# Patient Record
Sex: Female | Born: 1955 | Race: Black or African American | Hispanic: No | Smoking: Former smoker
Health system: Southern US, Community
[De-identification: ages and names within clinical notes are randomized; demographics above are authoritative.]

## PROBLEM LIST (undated history)

## (undated) DIAGNOSIS — M545 Low back pain, unspecified: Secondary | ICD-10-CM

## (undated) DIAGNOSIS — I428 Other cardiomyopathies: Secondary | ICD-10-CM

## (undated) DIAGNOSIS — I447 Left bundle-branch block, unspecified: Secondary | ICD-10-CM

## (undated) DIAGNOSIS — U071 COVID-19: Secondary | ICD-10-CM

## (undated) DIAGNOSIS — E119 Type 2 diabetes mellitus without complications: Secondary | ICD-10-CM

## (undated) DIAGNOSIS — I350 Nonrheumatic aortic (valve) stenosis: Principal | ICD-10-CM

## (undated) DIAGNOSIS — C50919 Malignant neoplasm of unspecified site of unspecified female breast: Secondary | ICD-10-CM

## (undated) DIAGNOSIS — F419 Anxiety disorder, unspecified: Secondary | ICD-10-CM

## (undated) DIAGNOSIS — G5600 Carpal tunnel syndrome, unspecified upper limb: Secondary | ICD-10-CM

## (undated) DIAGNOSIS — G629 Polyneuropathy, unspecified: Secondary | ICD-10-CM

## (undated) DIAGNOSIS — R7303 Prediabetes: Secondary | ICD-10-CM

## (undated) DIAGNOSIS — G4730 Sleep apnea, unspecified: Secondary | ICD-10-CM

## (undated) DIAGNOSIS — H269 Unspecified cataract: Secondary | ICD-10-CM

## (undated) DIAGNOSIS — I10 Essential (primary) hypertension: Secondary | ICD-10-CM

## (undated) DIAGNOSIS — I5032 Chronic diastolic (congestive) heart failure: Secondary | ICD-10-CM

## (undated) DIAGNOSIS — G4733 Obstructive sleep apnea (adult) (pediatric): Secondary | ICD-10-CM

## (undated) DIAGNOSIS — Z9989 Dependence on other enabling machines and devices: Secondary | ICD-10-CM

## (undated) DIAGNOSIS — R011 Cardiac murmur, unspecified: Secondary | ICD-10-CM

## (undated) DIAGNOSIS — J189 Pneumonia, unspecified organism: Secondary | ICD-10-CM

## (undated) DIAGNOSIS — M1990 Unspecified osteoarthritis, unspecified site: Secondary | ICD-10-CM

## (undated) DIAGNOSIS — E785 Hyperlipidemia, unspecified: Secondary | ICD-10-CM

## (undated) DIAGNOSIS — I493 Ventricular premature depolarization: Secondary | ICD-10-CM

## (undated) DIAGNOSIS — I272 Pulmonary hypertension, unspecified: Secondary | ICD-10-CM

## (undated) HISTORY — DX: Left bundle-branch block, unspecified: I44.7

## (undated) HISTORY — PX: OTHER SURGICAL HISTORY: SHX169

## (undated) HISTORY — DX: Ventricular premature depolarization: I49.3

## (undated) HISTORY — DX: Chronic diastolic (congestive) heart failure: I50.32

## (undated) HISTORY — DX: Nonrheumatic aortic (valve) stenosis: I35.0

## (undated) HISTORY — DX: Anxiety disorder, unspecified: F41.9

## (undated) HISTORY — PX: UTERINE FIBROID SURGERY: SHX826

## (undated) HISTORY — DX: COVID-19: U07.1

## (undated) HISTORY — DX: Malignant neoplasm of unspecified site of unspecified female breast: C50.919

## (undated) HISTORY — DX: Obstructive sleep apnea (adult) (pediatric): G47.33

## (undated) HISTORY — PX: CATARACT EXTRACTION W/ INTRAOCULAR LENS  IMPLANT, BILATERAL: SHX1307

## (undated) HISTORY — DX: Low back pain: M54.5

## (undated) HISTORY — DX: Sleep apnea, unspecified: G47.30

## (undated) HISTORY — PX: POLYPECTOMY: SHX149

## (undated) HISTORY — DX: Low back pain, unspecified: M54.50

## (undated) HISTORY — PX: BREAST SURGERY: SHX581

## (undated) HISTORY — DX: Type 2 diabetes mellitus without complications: E11.9

## (undated) HISTORY — DX: Pulmonary hypertension, unspecified: I27.20

## (undated) HISTORY — DX: Hyperlipidemia, unspecified: E78.5

## (undated) HISTORY — DX: Unspecified osteoarthritis, unspecified site: M19.90

## (undated) HISTORY — PX: DILATION AND CURETTAGE OF UTERUS: SHX78

## (undated) HISTORY — DX: Carpal tunnel syndrome, unspecified upper limb: G56.00

## (undated) HISTORY — DX: Prediabetes: R73.03

## (undated) HISTORY — DX: Unspecified cataract: H26.9

## (undated) HISTORY — DX: Polyneuropathy, unspecified: G62.9

## (undated) HISTORY — DX: Dependence on other enabling machines and devices: Z99.89

## (undated) HISTORY — DX: Essential (primary) hypertension: I10

---

## 1994-02-14 DIAGNOSIS — C50919 Malignant neoplasm of unspecified site of unspecified female breast: Secondary | ICD-10-CM

## 1994-02-14 HISTORY — DX: Malignant neoplasm of unspecified site of unspecified female breast: C50.919

## 1999-07-09 ENCOUNTER — Ambulatory Visit (HOSPITAL_COMMUNITY): Admission: RE | Admit: 1999-07-09 | Discharge: 1999-07-09 | Payer: Self-pay | Admitting: Family Medicine

## 1999-07-09 ENCOUNTER — Encounter: Payer: Self-pay | Admitting: Family Medicine

## 2000-06-02 ENCOUNTER — Ambulatory Visit (HOSPITAL_BASED_OUTPATIENT_CLINIC_OR_DEPARTMENT_OTHER): Admission: RE | Admit: 2000-06-02 | Discharge: 2000-06-02 | Payer: Self-pay | Admitting: Family Medicine

## 2001-10-25 ENCOUNTER — Emergency Department (HOSPITAL_COMMUNITY): Admission: EM | Admit: 2001-10-25 | Discharge: 2001-10-25 | Payer: Self-pay | Admitting: *Deleted

## 2001-10-25 ENCOUNTER — Encounter: Payer: Self-pay | Admitting: *Deleted

## 2002-12-13 ENCOUNTER — Encounter (INDEPENDENT_AMBULATORY_CARE_PROVIDER_SITE_OTHER): Payer: Self-pay | Admitting: *Deleted

## 2002-12-13 ENCOUNTER — Ambulatory Visit (HOSPITAL_COMMUNITY): Admission: AD | Admit: 2002-12-13 | Discharge: 2002-12-13 | Payer: Self-pay | Admitting: *Deleted

## 2004-02-26 ENCOUNTER — Ambulatory Visit: Payer: Self-pay | Admitting: Internal Medicine

## 2004-03-11 ENCOUNTER — Ambulatory Visit: Payer: Self-pay | Admitting: Internal Medicine

## 2004-03-22 ENCOUNTER — Encounter
Admission: RE | Admit: 2004-03-22 | Discharge: 2004-06-20 | Payer: Self-pay | Admitting: Physical Medicine & Rehabilitation

## 2004-03-22 ENCOUNTER — Ambulatory Visit: Payer: Self-pay | Admitting: Physical Medicine & Rehabilitation

## 2004-05-13 ENCOUNTER — Encounter: Admission: RE | Admit: 2004-05-13 | Discharge: 2004-05-13 | Payer: Self-pay | Admitting: Specialist

## 2004-07-22 ENCOUNTER — Encounter: Admission: RE | Admit: 2004-07-22 | Discharge: 2004-07-22 | Payer: Self-pay | Admitting: Specialist

## 2004-08-02 ENCOUNTER — Encounter: Admission: RE | Admit: 2004-08-02 | Discharge: 2004-08-02 | Payer: Self-pay | Admitting: Specialist

## 2004-08-06 ENCOUNTER — Encounter: Admission: RE | Admit: 2004-08-06 | Discharge: 2004-08-06 | Payer: Self-pay | Admitting: Specialist

## 2005-04-11 ENCOUNTER — Encounter
Admission: RE | Admit: 2005-04-11 | Discharge: 2005-07-10 | Payer: Self-pay | Admitting: Physical Medicine & Rehabilitation

## 2005-04-11 ENCOUNTER — Ambulatory Visit: Payer: Self-pay | Admitting: Physical Medicine & Rehabilitation

## 2005-06-15 ENCOUNTER — Ambulatory Visit: Payer: Self-pay | Admitting: Physical Medicine & Rehabilitation

## 2005-07-27 ENCOUNTER — Encounter
Admission: RE | Admit: 2005-07-27 | Discharge: 2005-10-25 | Payer: Self-pay | Admitting: Physical Medicine & Rehabilitation

## 2005-07-27 ENCOUNTER — Ambulatory Visit: Payer: Self-pay | Admitting: Physical Medicine & Rehabilitation

## 2005-10-04 ENCOUNTER — Ambulatory Visit: Payer: Self-pay | Admitting: Physical Medicine & Rehabilitation

## 2005-11-04 ENCOUNTER — Ambulatory Visit: Payer: Self-pay | Admitting: Physical Medicine & Rehabilitation

## 2005-11-04 ENCOUNTER — Encounter
Admission: RE | Admit: 2005-11-04 | Discharge: 2006-02-02 | Payer: Self-pay | Admitting: Physical Medicine & Rehabilitation

## 2006-02-24 ENCOUNTER — Encounter
Admission: RE | Admit: 2006-02-24 | Discharge: 2006-05-25 | Payer: Self-pay | Admitting: Physical Medicine & Rehabilitation

## 2006-03-06 ENCOUNTER — Ambulatory Visit: Payer: Self-pay | Admitting: Physical Medicine & Rehabilitation

## 2006-04-17 ENCOUNTER — Ambulatory Visit: Payer: Self-pay | Admitting: Physical Medicine & Rehabilitation

## 2006-06-16 ENCOUNTER — Encounter
Admission: RE | Admit: 2006-06-16 | Discharge: 2006-09-14 | Payer: Self-pay | Admitting: Physical Medicine & Rehabilitation

## 2006-06-19 ENCOUNTER — Ambulatory Visit: Payer: Self-pay | Admitting: Physical Medicine & Rehabilitation

## 2006-08-01 ENCOUNTER — Ambulatory Visit (HOSPITAL_COMMUNITY)
Admission: RE | Admit: 2006-08-01 | Discharge: 2006-08-01 | Payer: Self-pay | Admitting: Physical Medicine & Rehabilitation

## 2006-08-07 ENCOUNTER — Ambulatory Visit: Payer: Self-pay | Admitting: Physical Medicine & Rehabilitation

## 2007-04-02 ENCOUNTER — Encounter
Admission: RE | Admit: 2007-04-02 | Discharge: 2007-07-01 | Payer: Self-pay | Admitting: Physical Medicine & Rehabilitation

## 2007-04-02 ENCOUNTER — Ambulatory Visit: Payer: Self-pay | Admitting: Physical Medicine & Rehabilitation

## 2007-05-29 ENCOUNTER — Ambulatory Visit: Payer: Self-pay | Admitting: Physical Medicine & Rehabilitation

## 2007-07-27 ENCOUNTER — Encounter
Admission: RE | Admit: 2007-07-27 | Discharge: 2007-08-28 | Payer: Self-pay | Admitting: Physical Medicine & Rehabilitation

## 2007-07-30 ENCOUNTER — Ambulatory Visit: Payer: Self-pay | Admitting: Physical Medicine & Rehabilitation

## 2007-08-20 ENCOUNTER — Ambulatory Visit (HOSPITAL_COMMUNITY)
Admission: RE | Admit: 2007-08-20 | Discharge: 2007-08-20 | Payer: Self-pay | Admitting: Physical Medicine & Rehabilitation

## 2007-08-28 ENCOUNTER — Ambulatory Visit: Payer: Self-pay | Admitting: Physical Medicine & Rehabilitation

## 2007-10-19 ENCOUNTER — Encounter
Admission: RE | Admit: 2007-10-19 | Discharge: 2007-10-23 | Payer: Self-pay | Admitting: Physical Medicine & Rehabilitation

## 2007-10-23 ENCOUNTER — Ambulatory Visit: Payer: Self-pay | Admitting: Physical Medicine & Rehabilitation

## 2007-12-24 ENCOUNTER — Encounter
Admission: RE | Admit: 2007-12-24 | Discharge: 2008-03-23 | Payer: Self-pay | Admitting: Physical Medicine & Rehabilitation

## 2007-12-24 ENCOUNTER — Ambulatory Visit: Payer: Self-pay | Admitting: Physical Medicine & Rehabilitation

## 2008-01-21 ENCOUNTER — Ambulatory Visit: Payer: Self-pay | Admitting: Physical Medicine & Rehabilitation

## 2010-02-16 ENCOUNTER — Encounter (INDEPENDENT_AMBULATORY_CARE_PROVIDER_SITE_OTHER): Payer: Self-pay | Admitting: *Deleted

## 2010-03-03 ENCOUNTER — Encounter (INDEPENDENT_AMBULATORY_CARE_PROVIDER_SITE_OTHER): Payer: Self-pay | Admitting: *Deleted

## 2010-03-07 ENCOUNTER — Encounter: Payer: Self-pay | Admitting: Family Medicine

## 2010-03-18 NOTE — Letter (Signed)
Summary: New Patient letter  [facility]  [address] [place], [place] [address]   Phone: [phone]  Fax: [phone]       03/03/2010 MRN: [id number]  [facility] [address] [place], [place]  [address]  Dear [name],  Welcome to the Gastroenterology Division at [facility].    You are scheduled to see Dr.  Lina Sar on April 19, 2010 at 9:15am on the 3rd floor at [facility], [address]  We ask that you try to arrive at our office 15 minutes prior to your appointment time to allow for check-in.  We would like you to complete the enclosed self-administered evaluation form prior to your visit and bring it with you on the day of your appointment.  We will review it with you.  Also, please bring a complete list of all your medications or, if you prefer, bring the medication bottles and we will list them.  Please bring your insurance card so that we may make a copy of it.  If your insurance requires a referral to see a specialist, please bring your referral form from your primary care physician.  Co-payments are due at the time of your visit and may be paid by cash, check or credit card.     Your office visit will consist of a consult with your physician (includes a physical exam), any laboratory testing he/she may order, scheduling of any necessary diagnostic testing (e.g. x-ray, ultrasound, CT-scan), and scheduling of a procedure (e.g. Endoscopy, Colonoscopy) if required.  Please allow enough time on your schedule to allow for any/all of these possibilities.    If you cannot keep your appointment, please call [phone] to cancel or reschedule prior to your appointment date.  This allows [place] the opportunity to schedule an appointment for another patient in need of care.  If you do not cancel or reschedule by 5 p.m. the business day prior to your appointment date, you will be charged a $50.00 late cancellation/no-show fee.    Thank you for choosing  [facility] for your medical needs.  We appreciate the opportunity to care for you.  Please visit [place] at our website  to learn more about our practice.                     Sincerely,                                                             The Gastroenterology Division

## 2010-03-18 NOTE — Letter (Signed)
Summary: Office Visit Letter  [facility]  [address] [place], [place] [address]   Phone: [phone]  Fax: [phone]      February 16, 2010 MRN: [id number]   [facility] [address] [place], [place]  [address]   Dear Ms. Mcmains,   According to our records, it is time for you to schedule a follow-up office visit with [place].   At your convenience, please call [phone] (option #2)to schedule an office visit. If you have any questions, concerns, or feel that this letter is in error, we would appreciate your call.   Sincerely,  Hedwig Morton. Juanda Chance, M.D  [facility] Division [phone]

## 2010-04-15 ENCOUNTER — Telehealth: Payer: Self-pay | Admitting: Internal Medicine

## 2010-04-15 DIAGNOSIS — Z853 Personal history of malignant neoplasm of breast: Secondary | ICD-10-CM | POA: Insufficient documentation

## 2010-04-15 DIAGNOSIS — E7800 Pure hypercholesterolemia, unspecified: Secondary | ICD-10-CM | POA: Insufficient documentation

## 2010-04-15 DIAGNOSIS — G5600 Carpal tunnel syndrome, unspecified upper limb: Secondary | ICD-10-CM | POA: Insufficient documentation

## 2010-04-15 DIAGNOSIS — E782 Mixed hyperlipidemia: Secondary | ICD-10-CM | POA: Insufficient documentation

## 2010-04-16 ENCOUNTER — Encounter (INDEPENDENT_AMBULATORY_CARE_PROVIDER_SITE_OTHER): Payer: Self-pay | Admitting: *Deleted

## 2010-04-19 ENCOUNTER — Encounter: Payer: Self-pay | Admitting: Internal Medicine

## 2010-04-19 ENCOUNTER — Encounter: Payer: Self-pay | Admitting: *Deleted

## 2010-04-19 ENCOUNTER — Ambulatory Visit: Payer: Self-pay | Admitting: Internal Medicine

## 2010-04-22 NOTE — Progress Notes (Signed)
Summary: Direct Colon Vs. Office Visit  Phone Note Outgoing Call   Call placed by: Lamona Curl CMA Duncan Dull),  April 15, 2010 5:02 PM Call placed to: Patient Summary of Call: Called patient. She is on schedule for 04/19/10 to see Dr Juanda Chance for recall colonoscopy. Patient is 55 years old and has no major medical problems. She is on no anticoagulants, no insulin and she has no gi symptoms. Patient is coming because she is due for colonoscopy (her last one was in 2006) as her mother had colon cancer. Per Dr Juanda Chance, patient may be a direct colonoscopy unless she just wants to be seen in the office first. Patient would rather have direct procedure. Patient has been scheduled for previsit on 04/19/10 and colonoscopy on 05/18/10. Office appointment for 04/19/10 with Dr Juanda Chance has been cancelled. Initial call taken by: Lamona Curl CMA (AAMA),  April 15, 2010 5:04 PM

## 2010-04-22 NOTE — Procedures (Signed)
Summary: COLON   Colonoscopy  Procedure date:  03/11/2004  Findings:      Location:  [facility].   Patient Name: Joanna, Peck MRN:  Procedure Procedures: Colonoscopy CPT: [id number].  Personnel: Endoscopist: Dora L. Juanda Chance, MD.  Referred By: Elias Else, MD.  Exam Location: Exam performed in Outpatient Clinic. Outpatient  Patient Consent: Procedure, Alternatives, Risks and Benefits discussed, consent obtained, from patient. Consent was obtained by the RN.  Indications  Average Risk Screening Routine.  History  Current Medications: Patient is not currently taking Coumadin.  Pre-Exam Physical: Performed Mar 11, 2004. Entire physical exam was normal.  Exam Exam: Extent of exam reached: Cecum, extent intended: Cecum.  The cecum was identified by appendiceal orifice and IC valve. Colon retroflexion performed. Images taken. ASA Classification: I. Tolerance: good.  Monitoring: Pulse and BP monitoring, Oximetry used. Supplemental O2 given.  Colon Prep Used Miralax for colon prep. Prep results: good.  Sedation Meds: Patient assessed and found to be appropriate for moderate (conscious) sedation. Fentanyl 100 mcg. given IV. Versed 10 mg. given IV.  Findings - NORMAL EXAM: Cecum.   Assessment Normal examination.  Comments: no polyps Events  Unplanned Interventions: No intervention was required.  Unplanned Events: There were no complications. Plans Patient Education: Patient given standard instructions for: Yearly hemoccult testing recommended. Patient instructed to get routine colonoscopy every 10 years.  Disposition: After procedure patient sent to recovery. After recovery patient sent home.   This report was created from the original endoscopy report, which was reviewed and signed by the above listed endoscopist.

## 2010-04-27 NOTE — Miscellaneous (Signed)
Summary: [facility]  Clinical Lists Changes  Medications: Added new medication of MOVIPREP 100 GM  SOLR (PEG-KCL-NACL-NASULF-NA ASC-C) As per prep instructions. - Signed Rx of MOVIPREP 100 GM  SOLR (PEG-KCL-NACL-NASULF-NA ASC-C) As per prep instructions.;  #1 x 0;  Signed;  Entered by: Ezra Sites RN;  Authorized by: Hart Carwin MD;  Method used: Electronically to [facility] [address] [place], [place], [place]  [address], Ph: [phone], Fax: [phone] Allergies: Changed allergy or adverse reaction from PRAVACHOL to PRAVACHOL    Prescriptions: MOVIPREP 100 GM  SOLR (PEG-KCL-NACL-NASULF-NA ASC-C) As per prep instructions.  #1 x 0   Entered by:   Ezra Sites RN   Authorized by:   Hart Carwin MD   Signed by:   Ezra Sites RN on 04/19/2010   Method used:   Electronically to        [facility] (retail)       [address]       [place], [place]  [address]       Ph: [phone]       Fax: [phone]   RxID:   [id number]

## 2010-04-27 NOTE — Letter (Signed)
Summary: [facility] Instructions  [facility]  [address] [place], [place] [address]   Phone: [phone]  Fax: [phone]       [name]    05/03/1955    MRN: [id number]        Procedure Day /Date:  Thursday 05/20/2010     Arrival Time: 10:00 am     Procedure Time: 11:00 am     Location of Procedure:                    _x _  [facility] (4th Floor)   PREPARATION FOR COLONOSCOPY WITH MOVIPREP   Starting 5 days prior to your procedure March 31 Saturday do not eat nuts, seeds, popcorn, corn, beans, peas,  salads, or any raw vegetables.  Do not take any fiber supplements (e.g. Metamucil, Citrucel, and Benefiber).  THE DAY BEFORE YOUR PROCEDURE         DATE: Wednesday 4/4 1.  Drink clear liquids the entire day-NO SOLID FOOD  2.  Do not drink anything colored red or purple.  Avoid juices with pulp.  No orange juice.  3.  Drink at least 64 oz. (8 glasses) of fluid/clear liquids during the day to prevent dehydration and help the prep work efficiently.  CLEAR LIQUIDS INCLUDE: Water Jello Ice Popsicles Tea (sugar ok, no milk/cream) Powdered fruit flavored drinks Coffee (sugar ok, no milk/cream) Gatorade Juice: apple, white grape, white cranberry  Lemonade Clear bullion, consomm, broth Carbonated beverages (any kind) Strained chicken noodle soup Hard Candy                             4.  In the morning, mix first dose of MoviPrep solution:    Empty 1 Pouch A and 1 Pouch B into the disposable container    Add lukewarm drinking water to the top line of the container. Mix to dissolve    Refrigerate (mixed solution should be used within 24 hrs)  5.  Begin drinking the prep at 5:00 p.m. The MoviPrep container is divided by 4 marks.   Every 15 minutes drink the solution down to the next mark (approximately 8 oz) until the full liter is complete.   6.  Follow completed prep with 16 oz of clear liquid of your choice (Nothing red or purple).   Continue to drink clear liquids until bedtime.  7.  Before going to bed, mix second dose of MoviPrep solution:    Empty 1 Pouch A and 1 Pouch B into the disposable container    Add lukewarm drinking water to the top line of the container. Mix to dissolve    Refrigerate  THE DAY OF YOUR PROCEDURE      DATE: Thursday 4/5  Beginning at 6:00 a.m. (5 hours before procedure):         1. Every 15 minutes, drink the solution down to the next mark (approx 8 oz) until the full liter is complete.  2. Follow completed prep with 16 oz. of clear liquid of your choice.    3. You may drink clear liquids until 9:00 am (2 HOURS BEFORE PROCEDURE).   MEDICATION INSTRUCTIONS  Unless otherwise instructed, you should take regular prescription medications with a small sip of water   as early as possible the morning of your procedure.           OTHER INSTRUCTIONS  You will need a responsible adult at  least 56 years of age to accompany you and drive you home.   This person must remain in the waiting room during your procedure.  Wear loose fitting clothing that is easily removed.  Leave jewelry and other valuables at home.  However, you may wish to bring a book to read or  an iPod/MP3 player to listen to music as you wait for your procedure to start.  Remove all body piercing jewelry and leave at home.  Total time from sign-in until discharge is approximately 2-3 hours.  You should go home directly after your procedure and rest.  You can resume normal activities the  day after your procedure.  The day of your procedure you should not:   Drive   Make legal decisions   Operate machinery   Drink alcohol   Return to work  You will receive specific instructions about eating, activities and medications before you leave.    The above instructions have been reviewed and explained to me by   Ezra Sites RN  April 19, 2010 9:16 AM    I fully understand and can verbalize these  instructions _____________________________ Date _________

## 2010-05-18 ENCOUNTER — Other Ambulatory Visit: Payer: Self-pay | Admitting: Internal Medicine

## 2010-05-19 ENCOUNTER — Encounter: Payer: Self-pay | Admitting: Internal Medicine

## 2010-05-20 ENCOUNTER — Ambulatory Visit (AMBULATORY_SURGERY_CENTER): Payer: BC Managed Care – PPO | Admitting: Internal Medicine

## 2010-05-20 ENCOUNTER — Encounter: Payer: Self-pay | Admitting: Internal Medicine

## 2010-05-20 DIAGNOSIS — D126 Benign neoplasm of colon, unspecified: Secondary | ICD-10-CM

## 2010-05-20 DIAGNOSIS — Z800 Family history of malignant neoplasm of digestive organs: Secondary | ICD-10-CM

## 2010-05-20 DIAGNOSIS — Z1211 Encounter for screening for malignant neoplasm of colon: Secondary | ICD-10-CM

## 2010-05-20 DIAGNOSIS — Z860100 Personal history of colon polyps, unspecified: Secondary | ICD-10-CM

## 2010-05-20 DIAGNOSIS — R6889 Other general symptoms and signs: Secondary | ICD-10-CM

## 2010-05-20 MED ORDER — SODIUM CHLORIDE 0.9 % IV SOLN: 500.0000 mL | INTRAVENOUS | Status: DC

## 2010-05-20 NOTE — Patient Instructions (Signed)
See green and blue sheets for d/c instructions.

## 2010-05-24 ENCOUNTER — Telehealth: Payer: Self-pay

## 2010-05-24 NOTE — Telephone Encounter (Signed)

## 2010-05-27 ENCOUNTER — Encounter: Payer: Self-pay | Admitting: Internal Medicine

## 2010-06-29 NOTE — Assessment & Plan Note (Signed)
Ms. Tripoli returns today.  I last saw her on August 07, 2006 at which time  I did left foot Morton's neuroma  injection.  In the interval time she  has had recurrence of right-sided low back pain.  Her last L1-L2, L3  medial branch blocks under fluoroscopic guidance were performed on Jun 19, 2006.  She has also had recurrence of right hand tingling and  numbness. She has had an electrodiagnostic study demonstrating carpal-  tunnel syndrome and has had good results in tone recently from the  carpal-tunnel injection performed April 25, 2006.  She has had some  recurrence of her left second and toe pain. She has had previous relief  with the Morton's neuroma  injections August 07, 2006.   Her pain is listed now as a 5/10.  It interferes with the general  activity at a moderate level and enjoyment of life at a more significant  level. Her pain is worse during the night time.  Sleep is poor.  The  pain is worse with bending in regards to her back.  It improves with  rest, heat, medications as well as injections.  She can walk 20 minutes  at a time.  She climbs steps. She drives.  She works 40 hours a week.  She has numbness in the right hand, trouble walking due to her foot and  anxiety over all.   Blood pressure 123/69.  Pulse 82.  Respirations  18.  O2 SAT 97 percent  on room air.  Obesity.  In no acute distress.  Her neck has full range of motion.  Manual muscle testing reveals 5/5 strength in the deltoid, biceps,  triceps, grip as well as APB and in the lower extremities 5/5, hip  flexors, knee extensors, ankle dorsiflexors.  Examination of the extremities  shows normal  range of motion as well as  joint stability in bilateral shoulders and elbows, wrists, as well as  hips, knees, and ankles.  She has negative reverse Phalen's at the  wrist.  Negative Tinel's at the wrists.  Positive pain to palpation  between the base of the second and third toes on the left side.  There  is no evidence of  upper or lower extremity edema. She has normal pulses.  Deep tendon reflexes are normal, 2+ at the biceps, triceps, brachial  radialis , Achilles, and parapatellar. Sensation is reduced right index  finger, otherwise intact in the C6, 7, 8 and T1  dermatomes and in the  lower extremities at the L2, 3, 4, 5 and  S1 dermatomes.  Her spine  range of motion is reduced in extension to 25 percent of normal range.  Forward flexion is normal.  Neck range of motion is normal.   IMPRESSION:  1. Lumbar facet syndrome recurrence rather prolonged effect from      lumbar medial branch block.  Will repeat.  2. Right carpal-tunnel syndrome, recurrent.  Will reinject today.  3. Left Morton's neuroma. This is not quite as bad in terms of      intensity as the other problems and will restart Anaprox 275 b.i.d.      She will need to hold this medication for 5 day prior to spine      injection.   If she does not respond well, like she did last time, to the carpal-  tunnel injection with repeat electrodiagnostic studies to see whether  there is any progression since 2006 and then consider surgical  consultation.  Erick Colace, M.D.  Electronically Signed     AEK/MedQ  D:  04/04/2007 12:01:39  T:  04/05/2007 07:06:10  Job #:  [id number]   cc:   Kerrin Champagne, M.D.  Fax: [phone]   Elana Alm. Nicholos Johns, M.D.  Fax: [phone]

## 2010-06-29 NOTE — Procedures (Signed)
NAMEDISHA, COTTAM NO.:  [id number]   MEDICAL RECORD NO.:  [id number]          PATIENT TYPE:  OUT   LOCATION:  XRAY                         FACILITY[facility]  [facility]   PHYSICIAN:  Erick Colace, M.D.DATE OF BIRTH:  [birth year]   DATE OF PROCEDURE:  08/28/2007  DATE OF DISCHARGE:  08/20/2007                               OPERATIVE REPORT   PROCEDURE:  Left first metatarsophalangeal joint aspiration.  Area  marked, prepped with Betadine and alcohol.   Informed consent was obtained after describing risks and benefits of the  procedure including bleeding, bruising, and infection.  She elects to  proceed.  Area marked and prepped with Betadine.  Dorsal approach  utilized just medial to midline and medial to the EHL tendon.  A 27-  gauge 5-1/8-inch needle was used to inject 1% lidocaine x1 mL  infiltrating around the premarked area.  Then, a 22-gauge 1-1/2-inch  needle was inserted into the joint.  No significant amount of joint  fluid was aspirated, therefore 1.5 mL of 0.9 normal saline was injected  and then reaspirated and this aspirate was sent for analysis.  The  patient tolerated the procedure well.  Dressing applied. Post-injection  instructions given.      Erick Colace, M.D.  Electronically Signed     AEK/MEDQ  D:  08/28/2007 10:50:46  T:  08/28/2007 47:82:95  Job:  [id number]

## 2010-06-29 NOTE — Assessment & Plan Note (Signed)
[name] returns today.  She had a right T12, L1 and L2  medial branch  block under fluoroscopic guidance.  She has had prior 10 months relief  with medial branch blocks although she states that in truth her pain  started returning a bit earlier; she may have waited a bit too long,  perhaps a month or 2 prior to getting repeat injection.  She is quite  happy right now with her pain control.  She is back to gardening as well  as taking care of her elderly parents.  She has 0/10 pain.  She can walk  20 minutes at a time.  She works 40 hours a week.  She has some numbness  in her left toes as well as in the hand intermittently although overall  her hand pain and tingling have improved after carpal tunnel injection  on the right.   Her blood pressure is 138/66, pulse 87, respiratory rate 18, 02  saturation 96% room air. no acute distress.  In general, in no acute distress, mood and affect appropriate.  Her back has full range of motion with flexion, extension, lateral  rotation, and bending.  She has full strength in bilateral lower  extremities, normal range of motion in bilateral lower extremities,  normal deep tendon reflexes in bilateral lower extremities.  She has no  evidence of peripheral edema. She has good peripheral pulses.  Normal  sensation.   IMPRESSION:  Upper lumbar facet syndrome improved once again by medial  branch block.   PLAN:  We will see her back in around 2 months to see how she is doing  overall.  We may need to repeat medial branch block in another 6 months  or so based on her prior response.  Given that she had such a prolonged  response with the branch block, would not recommend radiofrequency at  this time.      Erick Colace, M.D.  Electronically Signed     AEK/MedQ  D:  05/29/2007 16:34:02  T:  05/29/2007 18:02:01  Job #:  [id number]

## 2010-06-29 NOTE — Procedures (Signed)
NAMECARALYN, TWINING                ACCOUNT NO.:  [id number]   MEDICAL RECORD NO.:  [id number]          PATIENT TYPE:  REC   LOCATION:  [facility]                          FACILITY:  [facility]   PHYSICIAN:  Erick Colace, M.D.DATE OF BIRTH:  08/23/1955   DATE OF PROCEDURE:  DATE OF DISCHARGE:                               OPERATIVE REPORT   Ms. Allers returns today with increased wrist pain, last carpal tunnel  injection performed in February 2009, and has had recurrence right wrist  pain radiating up towards the shoulder, no neck pain.  Pain persists  despite using splint.   Informed consent obtained after describing risks and benefits of the  procedure with the patient.  These include bleeding, bruising, and  infection.  She elected to proceed and has given written consent.  The  patient in a seated position.  The area between palmaris longus and  flexor carpi radialis tendon was marked, prepped with Betadine, and  entered with 27-gauge 5/8-inch needle.  Skin wheal raised with 1%  lidocaine x 0.25 mL followed by injection of 0.25 mL of a 40 mg/mL Depo-  Medrol solution.  The patient tolerated the procedure well.  Post-  procedure instructions given.      Erick Colace, M.D.  Electronically Signed     AEK/MEDQ  D:  07/30/2007 11:45:18  T:  07/31/2007 00:44:19  Job:  [id number]

## 2010-06-29 NOTE — Assessment & Plan Note (Signed)
[name] returns today.  She was last seen by me on August 28, 2007.  She  had left first metatarsophalangeal joint aspiration, which  yielded no  crystals.  She has had no new problems other than increasing pain in the  left knee.  She does not have any trauma to the knee, it came on by  itself, and she has had no swelling.  Her mother, who is 56 years old,  is requiring increasing amount of care.  The patient also lost her  father to cancer on September 19, 2007.   Her average pain is 6/10 both in the back as well as in the left knee.  She has some left wrist pain as well.  Her pain inhibits her activity at  a 7/10 level.  Sleep is fair.  Due to pain, she has some numbness and  tingling in the left hand, limb swelling in the feet, and sleep apnea  problems.   PHYSICAL EXAMINATION:  VITAL SIGNS:  Her blood pressure is 133/77, pulse  90, respirations 18, and O2 sat 95% on room air.  GENERAL:  No acute distress.  Mood and affect is appropriate.  BACK:  Some tenderness to palpation on the right-sided lumbar area above  the iliac crest.  In the left knee, she has no evidence of effusion.  No  evidence of erythema.  She has good range of motion, although it does  hurt when she flexes her knee.  She has no pain in the popliteal fossa.  No pain along the biceps or semitendinosus tendons.  She has some pain  anteriorly in the patellar tendon region as well as laterally just  lateral to the patella tendon along the joint line on the left side.   Deep tendon reflexes are hyperreflexic, but symmetric, bilateral lower  extremities even with facilitation.   IMPRESSION:  1. Lumbar facet syndrome.  She is having some exacerbation of her      back, I believe, at the time again for a medial branch block.  She      gets really prolonged effect from injections.  Therefore, I have      not been doing radiofrequency procedures.  She has had about 6      months' relief.  2. Left knee pain, appears to be  articular cartilage versus patellar      tendinitis.  We will start her on Anaprox 375 b.i.d.  3. In terms of her knee, if she has continued pain, we will check x-      rays.  Of note is that she states she took one of her mother's      Duragesic 12.5 patch and put it on her knee and stated within 2-3      minutes, she had good relief of pain.  I indicated that was      illegal.  Fortunately, she is not taking any other controlled      substances, and I am really not treating her that way.  I have told      her not to do that again.  She understands that if she has      increased knee pain, she is to call our office.      Erick Colace, M.D.  Electronically Signed     AEK/MedQ  D:  10/23/2007 10:49:18  T:  10/24/2007 00:59:51  Job #:  [id number]

## 2010-06-29 NOTE — Procedures (Signed)
NAMETEAUNA, DUBACH                ACCOUNT NO.:  [id number]   MEDICAL RECORD NO.:  [id number]          PATIENT TYPE:  REC   LOCATION:  [facility]                          FACILITY:  [facility]   PHYSICIAN:  Erick Colace, M.D.DATE OF BIRTH:  1955-09-24   DATE OF PROCEDURE:  08/07/2006  DATE OF DISCHARGE:                               OPERATIVE REPORT   PROCEDURE:  Left foot Morton's neuroma injection.   INDICATION:  Pain with weightbearing.  X-rays show no signs of  metatarsal fracture or significant metatarsal degenerative changes.   Informed consent was obtained after describing the risks and benefits of  the procedure.  The patient elects to proceed.  Also discussed the  possibility of needing reinjection.   Area between the second and third dorsal web marked, prepped with  Betadine, sprayed with methyl fluoride and entered with 27 gauge 5/8 in  needle after negative draw-back for blood.  A solution containing 1 mL  of 40 mg/mL Depo-Medrol and 2 mL of 1% lidocaine were infiltrated.  The  patient tolerated the procedure well.  Postinjection pain level is 0/10.  She will return in 1 month.      Erick Colace, M.D.  Electronically Signed     AEK/MEDQ  D:  08/07/2006 08:47:34  T:  08/07/2006 09:20:18  Job:  [id number]

## 2010-06-29 NOTE — Procedures (Signed)
[name], [name]                ACCOUNT NO.:  [id number]   MEDICAL RECORD NO.:  [id number]          PATIENT TYPE:  REC   LOCATION:  [facility]                          FACILITY:  [facility]   PHYSICIAN:  Erick Colace, M.D.DATE OF BIRTH:  09/09/1955   DATE OF PROCEDURE:  04/04/2007  DATE OF DISCHARGE:                               OPERATIVE REPORT   PROCEDURE:  Right carpal tunnel injection.   INDICATIONS:  Carpal tunnel syndrome diagnosed by EMG previously  relieved by carpal tunnel injection done over one year ago, recurrence  of symptoms, persistent despite a splint use.   PROCEDURE IN DETAIL:  Informed consent was obtained after describing  risks and benefits of the procedure to the patient.  These include  bleeding, bruising, infection, and she elects proceed and has written  consent  With the patient in the seated position, the right wrist was  marked and prepped with Betadine and a skin wheal raised with a 27 gauge  5/8 inch needle with 0.5 mL of 1% lidocaine followed by insertion of a  second 27 gauge 5/8 inch needle into the carpal tunnel.  0.25 mL of 40  mg/mL Depo-Medrol injected.  The patient tolerated the procedure well.  Post injection instructions given.      Erick Colace, M.D.  Electronically Signed     AEK/MEDQ  D:  04/04/2007 10:30:44  T:  04/04/2007 04:54:09  Job:  [id number]

## 2010-06-29 NOTE — Procedures (Signed)
[name], [name]                ACCOUNT NO.:  [id number]   MEDICAL RECORD NO.:  [id number]           PATIENT TYPE:   LOCATION:                                 FACILITY:   PHYSICIAN:  Erick Colace, M.D.DATE OF BIRTH:  01/08/1956   DATE OF PROCEDURE:  DATE OF DISCHARGE:                               OPERATIVE REPORT   INDICATIONS:  1. Right carpal tunnel syndrome.  Pain is only partially relieved by      oral medications, has responded very well in the past.  2. Carpal tunnel injection, pain does keep her up at night.   Last carpal tunnel injection done 6 months ago.   Informed consent was obtained after describing risks and benefits of  procedure with the patient.  These include bleeding, bruising,  infection.  She elects to proceed.  A 27-gauge, 5/8th inch needle was  used to anesthetize the skin and subcu, 1% lidocaine x0.25 mL, then a  separate 27-gauge needle was utilized for the injection after negative  drawback of blood, 0.25 mL of 40 mg/mL Depo-Medrol was injected.  The  patient tolerated the procedure well, sterile technique utilized.  Band-  Aid applied.  Post injection instructions given.      Erick Colace, M.D.  Electronically Signed     AEK/MEDQ  D:  01/21/2008 09:57:51  T:  01/21/2008 23:42:46  Job:  [id number]

## 2010-06-29 NOTE — Assessment & Plan Note (Signed)
[name] returns today.  She follows up after right-sided T12, L1, and  L2 medial branch blocks.  Her knee pain has improved.  She had good  relief of the low back pain.  She has had knee pain, but this improved  about 2 weeks after the injection.   She had no other medical problems that are new other than her right  wrist is hurting a lot, keeping her up at night, and having tingling and  numbness in the hands.  Her last carpel tunnel injection was in June  2009.  She has had some Anaprox as well as Tylenol, but really no other  medications.   Her pain is described as sharp, constant, tingling.   In terms of her back, her Oswestry index was completed, 22% today, which  is stable.   Her questionnaire was influenced by her sleep disturbance from the wrist  and the pain that recurs from the wrist.   EXAMINATION:  Her left knee has no tenderness to palpation.  She states  typically it hurts below the patella and in the hamstring region  medially.   She has a negative Phalen's, negative Tinel's, and negative sensory  deficits in the upper extremities.  She has good grip.  Injection sites  are healed.  No tenderness to palpation in lumbar paraspinals.  No pain  with lumbar range of motion.   IMPRESSION:  1. Lumbar facet syndrome, improved after medial branch blocks on the      right side.  2. Left knee pain, resolved spontaneously.  The knee x-rays are really      showing just in patellofemoral osteoarthritis, and I think that she      also has patellar tenonitis, which comes and goes associated with      going up and down steps a lot.  3. Right wrist pain with hand tingling.  This appears to be due to      recurrence of her carpel tunnel, will be injecting her given that      it has been 6 months.      Erick Colace, M.D.  Electronically Signed     AEK/MedQ  D:  01/21/2008 09:42:28  T:  01/22/2008 02:01:38  Job #:  [id number]

## 2010-06-29 NOTE — Assessment & Plan Note (Signed)
[name] returns today.  She last saw me, May 29, 2007.  She states  her back is doing okay.  She has had medial branch block in March 2009  which helped with her back pain.  However, she is having a flare up of  the right upper extremity pain.  This is both in the wrists as well as  numbness in the hands.  She does have pain in the shoulders as well.  However, she denies any neck pain.  Her pain is about 3/10, but  interferes with activity at a 8/10 level.  In addition, she has left  foot swelling and left foot pain.  She has had an x-ray about a year ago  that showed left first MTP arthritis, but no other significant  abnormalities.  Her pain got better for a while, but now has been  worsening again.  She continues to be employed 40 hours a week.  She has  had no fevers.  She has had no trauma.   Her blood pressure is 121/62, pulse 87, respiration 18, and O2 sat 98%  on room air.  GENERAL:  In no acute distress.  Mood and affect appropriate.  BACK:  Her back has no pain with range of motion.  Her right wrist has  no evidence of swelling.  She has good range of motion and good grip  strength.   Her lower extremity strength is normal.  She has normal pedal and  posterior tibial pulses.  Her left foot has 1+ pedal edema compared to  the right side.  She has no joint swelling.  No hypersensitivity to  touch.  She has good ankle range of motion.   IMPRESSION:  1. Recurrence of right carpal tunnel syndrome.  2. Lumbar facet arthropathy, improved after medial branch block.  3. Left foot pain and swelling.  It does not appear to be an arterial      problem, but it could be venous problem causing some venous stasis.      In addition, her pain could be somewhat related to swelling.      Although, this is not typical, may have some concomitant problems      such as a peroneal neuropathy or even a lumbar radiculopathy, does      not appear to be in a joint, however, this is on the  differential.      We will recheck a foot x-ray, and if negative, check some venous      studies in left lower extremity.      Erick Colace, M.D.  Electronically Signed     AEK/MedQ  D:  07/30/2007 11:48:51  T:  07/31/2007 02:00:00  Job #:  [id number]

## 2010-06-29 NOTE — Procedures (Signed)
[name], [name]                ACCOUNT NO.:  [id number]   MEDICAL RECORD NO.:  [id number]         PATIENT TYPE:  [facility]   LOCATION:                                 FACILITY:   PHYSICIAN:  Erick Colace, M.D.DATE OF BIRTH:  11/27/1955   DATE OF PROCEDURE:  DATE OF DISCHARGE:                               OPERATIVE REPORT   PROCEDURE:  T12 medial branch block, L1-L2 medial branch block under  fluoroscopic guidance.   INDICATIONS:  Lumbar pain, low back pain.  Pain is only partially  responsive to medication management.  She has had previous good relief  for prolonged period time, i.e., greater than 3 months after injection.   Informed consent was obtained after describing risks and benefits of the  procedure with the patient.  These include bleeding, bruising,  infection.  She elects to proceed and has given written consent.  The  patient placed prone on fluoroscopy table.  Betadine prep, sterile  drape.  A 25-gauge inch and a half needle was used to anesthetize the  skin and subcu tissue, 1% lidocaine x2 mL.  Then, a 22-gauge 3-1/2-inch  spinal needle was inserted first targeting the right L1 SAP transverse  process junction, bone contact made, confirmed with lateral imaging.  Omnipaque 180 x0.5 mL demonstrated no intravascular uptake.  Then, 0.5  mL of a dexamethasone-lidocaine solution was injected.  Then, the right  L2 SAP transverse process junction targeted, bone contact made,  confirmed with lateral imaging.  Omnipaque 180 x0.5 mL demonstrated no  intravascular uptake and 0.5 mL of dexamethasone-lidocaine solution was  injected.  Then, the left L3 SAP transverse process junction targeted,  bone contact made, confirmed with lateral imaging.  Omnipaque 180 x0.5  mL demonstrated no intravascular uptake.  Then, 0.5 mL dexamethasone-  lidocaine solution was injected.  The patient tolerated the procedure  well.  Pre and post injection vitals stable.  Post injection  instructions given.      Erick Colace, M.D.  Electronically Signed     AEK/MEDQ  D:  12/24/2007 09:55:25  T:  12/25/2007 00:14:45  Job:  [id number]

## 2010-06-29 NOTE — Assessment & Plan Note (Signed)
HISTORY:  The patient is a 55 year old female with lumbar facet  syndrome, spondylosis with myelopathy, who has responded to medial  branch blocks T12, L1, and L2, right side.  She had continued good  relief after her lumbar injections.  She had a flare-up of her carpal  tunnel symptomatology on the right side and responded well to a carpal  tunnel injection performed on July 31, 2007.  Her main complaint at the  current time is left foot swelling.  She has some pain with activity  regards to this, but it does come and go at times as well.   We did do x-rays of her left foot, which showed no bony lesions other  than a plantar heel spur.  She does not have any heel pain or plantar  surface pain.   REVIEW OF SYSTEMS:  Positive for numbness and anxiety.   PHYSICAL EXAMINATION:  VITAL SIGNS:  Blood pressure 145/83, pulse 98,  respiratory rate 20, and O2 sat 93% on room air.  GENERAL:  She is an overweight female in no acute distress.  Orientation  x3.  Affect is alert.  EXTREMITIES:  Gait is without limp, favoring the left lower extremity.  She has no tenderness of the lumbar spine.  She has good range of motion  in the lower extremities.  She has some swelling, left MTP and proximal  foot, going up as high as the ankle.  Calf circumferences are equal.  There is no calf tenderness.   Sensation is normal in the lower extremities.  She has no pain over the  metatarsals.   IMPRESSION:  Intermittent left foot swelling and pain, question whether  she may have gout, denies history any history of this.  She does have  some residual edema in the foot and ankle area, and there is no sign of  infection or any sign of more generalized peripheral edema.  Her pulses  are good.   PLAN:  We will do arthrocentesis and ask some joint fluid for crystals.   I will see her back in 1-2 months to follow up on this.  If crystals are  present, we will discuss further treatment plans.      Erick Colace, M.D.  Electronically Signed     AEK/MedQ  D:  08/28/2007 10:48:42  T:  08/29/2007 00:55:38  Job #:  [id number]

## 2010-06-29 NOTE — Procedures (Signed)
[name], GOEBEL                ACCOUNT NO.:  [id number]   MEDICAL RECORD NO.:  [id number]          PATIENT TYPE:  REC   LOCATION:  [facility]                          FACILITY:  [facility]   PHYSICIAN:  Erick Colace, M.D.DATE OF BIRTH:  10/24/1955   DATE OF PROCEDURE:  04/30/2007  DATE OF DISCHARGE:                               OPERATIVE REPORT   PREOPERATIVE DIAGNOSIS:  Right T12 T12, L1 and L2 medial branch block  under fluoroscopic guidance.   INDICATIONS:  Lumbar facet-mediated pain with prior relief obtained for  10 months' duration with lumbar medial branch blocks at these same  levels.  Pain is only partially responsive to medication management and  other conservative care and interferes with walking and bending.   Informed consent was obtained after describing risks and benefits of the  procedure to the patient.  These include bleeding, bruising, infection  as well as temporary or permanent paralysis.  She elects to proceed and  has given written consent.  The patient placed prone on fluoroscopy  table.  Betadine prep, sterile drape.  A 25-gauge inch and half needle  was used to anesthetize skin and subcu tissue, 1% lidocaine x2 mL.  Then  a 22-gauge 3-1/2-inch spinal needle was inserted under fluoroscopic  guidance.  Starting at the left L1 SAP-transverse process junction, bone  contact made, confirmed with lateral imaging.  Omnipaque 180 x0.5 mL  demonstrated no intravascular uptake.  Then 0.5 mL of a solution  containing 1 mL of 4 mg/mL dexamethasone and 2 mL of 2% MPF lidocaine  was injected.  Then the left L2 SAP-transverse process junction  targeted, bone contact made, confirmed with lateral imaging.  Omnipaque  0.80 x0.5 mL demonstrated no intravascular uptake.  Then 0.5 mL of the  dexamethasone-lidocaine solution was injected.  Then the left L3 SAP-  transverse process junction targeted, bone contact made, confirmed with  lateral imaging.  Omnipaque 180 x0.5 mL  demonstrated no intravascular  uptake.  Then 0.5 mL the dexamethasone-lidocaine solution was injected.  The patient tolerated the procedure well.  Pre and post injection vitals  stable.  Post injection instructions given.  Pre injection pain level  was 6/10, post injection 3/10.  Will return in 1 month for follow-up  visit.      Erick Colace, M.D.  Electronically Signed     AEK/MEDQ  D:  04/30/2007 11:12:17  T:  04/30/2007 12:11:10  Job:  [id number]

## 2010-07-02 NOTE — Procedures (Signed)
[name], [name]                ACCOUNT NO.:  [id number]   MEDICAL RECORD NO.:  [id number]          PATIENT TYPE:  REC   LOCATION:  [facility]                          FACILITY:  [facility]   PHYSICIAN:  Erick Colace, M.D.DATE OF BIRTH:  14-Mar-1955   DATE OF PROCEDURE:  11/07/2005  DATE OF DISCHARGE:                                 OPERATIVE REPORT   NO DICTATION      Erick Colace, M.D.  Electronically Signed     AEK/MEDQ  D:  11/07/2005 15:23:18  T:  11/09/2005 15:01:08  Job:  [id number]

## 2010-07-02 NOTE — Procedures (Signed)
[name], MOULIN                ACCOUNT NO.:  [id number]   MEDICAL RECORD NO.:  [id number]          PATIENT TYPE:  REC   LOCATION:  [facility]                          FACILITY:  [facility]   PHYSICIAN:  Erick Colace, M.D.DATE OF BIRTH:  08-29-1955   DATE OF PROCEDURE:  06/19/2006  DATE OF DISCHARGE:                               OPERATIVE REPORT   PROCEDURE:  This is a right L1, L2, L3 medial branch block under  fluoroscopic guidance.   INDICATIONS:  Right facette mediated pain only partially responsive to  medication management.  She has had extended period of relief following  L1, L2, L3 right-sided medial branch blocks September 2007, now worn  off.  Informed consent was obtained after describing risks and benefits  of the procedure to the patient.  These include bleeding, bruising,  infection, loss of bowel or bladder function, temporary or permanent  paralysis.  She elects proceed and has given written consent.   The patient placed prone on fluoroscopy table.  Betadine prep, sterile  drape.  A 25 gauge inch and a half needle was used to anesthetize the  skin and subcu tissue, 1% lidocaine x2 mL and 22-gauge 3-1/2 inch spinal  needle was inserted first targeting the right L4 SAP transverse process  junction.  Bone contact made and confirmed with lateral imaging.  Omnipaque 180 x 0.5 mL demonstrated no intravascular uptake and 0.5 mL  then 1 mL of solution containing 1 mL of 40 mg/mL Depo-Medrol and 2 mL  of 2% lidocaine.  Then the right L3 SAP transverse process junction  targeted, bone contact made and confirmed with lateral imaging.  Omnipaque 180 x 0.5 mL demonstrated no intravascular uptake then the  Depo-Medrol lidocaine solution was injected and last the right L2 SAP  transverse process junction targeted and bone contact made, confirmed  with lateral imaging.  Omnipaque 180 x 0.5 mL demonstrated no  intravascular uptake, then 1 mL of the Depo-Medrol lidocaine solution  was  injected.  The patient tolerated the procedure well.  Pre and post  injection vitals.  Pre-injection pain level 3/10, although with activity  7/10.  Post injection pain level with ambulation is 0.  Return in 1  month follow-up.  If she only has a month or so relief with this  injection, would proceed on to radiofrequency neurotomy.      Erick Colace, M.D.  Electronically Signed     AEK/MEDQ  D:  06/19/2006 13:49:06  T:  06/19/2006 16:13:16  Job:  [id number]
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
## 2010-07-02 NOTE — Procedures (Signed)
[name], [name]                ACCOUNT NO.:  [id number]   MEDICAL RECORD NO.:  [id number]          PATIENT TYPE:  REC   LOCATION:  [facility]                          FACILITY:  [facility]   PHYSICIAN:  Erick Colace, M.D.DATE OF BIRTH:  01/27/1956   DATE OF PROCEDURE:  04/25/2006  DATE OF DISCHARGE:                               OPERATIVE REPORT   PROCEDURE:  Right carpal tunnel injection.   INDICATIONS:  Right carpal tunnel syndrome demonstrated by  Electrodiagnostic studies, unrelieved by splinting, and  medications.   INFORMED CONSENT:  Obtained after describing risks and benefits of the  procedure to the patient.  These include bleeding, bruising, infection,  she elects to proceed.   DESCRIPTION OF PROCEDURE:  Distal wrist crease right hand, right wrist  done marked prepped with Betadine, entered with 27-gauge 5/8-inch  needle.  Skin wheal raised with 1% lidocaine.  Then needle switched out  to another 27-gauge needle and 0.25 mL of a solution containing 40 mg/mL  Depo-Medrol was injected after negative drawback for blood.   The patient tolerated the procedure well.  No paresthesias.  Post  injection instructions given.  Return in 2 months.      Erick Colace, M.D.  Electronically Signed     AEK/MEDQ  D:  04/25/2006 08:54:42  T:  04/25/2006 09:17:32  Job:  [id number]

## 2010-07-02 NOTE — Assessment & Plan Note (Signed)
Patient with right-sided lumbar facet arthropathy who has had medial  branch blocks performed on the right side at L1,2,3 on May 11, 2005,  Jun 16, 2005, and November 09, 2005. She has had continuing relief post  injection up until this point. Interval history positive for cataract  removal February 22, 2006. She has had no new medical problems other than  the above.   Her pain score is 0. Functional status: Ambulates 30 minutes, climbs  steps, she drives, she works 40 hours a week.   REVIEW OF SYSTEMS:  Positive for depression and anxiety. No suicidal  thoughts.   PAIN MEDICATION:  None, other than Cymbalta for depression and anxiety.   Her blood pressure is 135/77, pulse 83, respirations 16, O2 sat 99% in  room air.  GENERAL: No acute distress, mood and affect appropriate.  Her back has full range motion.  She has normal strength in her lower extremities, normal range of motion  in lower extremities.  Body habitus is obese.  Affect is bright and alert.  Gait is normal.   IMPRESSION:  Lumbar facet syndrome, L2-3, L3-4 facets.   PLAN:  Hold off on further medial branch blocks, see her back in 6  weeks. I anticipate it should be starting to wear off at that point  based on previous history. If she stays worse off before that time we  may consider doing an injection at next visit, rather than follow up.   We also counseled on exercise program resumption.      Erick Colace, M.D.  Electronically Signed     AEK/MedQ  D:  03/06/2006 09:33:51  T:  03/06/2006 10:15:03  Job #:  [id number]

## 2010-07-02 NOTE — Assessment & Plan Note (Signed)
DATE OF VISIT:  07/17/2006   DATE OF LAST VISIT:  06/19/2006   At which time I did a right L1/L2/L3 medial branch block under  fluoroscopic guidance.  Her pain has improved.  She had 7 out of 10  activity related pain and now down to zero.  She has had previous  history of prolonged relief with medial branch blocks up to 6 months.  Also in regards to right hand numbness and tingling carpal tunnel  syndrome was demonstrated by electrodiagnosis studies and she has had  almost 3 months relief of her right carpal tunnel syndrome status post  injection 04/25/2006.  Her new complaint is left toe pain.  Denies any  numbness or tingling.  No associated back pain.  Pain is mainly when she  is walking.  She does not recall any type of trauma history.  She does  not recall any other associated symptoms such as skin rash or joint  swelling.   Her pain increases with walking.   She has tried over-the-counter agents without much help.   PHYSICAL EXAMINATION:  GENERAL:  No acute distress. Mood and affect  appropriate.  FEET:  Have no signs of dysvascular changes.  SKIN:  Warm and dry. Good coloration.  EXTREMITIES:  She has normal pulses both tibial and pedal.  She has good  range of motion of ankle and toes except hyperextension of the toes  causes increased pain.  She has increased callus between the second and  third metatarsal on the left side only.  She has pain in the dorsum  between the second and third metatarsal as well.   IMPRESSION:  1. Left metatarsalgia probably Morton's neuroma.  2. Lumbar facet arthropathy on the right improved.  3. Right carpal tunnel syndrome improved.   PLAN:  1. We will check foot x-rays.  2. See her back for possible injection of Morton's neuroma.  3. Also recommended Dr. Margart Sickles type pad over the plantar surface of      the foot.      Erick Colace, M.D.  Electronically Signed     AEK/MedQ  D:  07/17/2006 11:05:28  T:  07/17/2006  11:37:38  Job #:  [id number]

## 2010-07-02 NOTE — Procedures (Signed)
NAMEMONET, NORTH                ACCOUNT NO.:  [id number]   MEDICAL RECORD NO.:  [id number]          PATIENT TYPE:  REC   LOCATION:  [facility]                          FACILITY:  [facility]   PHYSICIAN:  Erick Colace, M.D.DATE OF BIRTH:  12/08/1955   DATE OF PROCEDURE:  DATE OF DISCHARGE:                                 OPERATIVE REPORT   PROCEDURE:  This is a right L1, L2, L3, medial branch block under  fluoroscopic guidance.   INDICATIONS:  Lumbar facet arthropathy with previous long-term pain relief,  status post injection greater than six months ago.  Informed consent was  obtained after describing the risks and benefits of the procedure to the  patient.  These include bleeding, bruising, infection, loss of bowel and  bladder function, temporary or permanent paralysis.  She elected to proceed  and was given written consent.   DESCRIPTION OF PROCEDURE:  The patient was placed on the fluoroscopy table.  Betadine prep and sterile drape.  A 25-gauge, 1-1/2 inch needle was inserted  through the skin and subcutaneous tissue, 1% lidocaine, 2 mL, at each site  x3.  Then a 22-gauge, 3-1/2 inch spinal needle was inserted under  fluoroscopic guidance, first targeting the right L4 SAP transverse process  junction, the bone contact made and confirmed with lateral imaging,  Omnipaque 180 x 0.5 mL demonstrated no intravascular uptake, then 0.5 mL of  Depo-Medrol and lidocaine solution was injected.  Then the right L3 SAP  transverse process junction targeted.  Bone contact made and confirmed with  lateral imaging.  Omnipaque 180 x 0.5 mL demonstrated no intravascular  uptake, then 0.5 mL of Depo-Medrol and lidocaine solution was injected.  Then the right L2 SAP transverse process junction targeted.  Bone contact  made and confirmed with lateral imaging.  Omnipaque 180 x 0.5 mL  demonstrated no intravascular uptake, then 0.5 mL of Depo-Medrol and  lidocaine solution was injected.  Solution  consisted of 0.5 mL of 40 mg per  mL of Depo-Medrol and 1.5 mL of 2% lidocaine.  The patient tolerated the  procedure well.  Post injection instructions were given.      Erick Colace, M.D.  Electronically Signed     AEK/MEDQ  D:  11/07/2005 11:54:11  T:  11/09/2005 01:59:57  Job:  [id number]

## 2010-07-02 NOTE — Assessment & Plan Note (Signed)
HISTORY:  Right lumbar facet syndrome L2/3, L3/4 demonstrated by L1/2/3  medial branch blocks last performed September 26th.  She has had a flare  up in her back pain a couple of weeks ago.  No apparent reason.  She has  had some problems with carpal tunnel symptoms in the right hand as well.  She has more frequent night pain.  She is wearing wrist splints.  She  has had no elbow or shoulder pain or significant neck pain.  She  continues to work 40 hours a week.  She can walk 30 minutes at a time,  continues to exercise.   REVIEW OF SYSTEMS:  Positive for depression as well as numbness in the  hand and bladder control problems but these are not new.  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   PHYSICAL EXAMINATION:  VITAL SIGNS:  Blood pressure 134/75, pulse 86,  respirations 18, 02 sat 99% on room air.  GENERAL:  No acute distress. Mood and affect appropriate.  Alert and  oriented X3. Gait is normal.   She has reverse Phalen's showing mainly wrist pain but then followed by  numbness in the four radial fingers.  She has no intrinsic atrophy in  the hands.  She has good grip strength.  Back:  Tenderness to palpation  in the lumbar paraspinal mainly just on the right L5/S1 area.  She has  good spine range of motion in terms of flexion and extension and she has  normal strength in the lower extremities as well as range of motion.   IMPRESSION:  1. Lumbar facet syndrome, right L2/3, L3/4.  She will likely need some      lumbar medial branch blocks repeated in the next month or 2.  I      will see her back in 2 months but she will call if she needs to      ahead of time.  2. Carpal tunnel syndrome right, demonstrated by EDX given persistent      or progressive symptoms despite splints. Will inject carpal tunnel      in the next 1-2 weeks.  She agrees with this plan and will      schedule.  3. She can continue p.r.n. Naprosyn.      Erick Colace, M.D.  Electronically  Signed     AEK/MedQ  D:  04/17/2006 09:55:28  T:  04/17/2006 10:41:48  Job #:  [id number]

## 2010-07-02 NOTE — Assessment & Plan Note (Signed)
MEDICAL RECORD NUMBER:  [id number]   DATE OF BIRTH:  December 22, 1955   HISTORY OF PRESENT ILLNESS:  The patient was last seen by me July 28, 2005,  no hydrocodone.  She was taking Aleve, pain level about 3/10.  She feels  like it is creeping up somewhat.  She has had last L1, 2 and 3 medial branch  blocks under fluoroscopic guidance on Jul 13, 2005, approximately 2-1/2  months ago.   She has had no new problems, but in fact is now exercising more at a gym,  doing Palates as well as some Thera-Band exercises and some aerobic  exercises.  She climbs steps, she drives, she works 40 hours a week.   REVIEW OF SYSTEMS:  As noted on health and history form.   PHYSICAL EXAMINATION:  VITAL SIGNS:  Blood pressure 128/62, pulse 77,  respirations 18 and O2 saturation 99% on room air.  GENERAL:  An obese female in no acute distress.  Mood and affect  appropriate.  BACK:  No tenderness to palpation.  She has some pain with twisting as well  as hyperextension.  Forward flexion is not painful.  EXTREMITIES:  She has normal gait and no lower extremity weakness.  Normal  range of motion in range of motion in lower extremities.   IMPRESSION:  Lumbar facet syndrome, right L2-3, L3-4, chronic, and will  require medial branch blocks scheduled for 1 month.      Erick Colace, M.D.  Electronically Signed     AEK/MedQ  D:  10/04/2005 13:48:00  T:  10/05/2005 06:20:07  Job #:  [id number]   cc:   Molly Maduro A. Nicholos Johns, M.D.  Fax: [phone]

## 2010-07-02 NOTE — Op Note (Signed)
NAME:  [name], [name]                          ACCOUNT NO.:  [id number]   MEDICAL RECORD NO.:  [id number]                   PATIENT TYPE:  AMB   LOCATION:  [facility]                                  FACILITY:  [facility]   PHYSICIAN:  Pershing Cox, M.D.            DATE OF BIRTH:  19-May-1955   DATE OF PROCEDURE:  12/13/2002  DATE OF DISCHARGE:                                 OPERATIVE REPORT   PREOPERATIVE DIAGNOSIS:  Menorrhagia.   POSTOPERATIVE DIAGNOSIS:  Menorrhagia.   PROCEDURE:  Exam under anesthesia, fractional D&C, hysteroscopy,  cryoablation using HER-2 option.   SURGEON:  Pershing Cox, M.D.   ANESTHESIA:  General by LMA and paracervical block using 0.25% Marcaine.   INDICATIONS FOR PROCEDURE:  The patient is a 55 year old female who began to  have really heavy menstrual bleeding in June bleeding daily. She had heavy  flow with q. 1 hour. She was seen in my office initially and at that time  her pelvic ultrasound was reviewed. This showed lobulated uterus with  numerous fibroids and the endometrial stripe could not be adequately  accessed. Transvaginal sonogram was performed in my office using  hydrosonogram technique. The uterus was about 9 cm in size with two  subserosal myomas which were each about 4 cm in size. The endometrium was  difficult to visualize because of shadowing from the myomas but it appeared  that the lining was about 0.9 mm and that there were no filling defects.  After counseling for these findings, the patient elected to be brought to  the operating room for sampling of her endometrium and also attempt at  cryoablation. Pap smear was reviewed. This had been collected by Dr.  Gaynell Face previously and was within normal limits. The patient was brought to  the operating room today for this procedure.   FINDINGS:  Examination under anesthesia was largely unrevealing because of  the patient's obesity. There is a 10-12 week size uterus which is slightly  irregular and no palpable adnexal masses. The patient's endometrial cavity  sounds to 11 cm. There was very little space in the fundus. The opening was  really small barely admitting a small curette. The ostia of the fallopian  tubes could be not be visualized.   DESCRIPTION OF PROCEDURE:  [name] was brought to the operating room  with an IV in place. She had received a gram of Ancef in the holding area  and been counselled regarding the procedure and its risks which she  accepted. Supine on the OR table, she was initially placed into Allen  stirrups to guarantee that she would be comfortable prior to the induction  of anesthesia. Once she was adequately strapped in place, IV sedation was  administered and then an LMA mask was placed without difficulty. The lower  abdomen, perineum and vagina were prepped with a solution of Hibiclens. A  red rubber catheter was used  to empty the bladder. The patient was draped  for a sterile vaginal procedure.   The speculum was repositioned several times during the procedure. The  patient's obesity made it very difficult to see. The cervix is very high in  the vagina and could be visualized only with continued pressure of the  speculum. The cervix was grasped with a single tooth tenaculum. Endocervical  curettings were collected on the Telfa and the uterine sound had passed to a  depth of 11 cm. Paracervical block was administered by injecting 10 mL of  0.25% Marcaine into the stroma of the cervix at the 3, 4, 7 and 8 positions.  Next serial Pratt dilators were used to dilate the cervix to size 25. The  hysteroscope was introduced and using through and through sorbitol  irrigation on initial pressure of 60, the cavity was visualized and a  photograph was taken. Tissue in the upper fundus prevented visualization of  the fundus. For this reason, the small sharp curette was used to curette the  endometrial cavity and I viewed it again. There was still  tissue in the  fundus and it was my impression that the curette could not reach to the  depth of the fundus, therefore, the Meigs curette was used and more tissue  was extracted. With this extraction, we could see that there was very little  tissue left in the endometrial canal. I still could not visualize the ostia.  The pressure was increased to 100 and an attempt was made to take a picture  of the upper fundus although it was not very successful. With this tissue  removed, the HER-2 option probe was precooled and then inserted into the  cavity. It was positioned as close to the left tubal ostia as possible and a  six minute freeze ensued. There was a two minute thaw and then a refreeze in  the area of the right ostia. After this freeze and thaw, the probe was  brought back 2 cm and a third 6 minute freeze followed by a thaw was  conducted. The tenaculum was removed after the probe had been removed, there  was no evidence of bleeding and the patient was taken to the recovery room  in good condition.                                               Pershing Cox, M.D.    MAJ/MEDQ  D:  12/13/2002  T:  12/13/2002  Job:  [id number]   cc:   Currie Paris, M.D.  [address]  [place]  [place] [address]  Fax: [phone]   Leighton Roach. Truett Perna, M.D.  [address] [facility]  [place]  [place]  [address]  Fax: [phone]
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
## 2010-07-02 NOTE — Assessment & Plan Note (Signed)
Date of last visit:  May 12, 2005, at which time I performed right L1, L2,  L3 medial branch blocks under fluoroscopic guidance.   The patient was to repeat medial branch blocks today; however, she has had  continued beneficial effect of the first injection to the point where she  states her pain is essentially zero.  She is now able to bend and flex  toward the right side, which is something she could not do prior to.   She has had no other medical problems.  She has, in fact, increased her  activity level and had a garage sale at her place.   Current medications include:  1.  Naproxen 550 mg b.i.d.  2.  Cymbalta 60 mg p.o. daily.  3.  Hydrocodone one tablet one to two times per day.  4.  Micardis, Welchol and Lescol.   PHYSICAL EXAMINATION:  Examination reveals no pain on forward flexion.  She  has mild pain and limitation of range of motion that is 25% of normal range  with extension.  She is able to lean to the right as well as to the left.  She has normal strength and range of motion in her lower extremities, normal  deep tendon reflexes.   IMPRESSION:  Lumbar facet syndrome with a prolonged improvement following  bilateral medial branch block on the right denervating L2-3 and L3-4 levels.   PLAN:  Will see her back in six weeks.  If the pain is starting to recur at  that time, will schedule her for repeat medial branch blocks.  If she, in  fact, has recurrence of pain prior to that time, she can call and schedule  for repeat medial branch blocks.  Otherwise, we discussed hopefully that  this will last for a prolonged period of time and that we will not need to  proceed on to lumbar RF but just repeat medial branches as needed up to four  times a year as necessary.      Erick Colace, M.D.  Electronically Signed     AEK/MedQ  D:  06/16/2005 16:43:51  T:  06/17/2005 12:23:48  Job #:  [id number]   cc:   Molly Maduro A. Nicholos Johns, M.D.  Fax: [phone]

## 2010-07-02 NOTE — Procedures (Signed)
NAMEMCKALA, [name]                ACCOUNT NO.:  [id number]   MEDICAL RECORD NO.:  [id number]          PATIENT TYPE:  REC   LOCATION:  [facility]                          FACILITY:  [facility]   PHYSICIAN:  Erick Colace, M.D.DATE OF BIRTH:  1955-05-07   DATE OF PROCEDURE:  05/12/2005  DATE OF DISCHARGE:                                 OPERATIVE REPORT   PROCEDURE:  Right L1, L2 and L3 medial branch blocks.   INDICATIONS:  Upper lumbar pain, right side only, only partially responsive  to oral medications as well as therapy.   After signing informed consent, patient premedicated with Sonata 10 mg p.o.  She has a driver.  Explained bleeding, bruising, infection, loss of bowel  and bladder function.  Patient placed prone on fluoroscopy table.  A 25-  gauge 1-1/2 inch needle was used to anesthetize skin and subcu tissues, then  1% lidocaine x2 mL at each of three sites.  Then a 22-gauge 3-1/2 inch  spinal needle was inserted, first targeting the right L4 SAP-transverse  process junction, bone contact made, confirmed with lateral imaging.  Then a  solution containing 0.5 mL of 40 mg/mL of Depo-Medrol and 2 mL of 2%  methylparaben-free lidocaine was injected with 0.5 mL at this site.  Next  the right L3 SAP-transverse process junction targeted, bone contact made,  confirmed with lateral imaging.  Omnipaque 180 x0.5 mL demonstrated no  intravascular uptake.  Then 0.5 mL of the Depo-Medrol-lidocaine solution  were injected.  Then last the left L2 SAP-transverse junction was targeted,  bone contact made, confirmed with lateral imaging.  Omnipaque 180 x0.5 mL  demonstrated no intravascular uptake.  Then 0.5 mL of the Depo-Medrol-  lidocaine solution were injected.  The patient tolerated the procedure well.  Postinjection instructions given.      Erick Colace, M.D.  Electronically Signed     AEK/MEDQ  D:  05/12/2005 14:29:37  T:  05/14/2005 06:35:07  Job:  [id number]

## 2012-04-08 ENCOUNTER — Encounter (HOSPITAL_COMMUNITY): Payer: Self-pay | Admitting: Emergency Medicine

## 2012-04-08 ENCOUNTER — Emergency Department (HOSPITAL_COMMUNITY)
Admission: EM | Admit: 2012-04-08 | Discharge: 2012-04-08 | Disposition: A | Discharge status: Home / Self Care | Payer: BC Managed Care – PPO | Attending: Emergency Medicine | Admitting: Emergency Medicine

## 2012-04-08 ENCOUNTER — Emergency Department (HOSPITAL_COMMUNITY): Payer: BC Managed Care – PPO

## 2012-04-08 DIAGNOSIS — E785 Hyperlipidemia, unspecified: Secondary | ICD-10-CM | POA: Insufficient documentation

## 2012-04-08 DIAGNOSIS — J159 Unspecified bacterial pneumonia: Secondary | ICD-10-CM | POA: Insufficient documentation

## 2012-04-08 DIAGNOSIS — J189 Pneumonia, unspecified organism: Secondary | ICD-10-CM

## 2012-04-08 DIAGNOSIS — Z8709 Personal history of other diseases of the respiratory system: Secondary | ICD-10-CM | POA: Insufficient documentation

## 2012-04-08 DIAGNOSIS — Z853 Personal history of malignant neoplasm of breast: Secondary | ICD-10-CM | POA: Insufficient documentation

## 2012-04-08 DIAGNOSIS — R51 Headache: Secondary | ICD-10-CM | POA: Insufficient documentation

## 2012-04-08 DIAGNOSIS — I10 Essential (primary) hypertension: Secondary | ICD-10-CM | POA: Insufficient documentation

## 2012-04-08 DIAGNOSIS — Z87891 Personal history of nicotine dependence: Secondary | ICD-10-CM | POA: Insufficient documentation

## 2012-04-08 DIAGNOSIS — Z79899 Other long term (current) drug therapy: Secondary | ICD-10-CM | POA: Insufficient documentation

## 2012-04-08 DIAGNOSIS — R110 Nausea: Secondary | ICD-10-CM | POA: Insufficient documentation

## 2012-04-08 DIAGNOSIS — J029 Acute pharyngitis, unspecified: Secondary | ICD-10-CM | POA: Insufficient documentation

## 2012-04-08 LAB — COMPREHENSIVE METABOLIC PANEL
ALT: 31 U/L (ref 0–35)
Albumin: 3.6 g/dL (ref 3.5–5.2)
Alkaline Phosphatase: 59 U/L (ref 39–117)
BUN: 8 mg/dL (ref 6–23)
Chloride: 98 mEq/L (ref 96–112)
GFR calc Af Amer: >90 mL/min (ref >90)
GFR calc non Af Amer: >90 mL/min (ref >90)
Sodium: 132 mEq/L — ABNORMAL LOW (ref 135–145)
Total Bilirubin: 0.4 mg/dL (ref 0.3–1.2)

## 2012-04-08 LAB — URINALYSIS, ROUTINE W REFLEX MICROSCOPIC
Bilirubin Urine: NEGATIVE
Glucose, UA: NEGATIVE mg/dL
Hgb urine dipstick: NEGATIVE
Ketones, ur: NEGATIVE mg/dL
Leukocytes, UA: NEGATIVE
Nitrite: NEGATIVE
Protein, ur: NEGATIVE mg/dL
Specific Gravity, Urine: 1.018 (ref 1.005–1.030)
Urobilinogen, UA: 0.2 mg/dL (ref 0.0–1.0)
pH: 7.5 (ref 5.0–8.0)

## 2012-04-08 LAB — CBC WITH DIFFERENTIAL/PLATELET
Basophils Absolute: 0 10*3/uL (ref 0.0–0.1)
Basophils Relative: 0 % (ref 0–1)
Eosinophils Relative: 1 % (ref 0–5)
HCT: 41 % (ref 36.0–46.0)
Hemoglobin: 13.7 g/dL (ref 12.0–15.0)
Lymphocytes Relative: 7 % — ABNORMAL LOW (ref 12–46)
MCHC: 33.4 g/dL (ref 30.0–36.0)
MCV: 89.7 fL (ref 78.0–100.0)
Monocytes Absolute: 0.6 10*3/uL (ref 0.1–1.0)
Neutrophils Relative %: 86 % — ABNORMAL HIGH (ref 43–77)
Platelets: 265 10*3/uL (ref 150–400)
RBC: 4.57 MIL/uL (ref 3.87–5.11)
RDW: 14.7 % (ref 11.5–15.5)

## 2012-04-08 LAB — CG4 I-STAT (LACTIC ACID): Lactic Acid, Venous: 1.35 mmol/L (ref 0.5–2.2)

## 2012-04-08 LAB — PROCALCITONIN: Procalcitonin: <0.1 ng/mL

## 2012-04-08 LAB — EXPECTORATED SPUTUM ASSESSMENT W GRAM STAIN, RFLX TO RESP C

## 2012-04-08 MED ORDER — HYDROMORPHONE HCL PF 1 MG/ML IJ SOLN
1.0000 mg | Freq: Once | INTRAMUSCULAR | Status: AC
  Administered 2012-04-08: 1 mg via INTRAVENOUS
  Filled 2012-04-08: qty 1

## 2012-04-08 MED ORDER — IPRATROPIUM BROMIDE 0.02 % IN SOLN
0.5000 mg | Freq: Once | RESPIRATORY_TRACT | Status: AC
  Administered 2012-04-08: 0.5 mg via RESPIRATORY_TRACT
  Filled 2012-04-08: qty 2.5

## 2012-04-08 MED ORDER — SODIUM CHLORIDE 0.9 % IV SOLN
1000.0000 mL | Freq: Once | INTRAVENOUS | Status: AC
  Administered 2012-04-08: 1000 mL via INTRAVENOUS

## 2012-04-08 MED ORDER — AZITHROMYCIN 250 MG PO TABS
500.0000 mg | ORAL_TABLET | Freq: Once | ORAL | Status: AC
  Administered 2012-04-08: 500 mg via ORAL
  Filled 2012-04-08: qty 2

## 2012-04-08 MED ORDER — ONDANSETRON HCL 4 MG PO TABS: 4.0000 mg | ORAL_TABLET | Freq: Four times a day (QID) | ORAL | Status: DC

## 2012-04-08 MED ORDER — ALBUTEROL SULFATE (5 MG/ML) 0.5% IN NEBU
5.0000 mg | INHALATION_SOLUTION | Freq: Once | RESPIRATORY_TRACT | Status: AC
  Administered 2012-04-08: 5 mg via RESPIRATORY_TRACT
  Filled 2012-04-08: qty 1

## 2012-04-08 MED ORDER — DEXTROSE 5 % IV SOLN
1.0000 g | Freq: Once | INTRAVENOUS | Status: AC
  Administered 2012-04-08: 1 g via INTRAVENOUS
  Filled 2012-04-08: qty 10

## 2012-04-08 MED ORDER — SODIUM CHLORIDE 0.9 % IV SOLN
1000.0000 mL | Freq: Once | INTRAVENOUS | Status: AC
Start: 2012-04-08 — End: 2012-04-08
  Administered 2012-04-08: 1000 mL via INTRAVENOUS

## 2012-04-08 MED ORDER — SODIUM CHLORIDE 0.9 % IV SOLN
1000.0000 mL | INTRAVENOUS | Status: DC
  Administered 2012-04-08: 1000 mL via INTRAVENOUS

## 2012-04-08 MED ORDER — ALBUTEROL SULFATE HFA 108 (90 BASE) MCG/ACT IN AERS
2.0000 | INHALATION_SPRAY | Freq: Once | RESPIRATORY_TRACT | Status: AC
  Administered 2012-04-08: 2 via RESPIRATORY_TRACT
  Filled 2012-04-08: qty 6.7

## 2012-04-08 MED ORDER — ACETAMINOPHEN 325 MG PO TABS
650.0000 mg | ORAL_TABLET | Freq: Once | ORAL | Status: AC
  Administered 2012-04-08: 650 mg via ORAL
  Filled 2012-04-08: qty 2

## 2012-04-08 MED ORDER — ONDANSETRON HCL 4 MG/2ML IJ SOLN
4.0000 mg | INTRAMUSCULAR | Status: AC
  Administered 2012-04-08: 4 mg via INTRAVENOUS
  Filled 2012-04-08: qty 2

## 2012-04-08 MED ORDER — LEVOFLOXACIN 500 MG PO TABS: 500.0000 mg | ORAL_TABLET | Freq: Every day | ORAL | Status: DC

## 2012-04-08 NOTE — ED Notes (Signed)
Pt family driving pt home

## 2012-04-08 NOTE — ED Provider Notes (Signed)
BRIA SPARR is a 57 y.o. female. female who is here for evaluation of cough, shortness of breath, weakness, dizziness, and headache. Her symptoms are recurrent, after her recent treatment for sinus infection. She was treated with an unknown antibiotic by her primary care Dr. She has a history of bronchitis. She has posttussive emesis. She smokes cigarettes. She does not have chronic medical problems..  Exam alert, cooperative, appears uncomfortable. PERRL. Extraocular muscles are intact. Neck is supple. Heart tachycardic. Lungs scattered rhonchi and wheezes. Neurologic, grossly nonfocal.  Reevaluation: 10: 04- she states that she feels worse, now with worsening headache. Blood pressure is improved to 139/72. IV analgesia, is ordered.  Treatment for CAP done.   Medical screening examination/treatment/procedure(s) were conducted as a shared visit with non-physician practitioner(s) and myself.  I personally evaluated the patient during the encounter  Flint Melter, MD 04/08/12 [phone]

## 2012-04-08 NOTE — ED Notes (Addendum)
Pt pulse ox 97% consistently while ambulating, HR 86bpm.  Pt short of breath upon arrival back to room with increased cough.

## 2012-04-08 NOTE — ED Notes (Signed)
Pt c/o fever, headache, productive cough for 2 days. Pt has been taking OTC medications w/ no relieve

## 2012-04-08 NOTE — ED Provider Notes (Signed)
History     CSN: [id number]  Arrival date & time 04/08/12  3664   First MD Initiated Contact with Patient 04/08/12 [id number]      Chief Complaint  Patient presents with  . Fever    (Consider location/radiation/quality/duration/timing/severity/associated sxs/prior treatment) HPI Comments: Patient is 57 y/o female with PMH of HTN and HLD who presents with fever, productive cough and headache since last night. Patient states last night she started to feel very ill and weak. She took her temperature which was 101.5 and took some tylenol with temporary fever relief. Temp spiked to 101 again this morning after waking which, along with other symptoms, prompted the patient to come to the ED. Patient has associated chest congestion and headache. Denies nasal congestion, rhinorrea, ear pain or discharge, vision changes or syncope. Patient dx with sinusitis 1 month ago which resolved after receiving abx from her PCP. Patient admits to hx of bronchitis; denies asthma history. Denies sick contacts.  Patient is a 57 y.o. female presenting with fever. The history is provided by the patient. No language interpreter was used.  Fever Max temp prior to arrival:  101.5 Temp source:  Oral Severity:  Moderate Onset quality:  Sudden Duration:  2 days Timing:  Intermittent Progression:  Unchanged Chronicity:  New Relieved by:  Acetaminophen Associated symptoms: chills, cough, headaches, nausea and sore throat   Associated symptoms: no chest pain, no confusion, no diarrhea, no dysuria, no ear pain, no rhinorrhea and no vomiting   Associated symptoms comment:  Chest congestion   Past Medical History  Diagnosis Date  . Hypertension   . Hyperlipidemia   . History of breast cancer     Past Surgical History  Procedure Laterality Date  . Cataract extraction w/ intraocular lens  implant, bilateral    . Lumpectomy left breast    . Dilation and curettage of uterus      History reviewed. No pertinent family  history.  History  Substance Use Topics  . Smoking status: Former Smoker    Quit date: 06/18/2009  . Smokeless tobacco: Not on file  . Alcohol Use: 0.0 oz/week     Comment: occasional use    OB History   Grav Para Term Preterm Abortions TAB SAB Ect Mult Living                  Review of Systems  Constitutional: Positive for fever and chills.  HENT: Positive for sore throat. Negative for ear pain, rhinorrhea, neck pain, neck stiffness, tinnitus and ear discharge.   Eyes: Negative for photophobia and visual disturbance.  Respiratory: Positive for cough. Negative for shortness of breath.        Chest congestion  Cardiovascular: Negative for chest pain.  Gastrointestinal: Positive for nausea. Negative for vomiting, abdominal pain and diarrhea.  Genitourinary: Negative for dysuria and hematuria.  Skin: Negative for color change.  Neurological: Positive for weakness and headaches. Negative for syncope and numbness.  Psychiatric/Behavioral: Negative for confusion.  All other systems reviewed and are negative.    Allergies  Atorvastatin; Lipitor; Pravachol; Pravastatin; Pravastatin sodium; Sulfa antibiotics; and Sulfonamide derivatives  Home Medications   Current Outpatient Rx  Name  Route  Sig  Dispense  Refill  . fluvastatin XL (LESCOL XL) 80 MG 24 hr tablet   Oral   Take 80 mg by mouth at bedtime.          . Pyridoxine HCl (VITAMIN B-6) 250 MG tablet   Oral   Take 250 mg  by mouth daily.           Marland Kitchen telmisartan (MICARDIS) 80 MG tablet   Oral   Take 80 mg by mouth daily.             BP 115/62  Pulse 96  Temp(Src) 99.3 F (37.4 C) (Oral)  Resp 12  SpO2 96%  Physical Exam  Nursing note and vitals reviewed. Constitutional: She is oriented to person, place, and time. No distress.  Morbidly obese  HENT:  Head: Normocephalic and atraumatic.  Right Ear: External ear normal.  Left Ear: External ear normal.  Mouth/Throat: Oropharynx is clear and moist. No  oropharyngeal exudate.  Eyes: Conjunctivae and EOM are normal. Pupils are equal, round, and reactive to light. Right eye exhibits no discharge. Left eye exhibits no discharge. No scleral icterus.  Neck: Normal range of motion. Neck supple.  Cardiovascular: Regular rhythm, normal heart sounds and intact distal pulses.   tachycardic  Pulmonary/Chest: Effort normal. No respiratory distress. She has wheezes. She has no rales.  Abdominal: Soft. Bowel sounds are normal. There is no tenderness. There is no rebound.  Musculoskeletal: Normal range of motion. She exhibits no edema.  Lymphadenopathy:    She has cervical adenopathy.  Neurological: She is alert and oriented to person, place, and time.  Skin: Skin is warm. No rash noted. No erythema.  Psychiatric: She has a normal mood and affect. Her behavior is normal.    ED Course  Procedures (including critical care time)  Labs Reviewed  CBC WITH DIFFERENTIAL - Abnormal; Notable for the following:    Neutrophils Relative 86 (*)    Neutro Abs 9.0 (*)    Lymphocytes Relative 7 (*)    All other components within normal limits  COMPREHENSIVE METABOLIC PANEL - Abnormal; Notable for the following:    Sodium 132 (*)    Glucose, Bld 106 (*)    All other components within normal limits  CULTURE, EXPECTORATED SPUTUM-ASSESSMENT  CULTURE, BLOOD (ROUTINE X 2)  CULTURE, BLOOD (ROUTINE X 2)  URINE CULTURE  CULTURE, RESPIRATORY (NON-EXPECTORATED)  PROCALCITONIN  URINALYSIS, ROUTINE W REFLEX MICROSCOPIC  CG4 I-STAT (LACTIC ACID)   Dg Chest Port 1 View  04/08/2012  *RADIOLOGY REPORT*  Clinical Data: Fever and cough.  PORTABLE CHEST - 1 VIEW  Comparison: No prior chest x-rays currently available.  Findings: Asymmetry in the perihilar regions with bronchial thickening present on the left and potentially early perihilar pneumonia.  No edema or pleural fluid is identified.  Heart size is at the upper limits of normal.  IMPRESSION: Asymmetric bronchial  thickening in the left perihilar region with radiographic suspicion of potentially early perihilar pneumonia as well.   Original Report Authenticated By: Irish Lack, M.D.     Date: 04/08/2012  Rate: 97  Rhythm: normal sinus rhythm  QRS Axis: normal  Intervals: normal  ST/T Wave abnormalities: nonspecific ST changes  Conduction Disutrbances:nonspecific intraventricular conduction delay  Narrative Interpretation: Sinus rhythm with no evidence of STEMI; nonspecific ST changes and intraventricular conduction delay  Old EKG Reviewed: none available    1. Community acquired pneumonia      MDM  Patient with PMH of HTN and HLD presents with fever, productive cough and headache since last night. On exam patient tachycardic and BP 80/60 on monitor; temperature in triage 100.9. Sepsis order set completed as patient concerning for SIRS. CXR ordered to assess for pneumonia and Zofran ordered for nausea.  Patient's CXR findings consistent with perihilar pneumonia. Patient's blood cultures pending,  though CBC negative for leukocytosis. Lactic acid and procalcitonin normal. Patient started on IV rocephin and PO azithromycin. BP has stabilized with IVF to ~120/70. Tylenol ordered as patient has had another temp spike to 101.2. Will see how patient tolerates PO fluids; have elected to hold off on food as patient frequently experiencing strong coughing spells that cause her to bring up a yellow-colored, thick sputum. Will attempt to obtain sputum culture for processing.  Sputum culture sent; Have given some crackers to see if able to tolerate. If patient can continue to tolerate food and fluids PO will send home with antibiotics and instructions for follow up with her PCP.  16:54 - Patient ambulates without hypoxia, O2 sats remained at 97%, and BP after ambulation 115/60. Patient is tolerating food and fluids PO, is nontoxic and afebrile and will be discharged home on levaquin with instruction to follow up  with her PCP in 24-48 hours for reevaluation. Patient will also be given zofran for nausea and an albuterol inhaler for shortness of breath to take as needed. Bland, primarily liquid, diet recommended with adequate fluid hydration. Have discussed the patient work up, management and discharge plan with Dr. Effie Shy who is in agreement.  Filed Vitals:   04/08/12 1030 04/08/12 1253 04/08/12 1328 04/08/12 1504  BP: 121/73 111/57  115/62  Pulse: 96     Temp:  101.2 F (38.4 C)  99.3 F (37.4 C)  TempSrc:  Oral  Oral  Resp: 23 24  12   SpO2: 95% 95% 95% 96%         Antony Madura, PA-C 04/08/12 1721

## 2012-04-09 ENCOUNTER — Encounter (HOSPITAL_COMMUNITY): Payer: Self-pay | Admitting: *Deleted

## 2012-04-09 ENCOUNTER — Emergency Department (HOSPITAL_COMMUNITY)
Admission: EM | Admit: 2012-04-09 | Discharge: 2012-04-10 | Disposition: A | Discharge status: Home / Self Care | Payer: BC Managed Care – PPO | Attending: Emergency Medicine | Admitting: Emergency Medicine

## 2012-04-09 ENCOUNTER — Emergency Department (HOSPITAL_COMMUNITY): Payer: BC Managed Care – PPO

## 2012-04-09 DIAGNOSIS — R5381 Other malaise: Secondary | ICD-10-CM | POA: Insufficient documentation

## 2012-04-09 DIAGNOSIS — J029 Acute pharyngitis, unspecified: Secondary | ICD-10-CM | POA: Insufficient documentation

## 2012-04-09 DIAGNOSIS — Z79899 Other long term (current) drug therapy: Secondary | ICD-10-CM | POA: Insufficient documentation

## 2012-04-09 DIAGNOSIS — Z853 Personal history of malignant neoplasm of breast: Secondary | ICD-10-CM | POA: Insufficient documentation

## 2012-04-09 DIAGNOSIS — R599 Enlarged lymph nodes, unspecified: Secondary | ICD-10-CM | POA: Insufficient documentation

## 2012-04-09 DIAGNOSIS — R509 Fever, unspecified: Secondary | ICD-10-CM | POA: Insufficient documentation

## 2012-04-09 DIAGNOSIS — R51 Headache: Secondary | ICD-10-CM | POA: Insufficient documentation

## 2012-04-09 DIAGNOSIS — J189 Pneumonia, unspecified organism: Secondary | ICD-10-CM

## 2012-04-09 DIAGNOSIS — J159 Unspecified bacterial pneumonia: Secondary | ICD-10-CM | POA: Insufficient documentation

## 2012-04-09 DIAGNOSIS — J3489 Other specified disorders of nose and nasal sinuses: Secondary | ICD-10-CM | POA: Insufficient documentation

## 2012-04-09 DIAGNOSIS — R110 Nausea: Secondary | ICD-10-CM | POA: Insufficient documentation

## 2012-04-09 DIAGNOSIS — Z87891 Personal history of nicotine dependence: Secondary | ICD-10-CM | POA: Insufficient documentation

## 2012-04-09 DIAGNOSIS — I10 Essential (primary) hypertension: Secondary | ICD-10-CM | POA: Insufficient documentation

## 2012-04-09 DIAGNOSIS — E785 Hyperlipidemia, unspecified: Secondary | ICD-10-CM | POA: Insufficient documentation

## 2012-04-09 LAB — POCT I-STAT, CHEM 8
Calcium, Ion: 1.2 mmol/L (ref 1.12–1.23)
Glucose, Bld: 123 mg/dL — ABNORMAL HIGH (ref 70–99)
HCT: 38 % (ref 36.0–46.0)
Hemoglobin: 12.9 g/dL (ref 12.0–15.0)
Potassium: 3.7 mEq/L (ref 3.5–5.1)
TCO2: 26 mmol/L (ref 0–100)

## 2012-04-09 MED ORDER — HYDROMORPHONE HCL PF 1 MG/ML IJ SOLN
1.0000 mg | Freq: Once | INTRAMUSCULAR | Status: AC
  Administered 2012-04-09: 1 mg via INTRAMUSCULAR
  Filled 2012-04-09: qty 1

## 2012-04-09 MED ORDER — ACETAMINOPHEN 325 MG PO TABS
650.0000 mg | ORAL_TABLET | Freq: Once | ORAL | Status: AC
  Administered 2012-04-09: 650 mg via ORAL
  Filled 2012-04-09: qty 2

## 2012-04-09 MED ORDER — HYDROCODONE-HOMATROPINE 5-1.5 MG/5ML PO SYRP: 5.0000 mL | ORAL_SOLUTION | Freq: Four times a day (QID) | ORAL | Status: DC | PRN

## 2012-04-09 NOTE — ED Notes (Signed)
MD at bedside. [place]  

## 2012-04-09 NOTE — ED Provider Notes (Signed)
History     CSN: [id number]  Arrival date & time 04/09/12  2030   First MD Initiated Contact with Patient 04/09/12 2209      Chief Complaint  Patient presents with  . Cough  . Fever     HPI  The patient presents with concerns of ongoing cough, fever, congestion.  Symptoms began approximately one week ago, and she was seen here yesterday, diagnosed with pneumonia.  She notes that since that evaluation her symptoms are essentially unchanged, with persistent fever improved with Tylenol, persistent generalized discomfort, persistent cough, persistent mild dyspnea with coughing.  There is nausea, with posttussive emesis. There is no new confusion, no new disorientation, no new not coughing chest pain. The patient is a former smoker, has a history of hypertension, hypercholesterolemia  Past Medical History  Diagnosis Date  . Hypertension   . Hyperlipidemia   . History of breast cancer     Past Surgical History  Procedure Laterality Date  . Cataract extraction w/ intraocular lens  implant, bilateral    . Lumpectomy left breast    . Dilation and curettage of uterus      History reviewed. No pertinent family history.  History  Substance Use Topics  . Smoking status: Former Smoker    Quit date: 06/18/2009  . Smokeless tobacco: Not on file  . Alcohol Use: 0.0 oz/week     Comment: occasional use    OB History   Grav Para Term Preterm Abortions TAB SAB Ect Mult Living                  Review of Systems  Constitutional: Positive for fever and chills.  HENT: Positive for sore throat. Negative for ear pain, rhinorrhea, neck pain, neck stiffness, tinnitus and ear discharge.   Eyes: Negative for photophobia and visual disturbance.  Respiratory: Positive for cough. Negative for shortness of breath.        Chest congestion  Cardiovascular: Negative for chest pain.  Gastrointestinal: Positive for nausea. Negative for vomiting, abdominal pain and diarrhea.  Genitourinary:  Negative for dysuria and hematuria.  Skin: Negative for color change.  Neurological: Positive for weakness and headaches. Negative for syncope and numbness.  Psychiatric/Behavioral: Negative for confusion.  All other systems reviewed and are negative.    Allergies  Atorvastatin; Lipitor; Pravachol; Pravastatin; Pravastatin sodium; Sulfa antibiotics; and Sulfonamide derivatives  Home Medications   Current Outpatient Rx  Name  Route  Sig  Dispense  Refill  . fluvastatin XL (LESCOL XL) 80 MG 24 hr tablet   Oral   Take 80 mg by mouth at bedtime.          Marland Kitchen levofloxacin (LEVAQUIN) 500 MG tablet   Oral   Take 1 tablet (500 mg total) by mouth daily.   7 tablet   0   . ondansetron (ZOFRAN) 4 MG tablet   Oral   Take 1 tablet (4 mg total) by mouth every 6 (six) hours.   12 tablet   0   . Pyridoxine HCl (VITAMIN B-6) 250 MG tablet   Oral   Take 250 mg by mouth daily.           Marland Kitchen telmisartan (MICARDIS) 80 MG tablet   Oral   Take 80 mg by mouth daily.             BP 124/64  Pulse 90  Temp(Src) 100.5 F (38.1 C) (Oral)  Resp 16  SpO2 97%  Physical Exam  Nursing note and  vitals reviewed. Constitutional: She is oriented to person, place, and time. No distress.  Morbidly obese  HENT:  Head: Normocephalic and atraumatic.  Right Ear: External ear normal.  Left Ear: External ear normal.  Mouth/Throat: Oropharynx is clear and moist. No oropharyngeal exudate.  Eyes: Conjunctivae and EOM are normal. Pupils are equal, round, and reactive to light. Right eye exhibits no discharge. Left eye exhibits no discharge. No scleral icterus.  Neck: Normal range of motion. Neck supple.  Cardiovascular: Regular rhythm, normal heart sounds and intact distal pulses.   tachycardic  Pulmonary/Chest: Effort normal. No respiratory distress. She has no rhonchi. She has no rales.  Abdominal: Soft. Bowel sounds are normal. There is no tenderness. There is no rebound.  Musculoskeletal: Normal  range of motion. She exhibits no edema.  Lymphadenopathy:    She has cervical adenopathy.  Neurological: She is alert and oriented to person, place, and time.  Skin: Skin is warm. No rash noted. No erythema.  Psychiatric: She has a normal mood and affect. Her behavior is normal.    ED Course  Procedures (including critical care time)  Labs Reviewed  POCT I-STAT, CHEM 8 - Abnormal; Notable for the following:    BUN 4 (*)    Glucose, Bld 123 (*)    All other components within normal limits  POCT I-STAT TROPONIN I   Dg Chest Port 1 View  04/08/2012  *RADIOLOGY REPORT*  Clinical Data: Fever and cough.  PORTABLE CHEST - 1 VIEW  Comparison: No prior chest x-rays currently available.  Findings: Asymmetry in the perihilar regions with bronchial thickening present on the left and potentially early perihilar pneumonia.  No edema or pleural fluid is identified.  Heart size is at the upper limits of normal.  IMPRESSION: Asymmetric bronchial thickening in the left perihilar region with radiographic suspicion of potentially early perihilar pneumonia as well.   Original Report Authenticated By: Irish Lack, M.D.      No diagnosis found.  Pulse ox 97% room air normal Cardiac 85 sinus normal   After the initial evaluation I spent a considerable amount of time with the patient and her companion discussing the natural course of respiratory infections, the low probability for significant improvement after 24 hours of antibiotics, the reassuring aspect of fever that improves following Tylenol provision.   Date: 04/09/2012  Rate: 85  Rhythm: normal sinus rhythm  QRS Axis: left  Intervals: normal  ST/T Wave abnormalities: nonspecific T wave changes  Conduction Disutrbances:left bundle branch block  Narrative Interpretation:   Old EKG Reviewed: unchanged ABNORMAL   MDM  This patient presents for second time in 24 hours a concern of ongoing cough, congestion, generalized discomfort.  The  patient is in no distress on exam, borderline febrile, with appropriate vital signs.  Given the absence of significant progression, he continued appropriate response to Tylenol, the absence of distress, there is little suspicion for acute progression of the patient's illness, but his presentation seems consistent with ongoing respiratory infection.  After significant counseling, the patient was discharged with return precautions, PMD followup        Gerhard Munch, MD 04/09/12 2320

## 2012-04-09 NOTE — ED Notes (Signed)
Pt c/o yellow productive cough, fever/chill x 7 days.  Was seen here yesterday and dx with pneumonia.  Last took 2 extra strength Tylenol at 1830.

## 2012-04-10 LAB — CULTURE, RESPIRATORY W GRAM STAIN: Culture: NORMAL

## 2012-04-10 LAB — URINE CULTURE: Colony Count: 25000

## 2012-04-15 LAB — CULTURE, BLOOD (ROUTINE X 2)
Culture: NO GROWTH
Culture: NO GROWTH

## 2013-01-14 ENCOUNTER — Ambulatory Visit: Payer: BC Managed Care – PPO | Admitting: Cardiology

## 2013-09-23 ENCOUNTER — Encounter: Payer: Self-pay | Admitting: Internal Medicine

## 2013-11-23 ENCOUNTER — Encounter: Payer: Self-pay | Admitting: *Deleted

## 2013-12-05 ENCOUNTER — Ambulatory Visit: Payer: Managed Care, Other (non HMO) | Admitting: Family Medicine

## 2014-01-02 ENCOUNTER — Ambulatory Visit (HOSPITAL_COMMUNITY)
Admission: RE | Admit: 2014-01-02 | Discharge: 2014-01-02 | Disposition: A | Discharge status: Home / Self Care | Payer: Commercial Indemnity | Source: Ambulatory Visit | Attending: Cardiology | Admitting: Cardiology

## 2014-01-02 ENCOUNTER — Encounter: Payer: Self-pay | Admitting: Cardiology

## 2014-01-02 ENCOUNTER — Ambulatory Visit (INDEPENDENT_AMBULATORY_CARE_PROVIDER_SITE_OTHER): Payer: Commercial Indemnity | Admitting: Cardiology

## 2014-01-02 VITALS — BP 114/78 | HR 82 | Ht 64.0 in | Wt 256.0 lb

## 2014-01-02 DIAGNOSIS — I447 Left bundle-branch block, unspecified: Secondary | ICD-10-CM | POA: Insufficient documentation

## 2014-01-02 DIAGNOSIS — I10 Essential (primary) hypertension: Secondary | ICD-10-CM

## 2014-01-02 DIAGNOSIS — G4733 Obstructive sleep apnea (adult) (pediatric): Secondary | ICD-10-CM | POA: Insufficient documentation

## 2014-01-02 DIAGNOSIS — R0600 Dyspnea, unspecified: Secondary | ICD-10-CM | POA: Insufficient documentation

## 2014-01-02 DIAGNOSIS — R0602 Shortness of breath: Secondary | ICD-10-CM

## 2014-01-02 DIAGNOSIS — I359 Nonrheumatic aortic valve disorder, unspecified: Secondary | ICD-10-CM

## 2014-01-02 DIAGNOSIS — E785 Hyperlipidemia, unspecified: Secondary | ICD-10-CM | POA: Diagnosis not present

## 2014-01-02 LAB — BRAIN NATRIURETIC PEPTIDE: Pro B Natriuretic peptide (BNP): 8 pg/mL (ref 0.0–100.0)

## 2014-01-02 NOTE — Patient Instructions (Signed)
Your physician recommends that you continue on your current medications as directed. Please refer to the Current Medication list given to you today.  Lab Today:[facility]  Your physician has requested that you have an echocardiogram. Echocardiography is a painless test that uses sound waves to create images of your heart. It provides your doctor with information about the size and shape of your heart and how well your heart's chambers and valves are working. This procedure takes approximately one hour. There are no restrictions for this procedure.   Your physician wants you to follow-up in: 6 months with Dr.Turner You will receive a reminder letter in the mail two months in advance. If you don't receive a letter, please call our office to schedule the follow-up appointment.

## 2014-01-02 NOTE — Progress Notes (Signed)
[facility], [address] [place], [place]  [address] Phone: [phone] Fax:  [phone]  Date:  01/02/2014   ID:  [name], DOB 1956-01-05, MRN [id number]  PCP:  Odette Fraction, MD  Cardiologist:  Fransico Him, MD    History of Present Illness: This is a 58yo female with a history of HTN and LBBB who I saw a year ago for abnormal stress test done for LBBB.  She was completely asymptomatic and stress test showed a moderate defect in the inferior wall with very mild reversibility that was felt to be diaphragmatic and breast attenuation artifact.  She was continued on medical therapy since she was asymptomatic.  Of note her EF on nuclear stress test was 45%.  2D echo confirmed EF 45%.  She says that she has been having problems with SOB after taking the flu shot.  This has been going on for about 3 weeks.  She says that she gets headaches.  She has also had a cough and nasal congestion.  She has had some SOB.  She denies any chest pain.  She occasionally will have some mild LE edema.  Her SOB can occur with rest or with exertion.  She is also on CPAP therapy and doing well with the device.  She uses a nasal pillow mask with no chin strap and tolerates it well.  She does not snore.  She feels the pressure is not enough.  She feels rested in the am and has no daytime sleepiness.  Wt Readings from Last 3 Encounters:  01/02/14 256 lb (116.121 kg)  05/20/10 245 lb (111.131 kg)     Past Medical History  Diagnosis Date  . Hypertension   . Hyperlipidemia   . History of breast cancer   . Cataract   . Anxiety   . Neuropathy   . Carpal tunnel syndrome   . Low back pain   . Myalgia and myositis   . Cancer     left breast  . OSA on CPAP   . Left bundle branch block (LBBB)     Current Outpatient Prescriptions  Medication Sig Dispense Refill  . Cinnamon 500 MG capsule Take 1,000 mg by mouth daily.    . clobetasol cream (TEMOVATE) 3.73 % Apply 1 application topically 2 (two) times  daily as needed.    . fluvastatin XL (LESCOL XL) 80 MG 24 hr tablet Take 80 mg by mouth at bedtime.     Marland Kitchen ibuprofen (ADVIL,MOTRIN) 200 MG tablet Take 600 mg by mouth every 6 (six) hours as needed.    . NON FORMULARY Herbal Life    . Pyridoxine HCl (VITAMIN B-6) 250 MG tablet Take 250 mg by mouth daily.      Marland Kitchen telmisartan (MICARDIS) 80 MG tablet Take 80 mg by mouth daily.      . vitamin C (ASCORBIC ACID) 500 MG tablet Take 1,000 mg by mouth daily.     No current facility-administered medications for this visit.    Allergies:    Allergies  Allergen Reactions  . Atorvastatin     REACTION: mylagias  . Lipitor [Atorvastatin Calcium] Other (See Comments)    myalgia  . Pravachol Other (See Comments)    myalgia  . Pravastatin     REACTION: myalgias  . Pravastatin Sodium     REACTION: myalgias  . Sulfa Antibiotics Hives  . Sulfonamide Derivatives     REACTION: hives    Social History:  The patient  reports  that she quit smoking about 4 years ago. She has never used smokeless tobacco. She reports that she drinks alcohol. She reports that she does not use illicit drugs.   Family History:  The patient's family history includes Alcohol abuse in her father; Arthritis in her mother; Cancer in her father and paternal grandmother; Depression in her father and mother; Drug abuse in her brother; Early death in her maternal grandmother; Hearing loss in her mother; Hyperlipidemia in her mother; Hypertension in her father; Miscarriages / [place] in her mother.   ROS:  Please see the history of present illness.      All other systems reviewed and negative.   PHYSICAL EXAM: VS:  BP 114/78 mmHg  Pulse 82  Ht 5\' 4"  (1.626 m)  Wt 256 lb (116.121 kg)  BMI 43.92 kg/m2 Well nourished, well developed, in no acute distress HEENT: normal Neck: no JVD Cardiac:  normal S1, S2; RRR; no murmur Lungs:  clear to auscultation bilaterally, no wheezing, rhonchi or rales Abd: soft, nontender, no  hepatomegaly Ext: no edema Skin: warm and dry Neuro:  CNs 2-12 intact, no focal abnormalities noted  EKG:  NSR with LBBB     ASSESSMENT AND PLAN:  1. Chronic LBBB 2. Abnormal nuclear stress test in the past felt secondary to attenuation artifact. 3. HTN - well controlled - continue Micardis 4. 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   OSA on CPAP and tolerating well.  Her d/l today showed an AHI of 1.6/hr on 13cm H2O and 86% compliance in using more than 4 hours nightly. 6.   SOB which sounds like a viral syndrome with cough and head congestion.  I will check a BNP and 2D echo to reassess LVF.  Followup with me in 6 months  Signed, Fransico Him, MD [facility] 01/02/2014 8:58 AM

## 2014-01-02 NOTE — Progress Notes (Signed)
2D Echocardiogram Complete.  01/02/2014   Paulena Servais, RDCS  

## 2014-01-07 ENCOUNTER — Telehealth: Payer: Self-pay | Admitting: Cardiology

## 2014-01-07 NOTE — Telephone Encounter (Signed)
New message   Returning call back to nurse from yesterday.

## 2014-01-08 NOTE — Telephone Encounter (Signed)
Patient informed of ECHO results and verbal understanding expressed.   

## 2014-01-15 ENCOUNTER — Encounter: Payer: Self-pay | Admitting: Cardiology

## 2014-01-16 ENCOUNTER — Encounter: Payer: Self-pay | Admitting: Family Medicine

## 2014-01-16 ENCOUNTER — Ambulatory Visit (INDEPENDENT_AMBULATORY_CARE_PROVIDER_SITE_OTHER): Payer: Managed Care, Other (non HMO) | Admitting: Family Medicine

## 2014-01-16 VITALS — BP 132/80 | HR 80 | Temp 97.5°F | Resp 20 | Ht 64.0 in | Wt 259.0 lb

## 2014-01-16 DIAGNOSIS — Z0000 Encounter for general adult medical examination without abnormal findings: Secondary | ICD-10-CM

## 2014-01-16 LAB — COMPLETE METABOLIC PANEL WITH GFR
ALBUMIN: 4 g/dL (ref 3.5–5.2)
ALT: 20 U/L (ref 0–35)
AST: 15 U/L (ref 0–37)
Alkaline Phosphatase: 66 U/L (ref 39–117)
BILIRUBIN TOTAL: 0.4 mg/dL (ref 0.2–1.2)
BUN: 12 mg/dL (ref 6–23)
CO2: 26 meq/L (ref 19–32)
Calcium: 9.6 mg/dL (ref 8.4–10.5)
Chloride: 103 mEq/L (ref 96–112)
Creat: 0.6 mg/dL (ref 0.50–1.10)
GFR, Est African American: >89 mL/min
Glucose, Bld: 94 mg/dL (ref 70–99)
Potassium: 4.2 mEq/L (ref 3.5–5.3)
SODIUM: 140 meq/L (ref 135–145)
Total Protein: 7 g/dL (ref 6.0–8.3)

## 2014-01-16 LAB — CBC WITH DIFFERENTIAL/PLATELET
Basophils Absolute: 0 10*3/uL (ref 0.0–0.1)
Basophils Relative: 0 % (ref 0–1)
Eosinophils Absolute: 0.2 10*3/uL (ref 0.0–0.7)
Eosinophils Relative: 2 % (ref 0–5)
HEMATOCRIT: 40 % (ref 36.0–46.0)
Hemoglobin: 13.7 g/dL (ref 12.0–15.0)
LYMPHS ABS: 2.9 10*3/uL (ref 0.7–4.0)
LYMPHS PCT: 31 % (ref 12–46)
MCH: 31.1 pg (ref 26.0–34.0)
MCHC: 34.3 g/dL (ref 30.0–36.0)
MCV: 90.9 fL (ref 78.0–100.0)
MONO ABS: 0.7 10*3/uL (ref 0.1–1.0)
MONOS PCT: 7 % (ref 3–12)
MPV: 9.7 fL (ref 9.4–12.4)
Neutro Abs: 5.6 10*3/uL (ref 1.7–7.7)
Neutrophils Relative %: 60 % (ref 43–77)
Platelets: 332 10*3/uL (ref 150–400)
RBC: 4.4 MIL/uL (ref 3.87–5.11)
RDW: 14.1 % (ref 11.5–15.5)
WBC: 9.4 10*3/uL (ref 4.0–10.5)

## 2014-01-16 LAB — LIPID PANEL
CHOL/HDL RATIO: 2.6 ratio
CHOLESTEROL: 189 mg/dL (ref 0–200)
HDL: 74 mg/dL (ref >39)
LDL Cholesterol: 99 mg/dL (ref 0–99)
TRIGLYCERIDES: 82 mg/dL (ref <150)
VLDL: 16 mg/dL (ref 0–40)

## 2014-01-16 LAB — TSH: TSH: 1.329 u[IU]/mL (ref 0.350–4.500)

## 2014-01-16 NOTE — Progress Notes (Signed)
Subjective:    Patient ID: [name], female    DOB: 12/02/1955, 58 y.o.   MRN: [id number]  HPI  Patient is a very pleasant 58 year old African-American female who is here today to establish care and for physical exam. Her last colonoscopy was in 2006. She is due again next year. She does have a history of breast cancer status post surgical resection in her left breast. It is been more than 2 years since her last mammogram. It has been more than 3 years since her last Pap smear. Past medical history includes hypertension, hyperlipidemia, obstructive sleep apnea, and mildly suppressed ejection fraction of 45% due to left bundle branch block and hypertensive cardiomyopathy. Past Medical History  Diagnosis Date  . Hypertension   . Hyperlipidemia   . History of breast cancer   . Cataract   . Anxiety   . Neuropathy   . Carpal tunnel syndrome   . Low back pain   . Myalgia and myositis   . Cancer     left breast  . OSA on CPAP   . Left bundle branch block (LBBB)   . Left ventricular dysfunction     ef 45%   Past Surgical History  Procedure Laterality Date  . Cataract extraction w/ intraocular lens  implant, bilateral    . Lumpectomy left breast    . Dilation and curettage of uterus    . Eye surgery     Current Outpatient Prescriptions on File Prior to Visit  Medication Sig Dispense Refill  . Cinnamon 500 MG capsule Take 1,000 mg by mouth daily.    . clobetasol cream (TEMOVATE) 6.06 % Apply 1 application topically 2 (two) times daily as needed.    . fluvastatin XL (LESCOL XL) 80 MG 24 hr tablet Take 80 mg by mouth at bedtime. Name Brand Only    . ibuprofen (ADVIL,MOTRIN) 200 MG tablet Take 600 mg by mouth every 6 (six) hours as needed.    . NON FORMULARY Herbal Life    . Pyridoxine HCl (VITAMIN B-6) 250 MG tablet Take 250 mg by mouth daily.      Marland Kitchen telmisartan (MICARDIS) 80 MG tablet Take 80 mg by mouth daily. Name Brand Only    . vitamin C (ASCORBIC ACID) 500 MG tablet Take  1,000 mg by mouth daily.     No current facility-administered medications on file prior to visit.   Allergies  Allergen Reactions  . Atorvastatin     REACTION: mylagias  . Lipitor [Atorvastatin Calcium] Other (See Comments)    myalgia  . Pravachol Other (See Comments)    myalgia  . Pravastatin     REACTION: myalgias  . Pravastatin Sodium     REACTION: myalgias  . Sulfa Antibiotics Hives  . Sulfonamide Derivatives     REACTION: hives   History   Social History  . Marital Status: Significant Other    Spouse Name: N/A    Number of Children: N/A  . Years of Education: N/A   Occupational History  . Truck Geophysicist/field seismologist    Social History Main Topics  . Smoking status: Former Smoker    Quit date: 06/18/2009  . Smokeless tobacco: Never Used  . Alcohol Use: 0.0 oz/week     Comment: occasional use  . Drug Use: No  . Sexual Activity: Yes   Other Topics Concern  . Not on file   Social History Narrative   Family History  Problem Relation Age of Onset  . Arthritis  Mother   . Depression Mother   . Hearing loss Mother   . Hyperlipidemia Mother   . Miscarriages / [place] Mother   . Alcohol abuse Father   . Cancer Father   . Depression Father   . Hypertension Father   . Drug abuse Brother   . Early death Maternal Grandmother   . Cancer Paternal Grandmother      Review of Systems  All other systems reviewed and are negative.      Objective:   Physical Exam  Constitutional: She is oriented to person, place, and time. She appears well-developed and well-nourished. No distress.  HENT:  Head: Normocephalic and atraumatic.  Right Ear: External ear normal.  Left Ear: External ear normal.  Nose: Nose normal.  Mouth/Throat: Oropharynx is clear and moist. No oropharyngeal exudate.  Eyes: Conjunctivae and EOM are normal. Pupils are equal, round, and reactive to light. Right eye exhibits no discharge. Left eye exhibits no discharge. No scleral icterus.  Neck: Normal range  of motion. Neck supple. No JVD present. No tracheal deviation present. No thyromegaly present.  Cardiovascular: Normal rate, regular rhythm, normal heart sounds and intact distal pulses.  Exam reveals no gallop and no friction rub.   No murmur heard. Pulmonary/Chest: Effort normal and breath sounds normal. No stridor. No respiratory distress. She has no wheezes. She has no rales. She exhibits no tenderness.  Abdominal: Soft. Bowel sounds are normal. She exhibits no distension and no mass. There is no tenderness. There is no rebound and no guarding.  Genitourinary: Vagina normal and uterus normal. No vaginal discharge found.  Musculoskeletal: Normal range of motion. She exhibits no edema or tenderness.  Lymphadenopathy:    She has no cervical adenopathy.  Neurological: She is alert and oriented to person, place, and time. She has normal reflexes. She displays normal reflexes. No cranial nerve deficit. She exhibits normal muscle tone. Coordination normal.  Skin: Skin is warm. No rash noted. She is not diaphoretic. No erythema. No pallor.  Psychiatric: She has a normal mood and affect. Her behavior is normal. Judgment and thought content normal.  Vitals reviewed.         Assessment & Plan:  Routine general medical examination at a health care facility - Plan: COMPLETE METABOLIC PANEL WITH GFR, Lipid panel, TSH, CBC with Differential, Pap IG (Image Guided) [facility], Fecal occult blood, imunochemical, Fecal occult blood, imunochemical, Fecal occult blood, imunochemical  Patient's physical exam is significant for obesity. Her blood pressures well controlled. I will check a CMP, fasting lipid panel, TSH, CBC. Her Pap smear was sent to pathology. I will check fecal occult blood cards 3 to rule out colon cancer. I will also schedule the patient for mammogram. Regular anticipatory guidance was provided. Also recommended diet exercise and lifestyle changes to address her obesity.

## 2014-01-17 LAB — PAP IG (IMAGE GUIDED)

## 2014-01-20 ENCOUNTER — Encounter: Payer: Self-pay | Admitting: *Deleted

## 2014-01-20 ENCOUNTER — Encounter: Payer: Self-pay | Admitting: Family Medicine

## 2014-01-22 ENCOUNTER — Other Ambulatory Visit: Payer: Self-pay | Admitting: Family Medicine

## 2014-01-23 LAB — FECAL OCCULT BLOOD, IMMUNOCHEMICAL
FECAL OCCULT BLOOD: NEGATIVE
Fecal Occult Blood: NEGATIVE
Fecal Occult Blood: POSITIVE — AB

## 2014-01-24 ENCOUNTER — Other Ambulatory Visit: Payer: Self-pay | Admitting: Family Medicine

## 2014-01-24 DIAGNOSIS — Z1231 Encounter for screening mammogram for malignant neoplasm of breast: Secondary | ICD-10-CM

## 2014-02-18 ENCOUNTER — Emergency Department (HOSPITAL_COMMUNITY)
Admission: EM | Admit: 2014-02-18 | Discharge: 2014-02-18 | Disposition: A | Discharge status: Home / Self Care | Payer: Commercial Indemnity | Source: Home / Self Care | Attending: Family Medicine | Admitting: Family Medicine

## 2014-02-18 ENCOUNTER — Encounter (HOSPITAL_COMMUNITY): Payer: Self-pay | Admitting: *Deleted

## 2014-02-18 DIAGNOSIS — J069 Acute upper respiratory infection, unspecified: Secondary | ICD-10-CM

## 2014-02-18 MED ORDER — MINOCYCLINE HCL 100 MG PO CAPS: 100.0000 mg | ORAL_CAPSULE | Freq: Two times a day (BID) | ORAL | Status: DC

## 2014-02-18 MED ORDER — IPRATROPIUM BROMIDE 0.06 % NA SOLN: 2.0000 | Freq: Four times a day (QID) | NASAL | Status: DC

## 2014-02-18 NOTE — ED Provider Notes (Signed)
CSN: [id number]     Arrival date & time 02/18/14  1026 History   First MD Initiated Contact with Patient 02/18/14 1042     Chief Complaint  Patient presents with  . URI   (Consider location/radiation/quality/duration/timing/severity/associated sxs/prior Treatment) Patient is a 59 y.o. female presenting with URI. The history is provided by the patient.  URI Presenting symptoms: congestion, cough and rhinorrhea   Presenting symptoms: no fever and no sore throat   Severity:  Mild Onset quality:  Gradual Duration:  2 days Progression:  Unchanged Chronicity:  New Relieved by:  None tried Worsened by:  Nothing tried Risk factors: recent travel and sick contacts     Past Medical History  Diagnosis Date  . Hypertension   . Hyperlipidemia   . History of breast cancer   . Cataract   . Anxiety   . Neuropathy   . Carpal tunnel syndrome   . Low back pain   . Myalgia and myositis   . Cancer     left breast  . OSA on CPAP   . Left bundle branch block (LBBB)   . Left ventricular dysfunction     ef 45%   Past Surgical History  Procedure Laterality Date  . Cataract extraction w/ intraocular lens  implant, bilateral    . Lumpectomy left breast    . Dilation and curettage of uterus    . Eye surgery     Family History  Problem Relation Age of Onset  . Arthritis Mother   . Depression Mother   . Hearing loss Mother   . Hyperlipidemia Mother   . Miscarriages / [place] Mother   . Alcohol abuse Father   . Cancer Father   . Depression Father   . Hypertension Father   . Drug abuse Brother   . Early death Maternal Grandmother   . Cancer Paternal Grandmother    History  Substance Use Topics  . Smoking status: Former Smoker    Quit date: 06/18/2009  . Smokeless tobacco: Never Used  . Alcohol Use: 0.0 oz/week     Comment: occasional use   OB History    No data available     Review of Systems  Constitutional: Negative.  Negative for fever.  HENT: Positive for congestion,  postnasal drip and rhinorrhea. Negative for sore throat.   Respiratory: Positive for cough.   Cardiovascular: Negative.   Gastrointestinal: Negative.   Skin: Negative.     Allergies  Atorvastatin; Lipitor; Pravachol; Pravastatin; Pravastatin sodium; Sulfa antibiotics; and Sulfonamide derivatives  Home Medications   Prior to Admission medications   Medication Sig Start Date End Date Taking? Authorizing Provider  Cinnamon 500 MG capsule Take 1,000 mg by mouth daily.    Historical Provider, MD  clobetasol cream (TEMOVATE) 9.62 % Apply 1 application topically 2 (two) times daily as needed.    Historical Provider, MD  fluvastatin XL (LESCOL XL) 80 MG 24 hr tablet Take 80 mg by mouth at bedtime. Name Brand Only    Historical Provider, MD  ibuprofen (ADVIL,MOTRIN) 200 MG tablet Take 600 mg by mouth every 6 (six) hours as needed.    Historical Provider, MD  ipratropium (ATROVENT) 0.06 % nasal spray Place 2 sprays into both nostrils 4 (four) times daily. 02/18/14   Billy Fischer, MD  minocycline (MINOCIN,DYNACIN) 100 MG capsule Take 1 capsule (100 mg total) by mouth 2 (two) times daily. 02/18/14   Billy Fischer, MD  NON FORMULARY Herbal Life    Historical  Provider, MD  Pyridoxine HCl (VITAMIN B-6) 250 MG tablet Take 250 mg by mouth daily.      Historical Provider, MD  telmisartan (MICARDIS) 80 MG tablet Take 80 mg by mouth daily. Name Brand Only    Historical Provider, MD  vitamin C (ASCORBIC ACID) 500 MG tablet Take 1,000 mg by mouth daily.    Historical Provider, MD   BP 110/65 mmHg  Pulse 78  Temp(Src) 98.3 F (36.8 C) (Oral)  Resp 20  SpO2 97% Physical Exam  Constitutional: She is oriented to person, place, and time. She appears well-developed and well-nourished.  HENT:  Head: Normocephalic.  Right Ear: External ear normal.  Left Ear: External ear normal.  Nose: Mucosal edema and rhinorrhea present.  Mouth/Throat: Oropharynx is clear and moist.  Eyes: Conjunctivae are normal. Pupils are  equal, round, and reactive to light.  Neck: Normal range of motion. Neck supple.  Cardiovascular: Normal rate, regular rhythm, normal heart sounds and intact distal pulses.   Pulmonary/Chest: Effort normal and breath sounds normal.  Lymphadenopathy:    She has no cervical adenopathy.  Neurological: She is alert and oriented to person, place, and time.  Skin: Skin is warm and dry.  Nursing note and vitals reviewed.   ED Course  Procedures (including critical care time) Labs Review Labs Reviewed - No data to display  Imaging Review No results found.   MDM   1. URI (upper respiratory infection)        Billy Fischer, MD 02/18/14 [phone]

## 2014-02-18 NOTE — ED Notes (Signed)
Pt reports      Symptoms  Of      Cough    /  Congested      As  Well  As   Sinus  Pressure  And  Drainage               With  Symptoms         X  2   Days                 The  Cough  Is  For  The  Most  Part  Non  Productive

## 2014-03-17 ENCOUNTER — Telehealth: Payer: Self-pay | Admitting: Family Medicine

## 2014-03-17 NOTE — Telephone Encounter (Signed)
[phone]  PT is calling because she is wanting to start using the [employer] home delivery for her rx (she will need new rx for her medication for 90 days) their phone number is [phone]  She is needing the name brands of the medications Lescol 80 MG Micardis 80 MG

## 2014-03-19 MED ORDER — FLUVASTATIN SODIUM ER 80 MG PO TB24: 80.0000 mg | ORAL_TABLET | Freq: Every day | ORAL | Status: DC

## 2014-03-19 MED ORDER — TELMISARTAN 80 MG PO TABS: 80.0000 mg | ORAL_TABLET | Freq: Every day | ORAL | Status: DC

## 2014-03-19 NOTE — Telephone Encounter (Signed)
Meds sent to requested pharm for name brand only

## 2014-04-02 ENCOUNTER — Encounter: Payer: Self-pay | Admitting: Cardiology

## 2014-05-14 ENCOUNTER — Encounter (HOSPITAL_COMMUNITY): Payer: Self-pay

## 2014-05-14 ENCOUNTER — Emergency Department (HOSPITAL_COMMUNITY)
Admission: EM | Admit: 2014-05-14 | Discharge: 2014-05-14 | Discharge status: Absent without leave | Payer: Self-pay | Source: Home / Self Care

## 2014-05-14 ENCOUNTER — Emergency Department (HOSPITAL_COMMUNITY)
Admission: EM | Admit: 2014-05-14 | Discharge: 2014-05-14 | Disposition: A | Discharge status: Home / Self Care | Payer: Managed Care, Other (non HMO) | Source: Home / Self Care | Attending: Family Medicine | Admitting: Family Medicine

## 2014-05-14 ENCOUNTER — Emergency Department (INDEPENDENT_AMBULATORY_CARE_PROVIDER_SITE_OTHER): Payer: Managed Care, Other (non HMO)

## 2014-05-14 DIAGNOSIS — J40 Bronchitis, not specified as acute or chronic: Secondary | ICD-10-CM

## 2014-05-14 MED ORDER — GUAIFENESIN-CODEINE 100-10 MG/5ML PO SOLN: 5.0000 mL | Freq: Every evening | ORAL | Status: DC | PRN

## 2014-05-14 MED ORDER — IPRATROPIUM-ALBUTEROL 0.5-2.5 (3) MG/3ML IN SOLN
RESPIRATORY_TRACT | Status: AC
  Filled 2014-05-14: qty 3

## 2014-05-14 MED ORDER — ALBUTEROL SULFATE HFA 108 (90 BASE) MCG/ACT IN AERS: 2.0000 | INHALATION_SPRAY | Freq: Four times a day (QID) | RESPIRATORY_TRACT | Status: DC | PRN

## 2014-05-14 MED ORDER — PREDNISONE 10 MG PO TABS: 30.0000 mg | ORAL_TABLET | Freq: Every day | ORAL | Status: DC

## 2014-05-14 MED ORDER — IPRATROPIUM-ALBUTEROL 0.5-2.5 (3) MG/3ML IN SOLN
3.0000 mL | Freq: Once | RESPIRATORY_TRACT | Status: AC
  Administered 2014-05-14: 3 mL via RESPIRATORY_TRACT

## 2014-05-14 MED ORDER — FLUTICASONE PROPIONATE 50 MCG/ACT NA SUSP: 2.0000 | Freq: Every day | NASAL | Status: DC

## 2014-05-14 NOTE — ED Notes (Addendum)
C/o cough, fever, chills, fever. "Twice before when I felt like this, I had pneumonia"

## 2014-05-14 NOTE — ED Provider Notes (Signed)
[name] is a 59 y.o. female who presents to Urgent Care today for cough body aches chills fever headaches sore throat. Symptoms present for 3 days. Patient has tried Tylenol which helps some. No vomiting diarrhea chest pain or palpitations. Patient does note some wheezing and chest tightness. Her symptoms are consistent with previous episodes of pneumonia.   Past Medical History  Diagnosis Date  . Hypertension   . Hyperlipidemia   . History of breast cancer   . Cataract   . Anxiety   . Neuropathy   . Carpal tunnel syndrome   . Low back pain   . Myalgia and myositis   . Cancer     left breast  . OSA on CPAP   . Left bundle branch block (LBBB)   . Left ventricular dysfunction     ef 45%   Past Surgical History  Procedure Laterality Date  . Cataract extraction w/ intraocular lens  implant, bilateral    . Lumpectomy left breast    . Dilation and curettage of uterus    . Eye surgery     History  Substance Use Topics  . Smoking status: Former Smoker    Quit date: 06/18/2009  . Smokeless tobacco: Never Used  . Alcohol Use: 0.0 oz/week     Comment: occasional use   ROS as above Medications: No current facility-administered medications for this encounter.   Current Outpatient Prescriptions  Medication Sig Dispense Refill  . fluvastatin XL (LESCOL XL) 80 MG 24 hr tablet Take 1 tablet (80 mg total) by mouth at bedtime. Name Brand Only 90 tablet 2  . telmisartan (MICARDIS) 80 MG tablet Take 1 tablet (80 mg total) by mouth daily. Name Brand Only 90 tablet 2  . albuterol (PROVENTIL HFA;VENTOLIN HFA) 108 (90 BASE) MCG/ACT inhaler Inhale 2 puffs into the lungs every 6 (six) hours as needed for wheezing or shortness of breath. 1 Inhaler 2  . Cinnamon 500 MG capsule Take 1,000 mg by mouth daily.    . clobetasol cream (TEMOVATE) 6.07 % Apply 1 application topically 2 (two) times daily as needed.    . fluticasone (FLONASE) 50 MCG/ACT nasal spray Place 2 sprays into both nostrils  daily. 16 g 2  . guaiFENesin-codeine 100-10 MG/5ML syrup Take 5 mLs by mouth at bedtime as needed for cough. 120 mL 0  . ibuprofen (ADVIL,MOTRIN) 200 MG tablet Take 600 mg by mouth every 6 (six) hours as needed.    Marland Kitchen ipratropium (ATROVENT) 0.06 % nasal spray Place 2 sprays into both nostrils 4 (four) times daily. 15 mL 1  . minocycline (MINOCIN,DYNACIN) 100 MG capsule Take 1 capsule (100 mg total) by mouth 2 (two) times daily. 20 capsule 0  . NON FORMULARY Herbal Life    . predniSONE (DELTASONE) 10 MG tablet Take 3 tablets (30 mg total) by mouth daily. 15 tablet 0  . Pyridoxine HCl (VITAMIN B-6) 250 MG tablet Take 250 mg by mouth daily.      . vitamin C (ASCORBIC ACID) 500 MG tablet Take 1,000 mg by mouth daily.     Allergies  Allergen Reactions  . Atorvastatin     REACTION: mylagias  . Lipitor [Atorvastatin Calcium] Other (See Comments)    myalgia  . Pravachol Other (See Comments)    myalgia  . Pravastatin     REACTION: myalgias  . Pravastatin Sodium     REACTION: myalgias  . Sulfa Antibiotics Hives  . Sulfonamide Derivatives     REACTION: hives  Exam:  BP 94/69 mmHg  Pulse 90  Temp(Src) 100.1 F (37.8 C) (Oral)  Resp 20  SpO2 95% Gen: Well NAD HEENT: EOMI,  MMM normal posterior pharynx and tympanic membranes bilaterally.  Lungs: Normal work of breathing. CTABL Heart: RRR no MRG Abd: NABS, Soft. Nondistended, Nontender Exts: Brisk capillary refill, warm and well perfused.   Patient was given a 2.5/0.5 mg DuoNeb nebulizer treatment, and felt better  No results found for this or any previous visit (from the past 24 hour(s)). Dg Chest 2 View  05/14/2014   CLINICAL DATA:  Initial encounter for two-day history of coughing congestion.  EXAM: CHEST  2 VIEW  COMPARISON:  04/09/2012.  FINDINGS: The lungs are clear without focal infiltrate, edema, pneumothorax or pleural effusion. The cardio pericardial silhouette is enlarged. Surgical clips are noted in the left axilla Imaged  bony structures of the thorax are intact.  IMPRESSION: No acute cardiopulmonary findings.   Electronically Signed   By: Misty Stanley M.D.   On: 05/14/2014 18:36    Assessment and Plan: 59 y.o. female with bronchitis. Treat with prednisone and albuterol codeine cough syrup and Flonase nasal spray.  Discussed warning signs or symptoms. Please see discharge instructions. Patient expresses understanding.     Gregor Hams, MD 05/14/14 [phone]

## 2014-05-14 NOTE — Discharge Instructions (Signed)
Thank you for coming in today. °Call or go to the emergency room if you get worse, have trouble breathing, have chest pains, or palpitations.  ° °Acute Bronchitis °Bronchitis is inflammation of the airways that extend from the windpipe into the lungs (bronchi). The inflammation often causes mucus to develop. This leads to a cough, which is the most common symptom of bronchitis.  °In acute bronchitis, the condition usually develops suddenly and goes away over time, usually in a couple weeks. Smoking, allergies, and asthma can make bronchitis worse. Repeated episodes of bronchitis may cause further lung problems.  °CAUSES °Acute bronchitis is most often caused by the same virus that causes a cold. The virus can spread from person to person (contagious) through coughing, sneezing, and touching contaminated objects. °SIGNS AND SYMPTOMS  °· Cough.   °· Fever.   °· Coughing up mucus.   °· Body aches.   °· Chest congestion.   °· Chills.   °· Shortness of breath.   °· Sore throat.   °DIAGNOSIS  °Acute bronchitis is usually diagnosed through a physical exam. Your health care provider will also ask you questions about your medical history. Tests, such as chest X-rays, are sometimes done to rule out other conditions.  °TREATMENT  °Acute bronchitis usually goes away in a couple weeks. Oftentimes, no medical treatment is necessary. Medicines are sometimes given for relief of fever or cough. Antibiotic medicines are usually not needed but may be prescribed in certain situations. In some cases, an inhaler may be recommended to help reduce shortness of breath and control the cough. A cool mist vaporizer may also be used to help thin bronchial secretions and make it easier to clear the chest.  °HOME CARE INSTRUCTIONS °· Get plenty of rest.   °· Drink enough fluids to keep your urine clear or pale yellow (unless you have a medical condition that requires fluid restriction). Increasing fluids may help thin your respiratory secretions  (sputum) and reduce chest congestion, and it will prevent dehydration.   °· Take medicines only as directed by your health care provider. °· If you were prescribed an antibiotic medicine, finish it all even if you start to feel better. °· Avoid smoking and secondhand smoke. Exposure to cigarette smoke or irritating chemicals will make bronchitis worse. If you are a smoker, consider using nicotine gum or skin patches to help control withdrawal symptoms. Quitting smoking will help your lungs heal faster.   °· Reduce the chances of another bout of acute bronchitis by washing your hands frequently, avoiding people with cold symptoms, and trying not to touch your hands to your mouth, nose, or eyes.   °· Keep all follow-up visits as directed by your health care provider.   °SEEK MEDICAL CARE IF: °Your symptoms do not improve after 1 week of treatment.  °SEEK IMMEDIATE MEDICAL CARE IF: °· You develop an increased fever or chills.   °· You have chest pain.   °· You have severe shortness of breath. °· You have bloody sputum.   °· You develop dehydration. °· You faint or repeatedly feel like you are going to pass out. °· You develop repeated vomiting. °· You develop a severe headache. °MAKE SURE YOU:  °· Understand these instructions. °· Will watch your condition. °· Will get help right away if you are not doing well or get worse. °Document Released: 03/10/2004 Document Revised: 06/17/2013 Document Reviewed: 07/24/2012 °Exit[employer]® Patient Information ©2015 [employer]. This information is not intended to replace advice given to you by your health care provider. Make sure you discuss any questions you have with your   health care provider. ° °

## 2014-06-09 ENCOUNTER — Telehealth: Payer: Self-pay | Admitting: Family Medicine

## 2014-06-09 MED ORDER — TELMISARTAN 80 MG PO TABS: 80.0000 mg | ORAL_TABLET | Freq: Every day | ORAL | Status: DC

## 2014-06-09 NOTE — Telephone Encounter (Signed)
Medication refilled per protocol. 

## 2014-06-19 ENCOUNTER — Encounter: Payer: Self-pay | Admitting: Physician Assistant

## 2014-06-19 ENCOUNTER — Encounter: Payer: Self-pay | Admitting: Family Medicine

## 2014-06-19 ENCOUNTER — Ambulatory Visit (INDEPENDENT_AMBULATORY_CARE_PROVIDER_SITE_OTHER): Payer: Managed Care, Other (non HMO) | Admitting: Physician Assistant

## 2014-06-19 VITALS — BP 120/74 | HR 72 | Temp 97.3°F | Resp 20 | Wt 262.0 lb

## 2014-06-19 DIAGNOSIS — B9689 Other specified bacterial agents as the cause of diseases classified elsewhere: Principal | ICD-10-CM

## 2014-06-19 DIAGNOSIS — J988 Other specified respiratory disorders: Secondary | ICD-10-CM

## 2014-06-19 MED ORDER — AZITHROMYCIN 250 MG PO TABS: ORAL_TABLET | ORAL | Status: DC

## 2014-06-19 NOTE — Progress Notes (Signed)
Patient ID: [name] MRN: [id number], DOB: 08/22/1955, 59 y.o. Date of Encounter: 06/19/2014, 1:51 PM    Chief Complaint:  Chief Complaint  Patient presents with  . sick again    diag with bronchitis at [facility] on 4/30     HPI: 59 y.o. year old  female presents with above.--However the date of her urgent care visit was 05/14/14--not 4/30.  Today I have reviewed that urgent care note from 05/14/14. She was treated with nebulizer treatment there at the urgent care and then was treated with prednisone taper. Patient states that all of those symptoms had resolved and she had returned to normal state of health and remained "normal" for 3 weeks. Says that she's been having these current symptoms for about one week. Says that she is having chest congestion and cough with phlegm that is thick and dark. No has nasal congestion and thick dark mucus from her nose. No significant sore throat. No earache. No fevers or chills.     Home Meds:   Outpatient Prescriptions Prior to Visit  Medication Sig Dispense Refill  . albuterol (PROVENTIL HFA;VENTOLIN HFA) 108 (90 BASE) MCG/ACT inhaler Inhale 2 puffs into the lungs every 6 (six) hours as needed for wheezing or shortness of breath. 1 Inhaler 2  . Cinnamon 500 MG capsule Take 1,000 mg by mouth daily.    . clobetasol cream (TEMOVATE) 9.98 % Apply 1 application topically 2 (two) times daily as needed.    . fluticasone (FLONASE) 50 MCG/ACT nasal spray Place 2 sprays into both nostrils daily. 16 g 2  . fluvastatin XL (LESCOL XL) 80 MG 24 hr tablet Take 1 tablet (80 mg total) by mouth at bedtime. Name Brand Only 90 tablet 2  . guaiFENesin-codeine 100-10 MG/5ML syrup Take 5 mLs by mouth at bedtime as needed for cough. 120 mL 0  . ibuprofen (ADVIL,MOTRIN) 200 MG tablet Take 600 mg by mouth every 6 (six) hours as needed.    . minocycline (MINOCIN,DYNACIN) 100 MG capsule Take 1 capsule (100 mg total) by mouth 2 (two) times daily. 20 capsule 0  . NON  FORMULARY Herbal Life    . Pyridoxine HCl (VITAMIN B-6) 250 MG tablet Take 250 mg by mouth daily.      Marland Kitchen telmisartan (MICARDIS) 80 MG tablet Take 1 tablet (80 mg total) by mouth daily. Name Brand Only 90 tablet 0  . vitamin C (ASCORBIC ACID) 500 MG tablet Take 1,000 mg by mouth daily.    Marland Kitchen ipratropium (ATROVENT) 0.06 % nasal spray Place 2 sprays into both nostrils 4 (four) times daily. (Patient not taking: Reported on 06/19/2014) 15 mL 1  . predniSONE (DELTASONE) 10 MG tablet Take 3 tablets (30 mg total) by mouth daily. (Patient not taking: Reported on 06/19/2014) 15 tablet 0   No facility-administered medications prior to visit.    Allergies:  Allergies  Allergen Reactions  . Atorvastatin     REACTION: mylagias  . Lipitor [Atorvastatin Calcium] Other (See Comments)    myalgia  . Pravachol Other (See Comments)    myalgia  . Pravastatin     REACTION: myalgias  . Pravastatin Sodium     REACTION: myalgias  . Sulfa Antibiotics Hives  . Sulfonamide Derivatives     REACTION: hives      Review of Systems: See HPI for pertinent ROS. All other ROS negative.    Physical Exam: Blood pressure 120/74, pulse 72, temperature 97.3 F (36.3 C), temperature source Oral, resp. rate 20,  weight 262 lb (118.842 kg)., Body mass index is 44.95 kg/(m^2). General:  Obese female . Appears in no acute distress. HEENT: Normocephalic, atraumatic, eyes without discharge, sclera non-icteric, nares are without discharge. Bilateral auditory canals clear, TM's are without perforation, pearly grey and translucent with reflective cone of light bilaterally. Oral cavity moist, posterior pharynx without exudate, erythema, peritonsillar abscess. No tenderness with percussion of frontal or maxillary sinuses bilaterally.  Neck: Supple. No thyromegaly. No lymphadenopathy. Lungs: Clear bilaterally to auscultation without wheezes, rales, or rhonchi. Breathing is unlabored. Lungs are  clear. I hear no wheezes no rhonchi or rales  and she has good air movement. Heart: Regular rhythm. No murmurs, rubs, or gallops. Msk:  Strength and tone normal for age. Extremities/Skin: Warm and dry. Neuro: Alert and oriented X 3. Moves all extremities spontaneously. Gait is normal. CNII-XII grossly in tact. Psych:  Responds to questions appropriately with a normal affect.     ASSESSMENT AND PLAN:  59 y.o. year old female with  1. Bacterial respiratory infection Take antibiotics as directed. Recommend Mucinex DM as expectorant. Note for out of work today and tomorrow. Follow-up if symptoms do not resolve within 1 week after completion of antibiotic. - azithromycin (ZITHROMAX) 250 MG tablet; Day 1: Take 2 daily.  Days 2-5: Take 1 daily.  Dispense: 6 tablet; Refill: 0   Signed, [address] [place], [place], [facility] 06/19/2014 1:51 PM

## 2014-08-05 ENCOUNTER — Telehealth: Payer: Self-pay | Admitting: Family Medicine

## 2014-08-05 NOTE — Telephone Encounter (Signed)
Patient calling about a bill she received about her lab work and being double billed  [phone]

## 2014-09-12 ENCOUNTER — Other Ambulatory Visit: Payer: Self-pay | Admitting: Family Medicine

## 2014-09-12 NOTE — Telephone Encounter (Signed)
Medication refilled per protocol. 

## 2014-09-12 NOTE — Telephone Encounter (Signed)
Duplicate request

## 2014-12-04 ENCOUNTER — Ambulatory Visit (INDEPENDENT_AMBULATORY_CARE_PROVIDER_SITE_OTHER): Payer: Managed Care, Other (non HMO) | Admitting: Family Medicine

## 2014-12-04 ENCOUNTER — Encounter: Payer: Self-pay | Admitting: Family Medicine

## 2014-12-04 VITALS — BP 118/78 | HR 84 | Temp 98.1°F | Resp 22 | Ht 64.0 in | Wt 247.0 lb

## 2014-12-04 DIAGNOSIS — J019 Acute sinusitis, unspecified: Secondary | ICD-10-CM | POA: Diagnosis not present

## 2014-12-04 MED ORDER — CEFDINIR 300 MG PO CAPS: 300.0000 mg | ORAL_CAPSULE | Freq: Two times a day (BID) | ORAL | Status: DC

## 2014-12-04 NOTE — Progress Notes (Signed)
Subjective:    Patient ID: [name], female    DOB: 1955/08/13, 59 y.o.   MRN: [id number]  HPI Patient has significant sinus inflammation. She has pain in both maxillary and both frontal sinuses. She has eustachian tube dysfunction with pain in both the ears. She has an extremely nasal voice because she cannot even breathe through her nasal passages.  She has thick postnasal drip. She is blowing purulent discharge out of her nose whenever she sneezes or blows her nose. She also has a severe sore throat. There is significant erythema and edema in her posterior oropharynx. She has tender lymphadenopathy bilaterally in the anterior cervical chains. She denies any cough or shortness of breath or chest pain or stridor Past Medical History  Diagnosis Date  . Hypertension   . Hyperlipidemia   . History of breast cancer   . Cataract   . Anxiety   . Neuropathy ([place])   . Carpal tunnel syndrome   . Low back pain   . Myalgia and myositis   . Cancer ([place])     left breast  . OSA on CPAP   . Left bundle branch block (LBBB)   . Left ventricular dysfunction     ef 45%   Past Surgical History  Procedure Laterality Date  . Cataract extraction w/ intraocular lens  implant, bilateral    . Lumpectomy left breast    . Dilation and curettage of uterus    . Eye surgery     Current Outpatient Prescriptions on File Prior to Visit  Medication Sig Dispense Refill  . albuterol (PROVENTIL HFA;VENTOLIN HFA) 108 (90 BASE) MCG/ACT inhaler Inhale 2 puffs into the lungs every 6 (six) hours as needed for wheezing or shortness of breath. 1 Inhaler 2  . azithromycin (ZITHROMAX) 250 MG tablet Day 1: Take 2 daily.  Days 2-5: Take 1 daily. 6 tablet 0  . Cinnamon 500 MG capsule Take 1,000 mg by mouth daily.    . clobetasol cream (TEMOVATE) 8.41 % Apply 1 application topically 2 (two) times daily as needed.    . fluticasone (FLONASE) 50 MCG/ACT nasal spray Place 2 sprays into both nostrils daily. 16 g 2  .  fluvastatin XL (LESCOL XL) 80 MG 24 hr tablet Take 1 tablet (80 mg total) by mouth at bedtime. Name Brand Only 90 tablet 2  . guaiFENesin-codeine 100-10 MG/5ML syrup Take 5 mLs by mouth at bedtime as needed for cough. 120 mL 0  . ibuprofen (ADVIL,MOTRIN) 200 MG tablet Take 600 mg by mouth every 6 (six) hours as needed.    Marland Kitchen MICARDIS 80 MG tablet TAKE ONE TABLET BY MOUTH ONCE DAILY 90 tablet 1  . NON FORMULARY Herbal Life    . Pyridoxine HCl (VITAMIN B-6) 250 MG tablet Take 250 mg by mouth daily.      . vitamin C (ASCORBIC ACID) 500 MG tablet Take 1,000 mg by mouth daily.     No current facility-administered medications on file prior to visit.   Allergies  Allergen Reactions  . Atorvastatin     REACTION: mylagias  . Lipitor [Atorvastatin Calcium] Other (See Comments)    myalgia  . Pravachol Other (See Comments)    myalgia  . Pravastatin     REACTION: myalgias  . Pravastatin Sodium     REACTION: myalgias  . Sulfa Antibiotics Hives  . Sulfonamide Derivatives     REACTION: hives   Social History   Social History  . Marital Status: Significant Other  Spouse Name: N/A  . Number of Children: N/A  . Years of Education: N/A   Occupational History  . Truck Geophysicist/field seismologist    Social History Main Topics  . Smoking status: Former Smoker    Quit date: 06/18/2009  . Smokeless tobacco: Never Used  . Alcohol Use: 0.0 oz/week     Comment: occasional use  . Drug Use: No  . Sexual Activity: Yes   Other Topics Concern  . Not on file   Social History Narrative      Review of Systems  All other systems reviewed and are negative.      Objective:   Physical Exam  Constitutional: She appears well-developed and well-nourished.  HENT:  Right Ear: Tympanic membrane, external ear and ear canal normal.  Left Ear: Tympanic membrane, external ear and ear canal normal.  Nose: Mucosal edema and rhinorrhea present. Right sinus exhibits maxillary sinus tenderness and frontal sinus tenderness.  Left sinus exhibits maxillary sinus tenderness and frontal sinus tenderness.  Mouth/Throat: Posterior oropharyngeal edema and posterior oropharyngeal erythema present. No oropharyngeal exudate or tonsillar abscesses.  Neck: Neck supple.  Cardiovascular: Normal rate, regular rhythm and normal heart sounds.   Pulmonary/Chest: Effort normal and breath sounds normal. No stridor. No respiratory distress. She has no wheezes. She has no rales.  Lymphadenopathy:    She has cervical adenopathy.  Vitals reviewed.         Assessment & Plan:  Acute rhinosinusitis - Plan: cefdinir (OMNICEF) 300 MG capsule  Again Omnicef 300 mg by mouth twice a day for 10 days. Recheck in 48 hours if no better or sooner if worse

## 2014-12-29 ENCOUNTER — Telehealth: Payer: Self-pay | Admitting: Family Medicine

## 2014-12-29 NOTE — Telephone Encounter (Signed)
PHARMACY: [facility]  MEDICATION: LESCOL XL   QTY:    SIG:    PHYSICIAN: PICKARD   PT. PHONE #: [phone]

## 2015-01-12 ENCOUNTER — Telehealth: Payer: Self-pay | Admitting: Family Medicine

## 2015-01-12 MED ORDER — FLUVASTATIN SODIUM ER 80 MG PO TB24: 80.0000 mg | ORAL_TABLET | Freq: Every day | ORAL | Status: DC

## 2015-01-12 NOTE — Telephone Encounter (Signed)
Called and spoke to pt to schedule an ov and she informed me that she travels and she will call back to set up a CPE - will refill medication for 3 additional months but no further refills without OV. Med sent to pharm

## 2015-01-12 NOTE — Telephone Encounter (Signed)
Please call patient about her lescol, she is out  [phone]

## 2015-01-31 ENCOUNTER — Emergency Department (HOSPITAL_COMMUNITY)
Admission: EM | Admit: 2015-01-31 | Discharge: 2015-01-31 | Disposition: A | Discharge status: Home / Self Care | Payer: Managed Care, Other (non HMO) | Source: Home / Self Care | Attending: Emergency Medicine | Admitting: Emergency Medicine

## 2015-01-31 ENCOUNTER — Encounter (HOSPITAL_COMMUNITY): Payer: Self-pay | Admitting: Emergency Medicine

## 2015-01-31 DIAGNOSIS — J9801 Acute bronchospasm: Secondary | ICD-10-CM | POA: Diagnosis not present

## 2015-01-31 DIAGNOSIS — J3489 Other specified disorders of nose and nasal sinuses: Secondary | ICD-10-CM | POA: Diagnosis not present

## 2015-01-31 DIAGNOSIS — J069 Acute upper respiratory infection, unspecified: Secondary | ICD-10-CM | POA: Diagnosis not present

## 2015-01-31 DIAGNOSIS — R05 Cough: Secondary | ICD-10-CM | POA: Diagnosis not present

## 2015-01-31 DIAGNOSIS — R059 Cough, unspecified: Secondary | ICD-10-CM

## 2015-01-31 MED ORDER — ALBUTEROL SULFATE HFA 108 (90 BASE) MCG/ACT IN AERS: 2.0000 | INHALATION_SPRAY | RESPIRATORY_TRACT | Status: DC | PRN

## 2015-01-31 MED ORDER — IPRATROPIUM BROMIDE 0.06 % NA SOLN: 2.0000 | Freq: Four times a day (QID) | NASAL | Status: DC

## 2015-01-31 NOTE — ED Notes (Signed)
C/o cold sx onset 3 days Sx include hoarseness, HA, congestion, prod cough, runny nose, and SOB Denies fevers  Taking OTC cold meds w/no relief A&O x4... No acute distress.

## 2015-01-31 NOTE — ED Provider Notes (Signed)
CSN: [id number]     Arrival date & time 01/31/15  1304 History   First MD Initiated Contact with Patient 01/31/15 1348     Chief Complaint  Patient presents with  . URI   (Consider location/radiation/quality/duration/timing/severity/associated sxs/prior Treatment) HPI Comments: 59 year old female complaining of nasal stuffiness, cough, PND, runny nose and fullness in the years for 3 days. Denies earache. Denies fever. She states she does feel short of breath sometimes.  Patient is a 59 y.o. female presenting with URI.  URI Presenting symptoms: congestion, cough, fatigue and rhinorrhea   Presenting symptoms: no ear pain and no fever   Associated symptoms: no neck pain     Past Medical History  Diagnosis Date  . Hypertension   . Hyperlipidemia   . History of breast cancer   . Cataract   . Anxiety   . Neuropathy ([place])   . Carpal tunnel syndrome   . Low back pain   . Myalgia and myositis   . Cancer ([place])     left breast  . OSA on CPAP   . Left bundle branch block (LBBB)   . Left ventricular dysfunction     ef 45%   Past Surgical History  Procedure Laterality Date  . Cataract extraction w/ intraocular lens  implant, bilateral    . Lumpectomy left breast    . Dilation and curettage of uterus    . Eye surgery     Family History  Problem Relation Age of Onset  . Arthritis Mother   . Depression Mother   . Hearing loss Mother   . Hyperlipidemia Mother   . Miscarriages / [place] Mother   . Alcohol abuse Father   . Cancer Father   . Depression Father   . Hypertension Father   . Drug abuse Brother   . Early death Maternal Grandmother   . Cancer Paternal Grandmother    Social History  Substance Use Topics  . Smoking status: Former Smoker    Quit date: 06/18/2009  . Smokeless tobacco: Never Used  . Alcohol Use: 0.0 oz/week     Comment: occasional use   OB History    No data available     Review of Systems  Constitutional: Positive for fatigue. Negative for  fever, chills, activity change and appetite change.  HENT: Positive for congestion, postnasal drip, rhinorrhea, sinus pressure and voice change. Negative for ear pain and facial swelling.   Eyes: Negative.   Respiratory: Positive for cough and shortness of breath.   Cardiovascular: Negative.   Gastrointestinal: Negative.   Genitourinary: Negative.   Musculoskeletal: Negative for neck pain and neck stiffness.  Skin: Negative for pallor and rash.  Neurological: Negative.     Allergies  Atorvastatin; Lipitor; Pravachol; Pravastatin; Pravastatin sodium; Sulfa antibiotics; and Sulfonamide derivatives  Home Medications   Prior to Admission medications   Medication Sig Start Date End Date Taking? Authorizing Provider  fluvastatin XL (LESCOL XL) 80 MG 24 hr tablet Take 1 tablet (80 mg total) by mouth at bedtime. Name Brand Only 01/12/15  Yes Susy Frizzle, MD  MICARDIS 80 MG tablet TAKE ONE TABLET BY MOUTH ONCE DAILY 09/12/14  Yes Susy Frizzle, MD  albuterol (PROVENTIL HFA;VENTOLIN HFA) 108 (90 BASE) MCG/ACT inhaler Inhale 2 puffs into the lungs every 4 (four) hours as needed for wheezing or shortness of breath. 01/31/15   Janne Napoleon, NP  azithromycin (ZITHROMAX) 250 MG tablet Day 1: Take 2 daily.  Days 2-5: Take 1 daily. 06/19/14  Orlena Sheldon, PA-C  Cinnamon 500 MG capsule Take 1,000 mg by mouth daily.    Historical Provider, MD  clobetasol cream (TEMOVATE) [id number]-123456789 % Apply 1 application topically 2 (two) times daily as needed.    Historical Provider, MD  fluticasone (FLONASE) 50 MCG/ACT nasal spray Place 2 sprays into both nostrils daily. 05/14/14   Gregor Hams, MD  guaiFENesin-codeine 100-10 MG/5ML syrup Take 5 mLs by mouth at bedtime as needed for cough. 05/14/14   Gregor Hams, MD  ibuprofen (ADVIL,MOTRIN) 200 MG tablet Take 600 mg by mouth every 6 (six) hours as needed.    Historical Provider, MD  ipratropium (ATROVENT) 0.06 % nasal spray Place 2 sprays into both nostrils 4 (four) times  daily. 01/31/15   Janne Napoleon, NP  NON FORMULARY Herbal Life    Historical Provider, MD  Pyridoxine HCl (VITAMIN B-6) 250 MG tablet Take 250 mg by mouth daily.      Historical Provider, MD  vitamin C (ASCORBIC ACID) 500 MG tablet Take 1,000 mg by mouth daily.    Historical Provider, MD   Meds Ordered and Administered this Visit  Medications - No data to display  BP 130/79 mmHg  Pulse 92  Temp(Src) 98.3 F (36.8 C) (Oral)  Resp 22  SpO2 96% No data found.   Physical Exam  Constitutional: She is oriented to person, place, and time. She appears well-developed and well-nourished. No distress.  HENT:  Bilateral TMs are mildly retracted. No erythema or bulging. Oropharynx with minor erythema. No exudates.  Eyes: EOM are normal.  Neck: Normal range of motion. Neck supple.  Cardiovascular: Normal rate, regular rhythm and normal heart sounds.   Pulmonary/Chest: Effort normal and breath sounds normal. No respiratory distress.  Deep respirations at rest are clear. Cough produces diffuse coarseness.  Musculoskeletal: Normal range of motion. She exhibits no edema.  Lymphadenopathy:    She has no cervical adenopathy.  Neurological: She is alert and oriented to person, place, and time.  Skin: Skin is warm and dry. No rash noted.  Psychiatric: She has a normal mood and affect.  Nursing note and vitals reviewed.   ED Course  Procedures (including critical care time)  Labs Review Labs Reviewed - No data to display  Imaging Review No results found.   Visual Acuity Review  Right Eye Distance:   Left Eye Distance:   Bilateral Distance:    Right Eye Near:   Left Eye Near:    Bilateral Near:         MDM   1. URI (upper respiratory infection)   2. Sinus drainage   3. Cough   4. Bronchospasm    Upper Respiratory Infection, Adult For drainage may take Allegra, Claritin or Zyrtec. At nighttime, if needed may take Chlor-Trimeton 2 mg. Atrovent nasal spray for runny nose and  congestion Albuterol HFA 2 puffs every 4 hours as needed for cough and wheeze Tylenol every 4 hours as needed for discomfort. Drink plenty fluids and stay well-hydrated Saline nasal spray used frequently. For worsening such as fevers, shortness of breath, increased cough seek medical attention promptly.    Janne Napoleon, NP 01/31/15 [phone]

## 2015-01-31 NOTE — Discharge Instructions (Signed)
Upper Respiratory Infection, Adult For drainage may take Allegra, Claritin or Zyrtec. At nighttime, if needed may take Chlor-Trimeton 2 mg. Atrovent nasal spray for runny nose and congestion Albuterol HFA 2 puffs every 4 hours as needed for cough and wheeze Tylenol every 4 hours as needed for discomfort. Drink plenty fluids and stay well-hydrated Saline nasal spray used frequently. For worsening such as fevers, shortness of breath, increased cough seek medical attention promptly. Most upper respiratory infections (URIs) are a viral infection of the air passages leading to the lungs. A URI affects the nose, throat, and upper air passages. The most common type of URI is nasopharyngitis and is typically referred to as "the common cold." URIs run their course and usually go away on their own. Most of the time, a URI does not require medical attention, but sometimes a bacterial infection in the upper airways can follow a viral infection. This is called a secondary infection. Sinus and middle ear infections are common types of secondary upper respiratory infections. Bacterial pneumonia can also complicate a URI. A URI can worsen asthma and chronic obstructive pulmonary disease (COPD). Sometimes, these complications can require emergency medical care and may be life threatening.  CAUSES Almost all URIs are caused by viruses. A virus is a type of germ and can spread from one person to another.  RISKS FACTORS You may be at risk for a URI if:  1. You smoke.  2. You have chronic heart or lung disease. 3. You have a weakened defense (immune) system.  4. You are very young or very old.  5. You have nasal allergies or asthma. 6. You work in crowded or poorly ventilated areas. 7. You work in health care facilities or schools. SIGNS AND SYMPTOMS  Symptoms typically develop 2-3 days after you come in contact with a cold virus. Most viral URIs last 7-10 days. However, viral URIs from the influenza virus (flu  virus) can last 14-18 days and are typically more severe. Symptoms may include:   Runny or stuffy (congested) nose.   Sneezing.   Cough.   Sore throat.   Headache.   Fatigue.   Fever.   Loss of appetite.   Pain in your forehead, behind your eyes, and over your cheekbones (sinus pain).  Muscle aches.  DIAGNOSIS  Your health care provider may diagnose a URI by:  Physical exam.  Tests to check that your symptoms are not due to another condition such as:  Strep throat.  Sinusitis.  Pneumonia.  Asthma. TREATMENT  A URI goes away on its own with time. It cannot be cured with medicines, but medicines may be prescribed or recommended to relieve symptoms. Medicines may help:  Reduce your fever.  Reduce your cough.  Relieve nasal congestion. HOME CARE INSTRUCTIONS   Take medicines only as directed by your health care provider.   Gargle warm saltwater or take cough drops to comfort your throat as directed by your health care provider.  Use a warm mist humidifier or inhale steam from a shower to increase air moisture. This may make it easier to breathe.  Drink enough fluid to keep your urine clear or pale yellow.   Eat soups and other clear broths and maintain good nutrition.   Rest as needed.   Return to work when your temperature has returned to normal or as your health care provider advises. You may need to stay home longer to avoid infecting others. You can also use a face mask and careful hand  washing to prevent spread of the virus.  Increase the usage of your inhaler if you have asthma.   Do not use any tobacco products, including cigarettes, chewing tobacco, or electronic cigarettes. If you need help quitting, ask your health care provider. PREVENTION  The best way to protect yourself from getting a cold is to practice good hygiene.   Avoid oral or hand contact with people with cold symptoms.   Wash your hands often if contact occurs.   There is no clear evidence that vitamin C, vitamin E, echinacea, or exercise reduces the chance of developing a cold. However, it is always recommended to get plenty of rest, exercise, and practice good nutrition.  SEEK MEDICAL CARE IF:   You are getting worse rather than better.   Your symptoms are not controlled by medicine.   You have chills.  You have worsening shortness of breath.  You have brown or red mucus.  You have yellow or brown nasal discharge.  You have pain in your face, especially when you bend forward.  You have a fever.  You have swollen neck glands.  You have pain while swallowing.  You have white areas in the back of your throat. SEEK IMMEDIATE MEDICAL CARE IF:   You have severe or persistent:  Headache.  Ear pain.  Sinus pain.  Chest pain.  You have chronic lung disease and any of the following:  Wheezing.  Prolonged cough.  Coughing up blood.  A change in your usual mucus.  You have a stiff neck.  You have changes in your:  Vision.  Hearing.  Thinking.  Mood. MAKE SURE YOU:   Understand these instructions.  Will watch your condition.  Will get help right away if you are not doing well or get worse.   This information is not intended to replace advice given to you by your health care provider. Make sure you discuss any questions you have with your health care provider.   Document Released: 07/27/2000 Document Revised: 06/17/2014 Document Reviewed: 05/08/2013 Elsevier Interactive Patient Education 2016 Elsevier Inc.  Bronchospasm, Adult A bronchospasm is when the tubes that carry air in and out of your lungs (airways) spasm or tighten. During a bronchospasm it is hard to breathe. This is because the airways get smaller. A bronchospasm can be triggered by: 8. Allergies. These may be to animals, pollen, food, or mold. 9. Infection. This is a common cause of bronchospasm. 10. Exercise. 11. Irritants. These include  pollution, cigarette smoke, strong odors, aerosol sprays, and paint fumes. 12. Weather changes. 13. Stress. 37. Being emotional. HOME CARE   Always have a plan for getting help. Know when to call your doctor and local emergency services (911 in the [place] Know where you can get emergency care.  Only take medicines as told by your doctor.  If you were prescribed an inhaler or nebulizer machine, ask your doctor how to use it correctly. Always use a spacer with your inhaler if you were given one.  Stay calm during an attack. Try to relax and breathe more slowly.  Control your home environment:  Change your heating and air conditioning filter at least once a month.  Limit your use of fireplaces and wood stoves.  Do not  smoke. Do not  allow smoking in your home.  Avoid perfumes and fragrances.  Get rid of pests (such as roaches and mice) and their droppings.  Throw away plants if you see mold on them.  Keep your house clean  and dust free.  Replace carpet with wood, tile, or vinyl flooring. Carpet can trap dander and dust.  Use allergy-proof pillows, mattress covers, and box spring covers.  Wash bed sheets and blankets every week in hot water. Dry them in a dryer.  Use blankets that are made of polyester or cotton.  Wash hands frequently. GET HELP IF:  You have muscle aches.  You have chest pain.  The thick spit you spit or cough up (sputum) changes from clear or white to yellow, green, gray, or bloody.  The thick spit you spit or cough up gets thicker.  There are problems that may be related to the medicine you are given such as:  A rash.  Itching.  Swelling.  Trouble breathing. GET HELP RIGHT AWAY IF:  You feel you cannot breathe or catch your breath.  You cannot stop coughing.  Your treatment is not helping you breathe better.  You have very bad chest pain. MAKE SURE YOU:   Understand these instructions.  Will watch your condition.  Will get help  right away if you are not doing well or get worse.   This information is not intended to replace advice given to you by your health care provider. Make sure you discuss any questions you have with your health care provider.   Document Released: 11/28/2008 Document Revised: 02/21/2014 Document Reviewed: 07/24/2012 Elsevier Interactive Patient Education 2016 Elsevier Inc.  Cough, Adult Coughing is a reflex that clears your throat and your airways. Coughing helps to heal and protect your lungs. It is normal to cough occasionally, but a cough that happens with other symptoms or lasts a long time may be a sign of a condition that needs treatment. A cough may last only 2-3 weeks (acute), or it may last longer than 8 weeks (chronic). CAUSES Coughing is commonly caused by: 15. Breathing in substances that irritate your lungs. 16. A viral or bacterial respiratory infection. 17. Allergies. 18. Asthma. 19. Postnasal drip. 20. Smoking. 21. Acid backing up from the stomach into the esophagus (gastroesophageal reflux). 22. Certain medicines. 23. Chronic lung problems, including COPD (or rarely, lung cancer). 24. Other medical conditions such as heart failure. HOME CARE INSTRUCTIONS  Pay attention to any changes in your symptoms. Take these actions to help with your discomfort:  Take medicines only as told by your health care provider.  If you were prescribed an antibiotic medicine, take it as told by your health care provider. Do not stop taking the antibiotic even if you start to feel better.  Talk with your health care provider before you take a cough suppressant medicine.  Drink enough fluid to keep your urine clear or pale yellow.  If the air is dry, use a cold steam vaporizer or humidifier in your bedroom or your home to help loosen secretions.  Avoid anything that causes you to cough at work or at home.  If your cough is worse at night, try sleeping in a semi-upright position.  Avoid  cigarette smoke. If you smoke, quit smoking. If you need help quitting, ask your health care provider.  Avoid caffeine.  Avoid alcohol.  Rest as needed. SEEK MEDICAL CARE IF:   You have new symptoms.  You cough up pus.  Your cough does not get better after 2-3 weeks, or your cough gets worse.  You cannot control your cough with suppressant medicines and you are losing sleep.  You develop pain that is getting worse or pain that is not controlled with  pain medicines.  You have a fever.  You have unexplained weight loss.  You have night sweats. SEEK IMMEDIATE MEDICAL CARE IF:  You cough up blood.  You have difficulty breathing.  Your heartbeat is very fast.   This information is not intended to replace advice given to you by your health care provider. Make sure you discuss any questions you have with your health care provider.   Document Released: 07/30/2010 Document Revised: 10/22/2014 Document Reviewed: 04/09/2014 Elsevier Interactive Patient Education 2016 [employer].  How to Use an Inhaler Using your inhaler correctly is very important. Good technique will make sure that the medicine reaches your lungs.  HOW TO USE AN INHALER: 25. Take the cap off the inhaler. 26. If this is the first time using your inhaler, you need to prime it. Shake the inhaler for 5 seconds. Release four puffs into the air, away from your face. Ask your doctor for help if you have questions. 27. Shake the inhaler for 5 seconds. 28. Turn the inhaler so the bottle is above the mouthpiece. 29. Put your pointer finger on top of the bottle. Your thumb holds the bottom of the inhaler. 30. Open your mouth. 31. Either hold the inhaler away from your mouth (the width of 2 fingers) or place your lips tightly around the mouthpiece. Ask your doctor which way to use your inhaler. 32. Breathe out as much air as possible. 33. Breathe in and push down on the bottle 1 time to release the medicine. You will  feel the medicine go in your mouth and throat. 34. Continue to take a deep breath in very slowly. Try to fill your lungs. 35. After you have breathed in completely, hold your breath for 10 seconds. This will help the medicine to settle in your lungs. If you cannot hold your breath for 10 seconds, hold it for as long as you can before you breathe out. 36. Breathe out slowly, through pursed lips. Whistling is an example of pursed lips. 50. If your doctor has told you to take more than 1 puff, wait at least 15-30 seconds between puffs. This will help you get the best results from your medicine. Do not use the inhaler more than your doctor tells you to. 38. Put the cap back on the inhaler. 39. Follow the directions from your doctor or from the inhaler package about cleaning the inhaler. If you use more than one inhaler, ask your doctor which inhalers to use and what order to use them in. Ask your doctor to help you figure out when you will need to refill your inhaler.  If you use a steroid inhaler, always rinse your mouth with water after your last puff, gargle and spit out the water. Do not swallow the water. GET HELP IF:  The inhaler medicine only partially helps to stop wheezing or shortness of breath.  You are having trouble using your inhaler.  You have some increase in thick spit (phlegm). GET HELP RIGHT AWAY IF:  The inhaler medicine does not help your wheezing or shortness of breath or you have tightness in your chest.  You have dizziness, headaches, or fast heart rate.  You have chills, fever, or night sweats.  You have a large increase of thick spit, or your thick spit is bloody. MAKE SURE YOU:   Understand these instructions.  Will watch your condition.  Will get help right away if you are not doing well or get worse.   This information is  not intended to replace advice given to you by your health care provider. Make sure you discuss any questions you have with your health  care provider.   Document Released: 11/10/2007 Document Revised: 11/21/2012 Document Reviewed: 08/30/2012 Elsevier Interactive Patient Education [employer].

## 2015-02-12 ENCOUNTER — Telehealth: Payer: Self-pay | Admitting: Family Medicine

## 2015-02-12 NOTE — Telephone Encounter (Addendum)
Patient called to ask if we can go ahead and put in an order for her to have a mamogram. She is off work on 1/3 and 1/4 and is hoping to have it done during her time off. Please call [phone]

## 2015-02-17 ENCOUNTER — Other Ambulatory Visit: Payer: Managed Care, Other (non HMO)

## 2015-02-17 ENCOUNTER — Telehealth: Payer: Self-pay | Admitting: Family Medicine

## 2015-02-17 ENCOUNTER — Encounter: Payer: Self-pay | Admitting: Family Medicine

## 2015-02-17 ENCOUNTER — Encounter: Payer: Managed Care, Other (non HMO) | Admitting: Family Medicine

## 2015-02-17 DIAGNOSIS — Z79899 Other long term (current) drug therapy: Secondary | ICD-10-CM

## 2015-02-17 DIAGNOSIS — I10 Essential (primary) hypertension: Secondary | ICD-10-CM

## 2015-02-17 DIAGNOSIS — F419 Anxiety disorder, unspecified: Secondary | ICD-10-CM

## 2015-02-17 DIAGNOSIS — E785 Hyperlipidemia, unspecified: Secondary | ICD-10-CM

## 2015-02-17 DIAGNOSIS — Z0000 Encounter for general adult medical examination without abnormal findings: Secondary | ICD-10-CM

## 2015-02-17 LAB — CBC WITH DIFFERENTIAL/PLATELET
BASOS PCT: 0 % (ref 0–1)
Basophils Absolute: 0 10*3/uL (ref 0.0–0.1)
Eosinophils Absolute: 0.2 10*3/uL (ref 0.0–0.7)
Eosinophils Relative: 2 % (ref 0–5)
HEMATOCRIT: 40.8 % (ref 36.0–46.0)
Hemoglobin: 13.6 g/dL (ref 12.0–15.0)
Lymphocytes Relative: 36 % (ref 12–46)
Lymphs Abs: 3 10*3/uL (ref 0.7–4.0)
MCH: 30.4 pg (ref 26.0–34.0)
MCHC: 33.3 g/dL (ref 30.0–36.0)
MCV: 91.3 fL (ref 78.0–100.0)
MONO ABS: 0.6 10*3/uL (ref 0.1–1.0)
MONOS PCT: 7 % (ref 3–12)
MPV: 10.1 fL (ref 8.6–12.4)
NEUTROS ABS: 4.5 10*3/uL (ref 1.7–7.7)
Neutrophils Relative %: 55 % (ref 43–77)
Platelets: 344 10*3/uL (ref 150–400)
RBC: 4.47 MIL/uL (ref 3.87–5.11)
RDW: 14.2 % (ref 11.5–15.5)
WBC: 8.2 10*3/uL (ref 4.0–10.5)

## 2015-02-17 LAB — COMPLETE METABOLIC PANEL WITH GFR
ALT: 19 U/L (ref 6–29)
AST: 14 U/L (ref 10–35)
Albumin: 4 g/dL (ref 3.6–5.1)
Alkaline Phosphatase: 53 U/L (ref 33–130)
BUN: 17 mg/dL (ref 7–25)
CHLORIDE: 107 mmol/L (ref 98–110)
CO2: 24 mmol/L (ref 20–31)
Calcium: 9.3 mg/dL (ref 8.6–10.4)
Creat: 0.68 mg/dL (ref 0.50–0.99)
GFR, Est African American: >89 mL/min (ref >=60)
GLUCOSE: 90 mg/dL (ref 70–99)
POTASSIUM: 4.4 mmol/L (ref 3.5–5.3)
SODIUM: 143 mmol/L (ref 135–146)
Total Bilirubin: 0.5 mg/dL (ref 0.2–1.2)
Total Protein: 6.5 g/dL (ref 6.1–8.1)

## 2015-02-17 LAB — LIPID PANEL
CHOL/HDL RATIO: 3.2 ratio (ref <=5.0)
Cholesterol: 202 mg/dL — ABNORMAL HIGH (ref 125–200)
HDL: 64 mg/dL (ref >=46)
LDL CALC: 120 mg/dL (ref <130)
Triglycerides: 88 mg/dL (ref <150)
VLDL: 18 mg/dL (ref <30)

## 2015-02-17 LAB — HM MAMMOGRAPHY

## 2015-02-17 LAB — TSH: TSH: 1.368 u[IU]/mL (ref 0.350–4.500)

## 2015-02-17 MED ORDER — MICARDIS 80 MG PO TABS: 80.0000 mg | ORAL_TABLET | Freq: Every day | ORAL | Status: DC

## 2015-02-17 MED ORDER — FLUVASTATIN SODIUM ER 80 MG PO TB24: 80.0000 mg | ORAL_TABLET | Freq: Every day | ORAL | Status: DC

## 2015-02-17 NOTE — Telephone Encounter (Signed)
Patient came in for her appointment this morning however she was late and wasn't seen. She needs all her medications refilled she was very upset and couldn't think of each medication. She uses [facility].

## 2015-02-17 NOTE — Telephone Encounter (Signed)
Refilled prescribed medications as I do not know what she actually needs. If pt calls and needs inhaler and nasal spray will send in rx.

## 2015-03-03 ENCOUNTER — Encounter: Payer: Managed Care, Other (non HMO) | Admitting: Family Medicine

## 2015-03-04 ENCOUNTER — Encounter: Payer: Self-pay | Admitting: *Deleted

## 2015-03-12 ENCOUNTER — Other Ambulatory Visit: Payer: Self-pay | Admitting: Family Medicine

## 2015-03-12 ENCOUNTER — Ambulatory Visit (INDEPENDENT_AMBULATORY_CARE_PROVIDER_SITE_OTHER): Payer: Managed Care, Other (non HMO) | Admitting: Family Medicine

## 2015-03-12 ENCOUNTER — Encounter: Payer: Self-pay | Admitting: Family Medicine

## 2015-03-12 VITALS — BP 144/80 | HR 92 | Temp 97.8°F | Resp 20 | Ht 64.5 in | Wt 252.0 lb

## 2015-03-12 DIAGNOSIS — Z23 Encounter for immunization: Secondary | ICD-10-CM

## 2015-03-12 DIAGNOSIS — Z0000 Encounter for general adult medical examination without abnormal findings: Secondary | ICD-10-CM | POA: Diagnosis not present

## 2015-03-12 DIAGNOSIS — Z7251 High risk heterosexual behavior: Secondary | ICD-10-CM | POA: Diagnosis not present

## 2015-03-12 MED ORDER — FLUVASTATIN SODIUM ER 80 MG PO TB24: 80.0000 mg | ORAL_TABLET | Freq: Every day | ORAL | Status: DC

## 2015-03-12 MED ORDER — MICARDIS 80 MG PO TABS: 80.0000 mg | ORAL_TABLET | Freq: Every day | ORAL | Status: DC

## 2015-03-12 MED ORDER — IPRATROPIUM BROMIDE 0.06 % NA SOLN: 2.0000 | Freq: Four times a day (QID) | NASAL | Status: DC

## 2015-03-12 MED ORDER — ALBUTEROL SULFATE HFA 108 (90 BASE) MCG/ACT IN AERS
2.0000 | INHALATION_SPRAY | RESPIRATORY_TRACT | Status: DC | PRN
Start: 2015-03-12 — End: 2015-05-20

## 2015-03-12 MED ORDER — ZOSTER VACCINE LIVE 19400 UNT/0.65ML ~~LOC~~ SOLR: 0.6500 mL | Freq: Once | SUBCUTANEOUS | Status: DC

## 2015-03-12 MED ORDER — FLUTICASONE PROPIONATE 50 MCG/ACT NA SUSP: 2.0000 | Freq: Every day | NASAL | Status: DC

## 2015-03-12 NOTE — Progress Notes (Signed)
Subjective:    Patient ID: [name], female    DOB: 02/06/1956, 60 y.o.   MRN: [id number]  HPI   Patient is a very pleasant 60 year old African-American female who is here today for physical exam. Her last colonoscopy was in 2006. She is due again. She does have a history of breast cancer status post surgical resection in her left breast.  Patient recently had a mammogram which is normal. She is due for a Pap smear today. She is also  Requesting screening for sexually transmitted diseases including HIV, syphilis, gonorrhea, Chlamydia.  Past medical history includes hypertension, hyperlipidemia, obstructive sleep apnea, and mildly suppressed ejection fraction of 45% due to left bundle branch block and hypertensive cardiomyopathy. Past Medical History  Diagnosis Date  . Hypertension   . Hyperlipidemia   . History of breast cancer   . Cataract   . Anxiety   . Neuropathy ([place])   . Carpal tunnel syndrome   . Low back pain   . Myalgia and myositis   . Cancer ([place])     left breast  . OSA on CPAP   . Left bundle branch block (LBBB)   . Left ventricular dysfunction     ef 45%   Past Surgical History  Procedure Laterality Date  . Cataract extraction w/ intraocular lens  implant, bilateral    . Lumpectomy left breast    . Dilation and curettage of uterus    . Eye surgery     Current Outpatient Prescriptions on File Prior to Visit  Medication Sig Dispense Refill  . Cinnamon 500 MG capsule Take 1,000 mg by mouth daily.    . clobetasol cream (TEMOVATE) 8.91 % Apply 1 application topically 2 (two) times daily as needed.    Marland Kitchen ibuprofen (ADVIL,MOTRIN) 200 MG tablet Take 600 mg by mouth every 6 (six) hours as needed.    . vitamin C (ASCORBIC ACID) 500 MG tablet Take 1,000 mg by mouth daily.     No current facility-administered medications on file prior to visit.   Allergies  Allergen Reactions  . Atorvastatin     REACTION: mylagias  . Lipitor [Atorvastatin Calcium] Other (See  Comments)    myalgia  . Pravachol Other (See Comments)    myalgia  . Pravastatin     REACTION: myalgias  . Pravastatin Sodium     REACTION: myalgias  . Sulfa Antibiotics Hives  . Sulfonamide Derivatives     REACTION: hives   Social History   Social History  . Marital Status: Significant Other    Spouse Name: N/A  . Number of Children: N/A  . Years of Education: N/A   Occupational History  . Truck Geophysicist/field seismologist    Social History Main Topics  . Smoking status: Former Smoker    Quit date: 06/18/2009  . Smokeless tobacco: Never Used  . Alcohol Use: 0.0 oz/week     Comment: occasional use  . Drug Use: No  . Sexual Activity: Yes   Other Topics Concern  . Not on file   Social History Narrative   Family History  Problem Relation Age of Onset  . Arthritis Mother   . Depression Mother   . Hearing loss Mother   . Hyperlipidemia Mother   . Miscarriages / [place] Mother   . Alcohol abuse Father   . Cancer Father   . Depression Father   . Hypertension Father   . Drug abuse Brother   . Early death Maternal Grandmother   .  Cancer Paternal Grandmother      Review of Systems  All other systems reviewed and are negative.      Objective:   Physical Exam  Constitutional: She is oriented to person, place, and time. She appears well-developed and well-nourished. No distress.  HENT:  Head: Normocephalic and atraumatic.  Right Ear: External ear normal.  Left Ear: External ear normal.  Nose: Nose normal.  Mouth/Throat: Oropharynx is clear and moist. No oropharyngeal exudate.  Eyes: Conjunctivae and EOM are normal. Pupils are equal, round, and reactive to light. Right eye exhibits no discharge. Left eye exhibits no discharge. No scleral icterus.  Neck: Normal range of motion. Neck supple. No JVD present. No tracheal deviation present. No thyromegaly present.  Cardiovascular: Normal rate, regular rhythm, normal heart sounds and intact distal pulses.  Exam reveals no gallop and  no friction rub.   No murmur heard. Pulmonary/Chest: Effort normal and breath sounds normal. No stridor. No respiratory distress. She has no wheezes. She has no rales. She exhibits no tenderness.  Abdominal: Soft. Bowel sounds are normal. She exhibits no distension and no mass. There is no tenderness. There is no rebound and no guarding.  Genitourinary: Vagina normal and uterus normal. No vaginal discharge found.  Musculoskeletal: Normal range of motion. She exhibits no edema or tenderness.  Lymphadenopathy:    She has no cervical adenopathy.  Neurological: She is alert and oriented to person, place, and time. She has normal reflexes. No cranial nerve deficit. She exhibits normal muscle tone. Coordination normal.  Skin: Skin is warm. No rash noted. She is not diaphoretic. No erythema. No pallor.  Psychiatric: She has a normal mood and affect. Her behavior is normal. Judgment and thought content normal.  Vitals reviewed.         Assessment & Plan:  High risk sexual behavior - Plan: RPR, HIV antibody, Hepatitis C Ab Reflex HCV RNA, QUANT, GC/Chlamydia Probe Amp, CANCELED: GC/Chlamydia Probe Amp  Routine general medical examination at a health care facility - Plan: PAP, Thin Prep w/HPV rflx HPV Type 16/18, Ambulatory referral to Gastroenterology  Need for prophylactic vaccination and inoculation against influenza - Plan: Flu Vaccine QUAD 36+ mos IM  Patient's physical exam is significant for obesity.  I continue to recommend lifestyle changes to address including diet exercise and weight loss. I will schedule the patient for colonoscopy. She received her flu shot. In accordance with her wishes, I will check her for HIV, hepatitis C, gonorrhea, and chlamydia. I sent her Pap smear to pathology. I reviewed her lab work which is listed below which were within normal limits: Abstract on 03/04/2015  Component Date Value Ref Range Status  . HM Mammogram 02/17/2015 NO MAMMOGRAPHIC EVIDENCE OF  MALIGNANCY    Final  Lab on 02/17/2015  Component Date Value Ref Range Status  . Sodium 02/17/2015 143  135 - 146 mmol/L Final  . Potassium 02/17/2015 4.4  3.5 - 5.3 mmol/L Final  . Chloride 02/17/2015 107  98 - 110 mmol/L Final  . CO2 02/17/2015 24  20 - 31 mmol/L Final  . Glucose, Bld 02/17/2015 90  70 - 99 mg/dL Final  . BUN 02/17/2015 17  7 - 25 mg/dL Final  . Creat 02/17/2015 0.68  0.50 - 0.99 mg/dL Final  . Total Bilirubin 02/17/2015 0.5  0.2 - 1.2 mg/dL Final  . Alkaline Phosphatase 02/17/2015 53  33 - 130 U/L Final  . AST 02/17/2015 14  10 - 35 U/L Final  . ALT 02/17/2015  19  6 - 29 U/L Final  . Total Protein 02/17/2015 6.5  6.1 - 8.1 g/dL Final  . Albumin 02/17/2015 4.0  3.6 - 5.1 g/dL Final  . Calcium 02/17/2015 9.3  8.6 - 10.4 mg/dL Final  . GFR, Est African American 02/17/2015 >89  >=60 mL/min Final  . GFR, Est Non African American 02/17/2015 >89  >=60 mL/min Final   Comment:   The estimated GFR is a calculation valid for adults (>=80 years old) that uses the CKD-EPI algorithm to adjust for age and sex. It is   not to be used for children, pregnant women, hospitalized patients,    patients on dialysis, or with rapidly changing kidney function. According to the NKDEP, eGFR >89 is normal, 60-89 shows mild impairment, 30-59 shows moderate impairment, 15-29 shows severe impairment and <15 is ESRD.     . TSH 02/17/2015 1.368  0.350 - 4.500 uIU/mL Final  . Cholesterol 02/17/2015 202* 125 - 200 mg/dL Final  . Triglycerides 02/17/2015 88  <150 mg/dL Final  . HDL 02/17/2015 64  >=46 mg/dL Final  . Total CHOL/HDL Ratio 02/17/2015 3.2  <=5.0 Ratio Final  . VLDL 02/17/2015 18  <30 mg/dL Final  . LDL Cholesterol 02/17/2015 120  <130 mg/dL Final   Comment:   Total Cholesterol/HDL Ratio:CHD Risk                        Coronary Heart Disease Risk Table                                        Men       Women          1/2 Average Risk              3.4        3.3              Average  Risk              5.0        4.4           2X Average Risk              9.6        7.1           3X Average Risk             23.4       11.0 Use the calculated Patient Ratio above and the CHD Risk table  to determine the patient's CHD Risk.   . WBC 02/17/2015 8.2  4.0 - 10.5 K/uL Final  . RBC 02/17/2015 4.47  3.87 - 5.11 MIL/uL Final  . Hemoglobin 02/17/2015 13.6  12.0 - 15.0 g/dL Final  . HCT 02/17/2015 40.8  36.0 - 46.0 % Final  . MCV 02/17/2015 91.3  78.0 - 100.0 fL Final  . MCH 02/17/2015 30.4  26.0 - 34.0 pg Final  . MCHC 02/17/2015 33.3  30.0 - 36.0 g/dL Final  . RDW 02/17/2015 14.2  11.5 - 15.5 % Final  . Platelets 02/17/2015 344  150 - 400 K/uL Final  . MPV 02/17/2015 10.1  8.6 - 12.4 fL Final  . Neutrophils Relative % 02/17/2015 55  43 - 77 % Final  . Neutro Abs 02/17/2015 4.5  1.7 - 7.7 K/uL Final  . Lymphocytes Relative 02/17/2015 36  12 - 46 % Final  . Lymphs Abs 02/17/2015 3.0  0.7 - 4.0 K/uL Final  . Monocytes Relative 02/17/2015 7  3 - 12 % Final  . Monocytes Absolute 02/17/2015 0.6  0.1 - 1.0 K/uL Final  . Eosinophils Relative 02/17/2015 2  0 - 5 % Final  . Eosinophils Absolute 02/17/2015 0.2  0.0 - 0.7 K/uL Final  . Basophils Relative 02/17/2015 0  0 - 1 % Final  . Basophils Absolute 02/17/2015 0.0  0.0 - 0.1 K/uL Final  . Smear Review 02/17/2015 Criteria for review not met   Final

## 2015-03-13 LAB — HEPATITIS C ANTIBODY: HCV AB: NEGATIVE

## 2015-03-13 LAB — HIV ANTIBODY (ROUTINE TESTING W REFLEX): HIV: NONREACTIVE

## 2015-03-13 LAB — GC/CHLAMYDIA PROBE AMP
CT Probe RNA: NOT DETECTED
GC PROBE AMP APTIMA: NOT DETECTED

## 2015-03-13 LAB — RPR

## 2015-03-16 LAB — PAP, THIN PREP W/HPV RFLX HPV TYPE 16/18: HPV DNA HIGH RISK: NOT DETECTED

## 2015-03-23 ENCOUNTER — Telehealth: Payer: Self-pay | Admitting: Family Medicine

## 2015-03-23 NOTE — Telephone Encounter (Signed)
PA submitted.   Dx: [id number].

## 2015-03-23 NOTE — Telephone Encounter (Signed)
lescol  Patient is calling to say that pharmacy told her that this med needed authorization   [phone] (H)

## 2015-03-23 NOTE — Telephone Encounter (Signed)
Received PA determination.   PA approved as long as member remains on policy.   Request ID: [id number].  Pharmacy made aware.

## 2015-03-31 ENCOUNTER — Telehealth: Payer: Self-pay | Admitting: Family Medicine

## 2015-03-31 NOTE — Telephone Encounter (Signed)
Pt is requesting that Dr. Dennard Schaumann switch her Lescol 80 mg to Fluvastatin-ER. [name] will only charge her a $28 co-pay for this medication. [facility] [place]

## 2015-04-10 NOTE — Telephone Encounter (Signed)
ok 

## 2015-04-13 MED ORDER — FLUVASTATIN SODIUM ER 80 MG PO TB24: 80.0000 mg | ORAL_TABLET | Freq: Every day | ORAL | Status: DC

## 2015-04-13 NOTE — Telephone Encounter (Signed)
Generic med sent to pharm and name brand only rx cxd

## 2015-04-15 ENCOUNTER — Encounter: Payer: Self-pay | Admitting: Gastroenterology

## 2015-05-15 ENCOUNTER — Encounter: Payer: Self-pay | Admitting: Family Medicine

## 2015-05-15 ENCOUNTER — Ambulatory Visit (INDEPENDENT_AMBULATORY_CARE_PROVIDER_SITE_OTHER): Payer: Managed Care, Other (non HMO) | Admitting: Family Medicine

## 2015-05-15 VITALS — BP 136/84 | HR 82 | Temp 98.0°F | Resp 20 | Ht 64.5 in | Wt 259.0 lb

## 2015-05-15 DIAGNOSIS — R0609 Other forms of dyspnea: Secondary | ICD-10-CM | POA: Diagnosis not present

## 2015-05-15 MED ORDER — CLOBETASOL PROPIONATE 0.05 % EX CREA: 1.0000 "application " | TOPICAL_CREAM | Freq: Two times a day (BID) | CUTANEOUS | Status: DC | PRN

## 2015-05-15 MED ORDER — METOPROLOL SUCCINATE ER 25 MG PO TB24: 25.0000 mg | ORAL_TABLET | Freq: Every day | ORAL | Status: DC

## 2015-05-15 NOTE — Progress Notes (Signed)
Subjective:    Patient ID: [name], female    DOB: 02/14/1956, 60 y.o.   MRN: [id number]  HPI Patient has known left ventricular dysfunction with an ejection fraction of 45%. On a stress test performed in 2014 she was found to have mild to moderate reversible perfusion defect in the basal inferior, mid inferior, and apical inferior regions.  At the time she was asymptomatic and the decision was made to monitor this medically. However over the last 2 weeks she has developed severe dyspnea on exertion. Patient became winded simply walking into my clinic. She also develops pressure in her chest with exertion. She denies any chest pain. She denies any arm pain. She denies any nausea vomiting. Around the same time she also developed significant bloating in the abdomen, acid reflux, early satiety, and loss of appetite.  Currently on a statin medication but no aspirin and no beta blocker. She is taking an angiotensin receptor blocker. Past Medical History  Diagnosis Date  . Hypertension   . Hyperlipidemia   . Cataract   . Anxiety   . Neuropathy ([place])   . Carpal tunnel syndrome   . Low back pain   . Myalgia and myositis   . Breast cancer ([place])     left breast  . OSA on CPAP   . Left bundle branch block (LBBB)   . Left ventricular dysfunction     ef 45%   Past Surgical History  Procedure Laterality Date  . Cataract extraction w/ intraocular lens  implant, bilateral    . Lumpectomy left breast    . Dilation and curettage of uterus    . Eye surgery     Current Outpatient Prescriptions on File Prior to Visit  Medication Sig Dispense Refill  . albuterol (PROVENTIL HFA;VENTOLIN HFA) 108 (90 Base) MCG/ACT inhaler Inhale 2 puffs into the lungs every 4 (four) hours as needed for wheezing or shortness of breath. 1 Inhaler 0  . Cinnamon 500 MG capsule Take 1,000 mg by mouth daily.    . fluticasone (FLONASE) 50 MCG/ACT nasal spray Place 2 sprays into both nostrils daily. 16 g 11  .  fluvastatin XL (LESCOL XL) 80 MG 24 hr tablet Take 1 tablet (80 mg total) by mouth at bedtime. 30 tablet 11  . ibuprofen (ADVIL,MOTRIN) 200 MG tablet Take 600 mg by mouth every 6 (six) hours as needed.    Marland Kitchen ipratropium (ATROVENT) 0.06 % nasal spray Place 2 sprays into both nostrils 4 (four) times daily. 15 mL 12  . MICARDIS 80 MG tablet Take 1 tablet (80 mg total) by mouth daily. 30 tablet 11  . vitamin C (ASCORBIC ACID) 500 MG tablet Take 1,000 mg by mouth daily.     No current facility-administered medications on file prior to visit.   Allergies  Allergen Reactions  . Atorvastatin     REACTION: mylagias  . Lipitor [Atorvastatin Calcium] Other (See Comments)    myalgia  . Pravachol Other (See Comments)    myalgia  . Pravastatin     REACTION: myalgias  . Pravastatin Sodium     REACTION: myalgias  . Sulfa Antibiotics Hives  . Sulfonamide Derivatives     REACTION: hives   Social History   Social History  . Marital Status: Significant Other    Spouse Name: N/A  . Number of Children: N/A  . Years of Education: N/A   Occupational History  . Truck Geophysicist/field seismologist    Social History Main Topics  .  Smoking status: Former Smoker    Quit date: 06/18/2009  . Smokeless tobacco: Never Used  . Alcohol Use: 0.0 oz/week     Comment: occasional use  . Drug Use: No  . Sexual Activity: Yes   Other Topics Concern  . Not on file   Social History Narrative      Review of Systems  All other systems reviewed and are negative.      Objective:   Physical Exam  Constitutional: She appears well-developed and well-nourished.  Cardiovascular: Normal rate, regular rhythm and normal heart sounds.   Pulmonary/Chest: Effort normal and breath sounds normal. No respiratory distress. She has no wheezes. She has no rales.  Abdominal: Soft. Bowel sounds are normal.  Musculoskeletal: She exhibits no edema.  Vitals reviewed.         Assessment & Plan:  Dyspnea on exertion - Plan: EKG 12-Lead,  metoprolol succinate (TOPROL-XL) 25 MG 24 hr tablet  EKG shows left bundle branch block but there is no significant change from 2015. I am concerned the patient may have cardiac ischemia as a cause for dyspnea on exertion. I recommended we get her reestablished with her cardiologist as soon as possible for a stress test and possibly an echocardiogram. Begin metoprolol XL 25 mg by mouth daily along with aspirin 81 mg by mouth daily. Continue her statin medication along with Micardis until seen by cardiology. If cardiac evaluation is normal, I will turn my focus to a chest x-ray as well as a GI workup. However her dyspnea was exertion is significant and I believe requires evaluation quickly

## 2015-05-16 DIAGNOSIS — I272 Pulmonary hypertension, unspecified: Secondary | ICD-10-CM

## 2015-05-16 HISTORY — DX: Pulmonary hypertension, unspecified: I27.20

## 2015-05-19 NOTE — Progress Notes (Signed)
Cardiology Office Note  Cardiac cath:   Date:  05/20/2015   ID:  [name], DOB 05/20/1955, MRN [id number]  PCP:  Odette Fraction, MD  Cardiologist:  Dr. Radford Pax    Chief Complaint  Patient presents with  . Chest Pain      History of Present Illness: [name] is a 60 y.o. female who presents at request of Dr. Dennard Schaumann for chest pressure.   She has a history of HTN and LBBB who last saw Dr. Radford Pax 12/2013 for abnormal stress test done for LBBB. She was completely asymptomatic and stress test showed a moderate defect in the inferior wall with very mild reversibility that was felt to be diaphragmatic and breast attenuation artifact. She was continued on medical therapy since she was asymptomatic. Of note her EF on nuclear stress test was 45%. 2D echo confirmed EF 45%.  Also sleep apnea with Cpap.  Today she tells me she has had DOE, and now chest pressure with exertion.  Dr. Dennard Schaumann added BB.  This began 3 weeks ago and has progressed.  Now occ with rest develops chest pressure and the SOB.  Has to stop with walking.  EKG without changes in PCP office. On Friday.  Past Medical History  Diagnosis Date  . Hypertension   . Hyperlipidemia   . Cataract   . Anxiety   . Neuropathy ([place])   . Carpal tunnel syndrome   . Low back pain   . Myalgia and myositis   . Breast cancer ([place])     left breast  . OSA on CPAP   . Left bundle branch block (LBBB)   . Left ventricular dysfunction     ef 45%    Past Surgical History  Procedure Laterality Date  . Cataract extraction w/ intraocular lens  implant, bilateral    . Lumpectomy left breast    . Dilation and curettage of uterus    . Eye surgery       Current Outpatient Prescriptions  Medication Sig Dispense Refill  . aspirin 81 MG tablet Take 81 mg by mouth daily.    . clobetasol cream (TEMOVATE) [id number] % Apply 1 application topically 2 (two) times daily as needed. (Patient taking differently: Apply 1 application  topically 2 (two) times daily as needed (SKIN RASH). ) 30 g 1  . fluticasone (FLONASE) 50 MCG/ACT nasal spray Place 2 sprays into both nostrils daily. (Patient taking differently: Place 2 sprays into both nostrils as needed (ALLERGIES). ) 16 g 11  . fluvastatin XL (LESCOL XL) 80 MG 24 hr tablet Take 1 tablet (80 mg total) by mouth at bedtime. 30 tablet 11  . ibuprofen (ADVIL,MOTRIN) 200 MG tablet Take 600 mg by mouth every 6 (six) hours as needed for moderate pain.     Marland Kitchen ipratropium (ATROVENT) 0.06 % nasal spray Place 2 sprays into both nostrils 4 (four) times daily. 15 mL 12  . metoprolol succinate (TOPROL-XL) 25 MG 24 hr tablet Take 1 tablet (25 mg total) by mouth daily. 90 tablet 3  . MICARDIS 80 MG tablet Take 1 tablet (80 mg total) by mouth daily. 30 tablet 11  . vitamin C (ASCORBIC ACID) 500 MG tablet Take 1,000 mg by mouth daily.     No current facility-administered medications for this visit.    Allergies:   Atorvastatin; Lipitor; Pravachol; Pravastatin; Pravastatin sodium; Sulfa antibiotics; and Sulfonamide derivatives    Social History:  The patient  reports that she quit smoking about 5  years ago. She has never used smokeless tobacco. She reports that she drinks alcohol. She reports that she does not use illicit drugs.   Family History:  The patient's family history includes Alcohol abuse in her father; Arthritis in her mother; Cancer in her father and paternal grandmother; Depression in her father and mother; Drug abuse in her brother; Early death in her maternal grandmother; Hearing loss in her mother; Hyperlipidemia in her father and mother; Hypertension in her father; Miscarriages / [place] in her mother. There is no history of Heart attack.    ROS:  General:no colds or fevers, no weight changes Skin:no rashes or ulcers HEENT:no blurred vision, no congestion CV:see HPI PUL:see HPI GI:no diarrhea constipation or melena, no indigestion GU:no hematuria, no dysuria MS:no  joint pain, no claudication Neuro:no syncope, no lightheadedness Endo:no diabetes, no thyroid disease  Wt Readings from Last 3 Encounters:  05/20/15 260 lb (117.935 kg)  05/15/15 259 lb (117.482 kg)  03/12/15 252 lb (114.306 kg)     PHYSICAL EXAM: VS:  BP 140/90 mmHg  Pulse 73  Ht 5' 4.5" (1.638 m)  Wt 260 lb (117.935 kg)  BMI 43.96 kg/m2 , BMI Body mass index is 43.96 kg/(m^2). General:Pleasant affect, NAD Skin:Warm and dry, brisk capillary refill HEENT:normocephalic, sclera clear, mucus membranes moist Neck:supple, no JVD, no bruits  Heart:S1S2 RRR without murmur, gallup, rub or click Lungs:clear without rales, rhonchi, or wheezes [id number], non tender, + BS, do not palpate liver spleen or masses Ext:no lower ext edema, 2+ pedal pulses, 2+ radial pulses Neuro:alert and oriented, MAE, follows commands, + facial symmetry    EKG:  EKG is NOT ordered today. The ekg from 05/15/15 with LBBB and inverted T waves in II,III, AVF but they were present in 2014.  Recent Labs: 02/17/2015: ALT 19; BUN 17; Creat 0.68; Hemoglobin 13.6; Platelets 344; Potassium 4.4; Sodium 143; TSH 1.368    Lipid Panel    Component Value Date/Time   CHOL 202* 02/17/2015 0858   TRIG 88 02/17/2015 0858   HDL 64 02/17/2015 0858   CHOLHDL 3.2 02/17/2015 0858   VLDL 18 02/17/2015 0858   LDLCALC 120 02/17/2015 0858       Other studies Reviewed: Additional studies/ records that were reviewed today include: Nuc and last echo.as above   ASSESSMENT AND PLAN:  1.  crescendo angina (chest pressure and DOE increased over last 3 weeks.  No chest pain currently. Hx of abnormal nuc in 2014 but no cath at that time pt was asymptomatic.  discussed with Dr. Lovena Le who also saw the pt and we will send today to short stay for labs and xray and plan for cath.  Both Rt and Lt.   The patient understands that risks included but are not limited to stroke (1 in 1000), death (1 in 64), kidney failure [usually temporary]  (1 in 500), bleeding (1 in 200), allergic reaction [possibly serious] (1 in 200).     2. LBBB chronic  3. HTN  4. obesity      Current medicines are reviewed with the patient today.  The patient Has no concerns regarding medicines.  The following changes have been made:  See above Labs/ tests ordered today include:see above  Disposition:   FU:  see above  Lennie Muckle, NP  05/20/2015 9:33 AM    [facility] [address], [place], [place], [place] Phone: [phone]; Fax: [phone]  Cardiology Attending  Patient seen and examined. I have reviewed the findings as noted above by Cecilie Kicks and have made minimal modification in her note. The patient has chest pressure and sob and will be admitted for left and right heart cath. Previously she has had an abnormal stress test. Her exam reveals and anxious middle aged woman, RRR with clear lungs. No edema. Neuro was non-focal A/P 1. Chest pressure with sob in the setting of an abnormal stress test. I have discussed the treatment options with the patient and she will be brought to the hospital for a Left and right heart cath.  Mikle Bosworth.D.

## 2015-05-20 ENCOUNTER — Ambulatory Visit (HOSPITAL_COMMUNITY): Payer: Managed Care, Other (non HMO)

## 2015-05-20 ENCOUNTER — Encounter (HOSPITAL_COMMUNITY)
Admission: AD | Disposition: A | Discharge status: Home / Self Care | Payer: Self-pay | Source: Ambulatory Visit | Attending: Cardiology

## 2015-05-20 ENCOUNTER — Ambulatory Visit (INDEPENDENT_AMBULATORY_CARE_PROVIDER_SITE_OTHER): Payer: Managed Care, Other (non HMO) | Admitting: Internal Medicine

## 2015-05-20 ENCOUNTER — Encounter: Payer: Self-pay | Admitting: Cardiology

## 2015-05-20 ENCOUNTER — Inpatient Hospital Stay (HOSPITAL_COMMUNITY)
Admission: AD | Admit: 2015-05-20 | Discharge: 2015-05-23 | DRG: 287 | Disposition: A | Discharge status: Home / Self Care | Payer: Managed Care, Other (non HMO) | Source: Ambulatory Visit | Attending: Cardiology | Admitting: Cardiology

## 2015-05-20 VITALS — BP 140/90 | HR 73 | Ht 64.5 in | Wt 260.0 lb

## 2015-05-20 DIAGNOSIS — I5043 Acute on chronic combined systolic (congestive) and diastolic (congestive) heart failure: Secondary | ICD-10-CM | POA: Diagnosis present

## 2015-05-20 DIAGNOSIS — I110 Hypertensive heart disease with heart failure: Principal | ICD-10-CM | POA: Diagnosis present

## 2015-05-20 DIAGNOSIS — Z87891 Personal history of nicotine dependence: Secondary | ICD-10-CM | POA: Diagnosis not present

## 2015-05-20 DIAGNOSIS — E7800 Pure hypercholesterolemia, unspecified: Secondary | ICD-10-CM | POA: Diagnosis present

## 2015-05-20 DIAGNOSIS — I509 Heart failure, unspecified: Secondary | ICD-10-CM | POA: Diagnosis not present

## 2015-05-20 DIAGNOSIS — I272 Other secondary pulmonary hypertension: Secondary | ICD-10-CM | POA: Diagnosis present

## 2015-05-20 DIAGNOSIS — I472 Ventricular tachycardia: Secondary | ICD-10-CM | POA: Diagnosis present

## 2015-05-20 DIAGNOSIS — E782 Mixed hyperlipidemia: Secondary | ICD-10-CM | POA: Diagnosis present

## 2015-05-20 DIAGNOSIS — Z882 Allergy status to sulfonamides status: Secondary | ICD-10-CM | POA: Diagnosis not present

## 2015-05-20 DIAGNOSIS — Z888 Allergy status to other drugs, medicaments and biological substances status: Secondary | ICD-10-CM

## 2015-05-20 DIAGNOSIS — I5021 Acute systolic (congestive) heart failure: Secondary | ICD-10-CM | POA: Diagnosis present

## 2015-05-20 DIAGNOSIS — I4729 Other ventricular tachycardia: Secondary | ICD-10-CM

## 2015-05-20 DIAGNOSIS — I200 Unstable angina: Secondary | ICD-10-CM | POA: Diagnosis present

## 2015-05-20 DIAGNOSIS — R0789 Other chest pain: Secondary | ICD-10-CM

## 2015-05-20 DIAGNOSIS — I429 Cardiomyopathy, unspecified: Secondary | ICD-10-CM | POA: Diagnosis present

## 2015-05-20 DIAGNOSIS — R0602 Shortness of breath: Secondary | ICD-10-CM

## 2015-05-20 DIAGNOSIS — I5042 Chronic combined systolic (congestive) and diastolic (congestive) heart failure: Secondary | ICD-10-CM | POA: Diagnosis present

## 2015-05-20 DIAGNOSIS — I447 Left bundle-branch block, unspecified: Secondary | ICD-10-CM | POA: Diagnosis present

## 2015-05-20 DIAGNOSIS — I119 Hypertensive heart disease without heart failure: Secondary | ICD-10-CM

## 2015-05-20 DIAGNOSIS — I5023 Acute on chronic systolic (congestive) heart failure: Secondary | ICD-10-CM | POA: Diagnosis present

## 2015-05-20 DIAGNOSIS — I5032 Chronic diastolic (congestive) heart failure: Secondary | ICD-10-CM | POA: Diagnosis present

## 2015-05-20 DIAGNOSIS — I428 Other cardiomyopathies: Secondary | ICD-10-CM

## 2015-05-20 DIAGNOSIS — G4733 Obstructive sleep apnea (adult) (pediatric): Secondary | ICD-10-CM | POA: Diagnosis present

## 2015-05-20 DIAGNOSIS — I10 Essential (primary) hypertension: Secondary | ICD-10-CM | POA: Diagnosis not present

## 2015-05-20 HISTORY — PX: CARDIAC CATHETERIZATION: SHX172

## 2015-05-20 HISTORY — DX: Other cardiomyopathies: I42.8

## 2015-05-20 LAB — POCT I-STAT 3, VENOUS BLOOD GAS (G3P V)
Acid-base deficit: 3 mmol/L — ABNORMAL HIGH (ref 0.0–2.0)
Bicarbonate: 23.4 mEq/L (ref 20.0–24.0)
O2 Saturation: 64 %
PCO2 VEN: 45 mmHg (ref 45.0–50.0)
PH VEN: 7.325 — AB (ref 7.250–7.300)
TCO2: 25 mmol/L (ref 0–100)
pO2, Ven: 36 mmHg (ref 31.0–45.0)

## 2015-05-20 LAB — CBC
HCT: 39.3 % (ref 36.0–46.0)
Hemoglobin: 12.9 g/dL (ref 12.0–15.0)
MCH: 30.1 pg (ref 26.0–34.0)
MCHC: 32.8 g/dL (ref 30.0–36.0)
MCV: 91.8 fL (ref 78.0–100.0)
PLATELETS: 292 10*3/uL (ref 150–400)
RBC: 4.28 MIL/uL (ref 3.87–5.11)
RDW: 14.2 % (ref 11.5–15.5)
WBC: 9.7 10*3/uL (ref 4.0–10.5)

## 2015-05-20 LAB — POCT I-STAT 3, ART BLOOD GAS (G3+)
Acid-base deficit: 4 mmol/L — ABNORMAL HIGH (ref 0.0–2.0)
BICARBONATE: 20.9 meq/L (ref 20.0–24.0)
O2 Saturation: 96 %
PCO2 ART: 38.5 mmHg (ref 35.0–45.0)
PH ART: 7.343 — AB (ref 7.350–7.450)
PO2 ART: 90 mmHg (ref 80.0–100.0)
TCO2: 22 mmol/L (ref 0–100)

## 2015-05-20 LAB — BASIC METABOLIC PANEL
Anion gap: 11 (ref 5–15)
BUN: 9 mg/dL (ref 6–20)
CALCIUM: 9.3 mg/dL (ref 8.9–10.3)
CO2: 20 mmol/L — ABNORMAL LOW (ref 22–32)
CREATININE: 0.77 mg/dL (ref 0.44–1.00)
Chloride: 110 mmol/L (ref 101–111)
GFR calc Af Amer: >60 mL/min (ref >60)
Glucose, Bld: 97 mg/dL (ref 65–99)
POTASSIUM: 3.8 mmol/L (ref 3.5–5.1)
SODIUM: 141 mmol/L (ref 135–145)

## 2015-05-20 LAB — PROTIME-INR
INR: 1.18 (ref 0.00–1.49)
PROTHROMBIN TIME: 15.2 s (ref 11.6–15.2)

## 2015-05-20 LAB — BRAIN NATRIURETIC PEPTIDE: B NATRIURETIC PEPTIDE 5: 156.7 pg/mL — AB (ref 0.0–100.0)

## 2015-05-20 LAB — MRSA PCR SCREENING: MRSA BY PCR: NEGATIVE

## 2015-05-20 SURGERY — RIGHT/LEFT HEART CATH AND CORONARY ANGIOGRAPHY

## 2015-05-20 MED ORDER — LIDOCAINE HCL (PF) 1 % IJ SOLN
INTRAMUSCULAR | Status: DC | PRN
  Administered 2015-05-20: 20 mL

## 2015-05-20 MED ORDER — SODIUM CHLORIDE 0.9 % WEIGHT BASED INFUSION: 1.0000 mL/kg/h | INTRAVENOUS | Status: DC

## 2015-05-20 MED ORDER — SODIUM CHLORIDE 0.9% FLUSH
3.0000 mL | Freq: Two times a day (BID) | INTRAVENOUS | Status: DC
  Administered 2015-05-20 – 2015-05-23 (×5): 3 mL via INTRAVENOUS

## 2015-05-20 MED ORDER — LIDOCAINE HCL (PF) 1 % IJ SOLN
INTRAMUSCULAR | Status: AC
  Filled 2015-05-20: qty 30

## 2015-05-20 MED ORDER — MORPHINE SULFATE (PF) 10 MG/ML IV SOLN
INTRAVENOUS | Status: AC
  Filled 2015-05-20: qty 1

## 2015-05-20 MED ORDER — ASPIRIN EC 81 MG PO TBEC
81.0000 mg | DELAYED_RELEASE_TABLET | Freq: Every day | ORAL | Status: DC
  Administered 2015-05-21 – 2015-05-23 (×3): 81 mg via ORAL
  Filled 2015-05-20 (×3): qty 1

## 2015-05-20 MED ORDER — MIDAZOLAM HCL 2 MG/2ML IJ SOLN
INTRAMUSCULAR | Status: DC | PRN
  Administered 2015-05-20: 2 mg via INTRAVENOUS

## 2015-05-20 MED ORDER — FENTANYL CITRATE (PF) 100 MCG/2ML IJ SOLN
INTRAMUSCULAR | Status: DC | PRN
  Administered 2015-05-20: 50 ug via INTRAVENOUS

## 2015-05-20 MED ORDER — FENTANYL CITRATE (PF) 100 MCG/2ML IJ SOLN
INTRAMUSCULAR | Status: AC
  Filled 2015-05-20: qty 2

## 2015-05-20 MED ORDER — ONDANSETRON HCL 4 MG/2ML IJ SOLN
4.0000 mg | Freq: Four times a day (QID) | INTRAMUSCULAR | Status: DC | PRN
  Administered 2015-05-20: 4 mg via INTRAVENOUS
  Filled 2015-05-20: qty 2

## 2015-05-20 MED ORDER — POTASSIUM CHLORIDE CRYS ER 20 MEQ PO TBCR
40.0000 meq | EXTENDED_RELEASE_TABLET | Freq: Two times a day (BID) | ORAL | Status: DC
  Administered 2015-05-20 – 2015-05-21 (×3): 40 meq via ORAL
  Filled 2015-05-20 (×3): qty 2

## 2015-05-20 MED ORDER — HEPARIN SODIUM (PORCINE) 5000 UNIT/ML IJ SOLN
5000.0000 [IU] | Freq: Three times a day (TID) | INTRAMUSCULAR | Status: DC
  Administered 2015-05-21 – 2015-05-23 (×7): 5000 [IU] via SUBCUTANEOUS
  Filled 2015-05-20 (×7): qty 1

## 2015-05-20 MED ORDER — MORPHINE SULFATE (PF) 2 MG/ML IV SOLN: 2.0000 mg | INTRAVENOUS | Status: DC | PRN

## 2015-05-20 MED ORDER — FUROSEMIDE 10 MG/ML IJ SOLN
INTRAMUSCULAR | Status: AC
  Filled 2015-05-20: qty 4

## 2015-05-20 MED ORDER — SODIUM CHLORIDE 0.9 % IV SOLN: 250.0000 mL | INTRAVENOUS | Status: DC | PRN

## 2015-05-20 MED ORDER — SODIUM CHLORIDE 0.9% FLUSH
3.0000 mL | INTRAVENOUS | Status: DC | PRN
Start: 2015-05-20 — End: 2015-05-23

## 2015-05-20 MED ORDER — HEPARIN (PORCINE) IN NACL 2-0.9 UNIT/ML-% IJ SOLN
INTRAMUSCULAR | Status: AC
  Filled 2015-05-20: qty 1000

## 2015-05-20 MED ORDER — MORPHINE SULFATE (PF) 4 MG/ML IV SOLN
INTRAVENOUS | Status: DC | PRN
  Administered 2015-05-20: 2 mg via INTRAVENOUS

## 2015-05-20 MED ORDER — HEPARIN (PORCINE) IN NACL 2-0.9 UNIT/ML-% IJ SOLN
INTRAMUSCULAR | Status: DC | PRN
  Administered 2015-05-20: 1500 mL

## 2015-05-20 MED ORDER — MIDAZOLAM HCL 2 MG/2ML IJ SOLN
INTRAMUSCULAR | Status: AC
  Filled 2015-05-20: qty 2

## 2015-05-20 MED ORDER — ASPIRIN 81 MG PO CHEW: 81.0000 mg | CHEWABLE_TABLET | ORAL | Status: DC

## 2015-05-20 MED ORDER — MORPHINE SULFATE (PF) 10 MG/ML IV SOLN: INTRAVENOUS | Status: DC | PRN

## 2015-05-20 MED ORDER — ACETAMINOPHEN 325 MG PO TABS
650.0000 mg | ORAL_TABLET | ORAL | Status: DC | PRN
  Administered 2015-05-20 – 2015-05-22 (×3): 650 mg via ORAL
  Filled 2015-05-20 (×3): qty 2

## 2015-05-20 MED ORDER — IOPAMIDOL (ISOVUE-370) INJECTION 76%
INTRAVENOUS | Status: DC | PRN
  Administered 2015-05-20: 50 mL via INTRA_ARTERIAL

## 2015-05-20 MED ORDER — SODIUM CHLORIDE 0.9% FLUSH: 3.0000 mL | Freq: Two times a day (BID) | INTRAVENOUS | Status: DC

## 2015-05-20 MED ORDER — IOPAMIDOL (ISOVUE-370) INJECTION 76%
INTRAVENOUS | Status: AC
  Filled 2015-05-20: qty 100

## 2015-05-20 MED ORDER — SODIUM CHLORIDE 0.9% FLUSH: 3.0000 mL | INTRAVENOUS | Status: DC | PRN

## 2015-05-20 MED ORDER — MORPHINE SULFATE (PF) 10 MG/ML IV SOLN
INTRAVENOUS | Status: DC | PRN
  Administered 2015-05-20: 2 mL via INTRAVENOUS

## 2015-05-20 MED ORDER — SODIUM CHLORIDE 0.9 % WEIGHT BASED INFUSION
3.0000 mL/kg/h | INTRAVENOUS | Status: DC
  Administered 2015-05-20: 3 mL/kg/h via INTRAVENOUS

## 2015-05-20 MED ORDER — FUROSEMIDE 10 MG/ML IJ SOLN
80.0000 mg | Freq: Two times a day (BID) | INTRAMUSCULAR | Status: DC
  Administered 2015-05-20: 80 mg via INTRAVENOUS
  Filled 2015-05-20: qty 8

## 2015-05-20 MED ORDER — SODIUM CHLORIDE 0.9% FLUSH
3.0000 mL | Freq: Two times a day (BID) | INTRAVENOUS | Status: DC
  Administered 2015-05-20 – 2015-05-23 (×4): 3 mL via INTRAVENOUS

## 2015-05-20 MED ORDER — FUROSEMIDE 10 MG/ML IJ SOLN
INTRAMUSCULAR | Status: DC | PRN
  Administered 2015-05-20 (×2): 40 mg via INTRAVENOUS

## 2015-05-20 SURGICAL SUPPLY — 12 items
CATH INFINITI 5FR MULTPACK ANG (CATHETERS) ×2 IMPLANT
CATH SITESEER 5F NTR (CATHETERS) ×2 IMPLANT
CATH SWAN GANZ 7F STRAIGHT (CATHETERS) ×2 IMPLANT
HOVERMATT SINGLE USE (MISCELLANEOUS) ×2 IMPLANT
KIT HEART LEFT (KITS) ×3 IMPLANT
KIT HEART RIGHT NAMIC (KITS) ×2 IMPLANT
PACK CARDIAC CATHETERIZATION (CUSTOM PROCEDURE TRAY) ×3 IMPLANT
SHEATH PINNACLE 5F 10CM (SHEATH) ×2 IMPLANT
SHEATH PINNACLE 7F 10CM (SHEATH) ×2 IMPLANT
TRANSDUCER W/STOPCOCK (MISCELLANEOUS) ×5 IMPLANT
TUBING CIL FLEX 10 FLL-RA (TUBING) ×3 IMPLANT
WIRE EMERALD 3MM-J .035X150CM (WIRE) ×2 IMPLANT

## 2015-05-20 NOTE — Progress Notes (Signed)
IV saline locked. 

## 2015-05-20 NOTE — Progress Notes (Signed)
Site area: rt groin Site Prior to Removal:  Level 0 Pressure Applied For:  20 minutes Manual:   yes Patient Status During Pull:  stable Post Pull Site:  Level  0 Post Pull Instructions Given:  yes Post Pull Pulses Present: yes Dressing Applied:  tegaderm Bedrest begins @  1650 Comments:   

## 2015-05-20 NOTE — Patient Instructions (Signed)
Medication Instructions:  Your physician recommends that you continue on your current medications as directed. Please refer to the Current Medication list given to you today.   Labwork: WILL BE DONE AT SHORT STAY BMET, CBC W/DIFF, PT/INR  Testing/Procedures: Your physician has requested that you have a cardiac catheterization. Cardiac catheterization is used to diagnose and/or treat various heart conditions. Doctors may recommend this procedure for a number of different reasons. The most common reason is to evaluate chest pain. Chest pain can be a symptom of coronary artery disease (CAD), and cardiac catheterization can show whether plaque is narrowing or blocking your heart's arteries. This procedure is also used to evaluate the valves, as well as measure the blood flow and oxygen levels in different parts of your heart. For further information please visit [email or web address]. Please follow instruction sheet, as given.  GO TO [place] AND REPORT TO SHORT STAY NOW   Follow-Up: Your physician recommends that you schedule a follow-up appointment in:  WILL BE ARRANGED AFTER YOUR PROCEDURE   Any Other Special Instructions Will Be Listed Below (If Applicable).     If you need a refill on your cardiac medications before your next appointment, please call your pharmacy.

## 2015-05-21 ENCOUNTER — Encounter (HOSPITAL_COMMUNITY)
Admission: AD | Disposition: A | Discharge status: Home / Self Care | Payer: Self-pay | Source: Ambulatory Visit | Attending: Cardiology

## 2015-05-21 ENCOUNTER — Encounter (HOSPITAL_COMMUNITY): Payer: Self-pay | Admitting: Cardiovascular Disease

## 2015-05-21 ENCOUNTER — Ambulatory Visit (HOSPITAL_COMMUNITY): Payer: Managed Care, Other (non HMO)

## 2015-05-21 DIAGNOSIS — I272 Other secondary pulmonary hypertension: Secondary | ICD-10-CM

## 2015-05-21 DIAGNOSIS — I10 Essential (primary) hypertension: Secondary | ICD-10-CM

## 2015-05-21 DIAGNOSIS — I472 Ventricular tachycardia: Secondary | ICD-10-CM

## 2015-05-21 DIAGNOSIS — I4729 Other ventricular tachycardia: Secondary | ICD-10-CM

## 2015-05-21 DIAGNOSIS — I447 Left bundle-branch block, unspecified: Secondary | ICD-10-CM

## 2015-05-21 DIAGNOSIS — I509 Heart failure, unspecified: Secondary | ICD-10-CM

## 2015-05-21 LAB — BASIC METABOLIC PANEL
ANION GAP: 13 (ref 5–15)
BUN: 9 mg/dL (ref 6–20)
CALCIUM: 9.4 mg/dL (ref 8.9–10.3)
CHLORIDE: 100 mmol/L — AB (ref 101–111)
CO2: 30 mmol/L (ref 22–32)
CREATININE: 0.84 mg/dL (ref 0.44–1.00)
GFR calc non Af Amer: >60 mL/min (ref >60)
GLUCOSE: 111 mg/dL — AB (ref 65–99)
Potassium: 3.8 mmol/L (ref 3.5–5.1)
Sodium: 143 mmol/L (ref 135–145)

## 2015-05-21 LAB — ECHOCARDIOGRAM COMPLETE
HEIGHTINCHES: 64 in
WEIGHTICAEL: 4042.35 [oz_av]

## 2015-05-21 LAB — CBC
HEMATOCRIT: 41.8 % (ref 36.0–46.0)
HEMOGLOBIN: 13.7 g/dL (ref 12.0–15.0)
MCH: 30 pg (ref 26.0–34.0)
MCHC: 32.8 g/dL (ref 30.0–36.0)
MCV: 91.5 fL (ref 78.0–100.0)
Platelets: 317 10*3/uL (ref 150–400)
RBC: 4.57 MIL/uL (ref 3.87–5.11)
RDW: 14.1 % (ref 11.5–15.5)
WBC: 9.7 10*3/uL (ref 4.0–10.5)

## 2015-05-21 LAB — BRAIN NATRIURETIC PEPTIDE: B Natriuretic Peptide: 214.8 pg/mL — ABNORMAL HIGH (ref 0.0–100.0)

## 2015-05-21 LAB — MAGNESIUM: MAGNESIUM: 1.8 mg/dL (ref 1.7–2.4)

## 2015-05-21 SURGERY — RIGHT/LEFT HEART CATH AND CORONARY ANGIOGRAPHY
Anesthesia: LOCAL

## 2015-05-21 MED ORDER — FUROSEMIDE 10 MG/ML IJ SOLN
40.0000 mg | Freq: Two times a day (BID) | INTRAMUSCULAR | Status: DC
  Administered 2015-05-21 (×2): 40 mg via INTRAVENOUS
  Filled 2015-05-21 (×2): qty 4

## 2015-05-21 MED ORDER — PERFLUTREN LIPID MICROSPHERE
INTRAVENOUS | Status: AC
  Filled 2015-05-21: qty 10

## 2015-05-21 MED ORDER — CARVEDILOL 6.25 MG PO TABS
6.2500 mg | ORAL_TABLET | Freq: Two times a day (BID) | ORAL | Status: DC
  Administered 2015-05-21 – 2015-05-23 (×5): 6.25 mg via ORAL
  Filled 2015-05-21 (×5): qty 1

## 2015-05-21 MED ORDER — IRBESARTAN 300 MG PO TABS
150.0000 mg | ORAL_TABLET | Freq: Every day | ORAL | Status: DC
  Administered 2015-05-21 – 2015-05-23 (×3): 150 mg via ORAL
  Filled 2015-05-21 (×4): qty 1

## 2015-05-21 MED ORDER — PNEUMOCOCCAL VAC POLYVALENT 25 MCG/0.5ML IJ INJ
0.5000 mL | INJECTION | INTRAMUSCULAR | Status: AC
  Administered 2015-05-23: 0.5 mL via INTRAMUSCULAR
  Filled 2015-05-21: qty 0.5

## 2015-05-21 MED ORDER — PERFLUTREN LIPID MICROSPHERE
INTRAVENOUS | Status: AC
  Administered 2015-05-21: 2 mL
  Filled 2015-05-21: qty 10

## 2015-05-21 NOTE — Progress Notes (Signed)
TELEMETRY: Reviewed telemetry pt in NSR with runs of NSVT longest 32 beats: Filed Vitals:   05/20/15 2200 05/21/15 0000 05/21/15 0200 05/21/15 0400  BP: 131/80 107/87 115/73 124/74  Pulse: 67 60 56   Temp:    98.2 F (36.8 C)  TempSrc:    Oral  Resp: 22 16 14 18   Height:      Weight:      SpO2: 96% 94% 95% 94%    Intake/Output Summary (Last 24 hours) at 05/21/15 0748 Last data filed at 05/21/15 0557  Gross per 24 hour  Intake    325 ml  Output   7670 ml  Net  -7345 ml   Filed Weights   05/20/15 1054 05/20/15 1728  Weight: 117.935 kg (260 lb) 114.6 kg (252 lb 10.4 oz)    Subjective Feels much better today. Breathing is improved. Less chest tightness and abdominal bloating.   Marland Kitchen aspirin EC  81 mg Oral Daily  . furosemide  80 mg Intravenous Q12H  . heparin  5,000 Units Subcutaneous 3 times per day  . potassium chloride  40 mEq Oral BID  . sodium chloride flush  3 mL Intravenous Q12H  . sodium chloride flush  3 mL Intravenous Q12H      LABS: Basic Metabolic Panel:  Recent Labs  05/20/15 1159 05/21/15 0355  NA 141 143  K 3.8 3.8  CL 110 100*  CO2 20* 30  GLUCOSE 97 111*  BUN 9 9  CREATININE 0.77 0.84  CALCIUM 9.3 9.4   Liver Function Tests: No results for input(s): AST, ALT, ALKPHOS, BILITOT, PROT, ALBUMIN in the last 72 hours. No results for input(s): LIPASE, AMYLASE in the last 72 hours. CBC:  Recent Labs  05/20/15 1159 05/21/15 0355  WBC 9.7 9.7  HGB 12.9 13.7  HCT 39.3 41.8  MCV 91.8 91.5  PLT 292 317   Cardiac Enzymes: No results for input(s): CKTOTAL, CKMB, CKMBINDEX, TROPONINI in the last 72 hours. BNP: No results for input(s): PROBNP in the last 72 hours. D-Dimer: No results for input(s): DDIMER in the last 72 hours. Hemoglobin A1C: No results for input(s): HGBA1C in the last 72 hours. Fasting Lipid Panel: No results for input(s): CHOL, HDL, LDLCALC, TRIG, CHOLHDL, LDLDIRECT in the last 72 hours. Thyroid Function Tests: No results  for input(s): TSH, T4TOTAL, T3FREE, THYROIDAB in the last 72 hours.  Invalid input(s): FREET3   Radiology/Studies:  Dg Chest 2 View  05/20/2015  CLINICAL DATA:  Shortness of breath, preoperative evaluation for upcoming catheterization. EXAM: CHEST  2 VIEW COMPARISON:  05/14/2014 FINDINGS: Cardiac shadow is mildly enlarged. Mild central vascular congestion is noted. Some bibasilar opacities are noted increased from the prior exam. This may be related to a component of interstitial edema and atelectasis. No focal confluent infiltrate is seen. No bony abnormality is noted. IMPRESSION: Changes consistent with mild CHF with superimposed bibasilar atelectatic changes. Electronically Signed   By: Inez Catalina M.D.   On: 05/20/2015 13:41   Ecg NSR with LBBB.   Procedures    Right/Left Heart Cath and Coronary Angiography    Conclusion    Significant right heart pressure elevation with severe pulmonary hypertension.  Normal coronary arteries.  Probable nonischemic cardiomyopathy.  RECOMMENDATION: The patient will be admitted to the TCU for close observation and IV diuresis. A 2-D echo Doppler study will be done to assess left ventricular systolic and diastolic function. Medication titration will be necessary.    Indications    Acute  systolic heart failure (HCC) [I50.21 (ICD-10-CM)]   Pulmonary hypertension ([place]) [I27.2 (ICD-10-CM)]    Technique and Indications    [name] is a 60 year old obese African-American female who is followed by Dr. Radford Pax. She has a history of hypertension, left bundle branch block, and previously had an abnormal nuclear stress test. In the past her ejection fraction has been 45%. She was admitted from the office today with increasing symptoms of shortness of breath and recent chest pain. She was referred for cardiac catheterization from her office evaluation.  The patient was brought to the second floor [facility]iac cath lab in the postabsorptive  state. Versed 2 mg and fentanyl 50 mcg were administered for conscious sedation. The right groin was prepped and draped in sterile fashion and a 5 [place] arterial sheath and 7 French venous sheath were inserted without difficulty. A Swan-Ganz catheter was advanced into the venous sheath and pressures were obtained in the right atrium, right ventricle, pulmonary artery, and pulmonary capillary wedge position. Cardiac outputs were obtained by the thermodilution and assumed Fick methods. Oxygen saturation was obtained in the pulmonary artery and aorta. A pigtail catheter was inserted and simultaneous AO/PA pressures were recorded. The pigtail catheter was advanced into the left ventricle and simultaneous left ventricular and PCW pressures were recorded. Left ventriculography was not performed due to significant LVEDP elevation. A left ventricle to aorta pullback was performed. The pigtail catheter was then removed and diagnostic catheterization to delineate the coronary anatomy was performed utilizing 5 French Judkins 4 left and right diagnostic catheters. All catheters were removed and the patient. With the patient's significant right heart pressure elevation she was given Lasix 40 mg 2 and morphine sulfate 2 mg 2 and a Foley catheter was inserted. This resulted in brisk diuresis of 400 cc of urine. Hemostasis was obtained by direct manual pressure. The patient tolerated the procedure well and will be admitted to the TCU for further evaluation and treatment. During this procedure the patient was administered a total of Versed 2 mg and Fentanyl 50 mg to achieve and maintain moderate conscious sedation. The patient's heart rate, blood pressure, and oxygen saturation were monitored continuously during the procedure. The period of conscious sedation was 53 minutes, of which I was present face-to-face 100% of this time. Estimated blood loss <50 mL. There were no immediate complications during the procedure.      Coronary Findings    Dominance: Right   Left Main  Vessel was injected. Vessel is normal in caliber. Vessel is angiographically normal.     Left Anterior Descending  Vessel was injected. Vessel is normal in caliber. Vessel is angiographically normal.     Left Circumflex  Vessel was injected. Vessel is normal in caliber. Vessel is angiographically normal.     Right Coronary Artery  Vessel was injected. Vessel is normal in caliber. Vessel is angiographically normal.       Right Heart Pressures Hemodynamic findings consistent with severe pulmonary hypertension. RA: A-wave 21, V wave 20, mean 17 RV: 67/10/21 PA: 67/37, mean 48 PW: A-wave 37, V wave 43, mean 36  AO: 150/97 PA: 66/36; mean 50  LV: 153/24/44 PW: A-wave 42, V wave 49, mean 42  LV: 152/33/43 AO: 153/97  Cardiac output by the thermodilution method 4.0 L/m with a cardiac index of 1.8 L/m/m; by the Fick method 5.2 L/m with an index of 2.4 L/m/m.    PHYSICAL EXAM General: Well developed, well nourished, in no acute distress. Head: Normal  Neck: Negative for carotid bruits. JVD  Elevated 6 cm. No adenopathy Lungs: Clear bilaterally to auscultation without wheezes, rales, or rhonchi. BS reduced in bases Heart: RRR S1 S2 without murmurs, rubs, or gallops.  Abdomen: Soft, non-tender, non-distended with normoactive bowel sounds. No hepatomegaly. No rebound/guarding. No obvious abdominal masses. Msk:  Strength and tone appears normal for age. Extremities: 1+edema.  Distal pedal pulses are 2+ and equal bilaterally. Neuro: Alert and oriented X 3. Moves all extremities spontaneously. Psych:  Responds to questions appropriately with a normal affect.  ASSESSMENT AND PLAN: 1. Acute CHF. Suspect combination of systolic and diastolic dysfunction. Echo pending. Probable nonischemic CM with normal coronary arteries by cath. Prior Echo in November showed EF 50%. In 2014 Echo and Myoview showed EF 45%. Markedly increased LV  filling pressures and moderate to severe pulmonary HTN on cardiac cath. Excellent response to diuretics. I/O negative 7345. Weight down 8 lbs. Will reduce lasix to 40 mg bid. Add Coreg and resume Micardis.  Later add aldactone if BP allows. Follow up on Echo today.   2. 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 NSVT. Asymptomatic. Check Mg++ and replete. Replete K+ to >4. Start beta blocker. Monitor on telemetry.  4. HTN  5. Obesity with OSA  6. History of tobacco abuse- quit 5 yrs ago.   7. Hyperlipidemia on Lescol.   8. LBBB   Present on Admission:  . Acute systolic heart failure ([place]) . LBBB (left bundle branch block) . OSA (obstructive sleep apnea) . Essential hypertension, benign . Mixed hyperlipidemia . Pulmonary HTN ([place])  Signed, Nyah Shepherd [place], [place] 05/21/2015 7:48 AM

## 2015-05-21 NOTE — Progress Notes (Signed)
  Echocardiogram 2D Echocardiogram with Definity has been performed.  Darlina Sicilian M 05/21/2015, 9:04 AM

## 2015-05-21 NOTE — Care Management Note (Signed)
Case Management Note  Patient Details  Name: [name] MRN: [id number] Date of Birth: May 07, 1955  Subjective/Objective:    Adm w heart failure               Action/Plan: lives w sign other, pcp dr pickard   Expected Discharge Date:                  Expected Discharge Plan:  [place]  In-House Referral:     Discharge planning Services     Post Acute Care Choice:    Choice offered to:     DME Arranged:    DME Agency:     HH Arranged:    [place]:     Status of Service:     Medicare Important Message Given:    Date Medicare IM Given:    Medicare IM give by:    Date Additional Medicare IM Given:    Additional Medicare Important Message give by:     If discussed at [place] of Stay Meetings, dates discussed:    Additional Comments: ur review done  Lacretia Leigh, RN 05/21/2015, 8:09 AM

## 2015-05-22 DIAGNOSIS — G4733 Obstructive sleep apnea (adult) (pediatric): Secondary | ICD-10-CM

## 2015-05-22 LAB — BASIC METABOLIC PANEL
Anion gap: 11 (ref 5–15)
BUN: 12 mg/dL (ref 6–20)
CHLORIDE: 101 mmol/L (ref 101–111)
CO2: 29 mmol/L (ref 22–32)
CREATININE: 0.91 mg/dL (ref 0.44–1.00)
Calcium: 9.5 mg/dL (ref 8.9–10.3)
Glucose, Bld: 140 mg/dL — ABNORMAL HIGH (ref 65–99)
POTASSIUM: 3.8 mmol/L (ref 3.5–5.1)
SODIUM: 141 mmol/L (ref 135–145)

## 2015-05-22 MED ORDER — SPIRONOLACTONE 25 MG PO TABS
12.5000 mg | ORAL_TABLET | Freq: Every day | ORAL | Status: DC
Start: 2015-05-22 — End: 2015-05-23
  Administered 2015-05-22 – 2015-05-23 (×2): 12.5 mg via ORAL
  Filled 2015-05-22 (×2): qty 1

## 2015-05-22 MED ORDER — NON FORMULARY
80.0000 mg | Freq: Every day | Status: DC
Start: 2015-05-22 — End: 2015-05-22

## 2015-05-22 MED ORDER — FUROSEMIDE 40 MG PO TABS
40.0000 mg | ORAL_TABLET | Freq: Every day | ORAL | Status: DC
  Administered 2015-05-22 – 2015-05-23 (×2): 40 mg via ORAL
  Filled 2015-05-22 (×2): qty 1

## 2015-05-22 MED ORDER — ROSUVASTATIN CALCIUM 10 MG PO TABS
5.0000 mg | ORAL_TABLET | Freq: Every day | ORAL | Status: DC
  Administered 2015-05-22: 5 mg via ORAL
  Filled 2015-05-22: qty 1

## 2015-05-22 NOTE — Significant Event (Signed)
Report called to receiving RN Mickel Baas on 3E.  All patient belongs gathered and to be transferred with patient.  Vital signs stable, patient A&O, groin site level 0.

## 2015-05-22 NOTE — Progress Notes (Signed)
Pt with 6 bts of Vtach on the monitor. Vitals obtained and are as follows: BP 88/62 and HR 63 NSR. Pt denies CP, lightheadedness, nausea. PA on call made aware and no orders received at this time. Will continue to monitor.

## 2015-05-22 NOTE — Progress Notes (Signed)
Re: Fluvastatin (Lescol XL) Nonformulary Request  I called and spoke with Ms. Mcsorley asking her to bring in her medication from home for use while inpatient.  We discussed the various statins and her previous history of myalgias.  She decided that she would rather try Crestor (our formulary agent) than bring in her medication from home.  I advised her to alert her nurse and/or pharmacy if she developed myalgias with Crestor.  [employer], Pharm.D., BCPS Clinical Pharmacist Pager [phone] 05/22/2015 7:22 PM

## 2015-05-22 NOTE — Progress Notes (Signed)
TELEMETRY: Reviewed telemetry pt in NSR with 6 beat run of NSVT last night: Filed Vitals:   05/21/15 2215 05/21/15 2345 05/22/15 0345 05/22/15 0750  BP: 88/62 112/76 100/69 110/59  Pulse: 63 64 65 65  Temp:  97.7 F (36.5 C) 97.3 F (36.3 C) 97.5 F (36.4 C)  TempSrc:  Oral Oral Oral  Resp: 18 21 19 15   Height:      Weight:      SpO2: 95% 95% 96% 99%    Intake/Output Summary (Last 24 hours) at 05/22/15 0858 Last data filed at 05/22/15 0400  Gross per 24 hour  Intake    906 ml  Output    450 ml  Net    456 ml   Filed Weights   05/20/15 1054 05/20/15 1728  Weight: 117.935 kg (260 lb) 114.6 kg (252 lb 10.4 oz)    Subjective Feels well. Breathing is improved. Less chest tightness and abdominal bloating.   Marland Kitchen aspirin EC  81 mg Oral Daily  . carvedilol  6.25 mg Oral BID WC  . furosemide  40 mg Oral Daily  . heparin  5,000 Units Subcutaneous 3 times per day  . irbesartan  150 mg Oral Daily  . pneumococcal 23 valent vaccine  0.5 mL Intramuscular Tomorrow-1000  . sodium chloride flush  3 mL Intravenous Q12H  . sodium chloride flush  3 mL Intravenous Q12H  . spironolactone  12.5 mg Oral Daily      LABS: Basic Metabolic Panel:  Recent Labs  05/20/15 1159 05/21/15 0355 05/21/15 0919  NA 141 143  --   K 3.8 3.8  --   CL 110 100*  --   CO2 20* 30  --   GLUCOSE 97 111*  --   BUN 9 9  --   CREATININE 0.77 0.84  --   CALCIUM 9.3 9.4  --   MG  --   --  1.8   Liver Function Tests: No results for input(s): AST, ALT, ALKPHOS, BILITOT, PROT, ALBUMIN in the last 72 hours. No results for input(s): LIPASE, AMYLASE in the last 72 hours. CBC:  Recent Labs  05/20/15 1159 05/21/15 0355  WBC 9.7 9.7  HGB 12.9 13.7  HCT 39.3 41.8  MCV 91.8 91.5  PLT 292 317   Cardiac Enzymes: No results for input(s): CKTOTAL, CKMB, CKMBINDEX, TROPONINI in the last 72 hours. BNP: No results for input(s): PROBNP in the last 72 hours. D-Dimer: No results for input(s): DDIMER in the  last 72 hours. Hemoglobin A1C: No results for input(s): HGBA1C in the last 72 hours. Fasting Lipid Panel: No results for input(s): CHOL, HDL, LDLCALC, TRIG, CHOLHDL, LDLDIRECT in the last 72 hours. Thyroid Function Tests: No results for input(s): TSH, T4TOTAL, T3FREE, THYROIDAB in the last 72 hours.  Invalid input(s): FREET3   Radiology/Studies:  Dg Chest 2 View  05/20/2015  CLINICAL DATA:  Shortness of breath, preoperative evaluation for upcoming catheterization. EXAM: CHEST  2 VIEW COMPARISON:  05/14/2014 FINDINGS: Cardiac shadow is mildly enlarged. Mild central vascular congestion is noted. Some bibasilar opacities are noted increased from the prior exam. This may be related to a component of interstitial edema and atelectasis. No focal confluent infiltrate is seen. No bony abnormality is noted. IMPRESSION: Changes consistent with mild CHF with superimposed bibasilar atelectatic changes. Electronically Signed   By: Inez Catalina M.D.   On: 05/20/2015 13:41   Ecg NSR with LBBB.   Procedures    Right/Left Heart Cath and Coronary  Angiography    Conclusion    Significant right heart pressure elevation with severe pulmonary hypertension.  Normal coronary arteries.  Probable nonischemic cardiomyopathy.  RECOMMENDATION: The patient will be admitted to the TCU for close observation and IV diuresis. A 2-D echo Doppler study will be done to assess left ventricular systolic and diastolic function. Medication titration will be necessary.    Indications    Acute systolic heart failure (HCC) [I50.21 (ICD-10-CM)]   Pulmonary hypertension ([place]) [I27.2 (ICD-10-CM)]    Technique and Indications    [name] is a 60 year old obese African-American female who is followed by Dr. Radford Pax. She has a history of hypertension, left bundle branch block, and previously had an abnormal nuclear stress test. In the past her ejection fraction has been 45%. She was admitted from the office today  with increasing symptoms of shortness of breath and recent chest pain. She was referred for cardiac catheterization from her office evaluation.  The patient was brought to the second floor  Cardiac cath lab in the postabsorptive state. Versed 2 mg and fentanyl 50 mcg were administered for conscious sedation. The right groin was prepped and draped in sterile fashion and a 5 [place] arterial sheath and 7 French venous sheath were inserted without difficulty. A Swan-Ganz catheter was advanced into the venous sheath and pressures were obtained in the right atrium, right ventricle, pulmonary artery, and pulmonary capillary wedge position. Cardiac outputs were obtained by the thermodilution and assumed Fick methods. Oxygen saturation was obtained in the pulmonary artery and aorta. A pigtail catheter was inserted and simultaneous AO/PA pressures were recorded. The pigtail catheter was advanced into the left ventricle and simultaneous left ventricular and PCW pressures were recorded. Left ventriculography was not performed due to significant LVEDP elevation. A left ventricle to aorta pullback was performed. The pigtail catheter was then removed and diagnostic catheterization to delineate the coronary anatomy was performed utilizing 5 French Judkins 4 left and right diagnostic catheters. All catheters were removed and the patient. With the patient's significant right heart pressure elevation she was given Lasix 40 mg 2 and morphine sulfate 2 mg 2 and a Foley catheter was inserted. This resulted in brisk diuresis of 400 cc of urine. Hemostasis was obtained by direct manual pressure. The patient tolerated the procedure well and will be admitted to the TCU for further evaluation and treatment. During this procedure the patient was administered a total of Versed 2 mg and Fentanyl 50 mg to achieve and maintain moderate conscious sedation. The patient's heart rate, blood pressure, and oxygen saturation were monitored  continuously during the procedure. The period of conscious sedation was 53 minutes, of which I was present face-to-face 100% of this time. Estimated blood loss <50 mL. There were no immediate complications during the procedure.    Coronary Findings    Dominance: Right   Left Main  Vessel was injected. Vessel is normal in caliber. Vessel is angiographically normal.     Left Anterior Descending  Vessel was injected. Vessel is normal in caliber. Vessel is angiographically normal.     Left Circumflex  Vessel was injected. Vessel is normal in caliber. Vessel is angiographically normal.     Right Coronary Artery  Vessel was injected. Vessel is normal in caliber. Vessel is angiographically normal.       Right Heart Pressures Hemodynamic findings consistent with severe pulmonary hypertension. RA: A-wave 21, V wave 20, mean 17 RV: 67/10/21 PA: 67/37, mean 48 PW: A-wave 37, V wave 43,  mean 36  AO: 150/97 PA: 66/36; mean 50  LV: 153/24/44 PW: A-wave 42, V wave 49, mean 42  LV: 152/33/43 AO: 153/97  Cardiac output by the thermodilution method 4.0 L/m with a cardiac index of 1.8 L/m/m; by the Fick method 5.2 L/m with an index of 2.4 L/m/m.   Echo: Study Conclusions  - Left ventricle: The cavity size was mildly dilated. Wall  thickness was normal. Systolic function was moderately to  severely reduced. The estimated ejection fraction was in the  range of 30% to 35%. - Mitral valve: Calcified annulus. Mildly thickened leaflets . - Left atrium: The atrium was mildly dilated. - Right ventricle: RV is not well enough to evaluate function  Impressions:  - Poor acoustic windows limit study  PHYSICAL EXAM General: Well developed, obese, in no acute distress. Head: Normal Neck: Negative for carotid bruits. JVD  Elevated 6 cm. No adenopathy Lungs: Clear bilaterally to auscultation without wheezes, rales, or rhonchi. BS reduced in bases Heart: RRR S1 S2 without murmurs,  rubs, or gallops.  Abdomen: Soft, non-tender, non-distended with normoactive bowel sounds. No hepatomegaly. No rebound/guarding. No obvious abdominal masses. Msk:  Strength and tone appears normal for age. Extremities: tr edema.  Distal pedal pulses are 2+ and equal bilaterally. Neuro: Alert and oriented X 3. Moves all extremities spontaneously. Psych:  Responds to questions appropriately with a normal affect.  ASSESSMENT AND PLAN: 1. Acute systolic CHF. Suspect combination of systolic and diastolic dysfunction. Echo shows EF 30-35%. Nonischemic CM with normal coronary arteries by cath. Prior Echo in November showed EF 50%. In 2014 Echo and Myoview showed EF 45%. Markedly increased LV filling pressures and moderate to severe pulmonary HTN on cardiac cath. Initial excellent response to diuretics. Since then I/O incomplete and weight not recorded.  Clinically improved. Will switch lasix to po today. Add aldactone. On Coreg and ARB. Low BP limiting further titration. Will transfer to telemetry today and ambulate. 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   2. 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 NSVT. Asymptomatic. Check Mg++ and replete. Replete K+ to >4. Check BMET today and daily. On beta blocker. Monitor on telemetry.  4. HTN  5. Obesity with OSA  6. History of tobacco abuse- quit 5 yrs ago.   7. Hyperlipidemia on [place].   8. LBBB   Present on Admission:  . Acute systolic heart failure ([place]) . LBBB (left bundle branch block) . OSA (obstructive sleep apnea) . Essential hypertension, benign . Mixed hyperlipidemia . Pulmonary HTN ([place])  Signed, Peter Martinique, [place] 05/22/2015 8:58 AM

## 2015-05-23 ENCOUNTER — Other Ambulatory Visit: Payer: Self-pay | Admitting: Physician Assistant

## 2015-05-23 ENCOUNTER — Encounter (HOSPITAL_COMMUNITY): Payer: Self-pay | Admitting: Cardiology

## 2015-05-23 DIAGNOSIS — I428 Other cardiomyopathies: Secondary | ICD-10-CM

## 2015-05-23 DIAGNOSIS — I5021 Acute systolic (congestive) heart failure: Secondary | ICD-10-CM

## 2015-05-23 DIAGNOSIS — I119 Hypertensive heart disease without heart failure: Secondary | ICD-10-CM

## 2015-05-23 LAB — BASIC METABOLIC PANEL
ANION GAP: 10 (ref 5–15)
BUN: 13 mg/dL (ref 6–20)
CHLORIDE: 100 mmol/L — AB (ref 101–111)
CO2: 28 mmol/L (ref 22–32)
CREATININE: 0.76 mg/dL (ref 0.44–1.00)
Calcium: 9.3 mg/dL (ref 8.9–10.3)
GFR calc non Af Amer: >60 mL/min (ref >60)
Glucose, Bld: 124 mg/dL — ABNORMAL HIGH (ref 65–99)
Potassium: 4.1 mmol/L (ref 3.5–5.1)
Sodium: 138 mmol/L (ref 135–145)

## 2015-05-23 MED ORDER — SPIRONOLACTONE 25 MG PO TABS: 12.5000 mg | ORAL_TABLET | Freq: Every day | ORAL | Status: DC

## 2015-05-23 MED ORDER — ROSUVASTATIN CALCIUM 5 MG PO TABS: 5.0000 mg | ORAL_TABLET | Freq: Every day | ORAL | Status: DC

## 2015-05-23 MED ORDER — CARVEDILOL 6.25 MG PO TABS: 6.2500 mg | ORAL_TABLET | Freq: Two times a day (BID) | ORAL | Status: DC

## 2015-05-23 MED ORDER — HYDRALAZINE HCL 25 MG PO TABS
25.0000 mg | ORAL_TABLET | Freq: Three times a day (TID) | ORAL | Status: DC
  Administered 2015-05-23: 25 mg via ORAL
  Filled 2015-05-23 (×2): qty 1

## 2015-05-23 MED ORDER — FUROSEMIDE 40 MG PO TABS: 40.0000 mg | ORAL_TABLET | Freq: Every day | ORAL | Status: DC

## 2015-05-23 MED ORDER — IRBESARTAN 150 MG PO TABS: 150.0000 mg | ORAL_TABLET | Freq: Every day | ORAL | Status: DC

## 2015-05-23 MED ORDER — HYDRALAZINE HCL 25 MG PO TABS: 25.0000 mg | ORAL_TABLET | Freq: Three times a day (TID) | ORAL | Status: DC

## 2015-05-23 NOTE — Discharge Summary (Signed)
Discharge Summary    Patient ID: [name],  MRN: [id number], DOB/AGE: [birth year] 60 y.o.  Admit date: 05/20/2015 Discharge date: 05/23/2015  Primary Care Provider: [facility] Primary Cardiologist: Dr. Radford Pax  Discharge Diagnoses    Principal Problem:   Acute systolic heart failure ([place]) Active Problems:   Mixed hyperlipidemia   OSA (obstructive sleep apnea)   LBBB (left bundle branch block)   Chronic combined systolic and diastolic CHF (congestive heart failure) (HCC)   Pulmonary HTN (HCC)   NSVT (nonsustained ventricular tachycardia) (HCC)   Morbid obesity ([place])   Hypertensive heart disease   Nonischemic cardiomyopathy (HCC)   Allergies Allergies  Allergen Reactions  . Atorvastatin     REACTION: mylagias  . Lipitor [Atorvastatin Calcium] Other (See Comments)    myalgia  . Pravachol Other (See Comments)    myalgia  . Pravastatin     REACTION: myalgias  . Pravastatin Sodium     REACTION: myalgias  . Sulfa Antibiotics Hives  . Sulfonamide Derivatives     REACTION: hives    Diagnostic Studies/Procedures    Cardiac cath 05/20/2015 Conclusion    Significant right heart pressure elevation with severe pulmonary hypertension.  Normal coronary arteries.  Probable nonischemic cardiomyopathy.  RECOMMENDATION: The patient will be admitted to the TCU for close observation and IV diuresis. A 2-D echo Doppler study will be done to assess left ventricular systolic and diastolic function. Medication titration will be necessary.    Echo 05/21/2015 LV EF: 30% - 35%  ------------------------------------------------------------------- Indications: CHF - 428.0.  ------------------------------------------------------------------- History: PMH: Left Bundle Branch Block. PMH: Breast Cancer. Risk factors: Hypertension. Dyslipidemia.  ------------------------------------------------------------------- Study Conclusions  - Left ventricle: The  cavity size was mildly dilated. Wall  thickness was normal. Systolic function was moderately to  severely reduced. The estimated ejection fraction was in the  range of 30% to 35%. - Mitral valve: Calcified annulus. Mildly thickened leaflets . - Left atrium: The atrium was mildly dilated. - Right ventricle: RV is not well enough to evaluate function  Impressions:  - Poor acoustic windows limit study  _____________   History of Present Illness     [name] is a 60 y.o. female who presents at request of Dr. Dennard Schaumann for chest pressure.   She has a history of HTN and LBBB who last saw Dr. Radford Pax 12/2013 for abnormal stress test done for LBBB. She was completely asymptomatic and stress test showed a moderate defect in the inferior wall with very mild reversibility that was felt to be diaphragmatic and breast attenuation artifact. She was continued on medical therapy since she was asymptomatic. Of note her EF on nuclear stress test was 45%. 2D echo confirmed EF 45%. Also sleep apnea with Cpap.  Today she tells me she has had DOE, and now chest pressure with exertion. Dr. Dennard Schaumann added BB. This began 3 weeks ago and has progressed. Now occ with rest develops chest pressure and the SOB. Has to stop with walking. EKG without changes in PCP office. On Friday.  Hospital Course     Given her presentation and abnormal stress test, she underwent left and right heart cath on 05/20/2015 which showed normal coronary, however significant right heart pressure with severe pulmonary hypertension. She likely has nonischemic cardiomyopathy. Overnight, she had a long run of 32 beats of nonsustained VT. Given her markedly elevated LV filling pressure and moderate to severe pulmonary hypertension, she was aggressively diuresed with IV Lasix. She was switched to PO  Lasix with additional spironolactone on 05/22/2015. Echocardiogram obtained on 05/21/2015 showed EF 30-35%, otherwise poor acoustic window  resulting in limited study.  Patient was seen on the following day, at which time she denies any significant shortness breath or chest discomfort. During this admission she has diuresed of total of 7.6 L. Her weight has dropped from initial 260 pounds down to 243 pounds which will serve as her dry weight. She has been tolerating PO Lasix with stable renal function. Patient is deemed stable for discharge from cardiology perspective. 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 Hydralazine was added prior to discharge, we will reassess her blood pressure on follow-up, we'll consider adding long-acting nitrates if her blood pressure is stable.In addition if fails to improve, may consider cardiac resyncronization therapy down the road.  I will arrange seven-day transition of care follow-up after discharge at which time we will obtain BMET to reassess renal function and potassium.  I have discussed with the patient standard heart failure prevention plan including sodium and fluid restriction, and daily weight check. She has been instructed to contact cardiology if her weight increased by more than 3 pounds overnight or 5 pounds in a single week.   _____________  Discharge Vitals Blood pressure 118/73, pulse 76, temperature 97.3 F (36.3 C), temperature source Oral, resp. rate 18, height 5\' 4"  (1.626 m), weight 243 lb 11.2 oz (110.542 kg), SpO2 98 %.  Filed Weights   05/20/15 1728 05/22/15 1833 05/23/15 0638  Weight: 252 lb 10.4 oz (114.6 kg) 243 lb 12.8 oz (110.587 kg) 243 lb 11.2 oz (110.542 kg)    Labs & Radiologic Studies     CBC  Recent Labs  05/21/15 0355  WBC 9.7  HGB 13.7  HCT 41.8  MCV 91.5  PLT [id number]   Basic Metabolic Panel  Recent Labs  05/21/15 0919 05/22/15 0953 05/23/15 0427  NA  --  141 138  K  --  3.8 4.1  CL  --  101 100*  CO2  --  29 28    GLUCOSE  --  140* 124*  BUN  --  12 13  CREATININE  --  0.91 0.76  CALCIUM  --  9.5 9.3  MG 1.8  --   --     Dg Chest 2 View  05/20/2015  CLINICAL DATA:  Shortness of breath, preoperative evaluation for upcoming catheterization. EXAM: CHEST  2 VIEW COMPARISON:  05/14/2014 FINDINGS: Cardiac shadow is mildly enlarged. Mild central vascular congestion is noted. Some bibasilar opacities are noted increased from the prior exam. This may be related to a component of interstitial edema and atelectasis. No focal confluent infiltrate is seen. No bony abnormality is noted. IMPRESSION: Changes consistent with mild CHF with superimposed bibasilar atelectatic changes. Electronically Signed   By: Inez Catalina M.D.   On: 05/20/2015 13:41    Disposition   Pt is being discharged home today in good condition.  Follow-up Plans & Appointments    Follow-up Information    Follow up with Sueanne Margarita, MD.   Specialty:  Cardiology   Why:  Office scheduler will contact you to arrange 1 week followup and BMET lab on the same day to check kidney function and potassium level   Contact information:   [address] [place] [address] [phone]       Schedule  an appointment as soon as possible for a visit with Odette Fraction, MD.   Specialty:  Family Medicine   Contact information:   [address] [place] [address] [phone]      Discharge Instructions    Diet - low sodium heart healthy    Complete by:  As directed      Increase activity slowly    Complete by:  As directed            Discharge Medications   Current Discharge Medication List    START taking these medications   Details  carvedilol (COREG) 6.25 MG tablet Take 1 tablet (6.25 mg total) by mouth 2 (two) times daily with a meal. Qty: 180 tablet, Refills: 3    furosemide (LASIX) 40 MG tablet Take 1 tablet (40 mg total) by mouth daily. Qty: 90 tablet, Refills: 1    hydrALAZINE (APRESOLINE)  25 MG tablet Take 1 tablet (25 mg total) by mouth 3 (three) times daily. Qty: 90 tablet, Refills: 11    irbesartan (AVAPRO) 150 MG tablet Take 1 tablet (150 mg total) by mouth daily. Qty: 90 tablet, Refills: 3    rosuvastatin (CRESTOR) 5 MG tablet Take 1 tablet (5 mg total) by mouth daily at 6 PM. Qty: 90 tablet, Refills: 3    spironolactone (ALDACTONE) 25 MG tablet Take 0.5 tablets (12.5 mg total) by mouth daily. Qty: 30 tablet, Refills: 5      CONTINUE these medications which have NOT CHANGED   Details  aspirin 81 MG tablet Take 81 mg by mouth daily.    clobetasol cream (TEMOVATE) [id number] % Apply 1 application topically 2 (two) times daily as needed (for rash).     fluticasone (FLONASE) 50 MCG/ACT nasal spray Place 1 spray into both nostrils daily as needed for allergies. ALLERGIES    ipratropium (ATROVENT) 0.06 % nasal spray Place 2 sprays into both nostrils 4 (four) times daily. Qty: 15 mL, Refills: 12    vitamin C (ASCORBIC ACID) 500 MG tablet Take 500 mg by mouth every other day.       STOP taking these medications     fluvastatin XL (LESCOL XL) 80 MG 24 hr tablet      ibuprofen (ADVIL,MOTRIN) 200 MG tablet      metoprolol succinate (TOPROL-XL) 25 MG 24 hr tablet      MICARDIS 80 MG tablet            Outstanding Labs/Studies   BMET in 1 week  Duration of Discharge Encounter   Greater than 30 minutes including physician time.  Signed, Almyra Deforest PA-C 05/23/2015, 12:35 PM

## 2015-05-23 NOTE — Discharge Instructions (Signed)
No lifting over 5 lbs for 1 week. No sexual activity for 1 week. Keep procedure site clean & dry. If you notice increased pain, swelling, bleeding or pus, call/return!  You may shower, but no soaking baths/hot tubs/pools for 1 week.    Heart Failure prevention plan:  1. Avoid salt  2. Limit daily fluid intake to < 2L  3. Weigh yourself everyday and contact cardiology if weight increase by more than 3 lbs overnight or 5 lbs in a single week.

## 2015-05-23 NOTE — Progress Notes (Signed)
Attempted to put on the bed alarm @ change of shift, but patient refused. Low fall risk and stated she does not need it. Explained the importance of the bed alarm @ night, but still refused.

## 2015-05-23 NOTE — Progress Notes (Signed)
Subjective:  Feels better today and no complaints of shortness of breath or chest pain.  According to skills weight is down about 9 pounds.  Fairly good urine output.  Objective:  Vital Signs in the last 24 hours: BP 121/73 mmHg  Pulse 69  Temp(Src) 98.2 F (36.8 C) (Oral)  Resp 16  Ht 5\' 4"  (1.626 m)  Wt 110.542 kg (243 lb 11.2 oz)  BMI 41.81 kg/m2  SpO2 98%  Physical Exam:  Lungs:  Clear  Cardiac:  Regular rhythm, normal S1 and S2, no S3 Abdomen:  Soft, nontender, no masses Extremities:  No edema present  Intake/Output from previous day: 04/07 0701 - 04/08 0700 In: 720 [P.O.:720] Out: 2125 [Urine:2125] Weight Filed Weights   05/20/15 1728 05/22/15 1833 05/23/15 0638  Weight: 114.6 kg (252 lb 10.4 oz) 110.587 kg (243 lb 12.8 oz) 110.542 kg (243 lb 11.2 oz)    Lab Results: Basic Metabolic Panel:  Recent Labs  05/22/15 0953 05/23/15 0427  NA 141 138  K 3.8 4.1  CL 101 100*  CO2 29 28  GLUCOSE 140* 124*  BUN 12 13  CREATININE 0.91 0.76    CBC:  Recent Labs  05/20/15 1159 05/21/15 0355  WBC 9.7 9.7  HGB 12.9 13.7  HCT 39.3 41.8  MCV 91.8 91.5  PLT 292 317    BNP    Component Value Date/Time   BNP 214.8* 05/21/2015 0355   Telemetry: Normal sinus rhythm  Assessment/Plan:  1.  Nonischemic cardiomyopathy likely due to hypertension and obesity-left bundle branch block may also be contributing 2.  Acute on chronic systolic heart failure 3.  Obstructive sleep apnea 4.  Left bundle branch block  Recommendations:  The patient appears stable and I think could go home today.  Extensive discussion had about nature and treatment of congestive heart failure.  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  She will need to go home on optimal medical therapy and if her LV function does not improve could be considered for cardiac resynchronization.  Discussed importance of weight  loss with her.  She will need follow-up in one week on discharge.  Added hydralazine since she is African-American and nitrates can be added as outpatient if blood pressure allows.     Kerry Hough  MD [facility] Cardiology  05/23/2015, 9:21 AM

## 2015-05-27 NOTE — Progress Notes (Signed)
Cardiology Office Note   Date:  05/28/2015   ID:  [name], DOB 1956/01/13, MRN [id number]  PCP:  Odette Fraction, MD  Cardiologist:  Dr. Radford Pax    Chief Complaint  Patient presents with  . Hospitalization Follow-up    post SOB and cardiac cath.      History of Present Illness: [name] is a 60 y.o. 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  She has been diuresed.  She also had NSVT,. She was negative 7.6 L and wt at discharge she was 243 lbs. Down from 260 on admit.  Hydralazine was added.  She has a history of HTN and LBBB who last saw Dr. Radford Pax 12/2013 for abnormal stress test done for LBBB. She was completely asymptomatic and stress test showed a moderate defect in the inferior wall with very mild reversibility that was felt to be diaphragmatic and breast attenuation artifact. She was continued on medical therapy since she was asymptomatic. Of note her EF on nuclear stress test was 45%. 2D echo confirmed EF 45%. Also sleep apnea with Cpap.  Today pt is much improved.  No SOB no edema and wt continues to decrease. Her BP is stable but borderline.  We discussed diet and volume intake.  Also asked her to exercise start at 10 min on treadmill at easy pcae and slowly increase to 20 min a day.  No chest pain and no SOB.   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    Past Medical History  Diagnosis Date  . Hypertension   . Hyperlipidemia   . Cataract   . Anxiety   . Neuropathy ([place])   . Carpal tunnel syndrome   . Low back pain   . Breast cancer ([place])     left breast  . OSA on CPAP   . Left bundle branch block (LBBB)   . NICM (nonischemic cardiomyopathy) ([place])     EF 30%, cath 05/20/2015 clean coronary    Past Surgical History  Procedure Laterality Date  .  Cataract extraction w/ intraocular lens  implant, bilateral    . Lumpectomy left breast    . Dilation and curettage of uterus    . Eye surgery    . Cardiac catheterization N/A 05/20/2015    Procedure: Right/Left Heart Cath and Coronary Angiography;  Surgeon: Troy Sine, MD;  Location: [facility];  Service: Cardiovascular;  Laterality: N/A;     Current Outpatient Prescriptions  Medication Sig Dispense Refill  . aspirin 81 MG tablet Take 81 mg by mouth daily.    . carvedilol (COREG) 6.25 MG tablet Take 1 tablet (6.25 mg total) by mouth 2 (two) times daily with a meal. 180 tablet 3  . fluvastatin XL (LESCOL XL) 80 MG 24 hr tablet Take 80 mg by mouth daily.    . furosemide (LASIX) 40 MG tablet Take 1 tablet (40 mg total) by mouth daily. 90 tablet 1  . hydrALAZINE (APRESOLINE) 25 MG tablet Take 1 tablet (25 mg total) by mouth 3 (three) times daily. 90 tablet 11  . irbesartan (AVAPRO) 150 MG tablet Take 1 tablet (150 mg total) by mouth daily. 90 tablet 3  . spironolactone (ALDACTONE) 25 MG tablet Take 0.5 tablets (12.5 mg total) by mouth daily. 30 tablet  5  . vitamin C (ASCORBIC ACID) 500 MG tablet Take 500 mg by mouth every other day.      No current facility-administered medications for this visit.    Allergies:   Atorvastatin; Lipitor; Pravachol; Pravastatin; Pravastatin sodium; Sulfa antibiotics; and Sulfonamide derivatives    Social History:  The patient  reports that she quit smoking about 5 years ago. She has never used smokeless tobacco. She reports that she drinks alcohol. She reports that she does not use illicit drugs.   Family History:  The patient's family history includes Alcohol abuse in her father; Arthritis in her mother; Cancer in her father and paternal grandmother; Depression in her father and mother; Drug abuse in her brother; Early death in her maternal grandmother; Hearing loss in her mother; Hyperlipidemia in her father and mother; Hypertension in her father;  Miscarriages / [place] in her mother. There is no history of Heart attack.    ROS:  General:no colds or fevers, no weight changes Skin:no rashes or ulcers HEENT:no blurred vision, no congestion CV:see HPI PUL:see HPI GI:no diarrhea constipation or melena, no indigestion GU:no hematuria, no dysuria MS:no joint pain, no claudication Neuro:no syncope, no lightheadedness Endo:no diabetes, no thyroid disease Wt Readings from Last 3 Encounters:  05/28/15 241 lb 12.8 oz (109.68 kg)  05/23/15 243 lb 11.2 oz (110.542 kg)  05/20/15 260 lb (117.935 kg)     PHYSICAL EXAM: VS:  BP 102/68 mmHg  Pulse 81  Ht 5\' 4"  (1.626 m)  Wt 241 lb 12.8 oz (109.68 kg)  BMI 41.48 kg/m2  SpO2 95% , BMI Body mass index is 41.48 kg/(m^2). General:Pleasant affect, NAD Skin:Warm and dry, brisk capillary refill HEENT:normocephalic, sclera clear, mucus membranes moist Neck:supple, no JVD, no bruits  Heart:S1S2 RRR without murmur, gallup, rub or click Lungs:clear without rales, rhonchi, or wheezes [id number], non tender, + BS, do not palpate liver spleen or masses Ext:no lower ext edema, 2+ pedal pulses, 2+ radial pulses Neuro:alert and oriented, MAE, follows commands, + facial symmetry    EKG:  EKG is NOT ordered today.   Recent Labs: 02/17/2015: ALT 19; TSH 1.368 05/21/2015: B Natriuretic Peptide 214.8*; Hemoglobin 13.7; Magnesium 1.8; Platelets 317 05/23/2015: BUN 13; Creatinine, Ser 0.76; Potassium 4.1; Sodium 138    Lipid Panel    Component Value Date/Time   CHOL 202* 02/17/2015 0858   TRIG 88 02/17/2015 0858   HDL 64 02/17/2015 0858   CHOLHDL 3.2 02/17/2015 0858   VLDL 18 02/17/2015 0858   LDLCALC 120 02/17/2015 0858       Other studies Reviewed: Additional studies/ records that were reviewed today include: . ECHO: Study Conclusions  - Left ventricle: The cavity size was mildly dilated. Wall  thickness was normal. Systolic function was moderately to  severely reduced. The estimated  ejection fraction was in the  range of 30% to 35%. - Mitral valve: Calcified annulus. Mildly thickened leaflets . - Left atrium: The atrium was mildly dilated. - Right ventricle: RV is not well enough to evaluate function  Impressions:  - Poor acoustic windows limit study   Cardiac cath: Coronary Findings    Dominance: Right   Left Main  Vessel was injected. Vessel is normal in caliber. Vessel is angiographically normal.     Left Anterior Descending  Vessel was injected. Vessel is normal in caliber. Vessel is angiographically normal.     Left Circumflex  Vessel was injected. Vessel is normal in caliber. Vessel is angiographically normal.     Right Coronary  Artery  Vessel was injected. Vessel is normal in caliber. Vessel is angiographically normal.       Right Heart Pressures Hemodynamic findings consistent with severe pulmonary hypertension. RA: A-wave 21, V wave 20, mean 17 RV: 67/10/21 PA: 67/37, mean 48 PW: A-wave 37, V wave 43, mean 36  AO: 150/97 PA: 66/36; mean 50  LV: 153/24/44 PW: A-wave 42, V wave 49, mean 42  LV: 152/33/43 AO: 153/97  Cardiac output by the thermodilution method 4.0 L/m with a cardiac index of 1.8 L/m/m; by the Fick method 5.2 L/m with an index of 2.4 L/m/m.   Left Main  Vessel was injected. Vessel is normal in caliber. Vessel is angiographically normal.      Left Anterior Descending  Vessel was injected. Vessel is normal in caliber. Vessel is angiographically normal.     Left Circumflex  Vessel was injected. Vessel is normal in caliber. Vessel is angiographically normal.     Right Coronary Artery  Vessel was injected. Vessel is normal in caliber. Vessel is angiographically normal.         ASSESSMENT AND PLAN:  1.  Acute systolic heart failure (HCC) now improved with 20 lb wt loss of fluid.  euvolemic today --check BMP and continue lasix at 40 mg daily for now - begin exercising by walking - discussed low salt  diet and volume restriction -she weighs daily and will call if > 5 lbs in a week - will discuss return to work with Dr. Radford Pax.     2. NICM  EF 30-35%  3.  Mixed hyperlipidemia continue Lescol  4. OSA (obstructive sleep apnea) she uses cpap daily  5.  LBBB (left bundle branch block)  6.  Chronic combined systolic and diastolic CHF (congestive heart failure) (HCC) stable  7.  Pulmonary HTN ([place])  8.   NSVT (nonsustained ventricular tachycardia) (HCC) no complaints of lightheadedness   9.  Morbid obesity ([place]) Wt continues to decrease  10.  Hypertensive heart disease- controlled   BMP today  Follow up in 2 week with AP and in 2 months with Dr. Radford Pax.    Current medicines are reviewed with the patient today.  The patient Has no concerns regarding medicines.  The following changes have been made:  See above Labs/ tests ordered today include:see above  Disposition:   FU:  see above  Lennie Muckle, NP  05/28/2015 9:56 AM    [facility] [address] [place], [place], [place], [place] Phone: [phone]; Fax: [phone]

## 2015-05-28 ENCOUNTER — Encounter: Payer: Self-pay | Admitting: Cardiology

## 2015-05-28 ENCOUNTER — Ambulatory Visit (INDEPENDENT_AMBULATORY_CARE_PROVIDER_SITE_OTHER): Payer: Managed Care, Other (non HMO) | Admitting: Cardiology

## 2015-05-28 ENCOUNTER — Other Ambulatory Visit (INDEPENDENT_AMBULATORY_CARE_PROVIDER_SITE_OTHER): Payer: Managed Care, Other (non HMO) | Admitting: *Deleted

## 2015-05-28 VITALS — BP 102/68 | HR 81 | Ht 64.0 in | Wt 241.8 lb

## 2015-05-28 DIAGNOSIS — I5021 Acute systolic (congestive) heart failure: Secondary | ICD-10-CM

## 2015-05-28 DIAGNOSIS — E785 Hyperlipidemia, unspecified: Secondary | ICD-10-CM

## 2015-05-28 DIAGNOSIS — I429 Cardiomyopathy, unspecified: Secondary | ICD-10-CM

## 2015-05-28 DIAGNOSIS — I5032 Chronic diastolic (congestive) heart failure: Secondary | ICD-10-CM

## 2015-05-28 DIAGNOSIS — I447 Left bundle-branch block, unspecified: Secondary | ICD-10-CM

## 2015-05-28 DIAGNOSIS — I5043 Acute on chronic combined systolic (congestive) and diastolic (congestive) heart failure: Secondary | ICD-10-CM

## 2015-05-28 DIAGNOSIS — R0789 Other chest pain: Secondary | ICD-10-CM

## 2015-05-28 DIAGNOSIS — I10 Essential (primary) hypertension: Secondary | ICD-10-CM | POA: Diagnosis not present

## 2015-05-28 DIAGNOSIS — I428 Other cardiomyopathies: Secondary | ICD-10-CM

## 2015-05-28 LAB — BASIC METABOLIC PANEL
BUN: 15 mg/dL (ref 7–25)
CHLORIDE: 98 mmol/L (ref 98–110)
CO2: 24 mmol/L (ref 20–31)
CREATININE: 0.79 mg/dL (ref 0.50–0.99)
Calcium: 10.2 mg/dL (ref 8.6–10.4)
GLUCOSE: 104 mg/dL — AB (ref 65–99)
POTASSIUM: 4.2 mmol/L (ref 3.5–5.3)
Sodium: 137 mmol/L (ref 135–146)

## 2015-05-28 NOTE — Addendum Note (Signed)
Addended by: Eulis Foster on: 05/28/2015 10:38 AM   Modules accepted: Orders

## 2015-05-28 NOTE — Patient Instructions (Addendum)
Medication Instructions:   Your physician recommends that you continue on your current medications as directed. Please refer to the Current Medication list given to you today.   If you need a refill on your cardiac medications before your next appointment, please call your pharmacy.  Labwork:   BMET TODAY    Testing/Procedures:  NONE ORDER TODAY    Follow-Up:  IN 3 WEEKS WITH EXTENDER                       IN 2 MONTHS WITH DR TURNER    Any Other Special Instructions Will Be Listed Below (If Applicable).

## 2015-06-18 NOTE — Progress Notes (Signed)
Cardiology Office Note:    Date:  06/19/2015   ID:  [name], DOB Nov 07, 1955, MRN [id number]  PCP:  Odette Fraction, MD  Cardiologist:  Dr. Fransico Him   Electrophysiologist:  n/a  Referring MD: Joanna Frizzle, MD   Chief Complaint  Patient presents with  . Congestive Heart Failure    follow up    History of Present Illness:     [name] is a 60 y.o. female with a hx of DCM with EF 45%, HTN, LBBB, obesity.  Stress test in 2015 with inf and ant wall defects.  Patient was asymptomatic and treated medically.  Recently seen by Cecilie Kicks, NP and Dr. Cristopher Peru for chest pain. Admitted 4/5-4/8.  She underwent LHC which demonstrated normal coronary arteries.  She had evidence of volume overload and severe pulmonary HTN.  She was diuresed.  Echo demonstrated EF 30-35%.  She had NSVT noted on Tele.  DC weight 243.  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  Seen in FU by Cecilie Kicks, NP 05/28/15.    Returns for FU.   Here today with her friend. She had been doing well until the last week. She has noticed a 3 pound weight gain. She's also noticed a nervous feeling as well as increased dyspnea. She is NYHA 2b. Denies orthopnea, PND or edema. Denies syncope. Denies chest pain. She's had a number of cough. Also notes increased belching and gas.  She had many questions today about her condition, activity and diet. I tried to answer all of her questions today.     Past Medical History  Diagnosis Date  . Hypertension   . Hyperlipidemia   . Cataract   . Anxiety   . Neuropathy ([place])   . Carpal tunnel syndrome   . Low back pain   . Breast cancer ([place])     left breast  . OSA on CPAP   . Left bundle branch block (LBBB)   . NICM (nonischemic cardiomyopathy) ([place])     EF 30%, cath 05/20/2015 clean coronary    Past Surgical History  Procedure Laterality Date  . Cataract extraction w/ intraocular lens  implant, bilateral    .  Lumpectomy left breast    . Dilation and curettage of uterus    . Eye surgery    . Cardiac catheterization N/A 05/20/2015    Procedure: Right/Left Heart Cath and Coronary Angiography;  Surgeon: Troy Sine, MD;  Location: [facility];  Service: Cardiovascular;  Laterality: N/A;    Current Medications: Outpatient Prescriptions Prior to Visit  Medication Sig Dispense Refill  . aspirin 81 MG tablet Take 81 mg by mouth daily.    . carvedilol (COREG) 6.25 MG tablet Take 1 tablet (6.25 mg total) by mouth 2 (two) times daily with a meal. 180 tablet 3  . fluvastatin XL (LESCOL XL) 80 MG 24 hr tablet Take 80 mg by mouth daily.    . furosemide (LASIX) 40 MG tablet Take 1 tablet (40 mg total) by mouth daily. 90 tablet 1  . hydrALAZINE (APRESOLINE) 25 MG tablet Take 1 tablet (25 mg total) by mouth 3 (three) times daily. 90 tablet 11  . irbesartan (AVAPRO) 150 MG tablet Take 1 tablet (150 mg total) by mouth daily. 90 tablet 3  . spironolactone (ALDACTONE) 25 MG tablet Take 0.5 tablets (12.5 mg total) by mouth daily. 30 tablet 5  . vitamin C (ASCORBIC  ACID) 500 MG tablet Take 500 mg by mouth every other day.      No facility-administered medications prior to visit.      Allergies:   Atorvastatin; Lipitor; Pravachol; Pravastatin; Pravastatin sodium; Sulfa antibiotics; and Sulfonamide derivatives   Social History   Social History  . Marital Status: Significant Other    Spouse Name: N/A  . Number of Children: N/A  . Years of Education: N/A   Occupational History  . Truck Geophysicist/field seismologist    Social History Main Topics  . Smoking status: Former Smoker    Quit date: 06/18/2009  . Smokeless tobacco: Never Used  . Alcohol Use: 0.0 oz/week     Comment: occasional use  . Drug Use: No  . Sexual Activity: Yes   Other Topics Concern  . None   Social History Narrative     Family History:  The patient's family history includes Alcohol abuse in her father; Arthritis in her mother; Cancer in her  father and paternal grandmother; Depression in her father and mother; Drug abuse in her brother; Early death in her maternal grandmother; Hearing loss in her mother; Hyperlipidemia in her father and mother; Hypertension in her father; Miscarriages / [place] in her mother. There is no history of Heart attack.   ROS:   Please see the history of present illness.    Review of Systems  Constitution: Positive for decreased appetite and weight gain.  Cardiovascular: Positive for dyspnea on exertion and leg swelling.  Respiratory: Positive for cough.   Musculoskeletal: Positive for back pain.  Psychiatric/Behavioral: The patient is nervous/anxious.    All other systems reviewed and are negative.   Physical Exam:    VS:  BP 122/62 mmHg  Pulse 68  Ht 5\' 4"  (1.626 m)  Wt 245 lb 1.9 oz (111.186 kg)  BMI 42.05 kg/m2   GEN: Well nourished, well developed, in no acute distress HEENT: normal Neck: no JVD, no masses Cardiac: Normal S1/S2, RRR; no murmurs, rubs, or gallops, no edema;     Respiratory:  clear to auscultation bilaterally; no wheezing, rhonchi or rales GI: soft, nontender, nondistended MS: no deformity or atrophy Skin: warm and dry Neuro: No focal deficits  Psych: Alert and oriented x 3, normal affect  Wt Readings from Last 3 Encounters:  06/19/15 245 lb 1.9 oz (111.186 kg)  05/28/15 241 lb 12.8 oz (109.68 kg)  05/23/15 243 lb 11.2 oz (110.542 kg)      Studies/Labs Reviewed:     EKG:  EKG is  ordered today.  The ekg ordered today demonstrates NSR, HR 67, LBBB  Recent Labs: 02/17/2015: ALT 19; TSH 1.368 05/21/2015: B Natriuretic Peptide 214.8*; Hemoglobin 13.7; Magnesium 1.8; Platelets 317 05/28/2015: BUN 15; Creat 0.79; Potassium 4.2; Sodium 137   Recent Lipid Panel    Component Value Date/Time   CHOL 202* 02/17/2015 0858   TRIG 88 02/17/2015 0858   HDL 64 02/17/2015 0858   CHOLHDL 3.2 02/17/2015 0858   VLDL 18 02/17/2015 0858   LDLCALC 120 02/17/2015 0858     Additional studies/ records that were reviewed today include:   Echo 05/21/15 EF 30-35%, MAC, mild LAE  LHC 05/20/15 LM normal LAD normal LCx normal RCA normal RHC: severe Pulmonary HTN - PA 67/37, mean 48; PCWP mean 36  Myoview 6/14 Inf scar vs diaph atten, inf and apical inf ischemia and ant ischemia, EF 45%   ASSESSMENT:     1. Chronic systolic CHF (congestive heart failure) ([place])   2. NICM (  nonischemic cardiomyopathy) ([place])   3. OSA (obstructive sleep apnea)   4. Hypertensive heart disease with heart failure ([place])   5. Pulmonary HTN ([place])     PLAN:     In order of problems listed above:  1. Chronic Systolic CHF - NYHA 2b. She's had a 3 pound weight gain and is symptomatic. I have asked her to increase her Lasix to 40 mg twice a day 3 days. She will then resume her usual dose. She knows to notify as if her symptoms do not improve with extra diuresis.  2. NICM - Continue Beta blocker, hydralazine, ARB, spironolactone.  She will need a follow-up echocardiogram at some point in the next 2 months. If EF remains < 35%, refer to EP for CRT +/- ICD.  Of note, I completed disability paperwork today.  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 I have changed her disability forms to allow her to remain out of work until she is seen back in follow-up in June with Dr. Radford Pax. We can make further determinations at that time if needed.  3. OSA - Continue CPAP.   4. HTN - Controlled.   5. Pulmonary HTN - 2/2 to CHF. Will recheck at time of her FU Echo.    Medication Adjustments/Labs and Tests Ordered: Current medicines are reviewed at length with the patient today.  Concerns regarding medicines are outlined above.  Medication changes, Labs and Tests ordered today are outlined in the Patient Instructions noted below. Patient Instructions  Medication Instructions:  1. INCREASE LASIX TO 40 MG TWICE  DAILY FOR 3 DAYS; AFTER THE 3 DAYS YOU WILL RESUME LASIX 40 MG DAILY. Labwork: NONE Testing/Procedures: NONE Follow-Up: KEEP YOUR UPCOMING APPT WITH DR. Radford Pax 07/28/2015 Any Other Special Instructions Will Be Listed Below (If Applicable). If you need a refill on your cardiac medications before your next appointment, please call your pharmacy.    Signed, [name], PA-C  06/19/2015 12:09 PM    [facility] [address], [place], [place]  [address] Phone: [phone]; Fax: [phone]

## 2015-06-19 ENCOUNTER — Encounter: Payer: Self-pay | Admitting: Physician Assistant

## 2015-06-19 ENCOUNTER — Ambulatory Visit (INDEPENDENT_AMBULATORY_CARE_PROVIDER_SITE_OTHER): Payer: Managed Care, Other (non HMO) | Admitting: Physician Assistant

## 2015-06-19 VITALS — BP 122/62 | HR 68 | Ht 64.0 in | Wt 245.1 lb

## 2015-06-19 DIAGNOSIS — I5022 Chronic systolic (congestive) heart failure: Secondary | ICD-10-CM | POA: Diagnosis not present

## 2015-06-19 DIAGNOSIS — I110 Hypertensive heart disease with heart failure: Secondary | ICD-10-CM | POA: Diagnosis not present

## 2015-06-19 DIAGNOSIS — I428 Other cardiomyopathies: Secondary | ICD-10-CM

## 2015-06-19 DIAGNOSIS — I429 Cardiomyopathy, unspecified: Secondary | ICD-10-CM | POA: Diagnosis not present

## 2015-06-19 DIAGNOSIS — I272 Other secondary pulmonary hypertension: Secondary | ICD-10-CM

## 2015-06-19 DIAGNOSIS — G4733 Obstructive sleep apnea (adult) (pediatric): Secondary | ICD-10-CM | POA: Diagnosis not present

## 2015-06-19 NOTE — Patient Instructions (Addendum)
Medication Instructions:  1. INCREASE LASIX TO 40 MG TWICE DAILY FOR 3 DAYS; AFTER THE 3 DAYS YOU WILL RESUME LASIX 40 MG DAILY. Labwork: NONE Testing/Procedures: NONE Follow-Up: KEEP YOUR UPCOMING APPT WITH DR. Radford Pax 07/28/2015 Any Other Special Instructions Will Be Listed Below (If Applicable). If you need a refill on your cardiac medications before your next appointment, please call your pharmacy.

## 2015-06-24 ENCOUNTER — Telehealth: Payer: Self-pay | Admitting: Cardiology

## 2015-06-24 NOTE — Telephone Encounter (Signed)
New message   1. Are you calling in reference to your FMLA or disability form? Yes    2. What is your question in regards to FMLA or disability form? Yes    3. Do you need copies of your medical records? No    Are you waiting on a nurse to call you back with results or are you wanting copies of your results?  Patient verbalize if the nurse or someone to call her back - she is very concerned  -Back / forth with medical record - what's gonna to happen to her medical record.   Her job is on the line . She verbalize she's reaching out for help - what's gonna to happen to her document.

## 2015-07-28 ENCOUNTER — Ambulatory Visit (INDEPENDENT_AMBULATORY_CARE_PROVIDER_SITE_OTHER): Payer: Managed Care, Other (non HMO) | Admitting: Cardiology

## 2015-07-28 ENCOUNTER — Telehealth: Payer: Self-pay

## 2015-07-28 ENCOUNTER — Encounter: Payer: Self-pay | Admitting: Cardiology

## 2015-07-28 DIAGNOSIS — I5042 Chronic combined systolic (congestive) and diastolic (congestive) heart failure: Secondary | ICD-10-CM

## 2015-07-28 DIAGNOSIS — I429 Cardiomyopathy, unspecified: Secondary | ICD-10-CM | POA: Diagnosis not present

## 2015-07-28 DIAGNOSIS — I4729 Other ventricular tachycardia: Secondary | ICD-10-CM

## 2015-07-28 DIAGNOSIS — I472 Ventricular tachycardia: Secondary | ICD-10-CM

## 2015-07-28 DIAGNOSIS — I428 Other cardiomyopathies: Secondary | ICD-10-CM

## 2015-07-28 DIAGNOSIS — R002 Palpitations: Secondary | ICD-10-CM | POA: Insufficient documentation

## 2015-07-28 DIAGNOSIS — G4733 Obstructive sleep apnea (adult) (pediatric): Secondary | ICD-10-CM

## 2015-07-28 DIAGNOSIS — I272 Other secondary pulmonary hypertension: Secondary | ICD-10-CM

## 2015-07-28 LAB — BASIC METABOLIC PANEL
BUN: 27 mg/dL — ABNORMAL HIGH (ref 7–25)
CALCIUM: 10.1 mg/dL (ref 8.6–10.4)
CO2: 24 mmol/L (ref 20–31)
CREATININE: 1.03 mg/dL — AB (ref 0.50–0.99)
Chloride: 103 mmol/L (ref 98–110)
GLUCOSE: 102 mg/dL — AB (ref 65–99)
Potassium: 3.9 mmol/L (ref 3.5–5.3)
SODIUM: 136 mmol/L (ref 135–146)

## 2015-07-28 MED ORDER — SACUBITRIL-VALSARTAN 24-26 MG PO TABS: 1.0000 | ORAL_TABLET | Freq: Two times a day (BID) | ORAL | Status: DC

## 2015-07-28 NOTE — Patient Instructions (Addendum)
Medication Instructions:  1) STOP AVAPRO 2) START ENTRESTO 24/26 mg TWO TIMES DAILY  Labwork: TODAY: BMET  Testing/Procedures: Your physician has requested that you have an echocardiogram. Echocardiography is a painless test that uses sound waves to create images of your heart. It provides your doctor with information about the size and shape of your heart and how well your heart's chambers and valves are working. This procedure takes approximately one hour. There are no restrictions for this procedure.  Your physician has recommended that you wear an event monitor. Event monitors are medical devices that record the heart's electrical activity. Doctors most often [place] these monitors to diagnose arrhythmias. Arrhythmias are problems with the speed or rhythm of the heartbeat. The monitor is a small, portable device. You can wear one while you do your normal daily activities. This is usually used to diagnose what is causing palpitations/syncope (passing out).  Follow-Up: Your physician recommends that you schedule a follow-up appointment in 2 WEEKS in the [facility] Clinic.  Your physician recommends that you schedule a follow-up appointment in 3 MONTHS with Dr. Radford Pax.  Any Other Special Instructions Will Be Listed Below (If Applicable).     If you need a refill on your cardiac medications before your next appointment, please call your pharmacy.

## 2015-07-28 NOTE — Telephone Encounter (Signed)
Prior auth for [employer] 24-26 submitted to [facility].

## 2015-07-28 NOTE — Progress Notes (Signed)
Cardiology Office Note    Date:  07/28/2015   ID:  [name], DOB 1955-06-18, MRN [id number]  PCP:  Odette Fraction, MD  Cardiologist:  Fransico Him, MD   No chief complaint on file.   History of Present Illness:  [name] is a 60 y.o. female with a hx of DCM with EF 45%, HTN, LBBB, obesity. Stress test in 2015 with inf and ant wall defects and normal coronary arteries by cath EF 30-35% and severe pulmonary HTN (PASP 67/33mmHg) and NSVT noted on Tele at time of hospitalization for cath. DC weight 243. 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 She recently saw [name], PA for followup and had gained weight and was complaining of DOE.  Her Lasix was increased for a few days and helped.  She is doing well today.  Her weight at home as been around 236lbs.  She is NYHA 2b. She denies any chest pain and says that her breathing is doing well.  She has chronic LLE edema which is stable. She is also on CPAP therapy and doing well with the device. She uses a nasal pillow mask with no chin strap and tolerates it well. She does not snore. She feels the pressure is adequate enough. She feels rested in the am but has some daytime sleepiness.  She is also complaining that she has noticed palpitations that cause her to have a "sinking" feeling in her chest.  This occurs a few times weekly.  It is not associated with dizziness and lasts for about 30 minutes and resolves.      Past Medical History  Diagnosis Date  . Hypertension   . Hyperlipidemia   . Cataract   . Anxiety   . Neuropathy ([place])   . Carpal tunnel syndrome   . Low back pain   . Breast cancer ([place])     left breast  . OSA on CPAP   . Left bundle branch block (LBBB)   . NICM (nonischemic cardiomyopathy) ([place])     EF 30%, cath 05/20/2015 clean coronary    Past Surgical History  Procedure Laterality Date  . Cataract extraction w/ intraocular lens  implant, bilateral      . Lumpectomy left breast    . Dilation and curettage of uterus    . Eye surgery    . Cardiac catheterization N/A 05/20/2015    Procedure: Right/Left Heart Cath and Coronary Angiography;  Surgeon: Troy Sine, MD;  Location: [facility];  Service: Cardiovascular;  Laterality: N/A;    Current Medications: Outpatient Prescriptions Prior to Visit  Medication Sig Dispense Refill  . aspirin 81 MG tablet Take 81 mg by mouth daily.    . carvedilol (COREG) 6.25 MG tablet Take 1 tablet (6.25 mg total) by mouth 2 (two) times daily with a meal. 180 tablet 3  . fluvastatin XL (LESCOL XL) 80 MG 24 hr tablet Take 80 mg by mouth daily.    . furosemide (LASIX) 40 MG tablet Take 1 tablet (40 mg total) by mouth daily. 90 tablet 1  . hydrALAZINE (APRESOLINE) 25 MG tablet Take 1 tablet (25 mg total) by mouth 3 (three) times daily. 90 tablet 11  . irbesartan (AVAPRO) 150 MG tablet Take 1 tablet (150 mg total) by mouth daily. 90 tablet 3  . spironolactone (ALDACTONE) 25 MG tablet Take 0.5 tablets (12.5 mg total) by mouth daily. 30 tablet 5  .  vitamin C (ASCORBIC ACID) 500 MG tablet Take 500 mg by mouth as needed.      No facility-administered medications prior to visit.     Allergies:   Atorvastatin; Lipitor; Pravachol; Pravastatin; Pravastatin sodium; Sulfa antibiotics; and Sulfonamide derivatives   Social History   Social History  . Marital Status: Significant Other    Spouse Name: N/A  . Number of Children: N/A  . Years of Education: N/A   Occupational History  . Truck Geophysicist/field seismologist    Social History Main Topics  . Smoking status: Former Smoker    Quit date: 06/18/2009  . Smokeless tobacco: Never Used  . Alcohol Use: 0.0 oz/week     Comment: occasional use  . Drug Use: No  . Sexual Activity: Yes   Other Topics Concern  . None   Social History Narrative     Family History:  The patient's family history includes Alcohol abuse in her father; Arthritis in her mother; Cancer in her father  and paternal grandmother; Depression in her father and mother; Drug abuse in her brother; Early death in her maternal grandmother; Hearing loss in her mother; Hyperlipidemia in her father and mother; Hypertension in her father; Miscarriages / [place] in her mother. There is no history of Heart attack.   ROS:   Please see the history of present illness.    Review of Systems  HENT: Positive for headaches.   Respiratory: Positive for cough.   Musculoskeletal: Positive for muscle cramps.   All other systems reviewed and are negative.   PHYSICAL EXAM:   VS:  BP 124/66 mmHg  Pulse 68  Ht 5\' 4"  (1.626 m)  Wt 239 lb (108.41 kg)  BMI 41.00 kg/m2   GEN: Well nourished, well developed, in no acute distress HEENT: normal Neck: no JVD, carotid bruits, or masses Cardiac: RRR; no murmurs, rubs, or gallops,no edema.  Intact distal pulses bilaterally.  Respiratory:  clear to auscultation bilaterally, normal work of breathing GI: soft, nontender, nondistended, + BS MS: no deformity or atrophy Skin: warm and dry, no rash Neuro:  Alert and Oriented x 3, Strength and sensation are intact Psych: euthymic mood, full affect  Wt Readings from Last 3 Encounters:  07/28/15 239 lb (108.41 kg)  06/19/15 245 lb 1.9 oz (111.186 kg)  05/28/15 241 lb 12.8 oz (109.68 kg)      Studies/Labs Reviewed:   EKG:  EKG is not  ordered today.   Recent Labs: 02/17/2015: ALT 19; TSH 1.368 05/21/2015: B Natriuretic Peptide 214.8*; Hemoglobin 13.7; Magnesium 1.8; Platelets 317 05/28/2015: BUN 15; Creat 0.79; Potassium 4.2; Sodium 137   Lipid Panel    Component Value Date/Time   CHOL 202* 02/17/2015 0858   TRIG 88 02/17/2015 0858   HDL 64 02/17/2015 0858   CHOLHDL 3.2 02/17/2015 0858   VLDL 18 02/17/2015 0858   LDLCALC 120 02/17/2015 0858    Additional studies/ records that were reviewed today include:  none    ASSESSMENT:    1. Nonischemic cardiomyopathy ([place])   2. Chronic combined systolic and  diastolic CHF (congestive heart failure) ([place])   3. OSA (obstructive sleep apnea)   4. Morbid obesity, unspecified obesity type ([place])   5. Pulmonary HTN ([place])   6. NSVT (nonsustained ventricular tachycardia) ([facility])   7. Heart palpitations      PLAN:  In order of problems listed above:  1. Nonischemic DCM EF 35-35% at time of cath which showed normal coronary arteries.  Continue Beta blocker, hydralazine, ARB, spironolactone.I  am going to change her from ARB to Entresto 24/26mg  BID and followup with clinical pharmacologist in 2 weeks for up titration.  We will get a follow-up echocardiogram to reassess LVF. 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  Her disability papers were completed.  For now she will need to continue on disability as she should not be driving due to DCM with recent palpitations.  Will reassess this once we have the data on her repeat EF and heart monitor.   2.  Chronic combined systolic/diastolic CHF - appears euvolemic on exam.  She is below her dry weight and has clear lungs and no edema in LEs.  Her SOB is stable.  She is NYHA Class 2.  Continue BB/Hydrlazine and aldactone and change ARB to Entresto and titrate.  Check BMET.  2. OSA - she is tolerating her CPAP well.  Patient has been using and benefiting from CPAP use and will continue to benefit from therapy. I will get a download from her DME.  3.  Morbid obesity - I have encouraged her to continue walking on her treadmill for exercise.  4.  Severe Pulmonary HTN by cath - I suspect that this is secondary to pulmonary venous hypertension from LV dysfunction- this will be rechecked at echo.  5.   NSVT - she is having palpitations so I will get an event monitor to make sure she is not have recurrent VT or PAF.    6.  Heart palpitations - as above  - will get heart monitor to make sure she is not having NSVT or PAF.  I have spent a total of 45 minutes with patient reviewing at length her CHF diagnosis and prognosis ,  EKGs, lcath and echo reports and labs and examining patient as well as establishing an assessment and plan that was discussed with the patient.  > 50% of time was spent in direct patient care.    Medication Adjustments/Labs and Tests Ordered: Current medicines are reviewed at length with the patient today.  Concerns regarding medicines are outlined above.  Medication changes, Labs and Tests ordered today are listed in the Patient Instructions below.  There are no Patient Instructions on file for this visit.   Signed, Fransico Him, MD  07/28/2015 10:53 AM    [address], [place], [place]  [address] Phone: [phone]; Fax: [phone]

## 2015-07-29 ENCOUNTER — Ambulatory Visit (INDEPENDENT_AMBULATORY_CARE_PROVIDER_SITE_OTHER): Payer: Managed Care, Other (non HMO)

## 2015-07-29 DIAGNOSIS — R002 Palpitations: Secondary | ICD-10-CM

## 2015-07-30 ENCOUNTER — Telehealth: Payer: Self-pay

## 2015-07-30 NOTE — Telephone Encounter (Signed)
Entresto approved by [place], good through 07/27/2016. Request [id number].

## 2015-08-11 ENCOUNTER — Ambulatory Visit (INDEPENDENT_AMBULATORY_CARE_PROVIDER_SITE_OTHER): Payer: Managed Care, Other (non HMO) | Admitting: Pharmacist

## 2015-08-11 VITALS — BP 112/80 | HR 65 | Wt 241.0 lb

## 2015-08-11 DIAGNOSIS — I5042 Chronic combined systolic (congestive) and diastolic (congestive) heart failure: Secondary | ICD-10-CM | POA: Diagnosis not present

## 2015-08-11 LAB — BASIC METABOLIC PANEL
BUN: 15 mg/dL (ref 7–25)
CHLORIDE: 103 mmol/L (ref 98–110)
CO2: 24 mmol/L (ref 20–31)
Calcium: 9.5 mg/dL (ref 8.6–10.4)
Creat: 0.87 mg/dL (ref 0.50–0.99)
Glucose, Bld: 109 mg/dL — ABNORMAL HIGH (ref 65–99)
POTASSIUM: 4.3 mmol/L (ref 3.5–5.3)
SODIUM: 139 mmol/L (ref 135–146)

## 2015-08-11 NOTE — Patient Instructions (Signed)
We will stop your hydralazine.  This way hopefully we can increase your Entresto dose at your next visit.   If the top number of your blood pressure gets above 140 more than 2 days in a row, please call Gay Filler at [phone].   Continue to weigh yourself today.  If your weight goes up more than 2 lbs in 2 days, you can take an extra furosemide.

## 2015-08-11 NOTE — Progress Notes (Signed)
Patient ID: [name]                 DOB: 29-Dec-1955                      MRN: [id number]     HPI: [name] is a 60 y.o. female referred by Dr. Radford Pax to the pharmacy clinic for follow up after starting Entresto.  She has a hx of DCM with EF 30-35%, HTN, LBBB, obesity. She was seen by [name], PA on 06/19/15 for followup and had gained weight and was complaining of DOE.  Her Lasix was increased for a few days and helped.  At her follow up with Dr. Radford Pax on 07/28/15, she was doing well.  She was on beta-blocker, ARB, spironolactone, and hydralazine.  Dr. Radford Pax changed her ARB to [facility] 24/26mg .  She is here today for BP follow up and dose titration.    Current HTN meds: carvedilol 6.25mg  BID, furosemide 40mg  daily, hydralazine 25mg  TID, Entresto 24/26mg  BID  BP goal: <140/90  Social History: Pt will have an occasional alcohol drink  Diet: Pt has been keeping a strict 1500 mg sodium diet.  She is cooking a lot of her foods.  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   Exercise: Pt has been walking on her treadmill 10-20 minutes a few days of the week.  She is not able to do this on a consistent basis because she is too tired the next day.   Home BP readings: Pt checks her BP at random times at home.  She did not bring her cuff or readings with her today.  She states they will be in the 130s before she takes her medications and get as low as 100 during the day.   Wt Readings from Last 3 Encounters:  07/28/15 239 lb (108.41 kg)  06/19/15 245 lb 1.9 oz (111.186 kg)  05/28/15 241 lb 12.8 oz (109.68 kg)   BP Readings from Last 3 Encounters:  07/28/15 124/66  06/19/15 122/62  05/28/15 102/68   Pulse Readings from Last 3 Encounters:  07/28/15 68  06/19/15 68  05/28/15 81    Renal function: Estimated Creatinine Clearance: 69.9 mL/min (by C-G formula based on Cr of 1.03).  Past Medical History  Diagnosis Date  .  Hypertension   . Hyperlipidemia   . Cataract   . Anxiety   . Neuropathy ([place])   . Carpal tunnel syndrome   . Low back pain   . Breast cancer ([place])     left breast  . OSA on CPAP   . Left bundle branch block (LBBB)   . NICM (nonischemic cardiomyopathy) ([place])     EF 30%, cath 05/20/2015 clean coronary  . Chronic combined systolic (congestive) and diastolic (congestive) heart failure ([facility])     NYHA class II  . Pulmonary HTN ([place]) 05/2015    Severe with PASP 67/87mmHg    Current Outpatient Prescriptions on File Prior to Visit  Medication Sig Dispense Refill  . aspirin 81 MG tablet Take 81 mg by mouth daily.    . carvedilol (COREG) 6.25 MG tablet Take 1 tablet (6.25 mg total) by mouth 2 (two) times daily with a meal. 180 tablet 3  . fluvastatin XL (LESCOL XL) 80 MG 24 hr tablet Take 80 mg by mouth daily.    . furosemide (LASIX) 40 MG tablet Take  1 tablet (40 mg total) by mouth daily. 90 tablet 1  . hydrALAZINE (APRESOLINE) 25 MG tablet Take 1 tablet (25 mg total) by mouth 3 (three) times daily. 90 tablet 11  . sacubitril-valsartan (ENTRESTO) 24-26 MG Take 1 tablet by mouth 2 (two) times daily. 60 tablet 11  . spironolactone (ALDACTONE) 25 MG tablet Take 0.5 tablets (12.5 mg total) by mouth daily. 30 tablet 5  . vitamin C (ASCORBIC ACID) 500 MG tablet Take 500 mg by mouth as needed.      No current facility-administered medications on file prior to visit.    Allergies  Allergen Reactions  . Atorvastatin     REACTION: mylagias  . Lipitor [Atorvastatin Calcium] Other (See Comments)    myalgia  . Pravachol Other (See Comments)    myalgia  . Pravastatin     REACTION: myalgias  . Pravastatin Sodium     REACTION: myalgias  . Sulfa Antibiotics Hives  . Sulfonamide Derivatives     REACTION: hives     Assessment/Plan:  1.  CHF- Pt currently taking guideline recommended therapy of beta-blocker, ARB/sacubitril, and spironolactone.  Unable to up titrate carvedilol due to HR.  Would  like to increase Entresto dose but her BP is on the lower side.  She was placed on hydralazine but given the lack of outcome data with hydralazine monotherapy versus Entresto, would prefer to stop hydralazine to increase Entresto.  She just got her prescription for Entresto last week so will wait until she finishes this bottle.  Will go ahead and hold her hydralazine to see if her BP can tolerate higher dose.  Plan to follow up in 2 weeks.  Check BMET today with start of Entresto.

## 2015-08-14 ENCOUNTER — Other Ambulatory Visit (HOSPITAL_COMMUNITY): Payer: Managed Care, Other (non HMO)

## 2015-08-27 ENCOUNTER — Other Ambulatory Visit (HOSPITAL_COMMUNITY): Payer: Managed Care, Other (non HMO)

## 2015-08-28 ENCOUNTER — Other Ambulatory Visit: Payer: Self-pay

## 2015-08-28 ENCOUNTER — Ambulatory Visit (HOSPITAL_COMMUNITY): Payer: Managed Care, Other (non HMO) | Attending: Cardiology

## 2015-08-28 ENCOUNTER — Ambulatory Visit (INDEPENDENT_AMBULATORY_CARE_PROVIDER_SITE_OTHER): Payer: Managed Care, Other (non HMO) | Admitting: Pharmacist

## 2015-08-28 VITALS — BP 124/78 | HR 67 | Wt 240.0 lb

## 2015-08-28 DIAGNOSIS — E785 Hyperlipidemia, unspecified: Secondary | ICD-10-CM | POA: Insufficient documentation

## 2015-08-28 DIAGNOSIS — I340 Nonrheumatic mitral (valve) insufficiency: Secondary | ICD-10-CM | POA: Insufficient documentation

## 2015-08-28 DIAGNOSIS — I5042 Chronic combined systolic (congestive) and diastolic (congestive) heart failure: Secondary | ICD-10-CM

## 2015-08-28 DIAGNOSIS — G4733 Obstructive sleep apnea (adult) (pediatric): Secondary | ICD-10-CM | POA: Diagnosis not present

## 2015-08-28 DIAGNOSIS — I110 Hypertensive heart disease with heart failure: Secondary | ICD-10-CM | POA: Diagnosis not present

## 2015-08-28 DIAGNOSIS — I428 Other cardiomyopathies: Secondary | ICD-10-CM | POA: Insufficient documentation

## 2015-08-28 DIAGNOSIS — I447 Left bundle-branch block, unspecified: Secondary | ICD-10-CM | POA: Diagnosis not present

## 2015-08-28 DIAGNOSIS — I429 Cardiomyopathy, unspecified: Secondary | ICD-10-CM | POA: Diagnosis not present

## 2015-08-28 DIAGNOSIS — I071 Rheumatic tricuspid insufficiency: Secondary | ICD-10-CM | POA: Diagnosis not present

## 2015-08-28 DIAGNOSIS — I350 Nonrheumatic aortic (valve) stenosis: Secondary | ICD-10-CM | POA: Diagnosis not present

## 2015-08-28 MED ORDER — FUROSEMIDE 40 MG PO TABS: ORAL_TABLET | ORAL | Status: DC

## 2015-08-28 NOTE — Progress Notes (Signed)
Patient ID: [name]                 DOB: 26-Apr-1955                      MRN: [id number]     HPI: [name] is a 60 y.o. female referred by Dr. Radford Pax to the pharmacy clinic for follow up after starting Entresto.  She has a hx of DCM with EF 30-35%, HTN, LBBB, obesity. She was seen by [name], PA on 06/19/15 for followup and had gained weight and was complaining of DOE.  Her Lasix was increased for a few days and helped.  At her follow up with Dr. Radford Pax on 07/28/15, she was doing well.  She was on beta-blocker, ARB, spironolactone, and hydralazine.  Dr. Radford Pax changed her ARB to [facility] 24/26mg .  She was seen by myself on 08/11/15.  Her BP was at goal but unable to [employer].  We stopped her hydralazine to help increase BP and allow for higher dose of Entresto.  She is here today for follow up.   Pt has been doing well since last visit.  Her main complaint is fluctuation in her weight.  She has been going from 235-238lbs at home.  She is taking her furosemide once daily.  No SOB on exertion, orthopnea noted.  Minimal lower extremity swelling today.  Current HTN meds: carvedilol 6.25mg  BID, furosemide 40mg  daily, Entresto 24/26mg  BID  BP goal: <140/90  Social History: Pt will have an occasional alcohol drink  Diet: Pt has been keeping a strict 1500 mg sodium diet.  She is cooking a lot of her foods.  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   Exercise: Pt has been walking on her treadmill 10-20 minutes a few days of the week.  She is not able to do this on a consistent basis because she is too tired the next day.   Home BP readings: Pt checks her BP at home each day.  Average systolic [id number].  HR in the 60s-70s.   Wt Readings from Last 3 Encounters:  08/28/15 240 lb (108.863 kg)  08/11/15 241 lb (109.317 kg)  07/28/15 239 lb (108.41 kg)   BP Readings from Last 3 Encounters:  08/28/15 124/78  08/11/15 112/80    07/28/15 124/66   Pulse Readings from Last 3 Encounters:  08/28/15 67  08/11/15 65  07/28/15 68    Renal function: Estimated Creatinine Clearance: 82.9 mL/min (by C-G formula based on Cr of 0.87).  Past Medical History  Diagnosis Date  . Hypertension   . Hyperlipidemia   . Cataract   . Anxiety   . Neuropathy ([place])   . Carpal tunnel syndrome   . Low back pain   . Breast cancer ([place])     left breast  . OSA on CPAP   . Left bundle branch block (LBBB)   . NICM (nonischemic cardiomyopathy) ([place])     EF 30%, cath 05/20/2015 clean coronary  . Chronic combined systolic (congestive) and diastolic (congestive) heart failure ([facility])     NYHA class II  . Pulmonary HTN ([place]) 05/2015    Severe with PASP 67/89mmHg    Current Outpatient Prescriptions on File Prior to Visit  Medication Sig Dispense Refill  . aspirin 81 MG tablet Take 81 mg by mouth daily.    . carvedilol (COREG) 6.25 MG tablet  Take 1 tablet (6.25 mg total) by mouth 2 (two) times daily with a meal. 180 tablet 3  . fluvastatin XL (LESCOL XL) 80 MG 24 hr tablet Take 80 mg by mouth daily.    . hydrALAZINE (APRESOLINE) 25 MG tablet Take 1 tablet (25 mg total) by mouth 3 (three) times daily. 90 tablet 11  . sacubitril-valsartan (ENTRESTO) 24-26 MG Take 1 tablet by mouth 2 (two) times daily. 60 tablet 11  . spironolactone (ALDACTONE) 25 MG tablet Take 0.5 tablets (12.5 mg total) by mouth daily. 30 tablet 5  . vitamin C (ASCORBIC ACID) 500 MG tablet Take 500 mg by mouth as needed.      No current facility-administered medications on file prior to visit.    Allergies  Allergen Reactions  . Atorvastatin     REACTION: mylagias  . Lipitor [Atorvastatin Calcium] Other (See Comments)    myalgia  . Pravachol Other (See Comments)    myalgia  . Pravastatin     REACTION: myalgias  . Pravastatin Sodium     REACTION: myalgias  . Sulfa Antibiotics Hives  . Sulfonamide Derivatives     REACTION: hives     Assessment/Plan:  1.   CHF- Pt currently taking guideline recommended therapy of beta-blocker, ARB/sacubitril, and spironolactone.  Unable to up titrate carvedilol due to HR.  Would like to increase Entresto dose but her BP is on the lower side.  Her weight is fluctuating a few lbs within 1-2 days at home.  Advised patient that she can take an extra lasix if her weight increases 2lbs or more in a day.  If she has to take the extra furosemide more than a few days of the week, she will need to call the office and recheck a BMET.  Plan to follow up with Dr. Radford Pax in 2 months.

## 2015-09-01 ENCOUNTER — Telehealth: Payer: Self-pay | Admitting: Cardiology

## 2015-09-01 DIAGNOSIS — I5042 Chronic combined systolic (congestive) and diastolic (congestive) heart failure: Secondary | ICD-10-CM

## 2015-09-01 NOTE — Telephone Encounter (Signed)
New message     The pt is wanting the results of the Echo

## 2015-09-01 NOTE — Telephone Encounter (Signed)
Informed patient that clarification needs to be received from Dr. Radford Pax about her ECHO and she will be called tomorrow with results.  She also states she needs a letter from Dr. Radford Pax describing why she needs to be out on disability through September.  She currently has no symptoms.

## 2015-09-02 NOTE — Telephone Encounter (Signed)
Informed patient of results and verbal understanding expressed.   Repeat ECHO ordered to be scheduled in 1 year. Patient agrees with treatment plan.   Patient states she needs a letter from Dr. Radford Pax with a date included for when she may return to work.

## 2015-09-02 NOTE — Telephone Encounter (Signed)
Her Cardiomyopathy has resolved and EF is now normal so she does not need an AICD and she also can go back to work. The echo showed ? Bicuspid valve because her AV was not well visualized and is most likely trileaflet

## 2015-09-02 NOTE — Telephone Encounter (Signed)
-----   Message from Sueanne Margarita, MD sent at 08/29/2015  7:11 PM EDT ----- Echo showed normal LVF with bicuspid AV, mild AS, mild MR - repeat echo in 1 year

## 2015-09-04 ENCOUNTER — Telehealth: Payer: Self-pay | Admitting: Cardiology

## 2015-09-04 ENCOUNTER — Encounter: Payer: Self-pay | Admitting: *Deleted

## 2015-09-04 NOTE — Telephone Encounter (Signed)
Spoke with nurse covering Dr. Radford Pax this am to address patient needs documented actual date that she can return to work.

## 2015-09-04 NOTE — Telephone Encounter (Signed)
Informed patient letter of clerance to return to work with no restrictions left at front desk for her to pick up. Also left copy of Dr. Theodosia Blender office visit, echocardiogram, event monitor and BMET result - per pt request. She apologizes for being rude to staff and thanks [place] for helping.

## 2015-09-04 NOTE — Telephone Encounter (Signed)
New message      The pt is calling to get her paperwork, the recommendations and doctors note for the pt to go back to work   The pt is up-set and can not wait much longer, to return to work the pt states she talked with Dr. Theodosia Blender the other day.   The pt needs copies of her test, the written recommendations of Dr. Radford Pax and a doctor's note stating when she can return to work, the pt states she can return to work anytime she was told by the MD there was nothing wrong with her heart.  The pt claming she's called for serveal days and has not heard from anyone, she can not wait on Dr. Radford Pax to return from vacation or anything she needs the notes and copies today cause she needs to go back to work, please

## 2015-09-04 NOTE — Telephone Encounter (Signed)
I called the patient and informed that when Dr. Radford Pax verifies in writing that patient can return to work we will call her and she will pick up the documentation in person in the office.

## 2015-09-09 ENCOUNTER — Telehealth (HOSPITAL_COMMUNITY): Payer: Self-pay | Admitting: Cardiology

## 2015-09-09 NOTE — Telephone Encounter (Signed)
New message:  Pt voiced that since she has been on the cardiac medications that Dr. Radford Pax prescribed she has been experiencing some sneezing, itching and running eyes. She would like to know what meds she can take that will not interact with her other medications.

## 2015-09-15 NOTE — Telephone Encounter (Signed)
Patient states she is feeling much better.  Instructed patient to try Coricidin, plain Robitussin or plain Claritin for symptoms. She was grateful for call.

## 2015-09-25 ENCOUNTER — Encounter: Payer: Self-pay | Admitting: Cardiology

## 2015-10-20 ENCOUNTER — Encounter: Payer: Self-pay | Admitting: Cardiology

## 2015-11-03 NOTE — Progress Notes (Addendum)
Cardiology Office Note    Date:  11/04/2015   ID:  [name], DOB [birth year], MRN [id number]  PCP:  Odette Fraction, MD  Cardiologist:  Fransico Him, MD   Chief Complaint  Patient presents with  . Congestive Heart Failure  . Cardiomyopathy  . Hypertension  . Sleep Apnea    History of Present Illness:  [name] is a 60 y.o. female with a hx of DCM with EF 45%, HTN, LBBB, obesity. Stress test in 2015 with inf and ant wall defects and normal coronary arteries by cath EF 30-35% and severe pulmonary HTN (PASP 67/5mmHg) and NSVT noted on Tele at time of hospitalization for cath. Dry weight 243. She had a 2D echo in July which showed resolution of DCM with EF 55%, G1DD, mild AS, mild MR and normal PAP.  When I last saw her she was complaining of a funny feeling in her chest and a heart monitor was done showing NSR with PVCs and PACs.  She is doing well today.  She denies any chest pain and says that her breathing is doing well.  She has chronic LLE edema which is stable. She denies any PND, orthopnea, dizziness or syncope.  She is also on CPAP therapy and doing well with the device. She uses a nasal pillow mask with no chin strap and tolerates it well. She does not snore. She feels the pressure is adequate enough. She feels rested in the am but has some daytime sleepiness.     Past Medical History:  Diagnosis Date  . Anxiety   . Aortic stenosis, mild    echo 2017  . Breast cancer ([place])    left breast  . Carpal tunnel syndrome   . Cataract   . Chronic diastolic CHF (congestive heart failure) (HCC)    NYHA class II  . Hyperlipidemia   . Hypertension   . Left bundle branch block (LBBB)   . Low back pain   . Neuropathy ([place])   . NICM (nonischemic cardiomyopathy) ([place])    EF 30%, cath 05/20/2015 clean coronary.  EF resolved by echo with EF 55%  . OSA on CPAP   . Pulmonary HTN ([place]) 05/2015   Severe with PASP 67/31mmHg - resolved on followup echo  . PVC's  (premature ventricular contractions) 11/04/2015    Past Surgical History:  Procedure Laterality Date  . CARDIAC CATHETERIZATION N/A 05/20/2015   Procedure: Right/Left Heart Cath and Coronary Angiography;  Surgeon: Troy Sine, MD;  Location: [facility];  Service: Cardiovascular;  Laterality: N/A;  . CATARACT EXTRACTION W/ INTRAOCULAR LENS  IMPLANT, BILATERAL    . DILATION AND CURETTAGE OF UTERUS    . EYE SURGERY    . lumpectomy left breast      Current Medications: Outpatient Medications Prior to Visit  Medication Sig Dispense Refill  . aspirin 81 MG tablet Take 81 mg by mouth daily.    . carvedilol (COREG) 6.25 MG tablet Take 1 tablet (6.25 mg total) by mouth 2 (two) times daily with a meal. 180 tablet 3  . fluvastatin XL (LESCOL XL) 80 MG 24 hr tablet Take 80 mg by mouth daily.    . furosemide (LASIX) 40 MG tablet Take 1-2 tablets by mouth daily as directed 180 tablet 1  . sacubitril-valsartan (ENTRESTO) 24-26 MG Take 1 tablet by mouth 2 (two) times daily. 60 tablet 11  . spironolactone (ALDACTONE) 25 MG tablet Take 0.5 tablets (12.5 mg total) by mouth daily.  30 tablet 5  . vitamin C (ASCORBIC ACID) 500 MG tablet Take 500 mg by mouth as needed.     . hydrALAZINE (APRESOLINE) 25 MG tablet Take 1 tablet (25 mg total) by mouth 3 (three) times daily. (Patient not taking: Reported on 11/04/2015) 90 tablet 11   No facility-administered medications prior to visit.      Allergies:   Atorvastatin; Lipitor [atorvastatin calcium]; Pravachol; Pravastatin; Pravastatin sodium; Sulfa antibiotics; and Sulfonamide derivatives   Social History   Social History  . Marital status: Significant Other    Spouse name: N/A  . Number of children: N/A  . Years of education: N/A   Occupational History  . Truck Geophysicist/field seismologist    Social History Main Topics  . Smoking status: Former Smoker    Quit date: 06/18/2009  . Smokeless tobacco: Never Used  . Alcohol use 0.0 oz/week     Comment: occasional use    . Drug use: No  . Sexual activity: Yes   Other Topics Concern  . None   Social History Narrative  . None     Family History:  The patient's family history includes Alcohol abuse in her father; Arthritis in her mother; Cancer in her father and paternal grandmother; Depression in her father and mother; Drug abuse in her brother; Early death in her maternal grandmother; Hearing loss in her mother; Hyperlipidemia in her father and mother; Hypertension in her father; Miscarriages / [place] in her mother.   ROS:   Please see the history of present illness.    ROS All other systems reviewed and are negative.  No flowsheet data found.     PHYSICAL EXAM:   VS:  BP 122/72   Pulse 77   Ht 5\' 4"  (1.626 m)   Wt 240 lb (108.9 kg)   SpO2 97%   BMI 41.20 kg/m    GEN: Well nourished, well developed, in no acute distress  HEENT: normal  Neck: no JVD, carotid bruits, or masses Cardiac: RRR; no murmurs, rubs, or gallops,no edema.  Intact distal pulses bilaterally.  Respiratory:  clear to auscultation bilaterally, normal work of breathing GI: soft, nontender, nondistended, + BS MS: no deformity or atrophy  Skin: warm and dry, no rash Neuro:  Alert and Oriented x 3, Strength and sensation are intact Psych: euthymic mood, full affect  Wt Readings from Last 3 Encounters:  11/04/15 240 lb (108.9 kg)  08/28/15 240 lb (108.9 kg)  08/11/15 241 lb (109.3 kg)      Studies/Labs Reviewed:   EKG:  EKG is not ordered today.    Recent Labs: 02/17/2015: ALT 19; TSH 1.368 05/21/2015: B Natriuretic Peptide 214.8; Hemoglobin 13.7; Magnesium 1.8; Platelets 317 08/11/2015: BUN 15; Creat 0.87; Potassium 4.3; Sodium 139   Lipid Panel    Component Value Date/Time   CHOL 202 (H) 02/17/2015 0858   TRIG 88 02/17/2015 0858   HDL 64 02/17/2015 0858   CHOLHDL 3.2 02/17/2015 0858   VLDL 18 02/17/2015 0858   LDLCALC 120 02/17/2015 0858    Additional studies/ records that were reviewed today include:   none    ASSESSMENT:    1. Chronic diastolic heart failure ([place])   3. Hypertensive heart disease without heart failure   4. Nonischemic cardiomyopathy ([place])   5. OSA (obstructive sleep apnea)   6. PVC's (premature ventricular contractions)      PLAN:  In order of problems listed above:  1. Chronic diastolic CHF - she appears euvolemic on exam today.  Weight is stable.  She weighs herself daily.  She has no SOB and rarely has LE edema.  Continue PRN diuretic/BB and Entresto. Check BMET. 2. HTN - BP controlled on current meds.  Continue BB and ARB. 3. Nonischemic DCM - this has resolved by recent echo with EF 55%.  Continue BB/Entresto/aldactone. 4.   OSA - the patient is tolerating PAP therapy well without any problems. The patient has been using and benefiting from CPAP use and will continue to benefit from therapy.  5.   PVCs asymptomatic.      Medication Adjustments/Labs and Tests Ordered: Current medicines are reviewed at length with the patient today.  Concerns regarding medicines are outlined above.  Medication changes, Labs and Tests ordered today are listed in the Patient Instructions below.  Patient Instructions  Your physician recommends that you continue on your current medications as directed. Please refer to the Current Medication list given to you today.   Your physician recommends that you return for lab work in:  TODAY  BMET   Your physician wants you to follow-up in:   [place] will receive a reminder letter in the mail two months in advance. If you don't receive a letter, please call our office to schedule the follow-up appointment.    Signed, Fransico Him, MD  11/04/2015 12:06 PM    [address], [place], [place]  [address] Phone: [phone]; Fax: [phone]

## 2015-11-04 ENCOUNTER — Encounter: Payer: Self-pay | Admitting: Cardiology

## 2015-11-04 ENCOUNTER — Encounter (INDEPENDENT_AMBULATORY_CARE_PROVIDER_SITE_OTHER): Payer: Self-pay

## 2015-11-04 ENCOUNTER — Ambulatory Visit (INDEPENDENT_AMBULATORY_CARE_PROVIDER_SITE_OTHER): Payer: Managed Care, Other (non HMO) | Admitting: Cardiology

## 2015-11-04 VITALS — BP 122/72 | HR 77 | Ht 64.0 in | Wt 240.0 lb

## 2015-11-04 DIAGNOSIS — I428 Other cardiomyopathies: Secondary | ICD-10-CM

## 2015-11-04 DIAGNOSIS — I5032 Chronic diastolic (congestive) heart failure: Secondary | ICD-10-CM | POA: Diagnosis not present

## 2015-11-04 DIAGNOSIS — I119 Hypertensive heart disease without heart failure: Secondary | ICD-10-CM

## 2015-11-04 DIAGNOSIS — I350 Nonrheumatic aortic (valve) stenosis: Secondary | ICD-10-CM

## 2015-11-04 DIAGNOSIS — I493 Ventricular premature depolarization: Secondary | ICD-10-CM

## 2015-11-04 DIAGNOSIS — G4733 Obstructive sleep apnea (adult) (pediatric): Secondary | ICD-10-CM

## 2015-11-04 DIAGNOSIS — Z79899 Other long term (current) drug therapy: Secondary | ICD-10-CM

## 2015-11-04 DIAGNOSIS — I429 Cardiomyopathy, unspecified: Secondary | ICD-10-CM | POA: Diagnosis not present

## 2015-11-04 HISTORY — DX: Ventricular premature depolarization: I49.3

## 2015-11-04 LAB — BASIC METABOLIC PANEL
BUN: 17 mg/dL (ref 7–25)
CALCIUM: 9.5 mg/dL (ref 8.6–10.4)
CO2: 25 mmol/L (ref 20–31)
Chloride: 105 mmol/L (ref 98–110)
Creat: 0.89 mg/dL (ref 0.50–0.99)
Glucose, Bld: 112 mg/dL — ABNORMAL HIGH (ref 65–99)
Potassium: 3.7 mmol/L (ref 3.5–5.3)
SODIUM: 141 mmol/L (ref 135–146)

## 2015-11-04 NOTE — Patient Instructions (Signed)
Your physician recommends that you continue on your current medications as directed. Please refer to the Current Medication list given to you today.   Your physician recommends that you return for lab work in:  TODAY  BMET   Your physician wants you to follow-up in:   [place] will receive a reminder letter in the mail two months in advance. If you don't receive a letter, please call our office to schedule the follow-up appointment.

## 2015-11-05 ENCOUNTER — Other Ambulatory Visit: Payer: Self-pay | Admitting: *Deleted

## 2015-11-05 DIAGNOSIS — E785 Hyperlipidemia, unspecified: Secondary | ICD-10-CM

## 2015-11-05 MED ORDER — SIMVASTATIN 80 MG PO TABS: 80.0000 mg | ORAL_TABLET | Freq: Every day | ORAL | Status: DC

## 2015-11-11 ENCOUNTER — Ambulatory Visit: Payer: Managed Care, Other (non HMO) | Admitting: Physician Assistant

## 2015-11-12 ENCOUNTER — Encounter: Payer: Self-pay | Admitting: Family Medicine

## 2015-11-12 ENCOUNTER — Encounter: Payer: Self-pay | Admitting: Physician Assistant

## 2015-11-12 ENCOUNTER — Ambulatory Visit (INDEPENDENT_AMBULATORY_CARE_PROVIDER_SITE_OTHER): Payer: Managed Care, Other (non HMO) | Admitting: Physician Assistant

## 2015-11-12 VITALS — BP 124/74 | HR 83 | Temp 98.0°F | Resp 18 | Wt 239.0 lb

## 2015-11-12 DIAGNOSIS — J988 Other specified respiratory disorders: Secondary | ICD-10-CM

## 2015-11-12 DIAGNOSIS — B9689 Other specified bacterial agents as the cause of diseases classified elsewhere: Principal | ICD-10-CM

## 2015-11-12 MED ORDER — AZITHROMYCIN 250 MG PO TABS: ORAL_TABLET | ORAL | 0 refills | Status: DC

## 2015-11-12 NOTE — Progress Notes (Signed)
Patient ID: TAKOTA RACKOW MRN: [id number], DOB: 03/05/1955, 60 y.o. Date of Encounter: 11/12/2015, 9:05 AM    Chief Complaint:  Chief Complaint  Patient presents with  . URI    headache, hurting started 3 days ago     HPI: 60 y.o. year old female says She has been having nasal congestion and mucus from her nose drainage down her throat cough and headache. Says it's been going on at least 4 days but is getting worse and worse. Says that she has history of pneumonia and that she seems to be prone for things settling in her chest. Through the visit she is blowing her nose a lot and blowing out large amount of thick dark mucus. Through the visit she is also coughing up phlegm. She reports symptoms of having some chills but no documented fever. She is afebrile currently. She is had no significant sore throat. No earache just feels like it's congested behind her ears.     Home Meds:   Outpatient Medications Prior to Visit  Medication Sig Dispense Refill  . aspirin 81 MG tablet Take 81 mg by mouth daily.    . carvedilol (COREG) 6.25 MG tablet Take 1 tablet (6.25 mg total) by mouth 2 (two) times daily with a meal. 180 tablet 3  . fluvastatin XL (LESCOL XL) 80 MG 24 hr tablet Take 80 mg by mouth daily.    . furosemide (LASIX) 40 MG tablet Take 1-2 tablets by mouth daily as directed 180 tablet 1  . sacubitril-valsartan (ENTRESTO) 24-26 MG Take 1 tablet by mouth 2 (two) times daily. 60 tablet 11  . simvastatin (ZOCOR) 80 MG tablet Take 1 tablet (80 mg total) by mouth daily.    Marland Kitchen spironolactone (ALDACTONE) 25 MG tablet Take 0.5 tablets (12.5 mg total) by mouth daily. 30 tablet 5  . vitamin C (ASCORBIC ACID) 500 MG tablet Take 500 mg by mouth as needed.      No facility-administered medications prior to visit.     Allergies:  Allergies  Allergen Reactions  . Atorvastatin     REACTION: mylagias  . Lipitor [Atorvastatin Calcium] Other (See Comments)    myalgia  . Pravachol Other (See  Comments)    myalgia  . Pravastatin     REACTION: myalgias  . Pravastatin Sodium     REACTION: myalgias  . Sulfa Antibiotics Hives  . Sulfonamide Derivatives     REACTION: hives      Review of Systems: See HPI for pertinent ROS. All other ROS negative.    Physical Exam: Blood pressure 124/74, pulse 83, temperature 98 F (36.7 C), temperature source Oral, resp. rate 18, weight 239 lb (108.4 kg)., Body mass index is 41.02 kg/m. General:  Obese AAF. Appears in no acute distress. HEENT: Normocephalic, atraumatic, eyes without discharge, sclera non-icteric, nares are without discharge. Bilateral auditory canals clear, TM's are without perforation, pearly grey and translucent with reflective cone of light bilaterally. Oral cavity moist, posterior pharynx without exudate, erythema, peritonsillar abscess.  Neck: Supple. No thyromegaly. No lymphadenopathy. Lungs: Clear bilaterally to auscultation without wheezes, rales, or rhonchi. Breathing is unlabored. No wheezes on exam. She has good breath sounds. Heart: Regular rhythm. No murmurs, rubs, or gallops. Msk:  Strength and tone normal for age. Extremities/Skin: Warm and dry.  Neuro: Alert and oriented X 3. Moves all extremities spontaneously. Gait is normal. CNII-XII grossly in tact. Psych:  Responds to questions appropriately with a normal affect.     ASSESSMENT AND  PLAN:  60 y.o. year old female with  1. Bacterial respiratory infection She is to take the azithromycin as directed. Also recommend she take Mucinex DM as an expectorant and I have written this down for her. F/U if symptoms worsen or do not resolve within 1 week after completion of antibiotic. Needing a note for out of work today and tomorrow with plan to return Saturday if she is improved. - azithromycin (ZITHROMAX) 250 MG tablet; Day 1: Take 2 daily. Days 2-5: Take 1 daily.  Dispense: 6 tablet; Refill: 0   Signed, [address] [place], [place], [facility] 11/12/2015 9:05 AM

## 2015-11-13 ENCOUNTER — Ambulatory Visit
Admission: RE | Admit: 2015-11-13 | Discharge: 2015-11-13 | Disposition: A | Discharge status: Home / Self Care | Payer: Managed Care, Other (non HMO) | Source: Ambulatory Visit | Attending: Family Medicine | Admitting: Family Medicine

## 2015-11-13 ENCOUNTER — Telehealth: Payer: Self-pay | Admitting: Cardiology

## 2015-11-13 ENCOUNTER — Telehealth: Payer: Self-pay | Admitting: Family Medicine

## 2015-11-13 DIAGNOSIS — R0789 Other chest pain: Secondary | ICD-10-CM

## 2015-11-13 DIAGNOSIS — R0602 Shortness of breath: Secondary | ICD-10-CM

## 2015-11-13 NOTE — Telephone Encounter (Signed)
Christine from [employer] called saying Ms. Grob called their office saying she's feeling worse. She saw [facility] yesterday and was given a Zpack. Altha Harm said she's unsure if Ms. Sorg needs a chest xray or what but she recommends that we give Ms. Gibbs a call. Please advise.  Pt's ph# [phone]  Thank you.

## 2015-11-13 NOTE — Telephone Encounter (Signed)
New message   [name] verbalized that pt is having issues  Pt c/o Shortness Of Breath: STAT if SOB developed within the last 24 hours or pt is noticeably SOB on the phone  1. Are you currently SOB (can you hear that pt is SOB on the phone)? no  2. How long have you been experiencing SOB? 4th or 5th day  3. Are you SOB when sitting or when up moving around? When she lays down; constant   4. Are you currently experiencing any other symptoms? Headaches, flu like symptoms, arms and legs feel heavy

## 2015-11-13 NOTE — Telephone Encounter (Signed)
Pt states she would like results of X RAY before going to an urgent care. Explained to pt the results were not avail as of yet in EPIC. Pt then stated she needs a note for work she works on Saturday and Sunday and would not be able to go to work.

## 2015-11-13 NOTE — Telephone Encounter (Addendum)
FYI - X ray has been ordered

## 2015-11-13 NOTE — Telephone Encounter (Signed)
DISCUSSED WITH  DR   TURNER. PT  NEEDS  TO F/U  WITH  DR PICKARD  ,CALL PLACED TO  DR PICKARD'S  OFFICE  AND LEFT   MESSAGE  WITH DR  PICKARD'S  NURSE  TO CALL PT,  AS PT   FEELS  WORSE TODAY  ./CY  PT AWARE  DR [id number]:[phone] OFFICE  WILL  CALL PT .Adonis Housekeeper

## 2015-11-13 NOTE — Telephone Encounter (Signed)
If she is getting worse, she needs to be seen.  However, I am double booked twice this afternoon and she would have to go to an urgent care.

## 2016-02-06 ENCOUNTER — Other Ambulatory Visit: Payer: Self-pay | Admitting: Physician Assistant

## 2016-02-09 NOTE — Telephone Encounter (Signed)
Review for refill. 

## 2016-02-10 ENCOUNTER — Ambulatory Visit (INDEPENDENT_AMBULATORY_CARE_PROVIDER_SITE_OTHER): Payer: Managed Care, Other (non HMO) | Admitting: Physician Assistant

## 2016-02-10 ENCOUNTER — Encounter: Payer: Self-pay | Admitting: Family Medicine

## 2016-02-10 ENCOUNTER — Ambulatory Visit (INDEPENDENT_AMBULATORY_CARE_PROVIDER_SITE_OTHER): Payer: Managed Care, Other (non HMO) | Admitting: Family Medicine

## 2016-02-10 VITALS — BP 98/60 | HR 68 | Temp 97.7°F | Resp 18 | Ht 64.5 in | Wt 244.0 lb

## 2016-02-10 DIAGNOSIS — J209 Acute bronchitis, unspecified: Secondary | ICD-10-CM

## 2016-02-10 MED ORDER — AZITHROMYCIN 250 MG PO TABS: ORAL_TABLET | ORAL | 0 refills | Status: DC

## 2016-02-10 MED ORDER — HYDROCODONE-HOMATROPINE 5-1.5 MG/5ML PO SYRP: 5.0000 mL | ORAL_SOLUTION | Freq: Three times a day (TID) | ORAL | 0 refills | Status: DC | PRN

## 2016-02-10 NOTE — Progress Notes (Signed)
Subjective:    Patient ID: [name], female    DOB: 12/01/1955, 60 y.o.   MRN: [id number]  HPI Patient states I need an antibiotic. She states that she has been very sick for about a week. Symptoms include coughing spasms that will not stop. Cough is productive of yellow, and green sputum. She also reports green and yellow nasal discharge. She reports head and chest congestion. She denies any fever, chest pain, or shortness of breath. She has a mild sore throat Past Medical History:  Diagnosis Date  . Anxiety   . Aortic stenosis, mild    echo 2017  . Breast cancer ([place])    left breast  . Carpal tunnel syndrome   . Cataract   . Chronic diastolic CHF (congestive heart failure) (HCC)    NYHA class II  . Hyperlipidemia   . Hypertension   . Left bundle branch block (LBBB)   . Low back pain   . Neuropathy ([place])   . NICM (nonischemic cardiomyopathy) ([place])    EF 30%, cath 05/20/2015 clean coronary.  EF resolved by echo with EF 55%  . OSA on CPAP   . Pulmonary HTN 05/2015   Severe with PASP 67/60mmHg - resolved on followup echo  . PVC's (premature ventricular contractions) 11/04/2015   Past Surgical History:  Procedure Laterality Date  . CARDIAC CATHETERIZATION N/A 05/20/2015   Procedure: Right/Left Heart Cath and Coronary Angiography;  Surgeon: Troy Sine, MD;  Location: [facility];  Service: Cardiovascular;  Laterality: N/A;  . CATARACT EXTRACTION W/ INTRAOCULAR LENS  IMPLANT, BILATERAL    . DILATION AND CURETTAGE OF UTERUS    . EYE SURGERY    . lumpectomy left breast     Current Outpatient Prescriptions on File Prior to Visit  Medication Sig Dispense Refill  . aspirin 81 MG tablet Take 81 mg by mouth daily.    . carvedilol (COREG) 6.25 MG tablet Take 1 tablet (6.25 mg total) by mouth 2 (two) times daily with a meal. 180 tablet 3  . fluvastatin XL (LESCOL XL) 80 MG 24 hr tablet Take 80 mg by mouth daily.    . furosemide (LASIX) 40 MG tablet Take 1-2 tablets by mouth  daily as directed 180 tablet 1  . sacubitril-valsartan (ENTRESTO) 24-26 MG Take 1 tablet by mouth 2 (two) times daily. 60 tablet 11  . simvastatin (ZOCOR) 80 MG tablet Take 1 tablet (80 mg total) by mouth daily.    Marland Kitchen spironolactone (ALDACTONE) 25 MG tablet TAKE ONE-HALF TABLET BY MOUTH ONCE DAILY 45 tablet 3  . vitamin C (ASCORBIC ACID) 500 MG tablet Take 500 mg by mouth as needed.      No current facility-administered medications on file prior to visit.    Allergies  Allergen Reactions  . Atorvastatin     REACTION: mylagias  . Lipitor [Atorvastatin Calcium] Other (See Comments)    myalgia  . Pravachol Other (See Comments)    myalgia  . Pravastatin     REACTION: myalgias  . Pravastatin Sodium     REACTION: myalgias  . Sulfa Antibiotics Hives  . Sulfonamide Derivatives     REACTION: hives   Social History   Social History  . Marital status: Significant Other    Spouse name: N/A  . Number of children: N/A  . Years of education: N/A   Occupational History  . Truck Geophysicist/field seismologist    Social History Main Topics  . Smoking status: Former Smoker  Quit date: 06/18/2009  . Smokeless tobacco: Never Used  . Alcohol use 0.0 oz/week     Comment: occasional use  . Drug use: No  . Sexual activity: Yes   Other Topics Concern  . Not on file   Social History Narrative  . No narrative on file      Review of Systems  All other systems reviewed and are negative.      Objective:   Physical Exam  Constitutional: She appears well-developed and well-nourished.  HENT:  Right Ear: External ear normal.  Left Ear: External ear normal.  Nose: Nose normal.  Mouth/Throat: Oropharynx is clear and moist. No oropharyngeal exudate.  Eyes: Conjunctivae are normal.  Neck: Neck supple.  Cardiovascular: Normal rate, regular rhythm and normal heart sounds.   Pulmonary/Chest: Effort normal and breath sounds normal. No respiratory distress. She has no wheezes. She has no rales.  Lymphadenopathy:     She has no cervical adenopathy.  Vitals reviewed.         Assessment & Plan:  Acute bronchitis, unspecified organism - Plan: azithromycin (ZITHROMAX) 250 MG tablet, HYDROcodone-homatropine (HYCODAN) 5-1.5 MG/5ML syrup  I believe the patient has a viral upper respiratory infection. I spent 15 minutes explaining this to the patient. I recommended tincture of time. She can use Hycodan 1 teaspoon every 4-6 hours as needed for cough. I did give her a Z-Pak in case symptoms worsen. She develops a high fever shortness of breath or pleurisy she is to get the antibiotic. However I recommended tincture of time for another 3-4 days

## 2016-02-15 HISTORY — PX: COLONOSCOPY: SHX174

## 2016-03-01 ENCOUNTER — Telehealth: Payer: Self-pay | Admitting: Family Medicine

## 2016-03-01 NOTE — Telephone Encounter (Signed)
Patient is calling to talk to you regarding symvastatin and it being on back order  [phone]

## 2016-03-07 MED ORDER — ROSUVASTATIN CALCIUM 20 MG PO TABS: 20.0000 mg | ORAL_TABLET | Freq: Every day | ORAL | 1 refills | Status: DC

## 2016-03-07 NOTE — Telephone Encounter (Signed)
Pt is allergic to lipitor however she did try crestor in hosp and is willing to try that. Per [facility] Crestor 20mg  - med sent to pharm and pt aware.

## 2016-03-07 NOTE — Telephone Encounter (Signed)
Switch to Lipitor 80 mg a day

## 2016-03-07 NOTE — Telephone Encounter (Signed)
Fluvastatin is on back order and pt is wanting to know if there is something else she can take in its place. Please advise.

## 2016-03-07 NOTE — Telephone Encounter (Signed)
[place] on 03/04/16

## 2016-03-08 ENCOUNTER — Telehealth: Payer: Self-pay

## 2016-03-08 NOTE — Telephone Encounter (Signed)
Prior auth for [employer] submitted to [employer].

## 2016-03-09 ENCOUNTER — Telehealth: Payer: Self-pay

## 2016-03-09 NOTE — Telephone Encounter (Signed)
Entresto approved by [place] through 03/08/2016. Request ID# [id number].

## 2016-03-21 ENCOUNTER — Encounter: Payer: Self-pay | Admitting: Gastroenterology

## 2016-03-21 ENCOUNTER — Ambulatory Visit (INDEPENDENT_AMBULATORY_CARE_PROVIDER_SITE_OTHER): Payer: Managed Care, Other (non HMO) | Admitting: Family Medicine

## 2016-03-21 ENCOUNTER — Encounter: Payer: Self-pay | Admitting: Family Medicine

## 2016-03-21 VITALS — BP 98/64 | HR 64 | Temp 97.7°F | Resp 20 | Ht 64.5 in | Wt 251.0 lb

## 2016-03-21 DIAGNOSIS — Z23 Encounter for immunization: Secondary | ICD-10-CM

## 2016-03-21 DIAGNOSIS — Z1211 Encounter for screening for malignant neoplasm of colon: Secondary | ICD-10-CM

## 2016-03-21 DIAGNOSIS — I428 Other cardiomyopathies: Secondary | ICD-10-CM

## 2016-03-21 DIAGNOSIS — Z0000 Encounter for general adult medical examination without abnormal findings: Secondary | ICD-10-CM

## 2016-03-21 LAB — CBC WITH DIFFERENTIAL/PLATELET
BASOS PCT: 0 %
Basophils Absolute: 0 cells/uL (ref 0–200)
EOS ABS: 158 {cells}/uL (ref 15–500)
Eosinophils Relative: 2 %
HEMATOCRIT: 37.6 % (ref 35.0–45.0)
HEMOGLOBIN: 12.5 g/dL (ref 12.0–15.0)
Lymphocytes Relative: 29 %
Lymphs Abs: 2291 cells/uL (ref 850–3900)
MCH: 30.6 pg (ref 27.0–33.0)
MCHC: 33.2 g/dL (ref 32.0–36.0)
MCV: 92.2 fL (ref 80.0–100.0)
MONO ABS: 711 {cells}/uL (ref 200–950)
MPV: 9.4 fL (ref 7.5–12.5)
Monocytes Relative: 9 %
NEUTROS ABS: 4740 {cells}/uL (ref 1500–7800)
Neutrophils Relative %: 60 %
PLATELETS: 323 10*3/uL (ref 140–400)
RBC: 4.08 MIL/uL (ref 3.80–5.10)
RDW: 14.5 % (ref 11.0–15.0)
WBC: 7.9 10*3/uL (ref 3.8–10.8)

## 2016-03-21 LAB — LIPID PANEL
CHOL/HDL RATIO: 2.4 ratio (ref <5.0)
Cholesterol: 161 mg/dL (ref <200)
HDL: 68 mg/dL (ref >50)
LDL Cholesterol: 79 mg/dL (ref <100)
TRIGLYCERIDES: 70 mg/dL (ref <150)
VLDL: 14 mg/dL (ref <30)

## 2016-03-21 LAB — COMPLETE METABOLIC PANEL WITH GFR
ALK PHOS: 49 U/L (ref 33–130)
ALT: 15 U/L (ref 6–29)
AST: 14 U/L (ref 10–35)
Albumin: 3.9 g/dL (ref 3.6–5.1)
BUN: 23 mg/dL (ref 7–25)
CO2: 25 mmol/L (ref 20–31)
Calcium: 9.4 mg/dL (ref 8.6–10.4)
Chloride: 108 mmol/L (ref 98–110)
Creat: 0.79 mg/dL (ref 0.50–0.99)
GFR, EST NON AFRICAN AMERICAN: 81 mL/min (ref >=60)
Glucose, Bld: 115 mg/dL — ABNORMAL HIGH (ref 70–99)
POTASSIUM: 3.8 mmol/L (ref 3.5–5.3)
SODIUM: 141 mmol/L (ref 135–146)
Total Bilirubin: 0.4 mg/dL (ref 0.2–1.2)
Total Protein: 6.4 g/dL (ref 6.1–8.1)

## 2016-03-21 NOTE — Addendum Note (Signed)
Addended by: Shary Decamp B on: 03/21/2016 03:07 PM   Modules accepted: Orders

## 2016-03-21 NOTE — Progress Notes (Signed)
Subjective:    Patient ID: [name], female    DOB: 27-Aug-1955, 61 y.o.   MRN: [id number]  HPI  Patient is a very pleasant 61 year old African-American female who is here today for physical exam. Her last colonoscopy was in 2006. She is due again. She does have a history of breast cancer status post surgical resection in her left breast.  Patient Is due for mammogram but she would like to schedule this on her own.  Pap smear was performed last year and was normal. Therefore she is not due for repeat Pap smear until 2020  Past medical history includes hypertension, hyperlipidemia, obstructive sleep apnea, and mildly suppressed ejection fraction of 45% due to left bundle branch block and hypertensive cardiomyopathy.  Most recent echocardiogram in July revealed an ejection fraction of 55% Past Medical History:  Diagnosis Date  . Anxiety   . Aortic stenosis, mild    echo 2017  . Breast cancer ([place])    left breast  . Carpal tunnel syndrome   . Cataract   . Chronic diastolic CHF (congestive heart failure) (HCC)    NYHA class II  . Hyperlipidemia   . Hypertension   . Left bundle branch block (LBBB)   . Low back pain   . Neuropathy ([place])   . NICM (nonischemic cardiomyopathy) ([facility])    EF 30%, cath 05/20/2015 clean coronary.  EF resolved by echo with EF 55%  . OSA on CPAP   . Pulmonary HTN 05/2015   Severe with PASP 67/30mmHg - resolved on followup echo  . PVC's (premature ventricular contractions) 11/04/2015   Past Surgical History:  Procedure Laterality Date  . CARDIAC CATHETERIZATION N/A 05/20/2015   Procedure: Right/Left Heart Cath and Coronary Angiography;  Surgeon: Troy Sine, MD;  Location: [facility];  Service: Cardiovascular;  Laterality: N/A;  . CATARACT EXTRACTION W/ INTRAOCULAR LENS  IMPLANT, BILATERAL    . DILATION AND CURETTAGE OF UTERUS    . EYE SURGERY    . lumpectomy left breast     Current Outpatient Prescriptions on File Prior to Visit  Medication Sig  Dispense Refill  . aspirin 81 MG tablet Take 81 mg by mouth daily.    . carvedilol (COREG) 6.25 MG tablet Take 1 tablet (6.25 mg total) by mouth 2 (two) times daily with a meal. 180 tablet 3  . furosemide (LASIX) 40 MG tablet Take 1-2 tablets by mouth daily as directed 180 tablet 1  . rosuvastatin (CRESTOR) 20 MG tablet Take 1 tablet (20 mg total) by mouth daily. 90 tablet 1  . sacubitril-valsartan (ENTRESTO) 24-26 MG Take 1 tablet by mouth 2 (two) times daily. 60 tablet 11  . spironolactone (ALDACTONE) 25 MG tablet TAKE ONE-HALF TABLET BY MOUTH ONCE DAILY 45 tablet 3  . vitamin C (ASCORBIC ACID) 500 MG tablet Take 500 mg by mouth as needed.      No current facility-administered medications on file prior to visit.    Allergies  Allergen Reactions  . Atorvastatin     REACTION: mylagias  . Lipitor [Atorvastatin Calcium] Other (See Comments)    myalgia  . Pravachol Other (See Comments)    myalgia  . Pravastatin     REACTION: myalgias  . Pravastatin Sodium     REACTION: myalgias  . Sulfa Antibiotics Hives  . Sulfonamide Derivatives     REACTION: hives   Social History   Social History  . Marital status: Significant Other    Spouse name: N/A  .  Number of children: N/A  . Years of education: N/A   Occupational History  . Truck Geophysicist/field seismologist    Social History Main Topics  . Smoking status: Former Smoker    Quit date: 06/18/2009  . Smokeless tobacco: Never Used  . Alcohol use 0.0 oz/week     Comment: occasional use  . Drug use: No  . Sexual activity: Yes   Other Topics Concern  . Not on file   Social History Narrative  . No narrative on file   Family History  Problem Relation Age of Onset  . Arthritis Mother   . Depression Mother   . Hearing loss Mother   . Hyperlipidemia Mother   . Miscarriages / [place] Mother   . Alcohol abuse Father   . Cancer Father     PROSTATE  . Depression Father   . Hypertension Father   . Hyperlipidemia Father   . Drug abuse Brother   .  Early death Maternal Grandmother   . Cancer Paternal Grandmother   . Heart attack Neg Hx      Review of Systems  All other systems reviewed and are negative.      Objective:   Physical Exam  Constitutional: She is oriented to person, place, and time. She appears well-developed and well-nourished. No distress.  HENT:  Head: Normocephalic and atraumatic.  Right Ear: External ear normal.  Left Ear: External ear normal.  Nose: Nose normal.  Mouth/Throat: Oropharynx is clear and moist. No oropharyngeal exudate.  Eyes: Conjunctivae and EOM are normal. Pupils are equal, round, and reactive to light. Right eye exhibits no discharge. Left eye exhibits no discharge. No scleral icterus.  Neck: Normal range of motion. Neck supple. No JVD present. No tracheal deviation present. No thyromegaly present.  Cardiovascular: Normal rate, regular rhythm, normal heart sounds and intact distal pulses.  Exam reveals no gallop and no friction rub.   No murmur heard. Pulmonary/Chest: Effort normal and breath sounds normal. No stridor. No respiratory distress. She has no wheezes. She has no rales. She exhibits no tenderness.  Abdominal: Soft. Bowel sounds are normal. She exhibits no distension and no mass. There is no tenderness. There is no rebound and no guarding.  Genitourinary: Vagina normal and uterus normal. No vaginal discharge found.  Musculoskeletal: Normal range of motion. She exhibits no edema or tenderness.  Lymphadenopathy:    She has no cervical adenopathy.  Neurological: She is alert and oriented to person, place, and time. She has normal reflexes. No cranial nerve deficit. She exhibits normal muscle tone. Coordination normal.  Skin: Skin is warm. No rash noted. She is not diaphoretic. No erythema. No pallor.  Psychiatric: She has a normal mood and affect. Her behavior is normal. Judgment and thought content normal.  Vitals reviewed.         Assessment & Plan:  Other cardiomyopathy ([place])  - Plan: CBC with Differential/Platelet, COMPLETE METABOLIC PANEL WITH GFR, Lipid panel  Routine general medical examination at a health care facility  I continue to encourage diet exercise and weight loss. Her blood pressures well controlled today. She will schedule her mammogram. I will schedule the patient for colonoscopy. Pap smear is not due until 2020. Flu shot is up-to-date. Pneumovax 23 is up-to-date. She received Prevnar 13. We discussed the shingles vaccine. The remainder of her preventative care is up-to-date. I will check a CBC, CMP, fasting lipid panel

## 2016-03-22 ENCOUNTER — Encounter: Payer: Self-pay | Admitting: Family Medicine

## 2016-03-23 ENCOUNTER — Encounter: Payer: Self-pay | Admitting: Family Medicine

## 2016-05-02 ENCOUNTER — Encounter: Payer: Self-pay | Admitting: Gastroenterology

## 2016-05-02 ENCOUNTER — Ambulatory Visit (AMBULATORY_SURGERY_CENTER): Payer: Self-pay

## 2016-05-02 VITALS — Ht 64.5 in | Wt 251.6 lb

## 2016-05-02 DIAGNOSIS — Z860100 Personal history of colon polyps, unspecified: Secondary | ICD-10-CM

## 2016-05-02 MED ORDER — SUPREP BOWEL PREP KIT 17.5-3.13-1.6 GM/177ML PO SOLN: 1.0000 | Freq: Once | ORAL | 0 refills | Status: AC

## 2016-05-02 NOTE — Progress Notes (Signed)
No allergies to eggs or soy No diet meds No home oxygen No past problems with anesthesia  Registered for emmi 

## 2016-05-16 ENCOUNTER — Encounter: Payer: Self-pay | Admitting: Gastroenterology

## 2016-05-16 ENCOUNTER — Ambulatory Visit (AMBULATORY_SURGERY_CENTER): Payer: Managed Care, Other (non HMO) | Admitting: Gastroenterology

## 2016-05-16 VITALS — BP 150/68 | HR 55 | Temp 96.0°F | Resp 14 | Ht 64.0 in | Wt 251.0 lb

## 2016-05-16 DIAGNOSIS — Z800 Family history of malignant neoplasm of digestive organs: Secondary | ICD-10-CM | POA: Diagnosis not present

## 2016-05-16 DIAGNOSIS — D124 Benign neoplasm of descending colon: Secondary | ICD-10-CM

## 2016-05-16 DIAGNOSIS — Z86010 Personal history of colonic polyps: Secondary | ICD-10-CM | POA: Diagnosis not present

## 2016-05-16 DIAGNOSIS — D125 Benign neoplasm of sigmoid colon: Secondary | ICD-10-CM | POA: Diagnosis not present

## 2016-05-16 DIAGNOSIS — D122 Benign neoplasm of ascending colon: Secondary | ICD-10-CM

## 2016-05-16 DIAGNOSIS — D128 Benign neoplasm of rectum: Secondary | ICD-10-CM

## 2016-05-16 DIAGNOSIS — D123 Benign neoplasm of transverse colon: Secondary | ICD-10-CM

## 2016-05-16 MED ORDER — SODIUM CHLORIDE 0.9 % IV SOLN: 500.0000 mL | INTRAVENOUS | Status: DC

## 2016-05-16 NOTE — Op Note (Signed)
[facility] Patient Name: [name] Procedure Date: 05/16/2016 9:52 AM MRN: [id number] Endoscopist: Remo Lipps P. Shaday Rayborn MD, MD Age: 61 Referring MD:  Date of Birth: 01/25/1956 Gender: Female Account #: [id number] Procedure:                Colonoscopy Indications:              High risk colon cancer surveillance: Personal                            history of colonic polyps, mother with colon cancer                            age 2s Medicines:                Monitored Anesthesia Care Procedure:                Pre-Anesthesia Assessment:                           - Prior to the procedure, a History and Physical                            was performed, and patient medications and                            allergies were reviewed. The patient's tolerance of                            previous anesthesia was also reviewed. The risks                            and benefits of the procedure and the sedation                            options and risks were discussed with the patient.                            All questions were answered, and informed consent                            was obtained. Prior Anticoagulants: The patient has                            taken no previous anticoagulant or antiplatelet                            agents. ASA Grade Assessment: III - A patient with                            severe systemic disease. After reviewing the risks                            and benefits, the patient was deemed in  satisfactory condition to undergo the procedure.                           After obtaining informed consent, the colonoscope                            was passed under direct vision. Throughout the                            procedure, the patient's blood pressure, pulse, and                            oxygen saturations were monitored continuously. The                            Model PCF-H190DL [id number]) scope was  introduced                            through the anus and advanced to the the terminal                            ileum, with identification of the appendiceal                            orifice and IC valve. The colonoscopy was performed                            without difficulty. The patient tolerated the                            procedure well. The quality of the bowel                            preparation was adequate. The terminal ileum,                            ileocecal valve, appendiceal orifice, and rectum                            were photographed. Scope In: 10:00:42 AM Scope Out: 10:29:17 AM Scope Withdrawal Time: 0 hours 26 minutes 0 seconds  Total Procedure Duration: 0 hours 28 minutes 35 seconds  Findings:                 The perianal and digital rectal examinations were                            normal.                           Four sessile polyps were found in the ascending                            colon. The polyps were 3 to 6 mm in size. These  polyps were removed with a cold snare. Resection                            and retrieval were complete.                           A 8 mm polyp was found in the hepatic flexure. The                            polyp was flat. The polyp was removed with a cold                            snare. Resection and retrieval were complete.                           A 7 mm polyp was found in the transverse colon. The                            polyp was flat. The polyp was removed with a cold                            snare. Resection and retrieval were complete.                           A 6 mm polyp was found in the descending colon. The                            polyp was sessile. The polyp was removed with a                            cold snare. Resection and retrieval were complete.                           A 5 mm polyp was found in the sigmoid colon. The                            polyp was  sessile. The polyp was removed with a                            cold snare. Resection and retrieval were complete.                           A 4 mm polyp was found in the rectum. The polyp was                            sessile. The polyp was removed with a cold snare.                            Resection and retrieval were complete.  Multiple medium-mouthed diverticula were found in                            the left colon.                           Internal hemorrhoids were found during retroflexion.                           The terminal ileum appeared normal.                           The exam was otherwise without abnormality. Complications:            No immediate complications. Estimated blood loss:                            Minimal. Estimated Blood Loss:     Estimated blood loss was minimal. Impression:               - Four 3 to 6 mm polyps in the ascending colon,                            removed with a cold snare. Resected and retrieved.                           - One 8 mm polyp at the hepatic flexure, removed                            with a cold snare. Resected and retrieved.                           - One 7 mm polyp in the transverse colon, removed                            with a cold snare. Resected and retrieved.                           - One 6 mm polyp in the descending colon, removed                            with a cold snare. Resected and retrieved.                           - One 5 mm polyp in the sigmoid colon, removed with                            a cold snare. Resected and retrieved.                           - One 4 mm polyp in the rectum, removed with a cold                            snare. Resected and retrieved.                           -  Diverticulosis in the left colon.                           - Internal hemorrhoids.                           - The examined portion of the ileum was normal.                           -  The examination was otherwise normal. Recommendation:           - Patient has a contact number available for                            emergencies. The signs and symptoms of potential                            delayed complications were discussed with the                            patient. Return to normal activities tomorrow.                            Written discharge instructions were provided to the                            patient.                           - Resume previous diet.                           - Continue present medications.                           - No ibuprofen, naproxen, or other non-steroidal                            anti-inflammatory drugs for 2 weeks after polyp                            removal.                           - Await pathology results.                           - Repeat colonoscopy is recommended for                            surveillance. The colonoscopy date will be                            determined after pathology results from today's                            exam become available for review. Remo Lipps P.  Parveen Freehling MD, MD 05/16/2016 10:38:21 AM This report has been signed electronically.

## 2016-05-16 NOTE — Patient Instructions (Signed)
YOU HAD AN ENDOSCOPIC PROCEDURE TODAY AT [facility]:   Refer to the procedure report that was given to you for any specific questions about what was found during the examination.  If the procedure report does not answer your questions, please call your gastroenterologist to clarify.  If you requested that your care partner not be given the details of your procedure findings, then the procedure report has been included in a sealed envelope for you to review at your convenience later.  YOU SHOULD EXPECT: Some feelings of bloating in the abdomen. Passage of more gas than usual.  Walking can help get rid of the air that was put into your GI tract during the procedure and reduce the bloating. If you had a lower endoscopy (such as a colonoscopy or flexible sigmoidoscopy) you may notice spotting of blood in your stool or on the toilet paper. If you underwent a bowel prep for your procedure, you may not have a normal bowel movement for a few days.  Please Note:  You might notice some irritation and congestion in your nose or some drainage.  This is from the oxygen used during your procedure.  There is no need for concern and it should clear up in a day or so.  SYMPTOMS TO REPORT IMMEDIATELY:   Following lower endoscopy (colonoscopy or flexible sigmoidoscopy):  Excessive amounts of blood in the stool  Significant tenderness or worsening of abdominal pains  Swelling of the abdomen that is new, acute  Fever of 100F or higher    For urgent or emergent issues, a gastroenterologist can be reached at any hour by calling [phone].   DIET:  We do recommend a small meal at first, but then you may proceed to your regular diet.  Drink plenty of fluids but you should avoid alcoholic beverages for 24 hours.  ACTIVITY:  You should plan to take it easy for the rest of today and you should NOT DRIVE or use heavy machinery until tomorrow (because of the sedation medicines used during the test).     FOLLOW UP: Our staff will call the number listed on your records the next business day following your procedure to check on you and address any questions or concerns that you may have regarding the information given to you following your procedure. If we do not reach you, we will leave a message.  However, if you are feeling well and you are not experiencing any problems, there is no need to return our call.  We will assume that you have returned to your regular daily activities without incident.  If any biopsies were taken you will be contacted by phone or by letter within the next 1-3 weeks.  Please call us at [phone] if you have not heard about the biopsies in 3 weeks.    SIGNATURES/CONFIDENTIALITY: You and/or your care partner have signed paperwork which will be entered into your electronic medical record.  These signatures attest to the fact that that the information above on your After Visit Summary has been reviewed and is understood.  Full responsibility of the confidentiality of this discharge information lies with you and/or your care-partner.   NO IBUPROFEN,NAPROXEN ,OR OTHER ANTI INFLAMMATORY DRUGS FOR 2 WEEKS AFTER POLYP REMOVAL  INFORMATION ON POLYPS  DIVERTICULOSIS,AND HEMORRHOIDS GIVEN TO YOU TODAY  AWAIT PATHOLOGY RESULTS

## 2016-05-16 NOTE — Progress Notes (Signed)
Pt's states no medical or surgical changes since previsit or office visit. 

## 2016-05-16 NOTE — Progress Notes (Signed)
Called to room to assist during endoscopic procedure.  Patient ID and intended procedure confirmed with present staff. Received instructions for my participation in the procedure from the performing physician.  

## 2016-05-16 NOTE — Progress Notes (Signed)
A/ox3, pleased with MAC, report to RN 

## 2016-05-17 ENCOUNTER — Telehealth: Payer: Self-pay | Admitting: *Deleted

## 2016-05-17 DIAGNOSIS — G4733 Obstructive sleep apnea (adult) (pediatric): Secondary | ICD-10-CM

## 2016-05-17 NOTE — Telephone Encounter (Signed)
Patient called today stating they needed Cpap supplies, new order sent to [facility] ([facility]) today

## 2016-05-17 NOTE — Telephone Encounter (Signed)
  Follow up Call-  Call back number 05/16/2016  Post procedure Call Back phone  # [phone]  Permission to leave phone message Yes  Some recent data might be hidden     Patient questions:  Do you have a fever, pain , or abdominal swelling? No. Pain Score  0 *  Have you tolerated food without any problems? Yes.    Have you been able to return to your normal activities? Yes.    Do you have any questions about your discharge instructions: Diet   No. Medications  No. Follow up visit  No.  Do you have questions or concerns about your Care? No.  Actions: * If pain score is 4 or above: No action needed, pain <4.

## 2016-05-20 ENCOUNTER — Encounter: Payer: Self-pay | Admitting: Gastroenterology

## 2016-05-27 ENCOUNTER — Other Ambulatory Visit: Payer: Self-pay | Admitting: *Deleted

## 2016-05-27 MED ORDER — CARVEDILOL 6.25 MG PO TABS: 6.2500 mg | ORAL_TABLET | Freq: Two times a day (BID) | ORAL | 1 refills | Status: DC

## 2016-07-20 ENCOUNTER — Telehealth: Payer: Self-pay | Admitting: Family Medicine

## 2016-07-20 NOTE — Telephone Encounter (Signed)
Pt called back and states that the crestor is causing a lot of muscle pain and she stopped taking it and feels much better. She can not afford Lescol but was on simvastatin at one time without any problems.

## 2016-07-20 NOTE — Telephone Encounter (Signed)
Pt called [facility] that she was having trouble taking the cholesterol medication but did not go into detail and for me to please call her back.   Placed call to pt and no answer [facility]

## 2016-07-21 MED ORDER — SIMVASTATIN 40 MG PO TABS: 40.0000 mg | ORAL_TABLET | Freq: Every day | ORAL | 3 refills | Status: DC

## 2016-07-21 NOTE — Telephone Encounter (Signed)
Try simvastatin 40 mg poqday and remove crestor from med list

## 2016-07-21 NOTE — Telephone Encounter (Signed)
Medication called/sent to requested pharmacy and pt aware 

## 2016-07-28 ENCOUNTER — Other Ambulatory Visit: Payer: Self-pay

## 2016-07-28 MED ORDER — SACUBITRIL-VALSARTAN 24-26 MG PO TABS: 1.0000 | ORAL_TABLET | Freq: Two times a day (BID) | ORAL | 2 refills | Status: DC

## 2016-07-28 NOTE — Telephone Encounter (Signed)
In order of problems listed above:  1. Chronic diastolic CHF - she appears euvolemic on exam today.  Weight is stable.  She weighs herself daily.  She has no SOB and rarely has LE edema.  Continue PRN diuretic/BB and Entresto. Check BMET. 2. HTN - BP controlled on current meds.  Continue BB and ARB. 3. Nonischemic DCM - this has resolved by recent echo with EF 55%.  Continue BB/Entresto/aldactone. 4.   OSA - the patient is tolerating PAP therapy well without any problems. The patient has been using and benefiting from CPAP use and will continue to benefit from therapy.  5.   PVCs asymptomatic.      Medication Adjustments/Labs and Tests Ordered: Current medicines are reviewed at length with the patient today.  Concerns regarding medicines are outlined above.  Medication changes, Labs and Tests ordered today are listed in the Patient Instructions below.  Patient Instructions  Your physician recommends that you continue on your current medications as directed. Please refer to the Current Medication list given to you today.   Your physician recommends that you return for lab work in:  TODAY  BMET   Your physician wants you to follow-up in:   [place] will receive a reminder letter in the mail two months in advance. If you don't receive a letter, please call our office to schedule the follow-up appointment.    Signed, Fransico Him, MD  11/04/2015 12:06 PM

## 2016-08-24 ENCOUNTER — Other Ambulatory Visit: Payer: Self-pay | Admitting: Cardiology

## 2016-09-13 ENCOUNTER — Encounter (INDEPENDENT_AMBULATORY_CARE_PROVIDER_SITE_OTHER): Payer: Self-pay

## 2016-09-13 ENCOUNTER — Encounter: Payer: Self-pay | Admitting: Family Medicine

## 2016-09-13 ENCOUNTER — Other Ambulatory Visit: Payer: Self-pay

## 2016-09-13 ENCOUNTER — Ambulatory Visit (INDEPENDENT_AMBULATORY_CARE_PROVIDER_SITE_OTHER): Payer: Managed Care, Other (non HMO) | Admitting: Family Medicine

## 2016-09-13 ENCOUNTER — Ambulatory Visit (HOSPITAL_COMMUNITY): Payer: Managed Care, Other (non HMO) | Attending: Cardiology

## 2016-09-13 VITALS — BP 128/80 | HR 86 | Temp 98.5°F | Resp 20 | Ht 64.5 in | Wt 264.0 lb

## 2016-09-13 DIAGNOSIS — I110 Hypertensive heart disease with heart failure: Secondary | ICD-10-CM | POA: Diagnosis not present

## 2016-09-13 DIAGNOSIS — I5042 Chronic combined systolic (congestive) and diastolic (congestive) heart failure: Secondary | ICD-10-CM | POA: Diagnosis not present

## 2016-09-13 DIAGNOSIS — M5432 Sciatica, left side: Secondary | ICD-10-CM | POA: Diagnosis not present

## 2016-09-13 DIAGNOSIS — I340 Nonrheumatic mitral (valve) insufficiency: Secondary | ICD-10-CM | POA: Insufficient documentation

## 2016-09-13 DIAGNOSIS — E785 Hyperlipidemia, unspecified: Secondary | ICD-10-CM | POA: Diagnosis not present

## 2016-09-13 DIAGNOSIS — G4733 Obstructive sleep apnea (adult) (pediatric): Secondary | ICD-10-CM | POA: Diagnosis not present

## 2016-09-13 DIAGNOSIS — Z853 Personal history of malignant neoplasm of breast: Secondary | ICD-10-CM | POA: Insufficient documentation

## 2016-09-13 MED ORDER — PREDNISONE 20 MG PO TABS: ORAL_TABLET | ORAL | 0 refills | Status: DC

## 2016-09-13 MED ORDER — HYDROCODONE-ACETAMINOPHEN 5-325 MG PO TABS: 1.0000 | ORAL_TABLET | Freq: Four times a day (QID) | ORAL | 0 refills | Status: DC | PRN

## 2016-09-13 NOTE — Progress Notes (Signed)
Subjective:    Patient ID: [name], female    DOB: August 23, 1955, 61 y.o.   MRN: [id number]  HPI Patient presents with acute onset of left-sided low back pain. Pain is located around the level of L5 over to the left side just above the gluteus. She also complains of pain occasionally radiating down the lateral aspect of her left thigh as well as numbness and tingling radiating down into her left heel. She denies any bowel or bladder incontinence. She denies any hematuria or dysuria. She has no CVA tenderness. However she is having some tenderness to palpation of the left paraspinal lumbar muscles. Past Medical History:  Diagnosis Date  . Anxiety   . Aortic stenosis, mild    echo 2017  . Breast cancer ([place])    left breast  . Carpal tunnel syndrome   . Cataract   . Chronic diastolic CHF (congestive heart failure) (HCC)    NYHA class II  . Hyperlipidemia   . Hypertension   . Left bundle branch block (LBBB)   . Low back pain   . Neuropathy   . NICM (nonischemic cardiomyopathy) ([place])    EF 30%, cath 05/20/2015 clean coronary.  EF resolved by echo with EF 55%  . OSA on CPAP   . Prediabetes   . Pulmonary HTN ([place]) 05/2015   Severe with PASP 67/21mmHg - resolved on followup echo  . PVC's (premature ventricular contractions) 11/04/2015   Past Surgical History:  Procedure Laterality Date  . CARDIAC CATHETERIZATION N/A 05/20/2015   Procedure: Right/Left Heart Cath and Coronary Angiography;  Surgeon: Troy Sine, MD;  Location: [facility];  Service: Cardiovascular;  Laterality: N/A;  . CATARACT EXTRACTION W/ INTRAOCULAR LENS  IMPLANT, BILATERAL    . DILATION AND CURETTAGE OF UTERUS    . lumpectomy left breast    . UTERINE FIBROID SURGERY     Current Outpatient Prescriptions on File Prior to Visit  Medication Sig Dispense Refill  . acetaminophen (TYLENOL) 325 MG tablet Take 650 mg by mouth every 6 (six) hours as needed.    Marland Kitchen aspirin 81 MG tablet Take 81 mg by mouth daily.      . carvedilol (COREG) 6.25 MG tablet Take 1 tablet (6.25 mg total) by mouth 2 (two) times daily with a meal. 180 tablet 1  . furosemide (LASIX) 40 MG tablet TAKE ONE TO TWO TABLETS BY MOUTH DAILY AS DIRECTED 180 tablet 0  . ibuprofen (ADVIL,MOTRIN) 200 MG tablet Take 200 mg by mouth every 6 (six) hours as needed.    . Loratadine 10 MG CAPS Take by mouth daily as needed.     . Multiple Vitamin (MULTIVITAMIN) tablet Take 1 tablet by mouth daily.    . sacubitril-valsartan (ENTRESTO) 24-26 MG Take 1 tablet by mouth 2 (two) times daily. 60 tablet 2  . spironolactone (ALDACTONE) 25 MG tablet TAKE ONE-HALF TABLET BY MOUTH ONCE DAILY 45 tablet 3  . vitamin C (ASCORBIC ACID) 500 MG tablet Take 500 mg by mouth as needed.      No current facility-administered medications on file prior to visit.    Allergies  Allergen Reactions  . Atorvastatin     REACTION: mylagias  . Lipitor [Atorvastatin Calcium] Other (See Comments)    myalgia  . Pravachol Other (See Comments)    myalgia  . Pravastatin     REACTION: myalgias  . Pravastatin Sodium     REACTION: myalgias  . Sulfa Antibiotics Hives  . Sulfonamide  Derivatives     REACTION: hives   Social History   Social History  . Marital status: Significant Other    Spouse name: N/A  . Number of children: N/A  . Years of education: N/A   Occupational History  . Truck Geophysicist/field seismologist    Social History Main Topics  . Smoking status: Former Smoker    Quit date: 06/18/2009  . Smokeless tobacco: Never Used  . Alcohol use 0.0 oz/week     Comment: once every 2 months  . Drug use: No  . Sexual activity: Yes   Other Topics Concern  . Not on file   Social History Narrative  . No narrative on file      Review of Systems  All other systems reviewed and are negative.      Objective:   Physical Exam  Cardiovascular: Normal rate, regular rhythm and normal heart sounds.   Pulmonary/Chest: Effort normal and breath sounds normal. No respiratory distress.  She has no wheezes. She has no rales.  Abdominal: Soft. Bowel sounds are normal.  Musculoskeletal:       Lumbar back: She exhibits decreased range of motion, tenderness, pain and spasm. She exhibits no bony tenderness, no edema, no deformity and no laceration.       Back:  Neurological: She displays normal reflexes. She exhibits normal muscle tone. Coordination normal.  Vitals reviewed.         Assessment & Plan:  Left sided sciatica - Plan: predniSONE (DELTASONE) 20 MG tablet, HYDROcodone-acetaminophen (NORCO) 5-325 MG tablet  I suspect the patient has herniated a disc and has left-sided lumbar radiculopathy secondary to that. I'll treat the patient with a prednisone taper pack to address the herniated disc and also hydrocodone/acetaminophen 5/325 one by mouth every 6 hours when necessary pain. Reassess in one week or sooner if worse. Patient should be out of work for the next 48 hours and then reassess

## 2016-09-15 ENCOUNTER — Telehealth: Payer: Self-pay | Admitting: *Deleted

## 2016-09-15 NOTE — Telephone Encounter (Signed)
Return call: Informed patient of compliance results and patient understanding was verbalized. Patient understands her settings will not change. Patient was grateful for the call and thanked me

## 2016-09-15 NOTE — Telephone Encounter (Signed)
-----   Message from Sueanne Margarita, MD sent at 09/05/2016  7:49 PM EDT ----- Good AHI and compliance.  Continue current CPAP settings.

## 2016-09-26 ENCOUNTER — Encounter: Payer: Self-pay | Admitting: Family Medicine

## 2016-09-26 ENCOUNTER — Telehealth: Payer: Self-pay | Admitting: Family Medicine

## 2016-09-26 ENCOUNTER — Ambulatory Visit (INDEPENDENT_AMBULATORY_CARE_PROVIDER_SITE_OTHER): Payer: Managed Care, Other (non HMO) | Admitting: Family Medicine

## 2016-09-26 VITALS — BP 114/76 | HR 76 | Temp 97.6°F | Resp 20 | Wt 264.0 lb

## 2016-09-26 DIAGNOSIS — B029 Zoster without complications: Secondary | ICD-10-CM

## 2016-09-26 MED ORDER — VALACYCLOVIR HCL 1 G PO TABS: 1000.0000 mg | ORAL_TABLET | Freq: Three times a day (TID) | ORAL | 0 refills | Status: DC

## 2016-09-26 NOTE — Telephone Encounter (Signed)
Friday evening family member noted ?bites?  On head and in hair.  Only on right side.  Says they are painful.  Thinks it is Shingles?  Needs work note if can not work.  Told her come here now will be quick visits to see assess and treat.

## 2016-09-26 NOTE — Progress Notes (Signed)
Subjective:    Patient ID: [name], female    DOB: 11/02/1955, 61 y.o.   MRN: [id number]  HPI Recently took prednisone for sciatica. 2 days ago developed an erythematous vesicular rash on her right forehead and in her scalp. The rash is sparing the area around the eye. The rash burns and is painful. Past Medical History:  Diagnosis Date  . Anxiety   . Aortic stenosis, mild    echo 2017  . Breast cancer ([place])    left breast  . Carpal tunnel syndrome   . Cataract   . Chronic diastolic CHF (congestive heart failure) (HCC)    NYHA class II  . Hyperlipidemia   . Hypertension   . Left bundle branch block (LBBB)   . Low back pain   . Neuropathy   . NICM (nonischemic cardiomyopathy) ([place])    EF 30%, cath 05/20/2015 clean coronary.  EF resolved by echo with EF 55%  . OSA on CPAP   . Prediabetes   . Pulmonary HTN ([place]) 05/2015   Severe with PASP 67/37mmHg - resolved on followup echo  . PVC's (premature ventricular contractions) 11/04/2015   Past Surgical History:  Procedure Laterality Date  . CARDIAC CATHETERIZATION N/A 05/20/2015   Procedure: Right/Left Heart Cath and Coronary Angiography;  Surgeon: Troy Sine, MD;  Location: [facility];  Service: Cardiovascular;  Laterality: N/A;  . CATARACT EXTRACTION W/ INTRAOCULAR LENS  IMPLANT, BILATERAL    . DILATION AND CURETTAGE OF UTERUS    . lumpectomy left breast    . UTERINE FIBROID SURGERY     Current Outpatient Prescriptions on File Prior to Visit  Medication Sig Dispense Refill  . acetaminophen (TYLENOL) 325 MG tablet Take 650 mg by mouth every 6 (six) hours as needed.    Marland Kitchen aspirin 81 MG tablet Take 81 mg by mouth daily.    . carvedilol (COREG) 6.25 MG tablet Take 1 tablet (6.25 mg total) by mouth 2 (two) times daily with a meal. 180 tablet 1  . furosemide (LASIX) 40 MG tablet TAKE ONE TO TWO TABLETS BY MOUTH DAILY AS DIRECTED 180 tablet 0  . HYDROcodone-acetaminophen (NORCO) 5-325 MG tablet Take 1 tablet by mouth  every 6 (six) hours as needed for moderate pain. 30 tablet 0  . ibuprofen (ADVIL,MOTRIN) 200 MG tablet Take 200 mg by mouth every 6 (six) hours as needed.    . Loratadine 10 MG CAPS Take by mouth daily as needed.     . Multiple Vitamin (MULTIVITAMIN) tablet Take 1 tablet by mouth daily.    . predniSONE (DELTASONE) 20 MG tablet 3 tabs poqday 1-2, 2 tabs poqday 3-4, 1 tab poqday 5-6 12 tablet 0  . sacubitril-valsartan (ENTRESTO) 24-26 MG Take 1 tablet by mouth 2 (two) times daily. 60 tablet 2  . spironolactone (ALDACTONE) 25 MG tablet TAKE ONE-HALF TABLET BY MOUTH ONCE DAILY 45 tablet 3  . vitamin C (ASCORBIC ACID) 500 MG tablet Take 500 mg by mouth as needed.      No current facility-administered medications on file prior to visit.    Allergies  Allergen Reactions  . Atorvastatin     REACTION: mylagias  . Lipitor [Atorvastatin Calcium] Other (See Comments)    myalgia  . Pravachol Other (See Comments)    myalgia  . Pravastatin     REACTION: myalgias  . Pravastatin Sodium     REACTION: myalgias  . Sulfa Antibiotics Hives  . Sulfonamide Derivatives  REACTION: hives   Social History   Social History  . Marital status: Significant Other    Spouse name: N/A  . Number of children: N/A  . Years of education: N/A   Occupational History  . Truck Geophysicist/field seismologist    Social History Main Topics  . Smoking status: Former Smoker    Quit date: 06/18/2009  . Smokeless tobacco: Never Used  . Alcohol use 0.0 oz/week     Comment: once every 2 months  . Drug use: No  . Sexual activity: Yes   Other Topics Concern  . Not on file   Social History Narrative  . No narrative on file      Review of Systems  All other systems reviewed and are negative.      Objective:   Physical Exam  HENT:  Head:    Cardiovascular: Normal rate, regular rhythm and normal heart sounds.   Pulmonary/Chest: Effort normal and breath sounds normal. No respiratory distress. She has no wheezes. She has no rales.   Abdominal: Soft. Bowel sounds are normal.  Neurological: She displays normal reflexes. She exhibits normal muscle tone. Coordination normal.  Vitals reviewed.  3 clumps of vesicles on erythematous bases as diagrammed.  Each plaque is 1 cm in diameter and is tender to the touch       Assessment & Plan:  I suspect shingles. Begin Valtrex 1 g by mouth 3 times a day for 1 week and inform the patient to seek medical attention immediately if she develops a rash around her eye or redness in the eye

## 2016-10-27 ENCOUNTER — Encounter (INDEPENDENT_AMBULATORY_CARE_PROVIDER_SITE_OTHER): Payer: Self-pay

## 2016-10-27 ENCOUNTER — Ambulatory Visit (INDEPENDENT_AMBULATORY_CARE_PROVIDER_SITE_OTHER): Payer: Managed Care, Other (non HMO) | Admitting: Cardiology

## 2016-10-27 ENCOUNTER — Encounter: Payer: Self-pay | Admitting: Cardiology

## 2016-10-27 VITALS — BP 118/70 | HR 73 | Ht 64.5 in | Wt 261.1 lb

## 2016-10-27 DIAGNOSIS — I5032 Chronic diastolic (congestive) heart failure: Secondary | ICD-10-CM

## 2016-10-27 MED ORDER — SPIRONOLACTONE 25 MG PO TABS: 12.5000 mg | ORAL_TABLET | Freq: Every day | ORAL | 3 refills | Status: DC

## 2016-10-27 MED ORDER — FUROSEMIDE 40 MG PO TABS: ORAL_TABLET | ORAL | 3 refills | Status: DC

## 2016-10-27 MED ORDER — CARVEDILOL 6.25 MG PO TABS: 6.2500 mg | ORAL_TABLET | Freq: Two times a day (BID) | ORAL | 3 refills | Status: DC

## 2016-10-27 MED ORDER — SACUBITRIL-VALSARTAN 24-26 MG PO TABS
1.0000 | ORAL_TABLET | Freq: Two times a day (BID) | ORAL | 3 refills | Status: DC
Start: 2016-10-27 — End: 2017-10-09

## 2016-10-27 NOTE — Progress Notes (Signed)
10/27/2016 [name]   07-13-1955  [id number]  Primary Physician Pickard, Cammie Mcgee, MD Primary Cardiologist: Dr. Radford Pax    Reason for Visit/CC: Chronic Systolic HF  HPI:  61 y/o female, followed by Dr. Radford Pax, with a h/o DCM with EF of 45%, HTN, LBBB and obesity. She had a nuclear stress test which showed inferior and anterior wall defects. Subsequent LHC 05/2015 showed normal coronary arteries. EF was 30-35% by cath. PA pressure was elevated c/w severe pulmonary HTN. She had a f/u 2D echo several months later 08/2015 that showed resolution of DCM with EF at 50-55, G1DD, mild MR and normal PAP. However her most recent echo 08/2016 showed mildly reduced EF at 40-45% with mild AS. Dr. Radford Pax recommended repeat echo in 1 year.   She also has a h/o PACs, PVCs, HLD and OSA on CPAP. She reports full compliance with her CPAP. She also notes h/o statin intolerance 2/2 myalgias. She has failed Lipitor, pravastatin, Pravachol and most recently simvastatin. She stopped simvastatin 2 months ago. Her PCP mentioned that she should be considered for PSCK9 inhibitors.  She presents to clinic today for routine f/u. She would like to discuss new therapies for her HLD but also has concerns regarding her weight. 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 She has a treadmill at home but does not use it when there and also notes that a lot of the hotels she stays at, in between trips, have exercise rooms as well but she does no take advantage of the opportunity. She tries to watch what she eats while on the road, avoiding empty calories and bad snacks.  From a heart standpoint, she has been w/o symptoms of dyspnea, orthopnea, PND and LEE. She also denies CP. She reports full med compliance. BP is well controlled at 118/70. She is on Entresto, Coreg, and spironolactone.   No outpatient prescriptions have been marked as taking for the  10/27/16 encounter (Office Visit) with Consuelo Pandy, PA-C.   Allergies  Allergen Reactions  . Atorvastatin     REACTION: mylagias  . Lipitor [Atorvastatin Calcium] Other (See Comments)    myalgia  . Pravachol Other (See Comments)    myalgia  . Pravastatin     REACTION: myalgias  . Pravastatin Sodium     REACTION: myalgias  . Sulfa Antibiotics Hives  . Sulfonamide Derivatives     REACTION: hives   Past Medical History:  Diagnosis Date  . Anxiety   . Aortic stenosis, mild    echo 2017  . Breast cancer ([place])    left breast  . Carpal tunnel syndrome   . Cataract   . Chronic diastolic CHF (congestive heart failure) (HCC)    NYHA class II  . Hyperlipidemia   . Hypertension   . Left bundle branch block (LBBB)   . Low back pain   . Neuropathy   . NICM (nonischemic cardiomyopathy) ([place])    EF 30%, cath 05/20/2015 clean coronary.  EF resolved by echo with EF 55%  . OSA on CPAP   . Prediabetes   . Pulmonary HTN ([place]) 05/2015   Severe with PASP 67/62mmHg - resolved on followup echo  . PVC's (premature ventricular contractions) 11/04/2015   Family History  Problem Relation Age of Onset  . Arthritis Mother   . Depression Mother   . Hearing loss Mother   . Hyperlipidemia Mother   .  Miscarriages / [place] Mother   . Colon cancer Mother 16       2010  . Alcohol abuse Father   . Cancer Father        PROSTATE  . Depression Father   . Hypertension Father   . Hyperlipidemia Father   . Drug abuse Brother   . Early death Maternal Grandmother   . Cancer Paternal Grandmother   . Heart attack Neg Hx    Past Surgical History:  Procedure Laterality Date  . CARDIAC CATHETERIZATION N/A 05/20/2015   Procedure: Right/Left Heart Cath and Coronary Angiography;  Surgeon: Troy Sine, MD;  Location: [facility];  Service: Cardiovascular;  Laterality: N/A;  . CATARACT EXTRACTION W/ INTRAOCULAR LENS  IMPLANT, BILATERAL    . DILATION AND CURETTAGE OF UTERUS    .  lumpectomy left breast    . UTERINE FIBROID SURGERY     Social History   Social History  . Marital status: Significant Other    Spouse name: N/A  . Number of children: N/A  . Years of education: N/A   Occupational History  . Truck Geophysicist/field seismologist    Social History Main Topics  . Smoking status: Former Smoker    Quit date: 06/18/2009  . Smokeless tobacco: Never Used  . Alcohol use 0.0 oz/week     Comment: once every 2 months  . Drug use: No  . Sexual activity: Yes   Other Topics Concern  . Not on file   Social History Narrative  . No narrative on file     Review of Systems: General: negative for chills, fever, night sweats or weight changes.  Cardiovascular: negative for chest pain, dyspnea on exertion, edema, orthopnea, palpitations, paroxysmal nocturnal dyspnea or shortness of breath Dermatological: negative for rash Respiratory: negative for cough or wheezing Urologic: negative for hematuria Abdominal: negative for nausea, vomiting, diarrhea, bright red blood per rectum, melena, or hematemesis Neurologic: negative for visual changes, syncope, or dizziness All other systems reviewed and are otherwise negative except as noted above.   Physical Exam:  Height 5' 4.5" (1.638 m), weight 261 lb 1.9 oz (118.4 kg).  General appearance: alert, cooperative, no distress and morbidly obese Neck: no carotid bruit and no JVD Lungs: clear to auscultation bilaterally Heart: regular rate and rhythm, S1, S2 normal, no murmur, click, rub or gallop Abdomen: soft, non-tender; bowel sounds normal; no masses,  no organomegaly Extremities: extremities normal, atraumatic, no cyanosis or edema Pulses: 2+ and symmetric Skin: Skin color, texture, turgor normal. No rashes or lesions Neurologic: Grossly normal  EKG not performed -- personally reviewed   ASSESSMENT AND PLAN:   1. DCM: EF 40-45% on most recent echo. LHC in 2017 showed normal coronaries. She is stable w/o dyspnea. Euvolemic on exam. BP  and HR stable. Continue Entresto, Coreg and spironolactone. Check CMP today for medication monitoring. We discussed checking weight weekly and to call if >5 lb weight gain in 1 week. Also instructed on low sodium diet.   2. HLD w/ statin Intolerance: Failed Lipitor, simvastatin, pravastatin and Pravachol 2/2 myalgias. Refer to lipid clinic for PSCK9 inhibitors.   3. Aortic Stenosis: mild by recent echo 08/2016. F/u echo in 1 year, per Dr. Radford Pax.   4. OSA: compliant with CPAP.  5. HTN: controlled on current regimen. Check CMP for medication monitoring.   6. Obesity: we had a long discussion regarding the importance of exercise and finding time to work it in to her schedule. After discussion, she seemed  very motivated to make good use of treadmills where available and walking for physical activity. We also discussed heart healthy, low sodium, low carb, low fat diet  Refill of heart meds given.   Follow-Up w/ Dr. Radford Pax in 6 months.  Samarie Pinder Ladoris Gene, MHS [facility] 10/27/2016 3:36 PM

## 2016-10-27 NOTE — Patient Instructions (Signed)
Medication Instructions:  1. REFILLS HAVE BEEN SENT IN FOR YOUR CARDIAC MEDCIATIONS  Labwork: TODAY CMET  Testing/Procedures: NONE ORDERED TODAY  Follow-Up: 1. YOU ARE BEING REFERRED TO THE LIPID CLINIC; PT INTOLERANT TO STATINS; NEED TO DISCUSS PCSK9  2. Your physician wants you to follow-up in: 6  MONTHS WITH DR. Mallie Snooks will receive a reminder letter in the mail two months in advance. If you don't receive a letter, please call our office to schedule the follow-up appointment.   Any Other Special Instructions Will Be Listed Below (If Applicable).     If you need a refill on your cardiac medications before your next appointment, please call your pharmacy.

## 2016-10-28 LAB — COMPREHENSIVE METABOLIC PANEL
ALK PHOS: 66 IU/L (ref 39–117)
ALT: 19 IU/L (ref 0–32)
AST: 15 IU/L (ref 0–40)
Albumin/Globulin Ratio: 1.6 (ref 1.2–2.2)
Albumin: 4.5 g/dL (ref 3.6–4.8)
BUN/Creatinine Ratio: 19 (ref 12–28)
BUN: 14 mg/dL (ref 8–27)
Bilirubin Total: <0.2 mg/dL (ref 0.0–1.2)
CO2: 22 mmol/L (ref 20–29)
CREATININE: 0.75 mg/dL (ref 0.57–1.00)
Calcium: 9.9 mg/dL (ref 8.7–10.3)
Chloride: 102 mmol/L (ref 96–106)
GFR calc Af Amer: 99 mL/min/{1.73_m2} (ref >59)
GFR calc non Af Amer: 86 mL/min/{1.73_m2} (ref >59)
GLOBULIN, TOTAL: 2.9 g/dL (ref 1.5–4.5)
GLUCOSE: 110 mg/dL — AB (ref 65–99)
Potassium: 4.2 mmol/L (ref 3.5–5.2)
SODIUM: 140 mmol/L (ref 134–144)
Total Protein: 7.4 g/dL (ref 6.0–8.5)

## 2016-10-31 ENCOUNTER — Telehealth: Payer: Self-pay | Admitting: Cardiology

## 2016-10-31 NOTE — Telephone Encounter (Signed)
New message     Pt returning call about lab results, would like to speak with some about these results, you may leave message on vm

## 2016-10-31 NOTE — Telephone Encounter (Signed)
Pt aware of lab results ./cy 

## 2016-11-02 LAB — HM MAMMOGRAPHY

## 2016-11-07 ENCOUNTER — Emergency Department (HOSPITAL_COMMUNITY): Payer: Managed Care, Other (non HMO)

## 2016-11-07 ENCOUNTER — Encounter (HOSPITAL_COMMUNITY): Payer: Self-pay | Admitting: Emergency Medicine

## 2016-11-07 ENCOUNTER — Observation Stay (HOSPITAL_COMMUNITY)
Admission: EM | Admit: 2016-11-07 | Discharge: 2016-11-08 | Disposition: A | Discharge status: Home / Self Care | Payer: Managed Care, Other (non HMO) | Attending: Family Medicine | Admitting: Family Medicine

## 2016-11-07 DIAGNOSIS — E785 Hyperlipidemia, unspecified: Secondary | ICD-10-CM | POA: Insufficient documentation

## 2016-11-07 DIAGNOSIS — I110 Hypertensive heart disease with heart failure: Secondary | ICD-10-CM | POA: Diagnosis not present

## 2016-11-07 DIAGNOSIS — R079 Chest pain, unspecified: Secondary | ICD-10-CM | POA: Insufficient documentation

## 2016-11-07 DIAGNOSIS — Z6841 Body Mass Index (BMI) 40.0 and over, adult: Secondary | ICD-10-CM | POA: Insufficient documentation

## 2016-11-07 DIAGNOSIS — Z888 Allergy status to other drugs, medicaments and biological substances status: Secondary | ICD-10-CM | POA: Insufficient documentation

## 2016-11-07 DIAGNOSIS — F419 Anxiety disorder, unspecified: Secondary | ICD-10-CM | POA: Diagnosis not present

## 2016-11-07 DIAGNOSIS — R7303 Prediabetes: Secondary | ICD-10-CM | POA: Diagnosis not present

## 2016-11-07 DIAGNOSIS — J09X2 Influenza due to identified novel influenza A virus with other respiratory manifestations: Secondary | ICD-10-CM | POA: Diagnosis not present

## 2016-11-07 DIAGNOSIS — I5032 Chronic diastolic (congestive) heart failure: Secondary | ICD-10-CM

## 2016-11-07 DIAGNOSIS — Z853 Personal history of malignant neoplasm of breast: Secondary | ICD-10-CM | POA: Diagnosis not present

## 2016-11-07 DIAGNOSIS — J101 Influenza due to other identified influenza virus with other respiratory manifestations: Secondary | ICD-10-CM | POA: Diagnosis not present

## 2016-11-07 DIAGNOSIS — R739 Hyperglycemia, unspecified: Secondary | ICD-10-CM | POA: Insufficient documentation

## 2016-11-07 DIAGNOSIS — I447 Left bundle-branch block, unspecified: Secondary | ICD-10-CM | POA: Diagnosis not present

## 2016-11-07 DIAGNOSIS — I5042 Chronic combined systolic (congestive) and diastolic (congestive) heart failure: Secondary | ICD-10-CM | POA: Diagnosis not present

## 2016-11-07 DIAGNOSIS — R0602 Shortness of breath: Secondary | ICD-10-CM | POA: Diagnosis present

## 2016-11-07 DIAGNOSIS — I272 Pulmonary hypertension, unspecified: Secondary | ICD-10-CM | POA: Diagnosis not present

## 2016-11-07 DIAGNOSIS — I5040 Unspecified combined systolic (congestive) and diastolic (congestive) heart failure: Secondary | ICD-10-CM | POA: Insufficient documentation

## 2016-11-07 DIAGNOSIS — I429 Cardiomyopathy, unspecified: Secondary | ICD-10-CM | POA: Diagnosis not present

## 2016-11-07 DIAGNOSIS — G4733 Obstructive sleep apnea (adult) (pediatric): Secondary | ICD-10-CM | POA: Insufficient documentation

## 2016-11-07 DIAGNOSIS — E119 Type 2 diabetes mellitus without complications: Secondary | ICD-10-CM

## 2016-11-07 LAB — BASIC METABOLIC PANEL
Anion gap: 11 (ref 5–15)
BUN: 11 mg/dL (ref 6–20)
CALCIUM: 9.5 mg/dL (ref 8.9–10.3)
CHLORIDE: 103 mmol/L (ref 101–111)
CO2: 22 mmol/L (ref 22–32)
CREATININE: 0.78 mg/dL (ref 0.44–1.00)
GFR calc non Af Amer: >60 mL/min (ref >60)
GLUCOSE: 140 mg/dL — AB (ref 65–99)
Potassium: 3.8 mmol/L (ref 3.5–5.1)
Sodium: 136 mmol/L (ref 135–145)

## 2016-11-07 LAB — PHOSPHORUS: PHOSPHORUS: 2.9 mg/dL (ref 2.5–4.6)

## 2016-11-07 LAB — URINALYSIS, ROUTINE W REFLEX MICROSCOPIC
Bilirubin Urine: NEGATIVE
GLUCOSE, UA: NEGATIVE mg/dL
HGB URINE DIPSTICK: NEGATIVE
Ketones, ur: NEGATIVE mg/dL
Leukocytes, UA: NEGATIVE
Nitrite: NEGATIVE
PH: 6 (ref 5.0–8.0)
PROTEIN: NEGATIVE mg/dL
Specific Gravity, Urine: 1.02 (ref 1.005–1.030)

## 2016-11-07 LAB — CBC
HCT: 38.6 % (ref 36.0–46.0)
Hemoglobin: 13 g/dL (ref 12.0–15.0)
MCH: 31.2 pg (ref 26.0–34.0)
MCHC: 33.7 g/dL (ref 30.0–36.0)
MCV: 92.6 fL (ref 78.0–100.0)
PLATELETS: 262 10*3/uL (ref 150–400)
RBC: 4.17 MIL/uL (ref 3.87–5.11)
RDW: 14.3 % (ref 11.5–15.5)
WBC: 10.7 10*3/uL — ABNORMAL HIGH (ref 4.0–10.5)

## 2016-11-07 LAB — MAGNESIUM: MAGNESIUM: 1.7 mg/dL (ref 1.7–2.4)

## 2016-11-07 LAB — I-STAT TROPONIN, ED: TROPONIN I, POC: 0 ng/mL (ref 0.00–0.08)

## 2016-11-07 LAB — TROPONIN I
Troponin I: <0.03 ng/mL (ref <0.03)
Troponin I: <0.03 ng/mL (ref <0.03)

## 2016-11-07 LAB — INFLUENZA PANEL BY PCR (TYPE A & B)
Influenza A By PCR: POSITIVE — AB
Influenza B By PCR: NEGATIVE

## 2016-11-07 LAB — BRAIN NATRIURETIC PEPTIDE: B Natriuretic Peptide: 24.6 pg/mL (ref 0.0–100.0)

## 2016-11-07 MED ORDER — ENOXAPARIN SODIUM 40 MG/0.4ML ~~LOC~~ SOLN
40.0000 mg | SUBCUTANEOUS | Status: DC
  Administered 2016-11-07 – 2016-11-08 (×2): 40 mg via SUBCUTANEOUS
  Filled 2016-11-07 (×2): qty 0.4

## 2016-11-07 MED ORDER — OSELTAMIVIR PHOSPHATE 75 MG PO CAPS
75.0000 mg | ORAL_CAPSULE | Freq: Two times a day (BID) | ORAL | Status: DC
  Administered 2016-11-07 – 2016-11-08 (×3): 75 mg via ORAL
  Filled 2016-11-07 (×4): qty 1

## 2016-11-07 MED ORDER — ONDANSETRON 4 MG PO TBDP
4.0000 mg | ORAL_TABLET | Freq: Once | ORAL | Status: DC
  Filled 2016-11-07: qty 1

## 2016-11-07 MED ORDER — SPIRONOLACTONE 25 MG PO TABS
12.5000 mg | ORAL_TABLET | Freq: Every day | ORAL | Status: DC
  Administered 2016-11-07 – 2016-11-08 (×2): 12.5 mg via ORAL
  Filled 2016-11-07 (×2): qty 1

## 2016-11-07 MED ORDER — SACUBITRIL-VALSARTAN 24-26 MG PO TABS
1.0000 | ORAL_TABLET | Freq: Two times a day (BID) | ORAL | Status: DC
  Administered 2016-11-07 – 2016-11-08 (×3): 1 via ORAL
  Filled 2016-11-07 (×5): qty 1

## 2016-11-07 MED ORDER — ONDANSETRON HCL 4 MG/2ML IJ SOLN: 4.0000 mg | Freq: Four times a day (QID) | INTRAMUSCULAR | Status: DC | PRN

## 2016-11-07 MED ORDER — ASPIRIN 81 MG PO TABS: 81.0000 mg | ORAL_TABLET | Freq: Every day | ORAL | Status: DC

## 2016-11-07 MED ORDER — CARVEDILOL 6.25 MG PO TABS
6.2500 mg | ORAL_TABLET | Freq: Two times a day (BID) | ORAL | Status: DC
  Administered 2016-11-08: 6.25 mg via ORAL
  Filled 2016-11-07 (×2): qty 1

## 2016-11-07 MED ORDER — ACETAMINOPHEN 650 MG RE SUPP: 650.0000 mg | Freq: Four times a day (QID) | RECTAL | Status: DC | PRN

## 2016-11-07 MED ORDER — ACETAMINOPHEN 500 MG PO TABS
1000.0000 mg | ORAL_TABLET | Freq: Four times a day (QID) | ORAL | Status: DC | PRN
  Administered 2016-11-07: 1000 mg via ORAL
  Filled 2016-11-07: qty 2

## 2016-11-07 MED ORDER — ASPIRIN 81 MG PO CHEW
81.0000 mg | CHEWABLE_TABLET | Freq: Every day | ORAL | Status: DC
  Administered 2016-11-07 – 2016-11-08 (×2): 81 mg via ORAL
  Filled 2016-11-07 (×2): qty 1

## 2016-11-07 MED ORDER — KETOROLAC TROMETHAMINE 30 MG/ML IJ SOLN
30.0000 mg | Freq: Four times a day (QID) | INTRAMUSCULAR | Status: DC | PRN
  Administered 2016-11-07 – 2016-11-08 (×3): 30 mg via INTRAVENOUS
  Filled 2016-11-07 (×3): qty 1

## 2016-11-07 MED ORDER — ONDANSETRON HCL 4 MG PO TABS
4.0000 mg | ORAL_TABLET | Freq: Once | ORAL | Status: AC
  Administered 2016-11-07: 4 mg via ORAL

## 2016-11-07 MED ORDER — SODIUM CHLORIDE 0.9 % IV BOLUS (SEPSIS)
500.0000 mL | Freq: Once | INTRAVENOUS | Status: AC
  Administered 2016-11-07: 500 mL via INTRAVENOUS

## 2016-11-07 MED ORDER — SODIUM CHLORIDE 0.45 % IV SOLN
INTRAVENOUS | Status: DC
  Administered 2016-11-07 (×2): via INTRAVENOUS
  Administered 2016-11-08: 1000 mL via INTRAVENOUS

## 2016-11-07 MED ORDER — FENTANYL CITRATE (PF) 100 MCG/2ML IJ SOLN
50.0000 ug | Freq: Once | INTRAMUSCULAR | Status: AC
  Administered 2016-11-07: 50 ug via INTRAVENOUS
  Filled 2016-11-07: qty 2

## 2016-11-07 MED ORDER — TRAMADOL HCL 50 MG PO TABS: 50.0000 mg | ORAL_TABLET | Freq: Four times a day (QID) | ORAL | Status: DC | PRN

## 2016-11-07 MED ORDER — ONDANSETRON HCL 4 MG PO TABS
4.0000 mg | ORAL_TABLET | Freq: Four times a day (QID) | ORAL | Status: DC | PRN
  Administered 2016-11-08: 4 mg via ORAL
  Filled 2016-11-07: qty 1

## 2016-11-07 MED ORDER — FUROSEMIDE 20 MG PO TABS: 40.0000 mg | ORAL_TABLET | Freq: Every day | ORAL | Status: DC | PRN

## 2016-11-07 MED ORDER — ONDANSETRON HCL 4 MG/2ML IJ SOLN
4.0000 mg | Freq: Once | INTRAMUSCULAR | Status: AC
  Administered 2016-11-07: 4 mg via INTRAVENOUS
  Filled 2016-11-07: qty 2

## 2016-11-07 MED ORDER — PROMETHAZINE HCL 25 MG/ML IJ SOLN: 12.5000 mg | Freq: Four times a day (QID) | INTRAMUSCULAR | Status: DC | PRN

## 2016-11-07 NOTE — H&P (Signed)
History and Physical    [name] LPF:[id number] DOB: [birth year] DOA: 11/07/2016  PCP: Susy Frizzle, MD Patient coming from: home Chief Complaint: body aches  HPI: [name] is a 61 y.o. female with medical history significant of pulmonary hypertension, prediabetes, OSA on CPAP, nonischemic cardiomyopathy with combined systolic and diastolic congestive heart failure, left bundle branch block, hyperlipidemia, hypertension, left breast cancer, aortic stenosis.  Patient reports 3 day history of worsening URI type symptoms. Started out as a sensation of fullness or drainage in the back of her throat with some mild throat clearing. This progressed to cough with phlegm production that was initially clear to whitish. Cough slowly worsened and other symptoms have come on such as severe body aches, chills, subjective fevers, generalized lethargy and weakness and headache. Patient states she's unable to perform any of her ADLs due to the severity of her symptoms. Tylenol without improvement. Denies chest pain, palpitations, dysuria, frequency, flank pain, neck stiffness, focal neurological deficits.    ED Course: Objective findings outlined below. Zofran and fentanyl given  Review of Systems: As per HPI otherwise all other systems reviewed and are negative  Ambulatory Status: unrestricted  Past Medical History:  Diagnosis Date  . Anxiety   . Aortic stenosis, mild    echo 2017  . Breast cancer ([place])    left breast  . Carpal tunnel syndrome   . Cataract   . Chronic diastolic CHF (congestive heart failure) (HCC)    NYHA class II  . Hyperlipidemia   . Hypertension   . Left bundle branch block (LBBB)   . Low back pain   . Neuropathy   . NICM (nonischemic cardiomyopathy) ([place])    EF 30%, cath 05/20/2015 clean coronary.  EF resolved by echo with EF 55%  . OSA on CPAP   . Prediabetes   . Pulmonary HTN ([place]) 05/2015   Severe with PASP 67/67mmHg - resolved on followup echo  . PVC's  (premature ventricular contractions) 11/04/2015    Past Surgical History:  Procedure Laterality Date  . CARDIAC CATHETERIZATION N/A 05/20/2015   Procedure: Right/Left Heart Cath and Coronary Angiography;  Surgeon: Troy Sine, MD;  Location: [facility];  Service: Cardiovascular;  Laterality: N/A;  . CATARACT EXTRACTION W/ INTRAOCULAR LENS  IMPLANT, BILATERAL    . DILATION AND CURETTAGE OF UTERUS    . lumpectomy left breast    . UTERINE FIBROID SURGERY      Social History   Social History  . Marital status: Significant Other    Spouse name: N/A  . Number of children: N/A  . Years of education: N/A   Occupational History  . Truck Geophysicist/field seismologist    Social History Main Topics  . Smoking status: Former Smoker    Quit date: 06/18/2009  . Smokeless tobacco: Never Used  . Alcohol use 0.0 oz/week     Comment: once every 2 months  . Drug use: No  . Sexual activity: Yes   Other Topics Concern  . Not on file   Social History Narrative  . No narrative on file    Allergies  Allergen Reactions  . Atorvastatin Other (See Comments)    REACTION: mylagias  . Pravastatin Sodium Other (See Comments)    REACTION: myalgias  . Sulfa Antibiotics Hives    Family History  Problem Relation Age of Onset  . Arthritis Mother   . Depression Mother   . Hearing loss Mother   . Hyperlipidemia Mother   .  Miscarriages / [place] Mother   . Colon cancer Mother 21       2010  . Alcohol abuse Father   . Cancer Father        PROSTATE  . Depression Father   . Hypertension Father   . Hyperlipidemia Father   . Drug abuse Brother   . Early death Maternal Grandmother   . Cancer Paternal Grandmother   . Heart attack Neg Hx       Prior to Admission medications   Medication Sig Start Date End Date Taking? Authorizing Provider  acetaminophen (TYLENOL) 325 MG tablet Take 650 mg by mouth every 6 (six) hours as needed.    [provider]  aspirin 81 MG tablet Take 81 mg by mouth daily.     [provider]  carvedilol (COREG) 6.25 MG tablet Take 1 tablet (6.25 mg total) by mouth 2 (two) times daily with a meal. 10/27/16   Rosita Fire, Silas Flood M, PA-C  furosemide (LASIX) 40 MG tablet TAKE ONE TO TWO TABLETS BY MOUTH DAILY AS DIRECTED 10/27/16   Lyda Jester M, PA-C  ibuprofen (ADVIL,MOTRIN) 200 MG tablet Take 200 mg by mouth every 6 (six) hours as needed.    [provider]  Multiple Vitamin (MULTIVITAMIN) tablet Take 1 tablet by mouth daily.    [provider]  sacubitril-valsartan (ENTRESTO) 24-26 MG Take 1 tablet by mouth 2 (two) times daily. 10/27/16   Consuelo Pandy, PA-C  spironolactone (ALDACTONE) 25 MG tablet Take 0.5 tablets (12.5 mg total) by mouth daily. 10/27/16   Lyda Jester M, PA-C  vitamin C (ASCORBIC ACID) 500 MG tablet Take 500 mg by mouth as needed.     [provider]    Physical Exam: Vitals:   11/07/16 1045 11/07/16 1100 11/07/16 1130 11/07/16 1200  BP: 111/61 116/67 (!) 129/95 124/66  Pulse: 98 (!) 101 (!) 101 97  Resp: (!) 28 (!) 27 19 (!) 24  Temp:      TempSrc:      SpO2: 90% 93% 95% 96%     General: Ill-appearing, resting in bed. Eyes:  PERRL, EOMI, normal lids, iris ENT: dry mm, hard of hearing.  Neck:  no LAD, masses or thyromegaly Cardiovascular:  RRR, no m/r/g. No LE edema.  Respiratory:  CTA bilaterally, no w/r/r. Normal respiratory effort. Abdomen:  soft, ntnd, NABS Skin:  no rash or induration seen on limited exam Musculoskeletal:  grossly normal tone BUE/BLE, good ROM, no bony abnormality Psychiatric:  grossly normal mood and affect, speech fluent and appropriate, AOx3 Neurologic:  CN 2-12 grossly intact, moves all extremities in coordinated fashion, sensation intact  Labs on Admission: I have personally reviewed following labs and imaging studies  CBC:  Recent Labs Lab 11/07/16 0640  WBC 10.7*  HGB 13.0  HCT 38.6  MCV 92.6  PLT 195   Basic Metabolic Panel:  Recent  Labs Lab 11/07/16 0640  NA 136  K 3.8  CL 103  CO2 22  GLUCOSE 140*  BUN 11  CREATININE 0.78  CALCIUM 9.5   GFR: Estimated Creatinine Clearance: 94.3 mL/min (by C-G formula based on SCr of 0.78 mg/dL). Liver Function Tests: No results for input(s): AST, ALT, ALKPHOS, BILITOT, PROT, ALBUMIN in the last 168 hours. No results for input(s): LIPASE, AMYLASE in the last 168 hours. No results for input(s): AMMONIA in the last 168 hours. Coagulation Profile: No results for input(s): INR, PROTIME in the last 168 hours. Cardiac Enzymes: No results for input(s): CKTOTAL,  CKMB, CKMBINDEX, TROPONINI in the last 168 hours. BNP (last 3 results) No results for input(s): PROBNP in the last 8760 hours. HbA1C: No results for input(s): HGBA1C in the last 72 hours. CBG: No results for input(s): GLUCAP in the last 168 hours. Lipid Profile: No results for input(s): CHOL, HDL, LDLCALC, TRIG, CHOLHDL, LDLDIRECT in the last 72 hours. Thyroid Function Tests: No results for input(s): TSH, T4TOTAL, FREET4, T3FREE, THYROIDAB in the last 72 hours. Anemia Panel: No results for input(s): VITAMINB12, FOLATE, FERRITIN, TIBC, IRON, RETICCTPCT in the last 72 hours. Urine analysis:    Component Value Date/Time   COLORURINE YELLOW 11/07/2016 1038   APPEARANCEUR CLEAR 11/07/2016 1038   LABSPEC 1.020 11/07/2016 1038   PHURINE 6.0 11/07/2016 1038   GLUCOSEU NEGATIVE 11/07/2016 1038   HGBUR NEGATIVE 11/07/2016 1038   BILIRUBINUR NEGATIVE 11/07/2016 1038   KETONESUR NEGATIVE 11/07/2016 1038   PROTEINUR NEGATIVE 11/07/2016 1038   UROBILINOGEN 0.2 04/08/2012 0858   NITRITE NEGATIVE 11/07/2016 1038   LEUKOCYTESUR NEGATIVE 11/07/2016 1038    Creatinine Clearance: Estimated Creatinine Clearance: 94.3 mL/min (by C-G formula based on SCr of 0.78 mg/dL).  Sepsis Labs: @LABRCNTIP (procalcitonin:4,lacticidven:4) )No results found for this or any previous visit (from the past 240 hour(s)).   Radiological Exams on  Admission: Dg Chest 2 View  Result Date: 11/07/2016 CLINICAL DATA:  Chest pain and shortness of breath EXAM: CHEST  2 VIEW COMPARISON:  11/13/2015 FINDINGS: Cardiopericardial enlargement on the AP view, less prominent on the lateral view, likely due to differences in inflation. There is no edema, consolidation, effusion, or pneumothorax. Stable aortic and hilar contours from prior. Postoperative left breast/axilla. Prominent spondylosis. EKG leads create artifact over the chest. IMPRESSION: No acute finding when compared to prior. Electronically Signed   By: Monte Fantasia M.D.   On: 11/07/2016 07:13    EKG: Independently reviewed. LBBB, PVC, Sinus, tachy - Essentially unchanged from prior  Assessment/Plan Active Problems:   LBBB (left bundle branch block)   Influenza A   Chronic combined systolic (congestive) and diastolic (congestive) heart failure (HCC)   Prediabetes   Influenza A: on day 3 of sx (~60hrs). Pt likely to hit her nadir in the next 24-48hrs. At risk from a cardiovascular standpoint. Pt unable to ambulate due to body aches and weakness.  - Tamiflu - IVF - tylenol, ibuprofen  Chronic systolic and diastolic congestive heart clear: History of nonischemic cardiomyopathy. Echo from 2 months ago showing an EF of 40% and grade 1 diastolic function. Currently no evidence of fluid overload. - Strict I's and O's, daily weights - continue Coreg, Lasix PRN, Spironolactone - Continue Entresto in am - Tele, cycle trop  HTN: low to normotensive.  - continue coreg in pm - Continue  Entresto in am once pt is hydrated and bp improves.   Hyperglycemia: H/o pre-DM. Glucose 140 on admission. Likely from stress - A1c  DVT prophylaxis: Lovenox  Code Status: full  Family Communication: sisters  Disposition Plan: pending improvement  Consults called: none  Admission status: observation    Waldemar Dickens MD [facility]  If 7PM-7AM, please contact  night-coverage [email or web address] Password [facility]  11/07/2016, 12:29 PM

## 2016-11-07 NOTE — ED Triage Notes (Signed)
Patient from home, with shortness of breath and chest pain.  The chest pain is a tightness that goes across her chest.  Patient BS clear on left, diminished on right.  Patient has a cough and productive cough bringing up brown phlegm.  History of CHF.

## 2016-11-07 NOTE — ED Notes (Signed)
Admitting MD at the bedside.  

## 2016-11-07 NOTE — ED Notes (Signed)
Attempted to call report

## 2016-11-07 NOTE — ED Notes (Signed)
Meal tray ordered for patient.

## 2016-11-07 NOTE — ED Provider Notes (Signed)
[facility] DEPT Provider Note   CSN: [id number] Arrival date & time: 11/07/16  4656     History   Chief Complaint Chief Complaint  Patient presents with  . Chest Pain    HPI [name] is a 61 y.o. female.  HPI  Per Chart Review at her most recent [facility] office visit by Lyda Jester (10/27/2016)  Ms Stalnaker is 61 y/o female, followed by Dr. Radford Pax, with a h/o DCM with EF of 45%, HTN, LBBB and obesity. She had a nuclear stress test which showed inferior and anterior wall defects. Subsequent LHC 05/2015 showed normal coronary arteries. EF was 30-35% by cath. PA pressure was elevated c/w severe pulmonary HTN. She had a f/u 2D echo several months later 08/2015 that showed resolution of DCM with EF at 50-55, G1DD, mild MR and normal PAP. However her most recent echo 08/2016 showed mildly reduced EF at 40-45% with mild AS. Dr. Radford Pax recommended repeat echo in 1 year.   She also has a h/o PACs, PVCs, HLD and OSA on CPAP. She reports full compliance with her CPAP. She also notes h/o statin intolerance 2/2 myalgias. She has failed Lipitor, pravastatin, Pravachol and most recently simvastatin. She stopped simvastatin 2 months ago. Her PCP mentioned that she should be considered for PSCK9 inhibitors. She was seen for routine follow-up and concerns regarding her weight. She had been without symptoms of CP, dyspnea, PND, and LEE. BP well controlled at 118/70. She takes [employer] and [employer].    She comes to the ER today for not feeling well. She has body aches all over, weakness to her lower extremities- she can walk but has to hold on to something to ambulate because otherwise she may fall. She has been having coughing, productive. She back pain and a constant 4/10 throbbing headache across her forehead. She cannot lay still because she is so uncomfortable and crying. She said Tylenol has not been helping and she is also nauseous.   She has had some chest tightness across her chest  but denies chest pain has not taken any nitroglycerin at home.     Past Medical History:  Diagnosis Date  . Anxiety   . Aortic stenosis, mild    echo 2017  . Breast cancer ([place])    left breast  . Carpal tunnel syndrome   . Cataract   . Chronic diastolic CHF (congestive heart failure) (HCC)    NYHA class II  . Hyperlipidemia   . Hypertension   . Left bundle branch block (LBBB)   . Low back pain   . Neuropathy   . NICM (nonischemic cardiomyopathy) ([place])    EF 30%, cath 05/20/2015 clean coronary.  EF resolved by echo with EF 55%  . OSA on CPAP   . Prediabetes   . Pulmonary HTN ([place]) 05/2015   Severe with PASP 67/61mmHg - resolved on followup echo  . PVC's (premature ventricular contractions) 11/04/2015    Patient Active Problem List   Diagnosis Date Noted  . PVC's (premature ventricular contractions) 11/04/2015  . Aortic stenosis, mild   . Heart palpitations 07/28/2015  . Morbid obesity ([place]) 05/23/2015  . Hypertensive heart disease   . Nonischemic cardiomyopathy ([place])   . NSVT (nonsustained ventricular tachycardia) ([place]) 05/21/2015  . Chronic diastolic heart failure ([place]) 05/20/2015  . OSA (obstructive sleep apnea) 01/02/2014  . LBBB (left bundle branch block) 01/02/2014  . Mixed hyperlipidemia 04/15/2010  . Personal history of malignant neoplasm of breast 04/15/2010  Past Surgical History:  Procedure Laterality Date  . CARDIAC CATHETERIZATION N/A 05/20/2015   Procedure: Right/Left Heart Cath and Coronary Angiography;  Surgeon: Troy Sine, MD;  Location: [facility];  Service: Cardiovascular;  Laterality: N/A;  . CATARACT EXTRACTION W/ INTRAOCULAR LENS  IMPLANT, BILATERAL    . DILATION AND CURETTAGE OF UTERUS    . lumpectomy left breast    . UTERINE FIBROID SURGERY      OB History    No data available       Home Medications    Prior to Admission medications   Medication Sig Start Date End Date Taking? Authorizing Provider  acetaminophen  (TYLENOL) 325 MG tablet Take 650 mg by mouth every 6 (six) hours as needed.    [provider]  aspirin 81 MG tablet Take 81 mg by mouth daily.    [provider]  carvedilol (COREG) 6.25 MG tablet Take 1 tablet (6.25 mg total) by mouth 2 (two) times daily with a meal. 10/27/16   Rosita Fire, Silas Flood M, PA-C  furosemide (LASIX) 40 MG tablet TAKE ONE TO TWO TABLETS BY MOUTH DAILY AS DIRECTED 10/27/16   Lyda Jester M, PA-C  ibuprofen (ADVIL,MOTRIN) 200 MG tablet Take 200 mg by mouth every 6 (six) hours as needed.    [provider]  Multiple Vitamin (MULTIVITAMIN) tablet Take 1 tablet by mouth daily.    [provider]  sacubitril-valsartan (ENTRESTO) 24-26 MG Take 1 tablet by mouth 2 (two) times daily. 10/27/16   Consuelo Pandy, PA-C  spironolactone (ALDACTONE) 25 MG tablet Take 0.5 tablets (12.5 mg total) by mouth daily. 10/27/16   Lyda Jester M, PA-C  vitamin C (ASCORBIC ACID) 500 MG tablet Take 500 mg by mouth as needed.     [provider]    Family History Family History  Problem Relation Age of Onset  . Arthritis Mother   . Depression Mother   . Hearing loss Mother   . Hyperlipidemia Mother   . Miscarriages / [place] Mother   . Colon cancer Mother 75       2010  . Alcohol abuse Father   . Cancer Father        PROSTATE  . Depression Father   . Hypertension Father   . Hyperlipidemia Father   . Drug abuse Brother   . Early death Maternal Grandmother   . Cancer Paternal Grandmother   . Heart attack Neg Hx     Social History Social History  Substance Use Topics  . Smoking status: Former Smoker    Quit date: 06/18/2009  . Smokeless tobacco: Never Used  . Alcohol use 0.0 oz/week     Comment: once every 2 months     Allergies   Atorvastatin; Lipitor [atorvastatin calcium]; Pravachol; Pravastatin; Pravastatin sodium; Sulfa antibiotics; and Sulfonamide derivatives   Review of Systems Review of Systems Negative  ROS aside from pertinent positives and negatives as listed in HPI   Physical Exam Updated Vital Signs BP 116/67   Pulse (!) 101   Temp 98.6 F (37 C) (Oral)   Resp (!) 27   SpO2 93%   Physical Exam  Constitutional: She appears well-developed and well-nourished. She appears distressed (crying and uncomfortable).  Tearful, appears dry and flushed  HENT:  Head: Normocephalic and atraumatic.  Eyes: Pupils are equal, round, and reactive to light. Conjunctivae are normal.  Neck: Trachea normal, normal range of motion and full passive range of motion without pain. Neck supple.  Cardiovascular: Normal rate, regular rhythm and normal pulses.   Pulmonary/Chest: Effort normal and breath sounds normal. Chest wall is not dull to percussion. She exhibits no tenderness, no crepitus, no edema, no deformity and no retraction.  Cough on exam  Abdominal: Soft. Normal appearance and bowel sounds are normal.  Musculoskeletal: Normal range of motion.  Tender to the touch of all of her extremities Mild lower extremity swelling  Neurological: She is alert. She has normal strength.  Skin: Skin is warm, dry and intact.  Psychiatric: She has a normal mood and affect. Her speech is normal and behavior is normal. Judgment and thought content normal. Cognition and memory are normal.    ED Treatments / Results  Labs (all labs ordered are listed, but only abnormal results are displayed) Labs Reviewed  BASIC METABOLIC PANEL - Abnormal; Notable for the following:       Result Value   Glucose, Bld 140 (*)    All other components within normal limits  CBC - Abnormal; Notable for the following:    WBC 10.7 (*)    All other components within normal limits  INFLUENZA PANEL BY PCR (TYPE A & B) - Abnormal; Notable for the following:    Influenza A By PCR POSITIVE (*)    All other components within normal limits  BRAIN NATRIURETIC PEPTIDE  URINALYSIS, ROUTINE W REFLEX MICROSCOPIC  I-STAT TROPONIN, ED     EKG  EKG Interpretation  Date/Time:  Monday November 07 2016 06:45:24 EDT Ventricular Rate:  100 PR Interval:    QRS Duration: 133 QT Interval:  347 QTC Calculation: 448 R Axis:   71 Text Interpretation:  Sinus tachycardia Ventricular premature complex RAE, consider biatrial enlargement IVCD, consider atypical LBBB later t wave changes increased since last tracing Confirmed by Dorie Rank [phone]) on 11/07/2016 7:46:08 AM       Radiology Dg Chest 2 View  Result Date: 11/07/2016 CLINICAL DATA:  Chest pain and shortness of breath EXAM: CHEST  2 VIEW COMPARISON:  11/13/2015 FINDINGS: Cardiopericardial enlargement on the AP view, less prominent on the lateral view, likely due to differences in inflation. There is no edema, consolidation, effusion, or pneumothorax. Stable aortic and hilar contours from prior. Postoperative left breast/axilla. Prominent spondylosis. EKG leads create artifact over the chest. IMPRESSION: No acute finding when compared to prior. Electronically Signed   By: Monte Fantasia M.D.   On: 11/07/2016 07:13    Procedures Procedures (including critical care time)  Medications Ordered in ED Medications  ondansetron (ZOFRAN) injection 4 mg (4 mg Intravenous Given 11/07/16 0753)  ondansetron (ZOFRAN) tablet 4 mg (4 mg Oral Given 11/07/16 0729)  fentaNYL (SUBLIMAZE) injection 50 mcg (50 mcg Intravenous Given 11/07/16 0756)     Initial Impression / Assessment and Plan / ED Course  I have reviewed the triage vital signs and the nursing notes.  Pertinent labs & imaging results that were available during my care of the patient were reviewed by me and considered in my medical decision making (see chart for details).  Clinical Course as of Nov 07 1133  Mon Nov 07, 2016  1132 SpO2: 90 % [TG]    Clinical Course User Index [TG] Delos Haring, PA-C   Patients symptoms do not appear to be cardiac in nature. Her CMP is unremarkable. Negative Troponin, CBC is wnl. CBG is  140. Chest xray with cardiopericarditis enlargement. No acute findings. No acute changes to EKG showing an old LBBB,sinus tachycardia, PVC,s, bi atrial enlargement, IVCD.  11:31 am: The patient has negative urinalysis. Negative troponin, normal BNP,  BMP is unremarkable. She is tachycardiac and tachypneic. She got up to use the restroom and required a significant amount of assistance to use the restroom. Her oxygen saturation at rest 90%, will order her to ambulate on room air to assess O2 status.   I spoke with Shanon Brow with [facility] who has agreed for observation.  Final Clinical Impressions(s) / ED Diagnoses   Final diagnoses:  Influenza A    New Prescriptions New Prescriptions   No medications on file     Delos Haring, Hershal Coria 11/07/16 1136    Delos Haring, PA-C 78/24/23 [id number]    Delora Fuel, MD 44/31/54 [phone]

## 2016-11-07 NOTE — ED Notes (Signed)
MD Marily Memos made aware of patient's VS. Reported to give Tylenol and Fluids and to recheck.

## 2016-11-07 NOTE — ED Notes (Signed)
Patient transported to X-ray 

## 2016-11-07 NOTE — ED Notes (Signed)
While ambulating Pt's O2 dropped from 98% to 94%. No complaints of SOB. MD Merrell notified.

## 2016-11-07 NOTE — ED Provider Notes (Signed)
Pt presents with myalgias, chest pain, shortness of breath.  Labs notable for positive influenza.  Suprisingly early in the season.  With her comorbidities, admit for further treatment.  Medical screening examination/treatment/procedure(s) were conducted as a shared visit with non-physician practitioner(s) and myself.  I personally evaluated the patient during the encounter.   EKG Interpretation  Date/Time:  Monday November 07 2016 12:02:54 EDT Ventricular Rate:  100 PR Interval:    QRS Duration: 141 QT Interval:  356 QTC Calculation: 460 R Axis:   37 Text Interpretation:  Sinus tachycardia Ventricular premature complex Right atrial enlargement Left bundle branch block No significant change since last tracing Confirmed by Orpah Greek [phone]) on 11/08/2016 7:55:43 AM         Dorie Rank, MD 11/08/16 2214

## 2016-11-08 DIAGNOSIS — J101 Influenza due to other identified influenza virus with other respiratory manifestations: Secondary | ICD-10-CM

## 2016-11-08 DIAGNOSIS — I5042 Chronic combined systolic (congestive) and diastolic (congestive) heart failure: Secondary | ICD-10-CM | POA: Diagnosis not present

## 2016-11-08 LAB — COMPREHENSIVE METABOLIC PANEL
ALBUMIN: 3.1 g/dL — AB (ref 3.5–5.0)
ALK PHOS: 41 U/L (ref 38–126)
ALT: 19 U/L (ref 14–54)
ANION GAP: 5 (ref 5–15)
AST: 16 U/L (ref 15–41)
BILIRUBIN TOTAL: 0.6 mg/dL (ref 0.3–1.2)
BUN: 15 mg/dL (ref 6–20)
CALCIUM: 8.8 mg/dL — AB (ref 8.9–10.3)
CO2: 24 mmol/L (ref 22–32)
CREATININE: 0.89 mg/dL (ref 0.44–1.00)
Chloride: 102 mmol/L (ref 101–111)
GLUCOSE: 122 mg/dL — AB (ref 65–99)
Potassium: 3.6 mmol/L (ref 3.5–5.1)
Sodium: 131 mmol/L — ABNORMAL LOW (ref 135–145)
TOTAL PROTEIN: 6.1 g/dL — AB (ref 6.5–8.1)

## 2016-11-08 LAB — CBC
HEMATOCRIT: 35.5 % — AB (ref 36.0–46.0)
Hemoglobin: 11.7 g/dL — ABNORMAL LOW (ref 12.0–15.0)
MCH: 30.6 pg (ref 26.0–34.0)
MCHC: 33 g/dL (ref 30.0–36.0)
MCV: 92.9 fL (ref 78.0–100.0)
Platelets: 235 10*3/uL (ref 150–400)
RBC: 3.82 MIL/uL — ABNORMAL LOW (ref 3.87–5.11)
RDW: 14.6 % (ref 11.5–15.5)
WBC: 6.4 10*3/uL (ref 4.0–10.5)

## 2016-11-08 LAB — TROPONIN I: Troponin I: <0.03 ng/mL (ref <0.03)

## 2016-11-08 LAB — HIV ANTIBODY (ROUTINE TESTING W REFLEX): HIV SCREEN 4TH GENERATION: NONREACTIVE

## 2016-11-08 MED ORDER — TRAMADOL HCL 50 MG PO TABS: 50.0000 mg | ORAL_TABLET | Freq: Four times a day (QID) | ORAL | 0 refills | Status: DC | PRN

## 2016-11-08 MED ORDER — OSELTAMIVIR PHOSPHATE 75 MG PO CAPS: 75.0000 mg | ORAL_CAPSULE | Freq: Two times a day (BID) | ORAL | 0 refills | Status: AC

## 2016-11-08 NOTE — Discharge Summary (Signed)
Physician Discharge Summary  [name]  JOA:[id number]  DOB: 06-Jun-1955  DOA: 11/07/2016 PCP: Susy Frizzle, MD  Admit date: 11/07/2016 Discharge date: 11/08/2016  Admitted From: Home  Disposition:  Home   Recommendations for Outpatient Follow-up:  1. Follow up with PCP in 1-2 weeks 2. Please obtain BMP/CBC in one week to check sodium and Hgb   Home Health: recommending PT - patient declining   Discharge Condition: Improved CODE STATUS: Full code Diet recommendation: Heart Healthy  Brief/Interim Summary: 61 year old female with medical history significant for pulmonary hypertension, nonischemic cardiomyopathy, combining systolic and diastolic heart failure, hyperlipidemia, left breast cancer, heart stenosis presented with history of URI symptoms. Upon ED evaluation she found to be positive for Influenza A, she was unable to ambulate due to body aches,therefore she was place in observation for IV hydration and pain control. Patient was started on Tamiflu. Patient subsequently improved and she was discharge home with supportive treatment and Tamiflu. Patient was advised to follow up with PCP in 1 week.  Subjective: Patient seen and examined, still feeling malaise. C/o headaches. Ambulated with PT. Afebrile and tolerating diet well.   Discharge Diagnoses/Hospital Course:  Influenza A - admitted due to comorbidities,and unable to ambulate.  Patient was started in Tamiflu, IVF and tylenol.  She was evaluated by PT and recommended HH PT  Encourage oral hydration  Continue Tamiflu for total of 5 days Hand hygiene   Chronic diastolic and systolic CHF  Seem to be compensated during hospital stay  Patient was continued on home medication without any changes   HTN  BP was stable during hospital stay  Patient was continued on home medications with no changes  Follow up with PCP    Hyperglycemia - 140 upon admission  Recommend A1C monitoring as outpatient Felt to be stress  related   All other chronic medical condition were stable during the hospitalization.  Patient was seen by physical therapy, recommending HH PT On the day of the discharge the patient's vitals were stable, and no other acute medical condition were reported by patient. the patient was felt safe to be discharge to home   Discharge Instructions  You were cared for by a hospitalist during your hospital stay. If you have any questions about your discharge medications or the care you received while you were in the hospital after you are discharged, you can call the unit and asked to speak with the hospitalist on call if the hospitalist that took care of you is not available. Once you are discharged, your primary care physician will handle any further medical issues. Please note that NO REFILLS for any discharge medications will be authorized once you are discharged, as it is imperative that you return to your primary care physician (or establish a relationship with a primary care physician if you do not have one) for your aftercare needs so that they can reassess your need for medications and monitor your lab values.  Discharge Instructions    Call MD for:  difficulty breathing, headache or visual disturbances    Complete by:  As directed    Call MD for:  extreme fatigue    Complete by:  As directed    Call MD for:  hives    Complete by:  As directed    Call MD for:  persistant dizziness or light-headedness    Complete by:  As directed    Call MD for:  persistant nausea and vomiting    Complete by:  As directed    Call MD for:  redness, tenderness, or signs of infection (pain, swelling, redness, odor or green/yellow discharge around incision site)    Complete by:  As directed    Call MD for:  severe uncontrolled pain    Complete by:  As directed    Call MD for:  temperature >100.4    Complete by:  As directed    Diet - low sodium heart healthy    Complete by:  As directed    Increase activity  slowly    Complete by:  As directed      Allergies as of 11/08/2016      Reactions   Atorvastatin Other (See Comments)   REACTION: mylagias   Pravastatin Sodium Other (See Comments)   REACTION: myalgias   Sulfa Antibiotics Hives      Medication List    TAKE these medications   ASHWAGANDHA PO Take 1 tablet by mouth daily.   aspirin 81 MG tablet Take 81 mg by mouth daily.   carvedilol 6.25 MG tablet Commonly known as:  COREG Take 1 tablet (6.25 mg total) by mouth 2 (two) times daily with a meal.   cholecalciferol 1000 units tablet Commonly known as:  VITAMIN D Take 1,000 Units by mouth daily.   furosemide 40 MG tablet Commonly known as:  LASIX TAKE ONE TO TWO TABLETS BY MOUTH DAILY AS DIRECTED   multivitamin tablet Take 1 tablet by mouth daily.   oseltamivir 75 MG capsule Commonly known as:  TAMIFLU Take 1 capsule (75 mg total) by mouth 2 (two) times daily.   sacubitril-valsartan 24-26 MG Commonly known as:  ENTRESTO Take 1 tablet by mouth 2 (two) times daily.   spironolactone 25 MG tablet Commonly known as:  ALDACTONE Take 0.5 tablets (12.5 mg total) by mouth daily.   traMADol 50 MG tablet Commonly known as:  ULTRAM Take 1 tablet (50 mg total) by mouth every 6 (six) hours as needed for moderate pain.   TYLENOL 325 MG tablet Generic drug:  acetaminophen Take 650 mg by mouth every 6 (six) hours as needed for mild pain.   vitamin C 500 MG tablet Commonly known as:  ASCORBIC ACID Take 500 mg by mouth as needed.            Discharge Care Instructions        Start     Ordered   11/08/16 0000  oseltamivir (TAMIFLU) 75 MG capsule  2 times daily     11/08/16 1329   11/08/16 0000  traMADol (ULTRAM) 50 MG tablet  Every 6 hours PRN     11/08/16 1329   11/08/16 0000  Increase activity slowly     11/08/16 1329   11/08/16 0000  Diet - low sodium heart healthy     11/08/16 1329   11/08/16 0000  Call MD for:  temperature >100.4     11/08/16 1329    11/08/16 0000  Call MD for:  persistant nausea and vomiting     11/08/16 1329   11/08/16 0000  Call MD for:  severe uncontrolled pain     11/08/16 1329   11/08/16 0000  Call MD for:  redness, tenderness, or signs of infection (pain, swelling, redness, odor or green/yellow discharge around incision site)     11/08/16 1329   11/08/16 0000  Call MD for:  difficulty breathing, headache or visual disturbances     11/08/16 1329   11/08/16 0000  Call MD for:  hives     11/08/16 1329   11/08/16 0000  Call MD for:  persistant dizziness or light-headedness     11/08/16 1329   11/08/16 0000  Call MD for:  extreme fatigue     11/08/16 1329     Follow-up Information    Susy Frizzle, MD. Schedule an appointment as soon as possible for a visit in 2 week(s).   Specialty:  Family Medicine Why:  Hospital follow up  Contact information: [address] [place] [address] [phone]          Allergies  Allergen Reactions  . Atorvastatin Other (See Comments)    REACTION: mylagias  . Pravastatin Sodium Other (See Comments)    REACTION: myalgias  . Sulfa Antibiotics Hives    Consultations:  None   Procedures/Studies: Dg Chest 2 View  Result Date: 11/07/2016 CLINICAL DATA:  Chest pain and shortness of breath EXAM: CHEST  2 VIEW COMPARISON:  11/13/2015 FINDINGS: Cardiopericardial enlargement on the AP view, less prominent on the lateral view, likely due to differences in inflation. There is no edema, consolidation, effusion, or pneumothorax. Stable aortic and hilar contours from prior. Postoperative left breast/axilla. Prominent spondylosis. EKG leads create artifact over the chest. IMPRESSION: No acute finding when compared to prior. Electronically Signed   By: Monte Fantasia M.D.   On: 11/07/2016 07:13    Discharge Exam: Vitals:   11/07/16 2054 11/08/16 0507  BP: 113/60 117/73  Pulse: 81 79  Resp: 18 18  Temp: 98.5 F (36.9 C) 98.1 F (36.7 C)  SpO2: 99% 94%    Vitals:   11/07/16 1901 11/07/16 1942 11/07/16 2054 11/08/16 0507  BP: 107/68 106/60 113/60 117/73  Pulse: 90 94 81 79  Resp: (!) 24 18 18 18   Temp:   98.5 F (36.9 C) 98.1 F (36.7 C)  TempSrc:   Oral Oral  SpO2: 98% 97% 99% 94%  Weight:    118.4 kg (261 lb 1.6 oz)  Height:   5\' 4"  (1.626 m)     General: Pt is alert, awake, not in acute distress Cardiovascular: RRR, S1/S2 +, no rubs, no gallops Respiratory: CTA bilaterally, no wheezing, no rhonchi Abdominal: Soft, NT, ND, bowel sounds + Extremities: no edema, no cyanosis    The results of significant diagnostics from this hospitalization (including imaging, microbiology, ancillary and laboratory) are listed below for reference.     Microbiology: No results found for this or any previous visit (from the past 240 hour(s)).   Labs: BNP (last 3 results)  Recent Labs  11/07/16 0640  BNP 28.4   Basic Metabolic Panel:  Recent Labs Lab 11/07/16 0640 11/07/16 1221 11/08/16 0349  NA 136  --  131*  K 3.8  --  3.6  CL 103  --  102  CO2 22  --  24  GLUCOSE 140*  --  122*  BUN 11  --  15  CREATININE 0.78  --  0.89  CALCIUM 9.5  --  8.8*  MG  --  1.7  --   PHOS  --  2.9  --    Liver Function Tests:  Recent Labs Lab 11/08/16 0349  AST 16  ALT 19  ALKPHOS 41  BILITOT 0.6  PROT 6.1*  ALBUMIN 3.1*   No results for input(s): LIPASE, AMYLASE in the last 168 hours. No results for input(s): AMMONIA in the last 168 hours. CBC:  Recent Labs Lab 11/07/16 0640 11/08/16 0349  WBC 10.7*  6.4  HGB 13.0 11.7*  HCT 38.6 35.5*  MCV 92.6 92.9  PLT 262 235   Cardiac Enzymes:  Recent Labs Lab 11/07/16 1221 11/07/16 2122 11/08/16 0349  TROPONINI <0.03 <0.03 <0.03   BNP: Invalid input(s): POCBNP CBG: No results for input(s): GLUCAP in the last 168 hours. D-Dimer No results for input(s): DDIMER in the last 72 hours. Hgb A1c No results for input(s): HGBA1C in the last 72 hours. Lipid Profile No results  for input(s): CHOL, HDL, LDLCALC, TRIG, CHOLHDL, LDLDIRECT in the last 72 hours. Thyroid function studies No results for input(s): TSH, T4TOTAL, T3FREE, THYROIDAB in the last 72 hours.  Invalid input(s): FREET3 Anemia work up No results for input(s): VITAMINB12, FOLATE, FERRITIN, TIBC, IRON, RETICCTPCT in the last 72 hours. Urinalysis    Component Value Date/Time   COLORURINE YELLOW 11/07/2016 1038   APPEARANCEUR CLEAR 11/07/2016 1038   LABSPEC 1.020 11/07/2016 1038   PHURINE 6.0 11/07/2016 1038   GLUCOSEU NEGATIVE 11/07/2016 1038   HGBUR NEGATIVE 11/07/2016 1038   BILIRUBINUR NEGATIVE 11/07/2016 1038   KETONESUR NEGATIVE 11/07/2016 1038   PROTEINUR NEGATIVE 11/07/2016 1038   UROBILINOGEN 0.2 04/08/2012 0858   NITRITE NEGATIVE 11/07/2016 1038   LEUKOCYTESUR NEGATIVE 11/07/2016 1038   Sepsis Labs Invalid input(s): PROCALCITONIN,  WBC,  LACTICIDVEN Microbiology No results found for this or any previous visit (from the past 240 hour(s)).   Time coordinating discharge:  30 minutes  SIGNED:  Chipper Oman, MD  [facility] 11/08/2016, 1:31 PM  Pager please text page via  [email or web address] Password [email or web address]

## 2016-11-08 NOTE — Progress Notes (Addendum)
Patient was discharged home by MD order; discharged instructions  review and give to patient with care notes and prescriptions; IV DIC; patient will be escorted to the car by nurse via wheelchair.

## 2016-11-08 NOTE — Discharge Instructions (Signed)
I

## 2016-11-08 NOTE — Progress Notes (Signed)
Pt arrived floor in the presence of partner and ED technician. Pt was assessed to be AxOx4. Vital signs were stable. Cardiac telemetry was initiated. Skin assessment was completed and pt education on call bell was performed. Will continue to monitor .

## 2016-11-08 NOTE — Evaluation (Signed)
Physical Therapy Evaluation Patient Details Name: [name] MRN: [id number] DOB: 01/16/1956 Today's Date: 11/08/2016   History of Present Illness  [name] is a 61 y.o. female with medical history significant of pulmonary hypertension, prediabetes, OSA on CPAP, nonischemic cardiomyopathy with combined systolic and diastolic congestive heart failure, left bundle branch block, hyperlipidemia, hypertension, left breast cancer, aortic stenosis.  Admitted with chills, fever and cough.  Positive for Influenza A.   Clinical Impression  Patient presents with decreased independence with mobility due to weakness, aches, decreased balance, decreased activity tolerance and will benefit from skilled PT in the acute setting to allow return home with assist and follow up HHPT.     Follow Up Recommendations Home health PT;Supervision - Intermittent    Equipment Recommendations  None recommended by PT    Recommendations for Other Services       Precautions / Restrictions Precautions Precautions: Fall Restrictions Weight Bearing Restrictions: No      Mobility  Bed Mobility Overal bed mobility: Needs Assistance Bed Mobility: Supine to Sit     Supine to sit: HOB elevated;Min assist     General bed mobility comments: increased time and effort, pulls up on my hand  Transfers Overall transfer level: Needs assistance Equipment used: None Transfers: Sit to/from Stand Sit to Stand: Min guard         General transfer comment: to steady  Ambulation/Gait Ambulation/Gait assistance: Min assist Ambulation Distance (Feet): 160 Feet Assistive device: 1 person hand held assist (intermittent wall rail) Gait Pattern/deviations: Step-through pattern;Decreased stride length;Trunk flexed;Wide base of support     General Gait Details: slow and weak, stopped to rest x 1 in hallway, HR 87, SpO2 92%  Stairs            Wheelchair Mobility    Modified Rankin (Stroke Patients Only)        Balance Overall balance assessment: Needs assistance   Sitting balance-Leahy Scale: Good       Standing balance-Leahy Scale: Fair Standing balance comment: static standing without UE assist, with gait needs UE support                             Pertinent Vitals/Pain Pain Assessment: 0-10 Pain Score: 3  Pain Location: side Pain Descriptors / Indicators: Aching Pain Intervention(s): Monitored during session    Home Living Family/patient expects to be discharged to:: Private residence Living Arrangements: Spouse/significant other Available Help at Discharge: Family Type of Home: House Home Access: Stairs to enter Entrance Stairs-Rails: Psychiatric nurse of Steps: 4; can access home from alternate entrance without steps Home Layout: Two level;Able to live on main level with bedroom/bathroom Home Equipment: Grab bars - tub/shower;Walker - 2 wheels;Bedside commode;Cane - single point      Prior Function Level of Independence: Independent         Comments: Drives bus for [employer]     Hand Dominance   Dominant Hand: Left    Extremity/Trunk Assessment   Upper Extremity Assessment Upper Extremity Assessment: Overall WFL for tasks assessed    Lower Extremity Assessment Lower Extremity Assessment: Generalized weakness    Cervical / Trunk Assessment Cervical / Trunk Assessment: Kyphotic  Communication   Communication: No difficulties  Cognition Arousal/Alertness: Awake/alert Behavior During Therapy: WFL for tasks assessed/performed Overall Cognitive Status: Within Functional Limits for tasks assessed  General Comments General comments (skin integrity, edema, etc.): partner in room and answering lot of questions due to pt [facility]    Exercises     Assessment/Plan    PT Assessment Patient needs continued PT services  PT Problem List Decreased strength;Decreased  mobility;Decreased activity tolerance;Decreased balance;Decreased knowledge of use of DME       PT Treatment Interventions DME instruction;Gait training;Therapeutic exercise;Patient/family education;Functional mobility training;Balance training;Therapeutic activities    PT Goals (Current goals can be found in the Care Plan section)  Acute Rehab PT Goals Patient Stated Goal: To feel better PT Goal Formulation: With patient/family Time For Goal Achievement: 11/15/16 Potential to Achieve Goals: Good    Frequency Min 3X/week   Barriers to discharge        Co-evaluation               AM-PAC PT "6 Clicks" Daily Activity  Outcome Measure Difficulty turning over in bed (including adjusting bedclothes, sheets and blankets)?: A Little Difficulty moving from lying on back to sitting on the side of the bed? : A Little Difficulty sitting down on and standing up from a chair with arms (e.g., wheelchair, bedside commode, etc,.)?: Unable Help needed moving to and from a bed to chair (including a wheelchair)?: A Little Help needed walking in hospital room?: A Little Help needed climbing 3-5 steps with a railing? : A Little 6 Click Score: 16    End of Session Equipment Utilized During Treatment: Gait belt Activity Tolerance: Patient limited by fatigue Patient left: in chair;with call bell/phone within reach;with family/visitor present   PT Visit Diagnosis: Muscle weakness (generalized) (M62.81);Other abnormalities of gait and mobility (R26.89);Unsteadiness on feet (R26.81)    Time: 0981-1914 PT Time Calculation (min) (ACUTE ONLY): 24 min   Charges:   PT Evaluation $PT Eval Moderate Complexity: 1 Mod PT Treatments $Gait Training: 8-22 mins   PT G Codes:   PT G-Codes **NOT FOR INPATIENT CLASS** Functional Assessment Tool Used: AM-PAC 6 Clicks Basic Mobility Functional Limitation: Mobility: Walking and moving around Mobility: Walking and Moving Around Current Status ([id number]): At  least 40 percent but less than 60 percent impaired, limited or restricted Mobility: Walking and Moving Around Goal Status [phone]): At least 20 percent but less than 40 percent impaired, limited or restricted    [place], [place] 11/08/2016   Reginia Naas 11/08/2016, 10:44 AM

## 2016-11-10 ENCOUNTER — Encounter: Payer: Self-pay | Admitting: Family Medicine

## 2016-11-11 ENCOUNTER — Telehealth: Payer: Self-pay | Admitting: Family Medicine

## 2016-11-11 ENCOUNTER — Encounter: Payer: Self-pay | Admitting: Family Medicine

## 2016-11-11 NOTE — Telephone Encounter (Signed)
Pt was seen in ER DX with Flu A and needed a work note for be out of work from 11/07/16 - 11/17/16 which is for this week and then she is not scheduled to go back to work until 11/17/16. Ok to do?

## 2016-11-11 NOTE — Telephone Encounter (Signed)
ok 

## 2016-11-21 ENCOUNTER — Telehealth: Payer: Self-pay | Admitting: Cardiology

## 2016-11-21 NOTE — Telephone Encounter (Signed)
Patient calling and states that she has been SOB and tired since she was discharged from the hospital 9/25 and that she has been coughing and has had some sweating and headaches. Patient was diagnosed with the flu and was started on Tamiflu and advised to follow up with PCP. Patient denies any chest pain, weight gain, swelling in her lower extremities, or any other symptoms. Patient states that she has not followed up with PCP yet. Made patient aware that her symptoms sound related to the flu and that she should follow-up with PCP. Patient states that she would like to discuss with Dr. Radford Pax about going on disability. She states that she feels like she cannot take care of herself like she should and work at the same time. Made patient aware that I did not think disability was warranted at this time, but the information would be forwarded to Dr. Radford Pax.

## 2016-11-21 NOTE — Telephone Encounter (Signed)
New message    Pt is calling.   Pt c/o Shortness Of Breath: STAT if SOB developed within the last 24 hours or pt is noticeably SOB on the phone  1. Are you currently SOB (can you hear that pt is SOB on the phone)? No   2. How long have you been experiencing SOB? Since being in the hospital, about a week and a half ago.  3. Are you SOB when sitting or when up moving around? Moving around  4. Are you currently experiencing any other symptoms? Tired

## 2016-11-22 NOTE — Telephone Encounter (Signed)
Spoke with patient to review Dr. Theodosia Blender advice with her. I advised her to continue current medications, good diet and BP control and to keep follow-up appointments so that we can continue to track her progress. She verbalized understanding and thanked me for the call.

## 2016-11-22 NOTE — Telephone Encounter (Signed)
Her LVF has normalized.  She does not qualify for disability from cardiac standpoint

## 2016-11-25 ENCOUNTER — Ambulatory Visit (INDEPENDENT_AMBULATORY_CARE_PROVIDER_SITE_OTHER): Payer: Managed Care, Other (non HMO) | Admitting: Pharmacist

## 2016-11-25 DIAGNOSIS — E782 Mixed hyperlipidemia: Secondary | ICD-10-CM

## 2016-11-25 MED ORDER — ROSUVASTATIN CALCIUM 5 MG PO TABS: ORAL_TABLET | ORAL | 5 refills | Status: DC

## 2016-11-25 NOTE — Progress Notes (Signed)
Patient ID: [name]                 DOB: 10/14/1955                    MRN: [id number]     HPI: [name] is a 61 y.o. female patient of Dr Radford Pax referred to lipid clinic by [facility]. PMH is significant for LBBB. Obesity, hypertension, heart failure (on Entresto) with recent EF 45%, mix hyperlipidemia, and pre-diabetes.  No history of MI, stoke, ASCVD or PAD. Patient denies stent placement or history of stroke. Noted history of statin intolerance with severe pain develop while using simvastatin 80mg , simvastatin 40mg , and rosuvastatin 20mg . Patient was able to tolerate fluvastatin for 2 years but her insurance stopped pain for medication.    Patient presents to lipid clinic today for potential PCSK9i initiation. Most recent LDL was at 79mg /dL while using simvastatin but patient had to stopped due to pain and problems ambulating. 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  Current Medications: none  Intolerances: Fluvastatin 80mg  daily ( 2016 to 2018) - unable to afford Rosuvastatin 20mg  daily (09/13/2016) - intolerable muscle pain; unable to ambulate Rosuvastatin 5mg  daily (05/28/2015) Simvastatin 80mg  daily (03/07/2016) - muscle pain Simvastatin 40mg  daily (10/27/2016) -muscle pain  LDL goal: < 100mg /dL  Diet: low salt, cooking a lot of her foods.  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   Exercise: Pt has been walking on her treadmill 10-20 minutes a few days of the week.  She is not able to do this on a consistent basis because she is too tired the next day.   Family History: family history includes Alcohol abuse in her father; Arthritis in her mother; Cancer in her father and paternal grandmother; Depression in her father and mother; Drug abuse in her brother; Early death in her maternal grandmother; Hearing loss in her mother; Hyperlipidemia in her father and mother;  Hypertension in her father  Social History: former smoker, occasional drinking, denies illicit drug use  Labs: 03/21/2016: CHO 161; TG 70; HDL 68; LDL-c 79 (simvastatin 80mg ) - had to stop due to muscle pain 02/19/2015: CHO 202; TG 88; HDL 64; LDL-c 120 (baseline)   Past Medical History:  Diagnosis Date  . Anxiety   . Aortic stenosis, mild    echo 2017  . Breast cancer ([place])    left breast  . Carpal tunnel syndrome   . Cataract   . Chronic diastolic CHF (congestive heart failure) (HCC)    NYHA class II  . Hyperlipidemia   . Hypertension   . Left bundle branch block (LBBB)   . Low back pain   . Neuropathy   . NICM (nonischemic cardiomyopathy) ([place])    EF 30%, cath 05/20/2015 clean coronary.  EF resolved by echo with EF 55%  . OSA on CPAP   . Prediabetes   . Pulmonary HTN ([place]) 05/2015   Severe with PASP 67/61mmHg - resolved on followup echo  . PVC's (premature ventricular contractions) 11/04/2015    Current Outpatient Prescriptions on File Prior to Visit  Medication Sig Dispense Refill  . acetaminophen (TYLENOL) 325 MG tablet Take 650 mg by mouth every 6 (six) hours as needed for mild pain.     . ASHWAGANDHA PO Take 1 tablet by mouth daily.    Marland Kitchen  aspirin 81 MG tablet Take 81 mg by mouth daily.    . carvedilol (COREG) 6.25 MG tablet Take 1 tablet (6.25 mg total) by mouth 2 (two) times daily with a meal. 180 tablet 3  . cholecalciferol (VITAMIN D) 1000 units tablet Take 1,000 Units by mouth daily.    . furosemide (LASIX) 40 MG tablet TAKE ONE TO TWO TABLETS BY MOUTH DAILY AS DIRECTED 180 tablet 3  . Multiple Vitamin (MULTIVITAMIN) tablet Take 1 tablet by mouth daily.    . sacubitril-valsartan (ENTRESTO) 24-26 MG Take 1 tablet by mouth 2 (two) times daily. 180 tablet 3  . spironolactone (ALDACTONE) 25 MG tablet Take 0.5 tablets (12.5 mg total) by mouth daily. 90 tablet 3  . traMADol (ULTRAM) 50 MG tablet Take 1 tablet (50 mg total) by mouth every 6 (six) hours as needed for moderate  pain. 15 tablet 0  . vitamin C (ASCORBIC ACID) 500 MG tablet Take 500 mg by mouth as needed.      No current facility-administered medications on file prior to visit.     Allergies  Allergen Reactions  . Atorvastatin Other (See Comments)    REACTION: mylagias  . Pravastatin Sodium Other (See Comments)    REACTION: myalgias  . Sulfa Antibiotics Hives    Assessment/Plan:  1. Hyperlipidemia - Recent LDL at goal for primary prevention. Patient is unable to tolerate high dose rosuvastatin or simvastatin due to severe muscle pain. Used fluvastatin for few years without problems, but is not able to afford anymore after insurance removed product from formulary. Never tried zetia (ezetimibe) in the past either. 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 Patient is not a candidate for PCSK9i therapy yet. Will start rosuvastatin 5mg  twice weekly and repeat lipid profile in 8-10 weeks if able to tolerate. We also discussed initiating zetia 10mg  daily if unable to tolerate rosuvastatin or if LDL not at goal after 10 weeks on low dose statin.   Maykel Reitter Rodriguez-Guzman PharmD, BCPS, [place] [address] 11/25/2016 1:44 PM

## 2016-11-25 NOTE — Assessment & Plan Note (Signed)
Recent LDL at goal for primary prevention. Patient is unable to tolerate high dose rosuvastatin or simvastatin due to severe muscle pain. Used fluvastatin for few years without problems, but is not able to afford anymore after insurance removed product from formulary. Never tried zetia (ezetimibe) in the past either. 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 Patient is not a candidate for PCSK9i therapy yet. Will start rosuvastatin 5mg  twice weekly and repeat lipid profile in 8-10 weeks if able to tolerate. We also discussed initiating zetia 10mg  daily if unable to tolerate rosuvastatin or if LDL not at goal after 10 weeks on low dose statin.

## 2016-11-25 NOTE — Patient Instructions (Addendum)
Lipids Clinic (pharmacist) Taras Rask/Megan/Kelly *Start taking crestor (rosuvastatin) 5mg  every Monday and every Friday* *Repeat blood work in 10 weeks*   Cholesterol Cholesterol is a fat. Your body needs a small amount of cholesterol. Cholesterol (plaque) may build up in your blood vessels (arteries). That makes you more likely to have a heart attack or stroke. You cannot feel your cholesterol level. Having a blood test is the only way to find out if your level is high. Keep your test results. Work with your doctor to keep your cholesterol at a good level. What do the results mean?  Total cholesterol is how much cholesterol is in your blood.  LDL is bad cholesterol. This is the type that can build up. Try to have low LDL.  HDL is good cholesterol. It cleans your blood vessels and carries LDL away. Try to have high HDL.  Triglycerides are fat that the body can store or burn for energy. What are good levels of cholesterol?  Total cholesterol below 200.  LDL below 100 is good for people who have health risks. LDL below 70 is good for people who have very high risks.  HDL above 40 is good. It is best to have HDL of 60 or higher.  Triglycerides below 150. How can I lower my cholesterol? Diet Follow your diet program as told by your doctor.  Choose fish, white meat chicken, or [place] that is roasted or baked. Try not to eat red meat, fried foods, sausage, or lunch meats.  Eat lots of fresh fruits and vegetables.  Choose whole grains, beans, pasta, potatoes, and cereals.  Choose olive oil, corn oil, or canola oil. Only use small amounts.  Try not to eat butter, mayonnaise, shortening, or palm kernel oils.  Try not to eat foods with trans fats.  Choose low-fat or nonfat dairy foods. ? Drink skim or nonfat milk. ? Eat low-fat or nonfat yogurt and cheeses. ? Try not to drink whole milk or cream. ? Try not to eat ice cream, egg yolks, or full-fat cheeses.  Healthy desserts  include angel food cake, ginger snaps, animal crackers, hard candy, popsicles, and low-fat or nonfat frozen yogurt. Try not to eat pastries, cakes, pies, and cookies.  Exercise Follow your exercise program as told by your doctor.  Be more active. Try gardening, walking, and taking the stairs.  Ask your doctor about ways that you can be more active.  Medicine  Take over-the-counter and prescription medicines only as told by your doctor.  This information is not intended to replace advice given to you by your health care provider. Make sure you discuss any questions you have with your health care provider. Document Released: 04/29/2008 Document Revised: 09/02/2015 Document Reviewed: 08/13/2015 Elsevier Interactive Patient Education  2017 [employer].

## 2016-11-28 ENCOUNTER — Other Ambulatory Visit: Payer: Self-pay | Admitting: Pharmacist

## 2016-11-28 MED ORDER — ROSUVASTATIN CALCIUM 5 MG PO TABS: ORAL_TABLET | ORAL | 5 refills | Status: DC

## 2017-02-03 ENCOUNTER — Other Ambulatory Visit: Payer: Managed Care, Other (non HMO)

## 2017-02-06 ENCOUNTER — Other Ambulatory Visit: Payer: Managed Care, Other (non HMO)

## 2017-02-16 ENCOUNTER — Other Ambulatory Visit: Payer: Managed Care, Other (non HMO) | Admitting: *Deleted

## 2017-02-16 DIAGNOSIS — E782 Mixed hyperlipidemia: Secondary | ICD-10-CM

## 2017-02-17 LAB — HEPATIC FUNCTION PANEL
ALBUMIN: 3.9 g/dL (ref 3.6–4.8)
ALT: 16 IU/L (ref 0–32)
AST: 10 IU/L (ref 0–40)
Alkaline Phosphatase: 56 IU/L (ref 39–117)
Bilirubin Total: 0.2 mg/dL (ref 0.0–1.2)
Bilirubin, Direct: 0.11 mg/dL (ref 0.00–0.40)
Total Protein: 6.6 g/dL (ref 6.0–8.5)

## 2017-02-17 LAB — LIPID PANEL
CHOL/HDL RATIO: 3.3 ratio (ref 0.0–4.4)
Cholesterol, Total: 184 mg/dL (ref 100–199)
HDL: 55 mg/dL (ref >39)
LDL CALC: 108 mg/dL — AB (ref 0–99)
TRIGLYCERIDES: 107 mg/dL (ref 0–149)
VLDL CHOLESTEROL CAL: 21 mg/dL (ref 5–40)

## 2017-02-23 ENCOUNTER — Telehealth: Payer: Self-pay | Admitting: Cardiology

## 2017-02-23 DIAGNOSIS — E782 Mixed hyperlipidemia: Secondary | ICD-10-CM

## 2017-02-23 MED ORDER — EZETIMIBE 10 MG PO TABS: 10.0000 mg | ORAL_TABLET | Freq: Every day | ORAL | 11 refills | Status: DC

## 2017-02-23 NOTE — Telephone Encounter (Signed)
Notes recorded by Leeroy Bock, RPH on 02/21/2017 at 1:00 PM EST LDL goal < 100 for primary prevention with risk factors. Pt is previously intolerant to Crestor 5mg  daily - would see if she is willing to increase frequency of Crestor to 3x per week or start Zetia 10mg  daily.

## 2017-02-23 NOTE — Telephone Encounter (Signed)
New message ° ° °Patient calling for lab results. Please call °

## 2017-02-23 NOTE — Telephone Encounter (Signed)
Pt does not need PCSK9i therapy with an LDL of 108 above goal < 100. Called pt who states she is having some muscle and joint aches on the Crestor twice weekly. Will stop the Crestor 5mg  and start Zetia 10mg  daily. Scheduled f/u lipid panel in 3 months.

## 2017-02-23 NOTE — Telephone Encounter (Signed)
Called patient regarding her lab results from 02/16/17. Patient made aware of results and recommendations by Lipid Clinic. Patient states that she was interested in getting more information about the injections and wanted to discuss further before making any changes. Will forward to Lipid Clinic to return call to patient.

## 2017-02-27 ENCOUNTER — Telehealth: Payer: Self-pay | Admitting: Cardiology

## 2017-02-27 NOTE — Telephone Encounter (Signed)
Returned call to patient. Patient states she spoke with Jinny Blossom, [facility] who adjusted mediation. Patient states she feels much better since she has stopped taking Crestor. Patient is going to try the Zetia, and labs have been scheduled. Patient verbalized understanding and thanked me for the call.

## 2017-02-27 NOTE — Telephone Encounter (Signed)
New Message   Patient is calling in reference to a message she received on 02/24/2017. She believes its in reference to a medication taht she was prescribed to take. Please call.

## 2017-02-28 ENCOUNTER — Telehealth: Payer: Self-pay

## 2017-02-28 NOTE — Telephone Encounter (Signed)
I did an [place] [facility] over the phone with Judeen Hammans at [employer]. Per Judeen Hammans this PA is approved. Approval good from 02/14/2017 until 03/16/2018.

## 2017-03-20 ENCOUNTER — Ambulatory Visit
Admission: RE | Admit: 2017-03-20 | Discharge: 2017-03-20 | Disposition: A | Discharge status: Home / Self Care | Payer: Managed Care, Other (non HMO) | Source: Ambulatory Visit | Attending: Family Medicine | Admitting: Family Medicine

## 2017-03-20 ENCOUNTER — Ambulatory Visit: Payer: Managed Care, Other (non HMO) | Admitting: Family Medicine

## 2017-03-20 ENCOUNTER — Encounter: Payer: Self-pay | Admitting: Family Medicine

## 2017-03-20 VITALS — BP 126/74 | HR 80 | Temp 98.6°F | Resp 20 | Ht 64.5 in | Wt 263.0 lb

## 2017-03-20 DIAGNOSIS — R091 Pleurisy: Secondary | ICD-10-CM | POA: Diagnosis not present

## 2017-03-20 DIAGNOSIS — J069 Acute upper respiratory infection, unspecified: Secondary | ICD-10-CM

## 2017-03-20 MED ORDER — HYDROCODONE-HOMATROPINE 5-1.5 MG/5ML PO SYRP: 5.0000 mL | ORAL_SOLUTION | Freq: Three times a day (TID) | ORAL | 0 refills | Status: DC | PRN

## 2017-03-20 NOTE — Progress Notes (Signed)
Subjective:    Patient ID: [name], female    DOB: 02/24/1955, 62 y.o.   MRN: [id number]  HPI Symptoms began on Friday or Saturday.  Symptoms include head congestion, rhinorrhea, postnasal drip, chest congestion, mild shortness of breath.  Bigger concern is right-sided back pain and right flank pain.  It hurts to take a deep breath in.  It hurts to cough.  She is tender to palpation over the ribs directly below her shoulder blade with tenderness to palpation from the shoulder blade to the axillary line.  Pain is out of proportion to exam.  There is no bruising.  There is no rash in that area.  She denies any new severe shortness of breath.  She denies any fevers.  She denies any hemoptysis.  Cough is productive of yellow sputum. Past Medical History:  Diagnosis Date  . Anxiety   . Aortic stenosis, mild    echo 2017  . Breast cancer ([place])    left breast  . Carpal tunnel syndrome   . Cataract   . Chronic diastolic CHF (congestive heart failure) (HCC)    NYHA class II  . Hyperlipidemia   . Hypertension   . Left bundle branch block (LBBB)   . Low back pain   . Neuropathy   . NICM (nonischemic cardiomyopathy) ([place])    EF 30%, cath 05/20/2015 clean coronary.  EF resolved by echo with EF 55%  . OSA on CPAP   . Prediabetes   . Pulmonary HTN ([place]) 05/2015   Severe with PASP 67/22mmHg - resolved on followup echo  . PVC's (premature ventricular contractions) 11/04/2015   Past Surgical History:  Procedure Laterality Date  . CARDIAC CATHETERIZATION N/A 05/20/2015   Procedure: Right/Left Heart Cath and Coronary Angiography;  Surgeon: Troy Sine, MD;  Location: [facility];  Service: Cardiovascular;  Laterality: N/A;  . CATARACT EXTRACTION W/ INTRAOCULAR LENS  IMPLANT, BILATERAL    . DILATION AND CURETTAGE OF UTERUS    . lumpectomy left breast    . UTERINE FIBROID SURGERY     Current Outpatient Medications on File Prior to Visit  Medication Sig Dispense Refill  .  acetaminophen (TYLENOL) 325 MG tablet Take 650 mg by mouth every 6 (six) hours as needed for mild pain.     Marland Kitchen aspirin 81 MG tablet Take 81 mg by mouth daily.    . carvedilol (COREG) 6.25 MG tablet Take 1 tablet (6.25 mg total) by mouth 2 (two) times daily with a meal. 180 tablet 3  . cholecalciferol (VITAMIN D) 1000 units tablet Take 1,000 Units by mouth daily.    Marland Kitchen ezetimibe (ZETIA) 10 MG tablet Take 1 tablet (10 mg total) by mouth daily. 30 tablet 11  . furosemide (LASIX) 40 MG tablet TAKE ONE TO TWO TABLETS BY MOUTH DAILY AS DIRECTED 180 tablet 3  . Multiple Vitamin (MULTIVITAMIN) tablet Take 1 tablet by mouth daily.    . sacubitril-valsartan (ENTRESTO) 24-26 MG Take 1 tablet by mouth 2 (two) times daily. 180 tablet 3  . spironolactone (ALDACTONE) 25 MG tablet Take 0.5 tablets (12.5 mg total) by mouth daily. 90 tablet 3  . vitamin C (ASCORBIC ACID) 500 MG tablet Take 500 mg by mouth as needed.     . traMADol (ULTRAM) 50 MG tablet Take 1 tablet (50 mg total) by mouth every 6 (six) hours as needed for moderate pain. (Patient not taking: Reported on 03/20/2017) 15 tablet 0   No current facility-administered medications  on file prior to visit.    Allergies  Allergen Reactions  . Atorvastatin Other (See Comments)    REACTION: mylagias  . Pravastatin Sodium Other (See Comments)    REACTION: myalgias  . Sulfa Antibiotics Hives   Social History   Socioeconomic History  . Marital status: Significant Other    Spouse name: Not on file  . Number of children: Not on file  . Years of education: Not on file  . Highest education level: Not on file  Social Needs  . Financial resource strain: Not on file  . Food insecurity - worry: Not on file  . Food insecurity - inability: Not on file  . Transportation needs - medical: Not on file  . Transportation needs - non-medical: Not on file  Occupational History  . Occupation: Truck Geophysicist/field seismologist  Tobacco Use  . Smoking status: Former Smoker    Last attempt  to quit: 06/18/2009    Years since quitting: 7.7  . Smokeless tobacco: Never Used  Substance and Sexual Activity  . Alcohol use: Yes    Alcohol/week: 0.0 oz    Comment: once every 2 months  . Drug use: No  . Sexual activity: Yes  Other Topics Concern  . Not on file  Social History Narrative  . Not on file      Review of Systems  All other systems reviewed and are negative.      Objective:   Physical Exam  Constitutional: She appears well-developed.  HENT:  Right Ear: Tympanic membrane, external ear and ear canal normal.  Left Ear: Tympanic membrane, external ear and ear canal normal.  Nose: Mucosal edema and rhinorrhea present. Right sinus exhibits no maxillary sinus tenderness and no frontal sinus tenderness. Left sinus exhibits no maxillary sinus tenderness and no frontal sinus tenderness.  Mouth/Throat: Oropharynx is clear and moist. No oropharyngeal exudate, posterior oropharyngeal edema or posterior oropharyngeal erythema.  Neck: No JVD present.  Cardiovascular: Normal rate, regular rhythm and normal heart sounds.  No murmur heard. Pulmonary/Chest: Effort normal and breath sounds normal. No respiratory distress. She has no wheezes. She has no rales.     She exhibits tenderness.    Lymphadenopathy:    She has no cervical adenopathy.  Vitals reviewed.         Assessment & Plan:  .Pleurisy - Plan: DG Chest 2 View, HYDROcodone-homatropine (HYCODAN) 5-1.5 MG/5ML syrup  Viral upper respiratory tract infection  Symptoms are consistent with a viral upper respiratory infection.  I recommended tincture of time.  She can use Hycodan 1 teaspoon every 6 hours as needed for cough.  This should help with pleurisy as well.  Exam does not support influenza.  There is no evidence of pneumonia on her exam.  Given her pleurisy, I will obtain a chest x-ray to rule out pneumonia although her lungs are clear to auscultation bilaterally and there is no respiratory distress.   Pulmonary embolism is unlikely.  The patient is extremely tender to palpation in the area diagrammed in red on the diagram.  Therefore her pleurisy seems to be musculoskeletal in nature and I believe is an intercostal muscle strain.  If chest x-ray is negative, I would recommend tincture of time and muscle pain should improve over the next week.  Obviously if the patient developed severe shortness of breath she should be rechecked immediately.  If the chest x-ray shows underlying pneumonia I will add antibiotics but her symptoms seem consistent with a virus.  I will give the  patient 3 days out of work for rest and recovery and then reassess

## 2017-05-24 ENCOUNTER — Other Ambulatory Visit: Payer: Managed Care, Other (non HMO) | Admitting: *Deleted

## 2017-05-24 DIAGNOSIS — E782 Mixed hyperlipidemia: Secondary | ICD-10-CM

## 2017-05-24 LAB — LIPID PANEL
Chol/HDL Ratio: 4.6 ratio — ABNORMAL HIGH (ref 0.0–4.4)
Cholesterol, Total: 262 mg/dL — ABNORMAL HIGH (ref 100–199)
HDL: 57 mg/dL (ref >39)
LDL Calculated: 176 mg/dL — ABNORMAL HIGH (ref 0–99)
Triglycerides: 145 mg/dL (ref 0–149)
VLDL CHOLESTEROL CAL: 29 mg/dL (ref 5–40)

## 2017-07-06 ENCOUNTER — Telehealth: Payer: Self-pay | Admitting: *Deleted

## 2017-07-06 DIAGNOSIS — G4733 Obstructive sleep apnea (adult) (pediatric): Secondary | ICD-10-CM

## 2017-07-06 NOTE — Telephone Encounter (Addendum)
Order faxed to [facility]. 

## 2017-07-06 NOTE — Telephone Encounter (Signed)
Per Dr Radford Pax, Order an Airsense CPAP at 13 cm H20 with heated humidity

## 2017-07-13 ENCOUNTER — Telehealth: Payer: Self-pay | Admitting: *Deleted

## 2017-07-13 NOTE — Telephone Encounter (Signed)
-----   Message from Sueanne Margarita, MD sent at 07/11/2017  6:22 PM EDT ----- Good AHI and compliance.  Continue current PAP settings.

## 2017-07-13 NOTE — Telephone Encounter (Signed)
Patient is awareof AHI being within range at 2.5. Patient is aware of being in compliance with machine usage. Patient is aware of no change in current pressures. Left detailed message on voicemail with result of compliance report and informed patient to call back with questions

## 2017-08-01 NOTE — Telephone Encounter (Signed)
Per medicare guidelines patient must have an office visit within the last 6 months. Patient has been scheduled for Monday 10/09/17 at 1 pm.  Left detailed message on voicemail with date and time to call back to confirm or reschedule this appointment.

## 2017-09-27 ENCOUNTER — Encounter: Payer: Self-pay | Admitting: Family Medicine

## 2017-09-27 ENCOUNTER — Ambulatory Visit (INDEPENDENT_AMBULATORY_CARE_PROVIDER_SITE_OTHER): Payer: Managed Care, Other (non HMO) | Admitting: Family Medicine

## 2017-09-27 VITALS — BP 108/76 | HR 68 | Temp 97.4°F | Ht 64.5 in | Wt 261.0 lb

## 2017-09-27 DIAGNOSIS — E782 Mixed hyperlipidemia: Secondary | ICD-10-CM | POA: Diagnosis not present

## 2017-09-27 DIAGNOSIS — Z23 Encounter for immunization: Secondary | ICD-10-CM

## 2017-09-27 DIAGNOSIS — R7303 Prediabetes: Secondary | ICD-10-CM

## 2017-09-27 DIAGNOSIS — Z0000 Encounter for general adult medical examination without abnormal findings: Secondary | ICD-10-CM

## 2017-09-27 DIAGNOSIS — I110 Hypertensive heart disease with heart failure: Secondary | ICD-10-CM

## 2017-09-27 MED ORDER — MOMETASONE FUROATE 50 MCG/ACT NA SUSP
2.0000 | Freq: Every day | NASAL | 12 refills | Status: DC
Start: 2017-09-27 — End: 2017-12-18

## 2017-09-27 MED ORDER — ZOSTER VAC RECOMB ADJUVANTED 50 MCG/0.5ML IM SUSR: 0.5000 mL | Freq: Once | INTRAMUSCULAR | 1 refills | Status: AC

## 2017-09-27 NOTE — Progress Notes (Signed)
Patient: [name], Female    DOB: 22-Jun-1955, 62 y.o.   MRN: [id number] Visit Date: 10/02/2017  Today's Provider: Delsa Grana, PA-C   Chief Complaint  Patient presents with  . Annual Exam   Subjective:    Annual physical exam [name] is a 62 y.o. female who presents today for health maintenance and complete physical. She feels well. She reports exercising not much, she works as a Administrator. She reports she is sleeping well.  -----------------------------------------------------------------   CPAP - reports compliance  Tdap updated per [facility] report done 12/24/2009 Colonscopy due 05/17/19 Mammogram due 2020  Obesity - BMI 44.1 today, no change to weight over the past year  HTN - reports compliance to all meds, on coreg 6.25 BID, lasix, Entresto and spironolactone CHF - she believes she's due for ECHO, does see Cardiology once a year She reports Intermittent LE swelling - uses lasix titration for edema, and watches weights, no LE swelling now She denies CP, reports her baseline is ability to walk 100-200 feet w/o having to stop but feels she breaths harder.  She complains of nasal congestion and discharge, which she believes is from her CPAP - no OTC meds or allergy meds  Review of Systems  Constitutional: Negative.   HENT: Negative.   Eyes: Negative.   Respiratory: Negative.   Cardiovascular: Negative.   Gastrointestinal: Negative.   Endocrine: Negative.   Genitourinary: Negative.   Musculoskeletal: Negative.   Skin: Negative.   Allergic/Immunologic: Negative.   Neurological: Negative.   Hematological: Negative.   Psychiatric/Behavioral: Negative.   All other systems reviewed and are negative.    Social History      She  reports that she quit smoking about 8 years ago. She has never used smokeless tobacco. She reports that she drinks alcohol. She reports that she does not use drugs.       Social History   Socioeconomic History  . Marital status:  Significant Other    Spouse name: Not on file  . Number of children: Not on file  . Years of education: Not on file  . Highest education level: Not on file  Occupational History  . 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 Financial resource strain: Not on file  . Food insecurity:    Worry: Not on file    Inability: Not on file  . Transportation needs:    Medical: Not on file    Non-medical: Not on file  Tobacco Use  . Smoking status: Former Smoker    Last attempt to quit: 06/18/2009    Years since quitting: 8.2  . Smokeless tobacco: Never Used  Substance and Sexual Activity  . Alcohol use: Yes    Comment: once every 2 months  . Drug use: No  . Sexual activity: Yes  Lifestyle  . Physical activity:    Days per week: Not on file    Minutes per session: Not on file  . Stress: Not on file  Relationships  . Social connections:    Talks on phone: Not on file    Gets together: Not on file    Attends religious service: Not on file    Active member of club or organization: Not on file    Attends meetings of clubs or organizations: Not on file    Relationship status: Not on file  Other Topics Concern  . Not on file  Social History Narrative  . Not on file  Past Medical History:  Diagnosis Date  . Anxiety   . Aortic stenosis, mild    echo 2017  . Breast cancer ([place])    left breast  . Carpal tunnel syndrome   . Cataract   . Chronic diastolic CHF (congestive heart failure) (HCC)    NYHA class II  . Hyperlipidemia   . Hypertension   . Left bundle branch block (LBBB)   . Low back pain   . Neuropathy   . NICM (nonischemic cardiomyopathy) ([place])    EF 30%, cath 05/20/2015 clean coronary.  EF resolved by echo with EF 55%  . OSA on CPAP   . Prediabetes   . Pulmonary HTN ([place]) 05/2015   Severe with PASP 67/72mmHg - resolved on followup echo  . PVC's (premature ventricular contractions) 11/04/2015     Patient Active Problem List   Diagnosis Date Noted  . Influenza A  11/07/2016  . Chronic combined systolic (congestive) and diastolic (congestive) heart failure ([place]) 11/07/2016  . Prediabetes 11/07/2016  . PVC's (premature ventricular contractions) 11/04/2015  . Aortic stenosis, mild   . Heart palpitations 07/28/2015  . Morbid obesity ([place]) 05/23/2015  . Hypertensive heart disease   . Nonischemic cardiomyopathy ([place])   . NSVT (nonsustained ventricular tachycardia) ([place]) 05/21/2015  . Chronic diastolic heart failure ([place]) 05/20/2015  . OSA (obstructive sleep apnea) 01/02/2014  . LBBB (left bundle branch block) 01/02/2014  . Mixed hyperlipidemia 04/15/2010  . Personal history of malignant neoplasm of breast 04/15/2010    Past Surgical History:  Procedure Laterality Date  . CARDIAC CATHETERIZATION N/A 05/20/2015   Procedure: Right/Left Heart Cath and Coronary Angiography;  Surgeon: Troy Sine, MD;  Location: [facility];  Service: Cardiovascular;  Laterality: N/A;  . CATARACT EXTRACTION W/ INTRAOCULAR LENS  IMPLANT, BILATERAL    . DILATION AND CURETTAGE OF UTERUS    . lumpectomy left breast    . UTERINE FIBROID SURGERY      Family History        Family Status  Relation Name Status  . Mother  Deceased at age 29  . Father  Deceased at age 64  . Brother  Alive  . MGM  Deceased at age 60  . PGM  Deceased at age 63  . MGF  Deceased at age 68  . PGF  Deceased at age 72  . Brother  Alive  . Neg Hx  (Not Specified)        Her family history includes Alcohol abuse in her father; Arthritis in her mother; Cancer in her father and paternal grandmother; Colon cancer (age of onset: 65) in her mother; Depression in her father and mother; Drug abuse in her brother; Early death in her maternal grandmother; Hearing loss in her mother; Hyperlipidemia in her father and mother; Hypertension in her father; Miscarriages / [place] in her mother. There is no history of Heart attack.      Allergies  Allergen Reactions  . Atorvastatin Other (See  Comments)    REACTION: mylagias  . Pravastatin Sodium Other (See Comments)    REACTION: myalgias  . Sulfa Antibiotics Hives     Current Outpatient Medications:  .  acetaminophen (TYLENOL) 325 MG tablet, Take 650 mg by mouth every 6 (six) hours as needed for mild pain. , Disp: , Rfl:  .  aspirin 81 MG tablet, Take 81 mg by mouth daily., Disp: , Rfl:  .  carvedilol (COREG) 6.25 MG tablet, Take 1 tablet (6.25 mg total) by  mouth 2 (two) times daily with a meal., Disp: 180 tablet, Rfl: 3 .  cholecalciferol (VITAMIN D) 1000 units tablet, Take 1,000 Units by mouth daily., Disp: , Rfl:  .  furosemide (LASIX) 40 MG tablet, TAKE ONE TO TWO TABLETS BY MOUTH DAILY AS DIRECTED, Disp: 180 tablet, Rfl: 3 .  Multiple Vitamin (MULTIVITAMIN) tablet, Take 1 tablet by mouth daily., Disp: , Rfl:  .  sacubitril-valsartan (ENTRESTO) 24-26 MG, Take 1 tablet by mouth 2 (two) times daily., Disp: 180 tablet, Rfl: 3 .  spironolactone (ALDACTONE) 25 MG tablet, Take 0.5 tablets (12.5 mg total) by mouth daily., Disp: 90 tablet, Rfl: 3 .  vitamin C (ASCORBIC ACID) 500 MG tablet, Take 500 mg by mouth as needed. , Disp: , Rfl:  .  ezetimibe (ZETIA) 10 MG tablet, Take 1 tablet (10 mg total) by mouth daily., Disp: 30 tablet, Rfl: 11 .  mometasone (NASONEX) 50 MCG/ACT nasal spray, Place 2 sprays into the nose daily., Disp: 17 g, Rfl: 12   Patient Care Team: Susy Frizzle, MD as PCP - General (Family Medicine)      Objective:   Vitals: BP 108/76   Pulse 68   Temp (!) 97.4 F (36.3 C) (Oral)   Ht 5' 4.5" (1.638 m)   Wt 261 lb (118.4 kg)   SpO2 98%   BMI 44.11 kg/m    Vitals:   09/27/17 0913  BP: 108/76  Pulse: 68  Temp: (!) 97.4 F (36.3 C)  TempSrc: Oral  SpO2: 98%  Weight: 261 lb (118.4 kg)  Height: 5' 4.5" (1.638 m)     Physical Exam  Constitutional: She appears well-developed and well-nourished. No distress.  Obese, well apppearing, NAD  HENT:  Head: Normocephalic and atraumatic.  Right Ear:  External ear normal.  Left Ear: External ear normal.  Mouth/Throat: Oropharynx is clear and moist.  Nasal congestion, enlarged nasal turbinates, boggy and pale  Eyes: Pupils are equal, round, and reactive to light. Conjunctivae and EOM are normal. Right eye exhibits no discharge. Left eye exhibits no discharge.  Neck: Normal range of motion. Neck supple. No tracheal deviation present. No thyromegaly present.  Cardiovascular: Normal rate, regular rhythm, normal heart sounds and intact distal pulses. Exam reveals no gallop and no friction rub.  No murmur heard. Pulmonary/Chest: Effort normal and breath sounds normal. No stridor. No respiratory distress. She has no wheezes. She has no rales.  Abdominal: Soft. Bowel sounds are normal. She exhibits no distension. There is no tenderness.  Musculoskeletal: Normal range of motion.  Neurological: She is alert. She exhibits normal muscle tone. Coordination normal.  Skin: Skin is warm and dry. Capillary refill takes less than 2 seconds. No rash noted. She is not diaphoretic. No pallor.  Psychiatric: She has a normal mood and affect. Her behavior is normal. Judgment and thought content normal.  Nursing note and vitals reviewed.    Depression Screen PHQ 2/9 Scores 09/27/2017  PHQ - 2 Score 0  PHQ- 9 Score 0     Office Visit from 09/27/2017 in [facility]  AUDIT-C Score  3        Assessment & Plan:     Routine Health Maintenance and Physical Exam  Exercise Activities and Dietary recommendations Goals   None   Work on healthy diet and healthy food choices with career.     Immunization History  Administered Date(s) Administered  . Influenza Split 11/25/2013  . Influenza,inj,Quad PF,6+ Mos 03/12/2015  . Influenza-Unspecified 01/04/2016  .  Pneumococcal Conjugate-13 03/21/2016  . Pneumococcal Polysaccharide-23 05/23/2015   Shingrix sent to pharmacy - pt states she has not had vaccine - educated about inquiring coverage  and get administered at [facility] Date Due  . INFLUENZA VACCINE  09/14/2017  . PAP SMEAR  03/11/2018  . MAMMOGRAM  11/03/2018  . COLONOSCOPY  05/17/2019  . TETANUS/TDAP  12/25/2019  . Hepatitis C Screening  Completed  . HIV Screening  Completed     Discussed health benefits of physical activity, and encouraged her to engage in regular exercise appropriate for her age and condition.    --------------------------------------------------------------------    ICD-10-CM   1. General medical exam Z00.00   2. Mixed hyperlipidemia E78.2 CBC with Differential/Platelet    COMPLETE METABOLIC PANEL WITH GFR    Lipid panel  3. Morbid obesity (HCC) E66.01 CBC with Differential/Platelet    COMPLETE METABOLIC PANEL WITH GFR    Lipid panel    Hemoglobin A1c    TSH  4. Prediabetes R73.03 Hemoglobin A1c  5. Hypertensive heart disease with congestive heart failure, unspecified heart failure type (HCC) I11.0 CBC with Differential/Platelet    COMPLETE METABOLIC PANEL WITH GFR    Lipid panel  6. Need for shingles vaccine Z23 Zoster Vaccine Adjuvanted [facility]) injection   Hx of pre-diabetes documented in chart, A1C obtained with other basic labs and FLP.     Delsa Grana, PA-C 10/02/17 4:39 PM  [facility]

## 2017-09-27 NOTE — Patient Instructions (Signed)
Health Maintenance  Topic Date Due  . INFLUENZA VACCINE  09/14/2017  . PAP SMEAR  03/11/2018  . MAMMOGRAM  11/03/2018  . COLONOSCOPY  05/17/2019  . TETANUS/TDAP  12/25/2019  . Hepatitis C Screening  Completed  . HIV Screening  Completed       Health Maintenance, Female Adopting a healthy lifestyle and getting preventive care can go a long way to promote health and wellness. Talk with your health care provider about what schedule of regular examinations is right for you. This is a good chance for you to check in with your provider about disease prevention and staying healthy. In between checkups, there are plenty of things you can do on your own. Experts have done a lot of research about which lifestyle changes and preventive measures are most likely to keep you healthy. Ask your health care provider for more information. Weight and diet Eat a healthy diet  Be sure to include plenty of vegetables, fruits, low-fat dairy products, and lean protein.  Do not eat a lot of foods high in solid fats, added sugars, or salt.  Get regular exercise. This is one of the most important things you can do for your health. ? Most adults should exercise for at least 150 minutes each week. The exercise should increase your heart rate and make you sweat (moderate-intensity exercise). ? Most adults should also do strengthening exercises at least twice a week. This is in addition to the moderate-intensity exercise.  Maintain a healthy weight  Body mass index (BMI) is a measurement that can be used to identify possible weight problems. It estimates body fat based on height and weight. Your health care provider can help determine your BMI and help you achieve or maintain a healthy weight.  For females 63 years of age and older: ? A BMI below 18.5 is considered underweight. ? A BMI of 18.5 to 24.9 is normal. ? A BMI of 25 to 29.9 is considered overweight. ? A BMI of 30 and above is considered  obese.  Watch levels of cholesterol and blood lipids  You should start having your blood tested for lipids and cholesterol at 63 years of age, then have this test every 5 years.  You may need to have your cholesterol levels checked more often if: ? Your lipid or cholesterol levels are high. ? You are older than 63 years of age. ? You are at high risk for heart disease.  Cancer screening Lung Cancer  Lung cancer screening is recommended for adults 26-65 years old who are at high risk for lung cancer because of a history of smoking.  A yearly low-dose CT scan of the lungs is recommended for people who: ? Currently smoke. ? Have quit within the past 15 years. ? Have at least a 30-pack-year history of smoking. A pack year is smoking an average of one pack of cigarettes a day for 1 year.  Yearly screening should continue until it has been 15 years since you quit.  Yearly screening should stop if you develop a health problem that would prevent you from having lung cancer treatment.  Breast Cancer  Practice breast self-awareness. This means understanding how your breasts normally appear and feel.  It also means doing regular breast self-exams. Let your health care provider know about any changes, no matter how small.  If you are in your 20s or 30s, you should have a clinical breast exam (CBE) by a health care provider every 1-3 years as part  of a regular health exam.  If you are 30 or older, have a CBE every year. Also consider having a breast X-ray (mammogram) every year.  If you have a family history of breast cancer, talk to your health care provider about genetic screening.  If you are at high risk for breast cancer, talk to your health care provider about having an MRI and a mammogram every year.  Breast cancer gene (BRCA) assessment is recommended for women who have family members with BRCA-related cancers. BRCA-related cancers  include: ? Breast. ? Ovarian. ? Tubal. ? Peritoneal cancers.  Results of the assessment will determine the need for genetic counseling and BRCA1 and BRCA2 testing.  Cervical Cancer Your health care provider may recommend that you be screened regularly for cancer of the pelvic organs (ovaries, uterus, and vagina). This screening involves a pelvic examination, including checking for microscopic changes to the surface of your cervix (Pap test). You may be encouraged to have this screening done every 3 years, beginning at age 70.  For women ages 17-65, health care providers may recommend pelvic exams and Pap testing every 3 years, or they may recommend the Pap and pelvic exam, combined with testing for human papilloma virus (HPV), every 5 years. Some types of HPV increase your risk of cervical cancer. Testing for HPV may also be done on women of any age with unclear Pap test results.  Other health care providers may not recommend any screening for nonpregnant women who are considered low risk for pelvic cancer and who do not have symptoms. Ask your health care provider if a screening pelvic exam is right for you.  If you have had past treatment for cervical cancer or a condition that could lead to cancer, you need Pap tests and screening for cancer for at least 20 years after your treatment. If Pap tests have been discontinued, your risk factors (such as having a new sexual partner) need to be reassessed to determine if screening should resume. Some women have medical problems that increase the chance of getting cervical cancer. In these cases, your health care provider may recommend more frequent screening and Pap tests.  Colorectal Cancer  This type of cancer can be detected and often prevented.  Routine colorectal cancer screening usually begins at 63 years of age and continues through 63 years of age.  Your health care provider may recommend screening at an earlier age if you have risk factors  for colon cancer.  Your health care provider may also recommend using home test kits to check for hidden blood in the stool.  A small camera at the end of a tube can be used to examine your colon directly (sigmoidoscopy or colonoscopy). This is done to check for the earliest forms of colorectal cancer.  Routine screening usually begins at age 85.  Direct examination of the colon should be repeated every 5-10 years through 63 years of age. However, you may need to be screened more often if early forms of precancerous polyps or small growths are found.  Skin Cancer  Check your skin from head to toe regularly.  Tell your health care provider about any new moles or changes in moles, especially if there is a change in a mole's shape or color.  Also tell your health care provider if you have a mole that is larger than the size of a pencil eraser.  Always use sunscreen. Apply sunscreen liberally and repeatedly throughout the day.  Protect yourself by wearing long  sleeves, pants, a wide-brimmed hat, and sunglasses whenever you are outside.  Heart disease, diabetes, and high blood pressure  High blood pressure causes heart disease and increases the risk of stroke. High blood pressure is more likely to develop in: ? People who have blood pressure in the high end of the normal range (130-139/85-89 mm Hg). ? People who are overweight or obese. ? People who are African American.  If you are 21-57 years of age, have your blood pressure checked every 3-5 years. If you are 16 years of age or older, have your blood pressure checked every year. You should have your blood pressure measured twice-once when you are at a hospital or clinic, and once when you are not at a hospital or clinic. Record the average of the two measurements. To check your blood pressure when you are not at a hospital or clinic, you can use: ? An automated blood pressure machine at a pharmacy. ? A home blood pressure monitor.  If  you are between 50 years and 58 years old, ask your health care provider if you should take aspirin to prevent strokes.  Have regular diabetes screenings. This involves taking a blood sample to check your fasting blood sugar level. ? If you are at a normal weight and have a low risk for diabetes, have this test once every three years after 63 years of age. ? If you are overweight and have a high risk for diabetes, consider being tested at a younger age or more often. Preventing infection Hepatitis B  If you have a higher risk for hepatitis B, you should be screened for this virus. You are considered at high risk for hepatitis B if: ? You were born in a country where hepatitis B is common. Ask your health care provider which countries are considered high risk. ? Your parents were born in a high-risk country, and you have not been immunized against hepatitis B (hepatitis B vaccine). ? You have HIV or AIDS. ? You use needles to inject street drugs. ? You live with someone who has hepatitis B. ? You have had sex with someone who has hepatitis B. ? You get hemodialysis treatment. ? You take certain medicines for conditions, including cancer, organ transplantation, and autoimmune conditions.  Hepatitis C  Blood testing is recommended for: ? Everyone born from [birth year] through 1966. ? Anyone with known risk factors for hepatitis C.  Sexually transmitted infections (STIs)  You should be screened for sexually transmitted infections (STIs) including gonorrhea and chlamydia if: ? You are sexually active and are younger than 63 years of age. ? You are older than 63 years of age and your health care provider tells you that you are at risk for this type of infection. ? Your sexual activity has changed since you were last screened and you are at an increased risk for chlamydia or gonorrhea. Ask your health care provider if you are at risk.  If you do not have HIV, but are at risk, it may be recommended  that you take a prescription medicine daily to prevent HIV infection. This is called pre-exposure prophylaxis (PrEP). You are considered at risk if: ? You are sexually active and do not regularly use condoms or know the HIV status of your partner(s). ? You take drugs by injection. ? You are sexually active with a partner who has HIV.  Talk with your health care provider about whether you are at high risk of being infected with HIV. If  you choose to begin PrEP, you should first be tested for HIV. You should then be tested every 3 months for as long as you are taking PrEP. Pregnancy  If you are premenopausal and you may become pregnant, ask your health care provider about preconception counseling.  If you may become pregnant, take 400 to 800 micrograms (mcg) of folic acid every day.  If you want to prevent pregnancy, talk to your health care provider about birth control (contraception). Osteoporosis and menopause  Osteoporosis is a disease in which the bones lose minerals and strength with aging. This can result in serious bone fractures. Your risk for osteoporosis can be identified using a bone density scan.  If you are 51 years of age or older, or if you are at risk for osteoporosis and fractures, ask your health care provider if you should be screened.  Ask your health care provider whether you should take a calcium or vitamin D supplement to lower your risk for osteoporosis.  Menopause may have certain physical symptoms and risks.  Hormone replacement therapy may reduce some of these symptoms and risks. Talk to your health care provider about whether hormone replacement therapy is right for you. Follow these instructions at home:  Schedule regular health, dental, and eye exams.  Stay current with your immunizations.  Do not use any tobacco products including cigarettes, chewing tobacco, or electronic cigarettes.  If you are pregnant, do not drink alcohol.  If you are  breastfeeding, limit how much and how often you drink alcohol.  Limit alcohol intake to no more than 1 drink per day for nonpregnant women. One drink equals 12 ounces of beer, 5 ounces of wine, or 1 ounces of hard liquor.  Do not use street drugs.  Do not share needles.  Ask your health care provider for help if you need support or information about quitting drugs.  Tell your health care provider if you often feel depressed.  Tell your health care provider if you have ever been abused or do not feel safe at home. This information is not intended to replace advice given to you by your health care provider. Make sure you discuss any questions you have with your health care provider. Document Released: 08/16/2010 Document Revised: 07/09/2015 Document Reviewed: 11/04/2014 Elsevier Interactive Patient Education  [employer].

## 2017-09-28 LAB — LIPID PANEL
CHOLESTEROL: 257 mg/dL — AB (ref <200)
HDL: 59 mg/dL (ref >50)
LDL Cholesterol (Calc): 168 mg/dL (calc) — ABNORMAL HIGH
Non-HDL Cholesterol (Calc): 198 mg/dL (calc) — ABNORMAL HIGH (ref <130)
Total CHOL/HDL Ratio: 4.4 (calc) (ref <5.0)
Triglycerides: 157 mg/dL — ABNORMAL HIGH (ref <150)

## 2017-09-28 LAB — CBC WITH DIFFERENTIAL/PLATELET
BASOS ABS: 30 {cells}/uL (ref 0–200)
Basophils Relative: 0.4 %
Eosinophils Absolute: 106 cells/uL (ref 15–500)
Eosinophils Relative: 1.4 %
HEMATOCRIT: 39.7 % (ref 35.0–45.0)
Hemoglobin: 13.6 g/dL (ref 11.7–15.5)
LYMPHS ABS: 2113 {cells}/uL (ref 850–3900)
MCH: 31 pg (ref 27.0–33.0)
MCHC: 34.3 g/dL (ref 32.0–36.0)
MCV: 90.4 fL (ref 80.0–100.0)
MPV: 10.3 fL (ref 7.5–12.5)
Monocytes Relative: 7 %
Neutro Abs: 4818 cells/uL (ref 1500–7800)
Neutrophils Relative %: 63.4 %
Platelets: 326 10*3/uL (ref 140–400)
RBC: 4.39 10*6/uL (ref 3.80–5.10)
RDW: 13.4 % (ref 11.0–15.0)
Total Lymphocyte: 27.8 %
WBC: 7.6 10*3/uL (ref 3.8–10.8)
WBCMIX: 532 {cells}/uL (ref 200–950)

## 2017-09-28 LAB — COMPLETE METABOLIC PANEL WITH GFR
AG RATIO: 1.5 (calc) (ref 1.0–2.5)
ALKALINE PHOSPHATASE (APISO): 61 U/L (ref 33–130)
ALT: 14 U/L (ref 6–29)
AST: 12 U/L (ref 10–35)
Albumin: 4 g/dL (ref 3.6–5.1)
BILIRUBIN TOTAL: 0.4 mg/dL (ref 0.2–1.2)
BUN: 15 mg/dL (ref 7–25)
CO2: 25 mmol/L (ref 20–32)
Calcium: 9.6 mg/dL (ref 8.6–10.4)
Chloride: 104 mmol/L (ref 98–110)
Creat: 0.87 mg/dL (ref 0.50–0.99)
GFR, Est African American: 83 mL/min/{1.73_m2} (ref >=60)
GFR, Est Non African American: 71 mL/min/{1.73_m2} (ref >=60)
Globulin: 2.6 g/dL (calc) (ref 1.9–3.7)
Glucose, Bld: 130 mg/dL — ABNORMAL HIGH (ref 65–99)
POTASSIUM: 4.6 mmol/L (ref 3.5–5.3)
Sodium: 139 mmol/L (ref 135–146)
Total Protein: 6.6 g/dL (ref 6.1–8.1)

## 2017-09-28 LAB — HEMOGLOBIN A1C
EAG (MMOL/L): 8.2 (calc)
Hgb A1c MFr Bld: 6.8 % of total Hgb — ABNORMAL HIGH (ref <5.7)
Mean Plasma Glucose: 148 (calc)

## 2017-09-28 LAB — TSH: TSH: 0.89 m[IU]/L (ref 0.40–4.50)

## 2017-10-09 ENCOUNTER — Encounter: Payer: Self-pay | Admitting: Cardiology

## 2017-10-09 ENCOUNTER — Ambulatory Visit: Payer: Managed Care, Other (non HMO) | Admitting: Cardiology

## 2017-10-09 ENCOUNTER — Telehealth: Payer: Self-pay | Admitting: *Deleted

## 2017-10-09 VITALS — BP 124/70 | HR 83 | Ht 64.5 in | Wt 261.0 lb

## 2017-10-09 DIAGNOSIS — I350 Nonrheumatic aortic (valve) stenosis: Secondary | ICD-10-CM | POA: Diagnosis not present

## 2017-10-09 DIAGNOSIS — I5032 Chronic diastolic (congestive) heart failure: Secondary | ICD-10-CM | POA: Diagnosis not present

## 2017-10-09 DIAGNOSIS — I110 Hypertensive heart disease with heart failure: Secondary | ICD-10-CM

## 2017-10-09 DIAGNOSIS — I428 Other cardiomyopathies: Secondary | ICD-10-CM | POA: Diagnosis not present

## 2017-10-09 DIAGNOSIS — G4733 Obstructive sleep apnea (adult) (pediatric): Secondary | ICD-10-CM

## 2017-10-09 MED ORDER — CARVEDILOL 6.25 MG PO TABS: 6.2500 mg | ORAL_TABLET | Freq: Two times a day (BID) | ORAL | 3 refills | Status: DC

## 2017-10-09 MED ORDER — SACUBITRIL-VALSARTAN 24-26 MG PO TABS: 1.0000 | ORAL_TABLET | Freq: Two times a day (BID) | ORAL | 3 refills | Status: DC

## 2017-10-09 MED ORDER — EZETIMIBE 10 MG PO TABS: 10.0000 mg | ORAL_TABLET | Freq: Every day | ORAL | 11 refills | Status: DC

## 2017-10-09 MED ORDER — SPIRONOLACTONE 25 MG PO TABS: 12.5000 mg | ORAL_TABLET | Freq: Every day | ORAL | 3 refills | Status: DC

## 2017-10-09 MED ORDER — FUROSEMIDE 40 MG PO TABS: ORAL_TABLET | ORAL | 3 refills | Status: DC

## 2017-10-09 NOTE — Progress Notes (Signed)
Cardiology Office Note:    Date:  10/09/2017   ID:  [name], DOB [birth year], MRN [id number]  PCP:  Susy Frizzle, MD  Cardiologist:  No primary care provider on file.    Referring MD: Susy Frizzle, MD   Chief Complaint  Patient presents with  . Cardiomyopathy  . Hypertension  . Aortic Stenosis    History of Present Illness:    [name] is a 62 y.o. female with a hx of DCM with EF 45%, HTN, LBBB, obesity. Stress test in 2015 with inf and ant wall defects and normal coronary arteries by cath EF 30-35% and severe pulmonary HTN (PASP 67/53mmHg) and NSVT noted on Tele at time of hospitalization for cath. She had a 2D echo in July which showed resolution of DCM with EF 55%, G1DD, mild AS, mild MR and normal PAP.  She also has a history of PACs and PVCs by event monitor and chronic lower extremity edema.  She is here today for followup and is doing well.  She denies any chest pain or pressure, SOB, DOE, PND, orthopnea, LE edema, dizziness, palpitations or syncope. She is compliant with her meds and is tolerating meds with no SE.  She is doing well with her CPAP device.  She tolerates the mask and feels the pressure is adequate.  Since going on CPAP she feels rested in the am and has no significant daytime sleepiness.  She denies any significant mouth or nasal dryness or nasal congestion.  She does not think that he snores.     Past Medical History:  Diagnosis Date  . Anxiety   . Aortic stenosis, mild    echo 2017  . Breast cancer ([place])    left breast  . Carpal tunnel syndrome   . Cataract   . Chronic diastolic CHF (congestive heart failure) ([facility])    NYHA class II  . Hyperlipidemia   . Hypertension   . Left bundle branch block (LBBB)   . Low back pain   . Neuropathy   . NICM (nonischemic cardiomyopathy) ([place])    EF 30%, cath 05/20/2015 clean coronary.  EF resolved by echo with EF 55%  . OSA on CPAP   . Prediabetes   . Pulmonary HTN ([place]) 05/2015   Severe  with PASP 67/38mmHg - resolved on followup echo  . PVC's (premature ventricular contractions) 11/04/2015    Past Surgical History:  Procedure Laterality Date  . CARDIAC CATHETERIZATION N/A 05/20/2015   Procedure: Right/Left Heart Cath and Coronary Angiography;  Surgeon: Troy Sine, MD;  Location: [facility];  Service: Cardiovascular;  Laterality: N/A;  . CATARACT EXTRACTION W/ INTRAOCULAR LENS  IMPLANT, BILATERAL    . DILATION AND CURETTAGE OF UTERUS    . lumpectomy left breast    . UTERINE FIBROID SURGERY      Current Medications: Current Meds  Medication Sig  . acetaminophen (TYLENOL) 325 MG tablet Take 650 mg by mouth every 6 (six) hours as needed for mild pain.   Marland Kitchen aspirin 81 MG tablet Take 81 mg by mouth daily.  . carvedilol (COREG) 6.25 MG tablet Take 1 tablet (6.25 mg total) by mouth 2 (two) times daily with a meal.  . cholecalciferol (VITAMIN D) 1000 units tablet Take 1,000 Units by mouth daily.  . furosemide (LASIX) 40 MG tablet TAKE ONE TO TWO TABLETS BY MOUTH DAILY AS DIRECTED  . mometasone (NASONEX) 50 MCG/ACT nasal spray Place 2 sprays into the nose daily.  Marland Kitchen  Multiple Vitamin (MULTIVITAMIN) tablet Take 1 tablet by mouth daily.  . sacubitril-valsartan (ENTRESTO) 24-26 MG Take 1 tablet by mouth 2 (two) times daily.  Marland Kitchen spironolactone (ALDACTONE) 25 MG tablet Take 0.5 tablets (12.5 mg total) by mouth daily.  . vitamin C (ASCORBIC ACID) 500 MG tablet Take 500 mg by mouth as needed.   . [DISCONTINUED] carvedilol (COREG) 6.25 MG tablet Take 1 tablet (6.25 mg total) by mouth 2 (two) times daily with a meal.  . [DISCONTINUED] furosemide (LASIX) 40 MG tablet TAKE ONE TO TWO TABLETS BY MOUTH DAILY AS DIRECTED  . [DISCONTINUED] sacubitril-valsartan (ENTRESTO) 24-26 MG Take 1 tablet by mouth 2 (two) times daily.  . [DISCONTINUED] spironolactone (ALDACTONE) 25 MG tablet Take 0.5 tablets (12.5 mg total) by mouth daily.     Allergies:   Atorvastatin; Pravastatin sodium; and  Sulfa antibiotics   Social History   Socioeconomic History  . Marital status: Significant Other    Spouse name: Not on file  . Number of children: Not on file  . Years of education: Not on file  . Highest education level: Not on file  Occupational History  . 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 Financial resource strain: Not on file  . Food insecurity:    Worry: Not on file    Inability: Not on file  . Transportation needs:    Medical: Not on file    Non-medical: Not on file  Tobacco Use  . Smoking status: Former Smoker    Last attempt to quit: 06/18/2009    Years since quitting: 8.3  . Smokeless tobacco: Never Used  Substance and Sexual Activity  . Alcohol use: Yes    Comment: once every 2 months  . Drug use: No  . Sexual activity: Yes  Lifestyle  . Physical activity:    Days per week: Not on file    Minutes per session: Not on file  . Stress: Not on file  Relationships  . Social connections:    Talks on phone: Not on file    Gets together: Not on file    Attends religious service: Not on file    Active member of club or organization: Not on file    Attends meetings of clubs or organizations: Not on file    Relationship status: Not on file  Other Topics Concern  . Not on file  Social History Narrative  . Not on file     Family History: The patient's family history includes Alcohol abuse in her father; Arthritis in her mother; Cancer in her father and paternal grandmother; Colon cancer (age of onset: 71) in her mother; Depression in her father and mother; Drug abuse in her brother; Early death in her maternal grandmother; Hearing loss in her mother; Hyperlipidemia in her father and mother; Hypertension in her father; Miscarriages / [place] in her mother. There is no history of Heart attack.  ROS:   Please see the history of present illness.    ROS  All other systems reviewed and negative.   EKGs/Labs/Other Studies Reviewed:    The following studies  were reviewed today: PAP download   Recent Labs: 11/07/2016: B Natriuretic Peptide 24.6; Magnesium 1.7 09/27/2017: ALT 14; BUN 15; Creat 0.87; Hemoglobin 13.6; Platelets 326; Potassium 4.6; Sodium 139; TSH 0.89   Recent Lipid Panel    Component Value Date/Time   CHOL 257 (H) 09/27/2017 0957   CHOL 262 (H) 05/24/2017 1054   TRIG 157 (H) 09/27/2017 0957   HDL  59 09/27/2017 0957   HDL 57 05/24/2017 1054   CHOLHDL 4.4 09/27/2017 0957   VLDL 14 03/21/2016 1056   LDLCALC 168 (H) 09/27/2017 0957    Physical Exam:    VS:  BP 124/70   Pulse 83   Ht 5' 4.5" (1.638 m)   Wt 261 lb (118.4 kg)   SpO2 97%   BMI 44.11 kg/m     Wt Readings from Last 3 Encounters:  10/09/17 261 lb (118.4 kg)  09/27/17 261 lb (118.4 kg)  03/20/17 263 lb (119.3 kg)     GEN:  Well nourished, well developed in no acute distress HEENT: Normal NECK: No JVD; No carotid bruits LYMPHATICS: No lymphadenopathy CARDIAC: RRR, no murmurs, rubs, gallops RESPIRATORY:  Clear to auscultation without rales, wheezing or rhonchi  ABDOMEN: Soft, non-tender, non-distended MUSCULOSKELETAL:  No edema; No deformity  SKIN: Warm and dry NEUROLOGIC:  Alert and oriented x 3 PSYCHIATRIC:  Normal affect   ASSESSMENT:    1. Chronic diastolic heart failure ([place])   2. Nonischemic cardiomyopathy ([place])   3. Aortic stenosis, mild   4. Hypertensive heart disease with chronic diastolic congestive heart failure ([place])   5. OSA (obstructive sleep apnea)    PLAN:    In order of problems listed above:  1.  Chronic combined systolic/diastolic CHF -she appears euvolemic on exam today.  Her weight is stable.  She will continue on Entresto 24-26 mg twice daily, carvedilol 6.25 mg twice daily, spironolactone 12.5 mg daily and Lasix 40 mg daily.  Her creatinine was 0.87 and potassium 4.6 on 09/27/2017.  2.  NIDCM -echo 1 year ago showed mild LV dysfunction with EF 40 to 45% diffuse hypokinesis and grade 1 diastolic function.  Repeat 2D  echocardiogram to make sure LV function is stable.  3.  Mild AS -echocardiogram 09/13/2016 showed mild aortic stenosis with mean aortic valve gradient 9 mmHg.  4.  Hypertension with chronic diastolic CHF -she will continue on carvedilol 6.25 mg twice daily, Entresto 24-26 mg twice daily and Aldactone 12.5 mg daily.  She does have some mild lower extremity edema on exam which I think is attributed to her poor compliance with low-sodium diet.  We had an extremely long discussion today about diet and exercise.  She eats mainly fried foods at home and eats out a lot where she is probably getting a lot of her sodium load.  I told her she needs to make some major lifestyle modifications including working up to exercising aerobically 45 minutes a day as well as no fried food, limited sugar and no carbs.  She needs to increase the amount of fresh fruit and vegetables as well as fish in her diet.  5.  OSA - the patient is tolerating PAP therapy well without any problems. The PAP download was reviewed today and showed an AHI of 2.3/hr on 13 cm H2O with 77% compliance in using more than 4 hours nightly.  The patient has been using and benefiting from PAP use and will continue to benefit from therapy.  She will get a new CPAP machine this week broke.  I will order her new ResMed CPAP at 13 cm H2O with humidity.  She will need to follow-up with me in 10 weeks to document compliance.  I have spent a total of over 45 minutes with patient reviewing prior cardiac cath and echo with her and her friend , EKGs, labs including recent lipids and examining patient as well as establishing an assessment  and plan that was discussed with the patient.  > 50% of time was spent in direct patient care.  A large part of the office visit was spent counseling the patient and her friend on her heart healthy diet.  We went into a lengthy discussion of what is an adequate exercise level for her in order to lose a significant amount of weight as  well as proper diet.  They eat out most of the time and usually eat fried food.  The patient and her partner had a significant amount of questions but took quite a while to answer in detail.     Medication Adjustments/Labs and Tests Ordered: Current medicines are reviewed at length with the patient today.  Concerns regarding medicines are outlined above.  No orders of the defined types were placed in this encounter.  Meds ordered this encounter  Medications  . carvedilol (COREG) 6.25 MG tablet    Sig: Take 1 tablet (6.25 mg total) by mouth 2 (two) times daily with a meal.    Dispense:  180 tablet    Refill:  3  . ezetimibe (ZETIA) 10 MG tablet    Sig: Take 1 tablet (10 mg total) by mouth daily.    Dispense:  30 tablet    Refill:  11  . furosemide (LASIX) 40 MG tablet    Sig: TAKE ONE TO TWO TABLETS BY MOUTH DAILY AS DIRECTED    Dispense:  180 tablet    Refill:  3  . sacubitril-valsartan (ENTRESTO) 24-26 MG    Sig: Take 1 tablet by mouth 2 (two) times daily.    Dispense:  180 tablet    Refill:  3  . spironolactone (ALDACTONE) 25 MG tablet    Sig: Take 0.5 tablets (12.5 mg total) by mouth daily.    Dispense:  90 tablet    Refill:  3    Signed, Fransico Him, MD  10/09/2017 1:24 PM    [facility]

## 2017-10-09 NOTE — Telephone Encounter (Signed)
-----   Message from Joanna Campbell, LPN sent at [phone]  2:03 PM EDT ----- PT NEEDS  RESMED CPAP AT 13 CM [place]

## 2017-10-09 NOTE — Patient Instructions (Addendum)
Your physician recommends that you continue on your current medications as directed. Please refer to the Current Medication list given to you today.   Your physician has requested that you have an echocardiogram. Echocardiography is a painless test that uses sound waves to create images of your heart. It provides your doctor with information about the size and shape of your heart and how well your heart's chambers and valves are working. This procedure takes approximately one hour. There are no restrictions for this procedure.  Your physician recommends that you schedule a follow-up appointment in: 6 MONTHS WITH APP  10 WEEKS WITH DR TURNER NINA WILL CALL WITH APPT

## 2017-10-09 NOTE — Telephone Encounter (Signed)
Order placed to [facility]  

## 2017-10-13 ENCOUNTER — Other Ambulatory Visit: Payer: Self-pay

## 2017-10-13 ENCOUNTER — Ambulatory Visit (HOSPITAL_COMMUNITY): Payer: Managed Care, Other (non HMO) | Attending: Cardiology

## 2017-10-13 DIAGNOSIS — I429 Cardiomyopathy, unspecified: Secondary | ICD-10-CM | POA: Insufficient documentation

## 2017-10-13 DIAGNOSIS — I447 Left bundle-branch block, unspecified: Secondary | ICD-10-CM | POA: Insufficient documentation

## 2017-10-13 DIAGNOSIS — E669 Obesity, unspecified: Secondary | ICD-10-CM | POA: Insufficient documentation

## 2017-10-13 DIAGNOSIS — E785 Hyperlipidemia, unspecified: Secondary | ICD-10-CM | POA: Diagnosis not present

## 2017-10-13 DIAGNOSIS — I350 Nonrheumatic aortic (valve) stenosis: Secondary | ICD-10-CM | POA: Diagnosis not present

## 2017-10-13 DIAGNOSIS — M545 Low back pain: Secondary | ICD-10-CM | POA: Diagnosis not present

## 2017-10-13 DIAGNOSIS — I5032 Chronic diastolic (congestive) heart failure: Secondary | ICD-10-CM

## 2017-10-13 DIAGNOSIS — I110 Hypertensive heart disease with heart failure: Secondary | ICD-10-CM | POA: Diagnosis not present

## 2017-10-13 DIAGNOSIS — I509 Heart failure, unspecified: Secondary | ICD-10-CM | POA: Insufficient documentation

## 2017-10-13 MED ORDER — PERFLUTREN LIPID MICROSPHERE
1.0000 mL | INTRAVENOUS | Status: AC | PRN
  Administered 2017-10-13: 3 mL via INTRAVENOUS

## 2017-10-23 ENCOUNTER — Telehealth: Payer: Self-pay | Admitting: Cardiology

## 2017-10-23 NOTE — Telephone Encounter (Signed)
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 Radford Pax & per patient, anyone I can get to sign the form, her medcard expired on Saturday & she cannot work without it. I rec'd the form from the patient, printed the echo & placed in Dr. Theodosia Blender box for signature.  Patient called again this morning wanting the status & I let her know it was waiting on provider signature. She will call again & ask for Dr. Theodosia Blender nurse.

## 2017-10-23 NOTE — Telephone Encounter (Signed)
Will forward to Dr Radford Pax and RN for further review and status update on forms requested for completion.  The office will follow-up accordingly thereafter.

## 2017-10-23 NOTE — Telephone Encounter (Signed)
Forms completed and given back to medical records Rep Curt Bears.  Curt Bears to complete her process and follow-up with the pt thereafter.

## 2017-10-23 NOTE — Telephone Encounter (Signed)
° °  Patient is requesting status, has Dr Radford Pax signed forms. Patient wants to pick up forms today

## 2017-11-27 ENCOUNTER — Encounter: Payer: Self-pay | Admitting: Family Medicine

## 2017-12-18 ENCOUNTER — Telehealth: Payer: Self-pay | Admitting: *Deleted

## 2017-12-18 MED ORDER — MOMETASONE FUROATE 50 MCG/ACT NA SUSP: 2.0000 | Freq: Every day | NASAL | 12 refills | Status: DC

## 2017-12-18 NOTE — Telephone Encounter (Signed)
Received PA determination.   PA [id number] approved 11/18/2017- 12/18/2018.  Pharmacy made aware.

## 2017-12-18 NOTE — Telephone Encounter (Signed)
[employer] is reviewing your PA request and will respond within 24 hours for Medicaid or up to 72 hours for non-Medicaid plans, based on the required timeframe determined by state or federal regulations. To check for an update later, open this request from your dashboard.

## 2017-12-18 NOTE — Telephone Encounter (Signed)
Received request from pharmacy for PA on Mometasone (Nasonex)   PA submitted.   Dx: allergies

## 2018-01-24 ENCOUNTER — Encounter: Payer: Self-pay | Admitting: Family Medicine

## 2018-01-24 ENCOUNTER — Ambulatory Visit
Admission: RE | Admit: 2018-01-24 | Discharge: 2018-01-24 | Disposition: A | Discharge status: Home / Self Care | Payer: Managed Care, Other (non HMO) | Source: Ambulatory Visit | Attending: Family Medicine | Admitting: Family Medicine

## 2018-01-24 ENCOUNTER — Ambulatory Visit: Payer: Managed Care, Other (non HMO) | Admitting: Family Medicine

## 2018-01-24 VITALS — BP 102/70 | HR 77 | Temp 97.6°F | Resp 16 | Ht 64.5 in | Wt 242.1 lb

## 2018-01-24 DIAGNOSIS — E119 Type 2 diabetes mellitus without complications: Secondary | ICD-10-CM | POA: Diagnosis not present

## 2018-01-24 DIAGNOSIS — M25562 Pain in left knee: Secondary | ICD-10-CM

## 2018-01-24 DIAGNOSIS — R319 Hematuria, unspecified: Secondary | ICD-10-CM

## 2018-01-24 DIAGNOSIS — M25462 Effusion, left knee: Secondary | ICD-10-CM

## 2018-01-24 DIAGNOSIS — Z23 Encounter for immunization: Secondary | ICD-10-CM

## 2018-01-24 DIAGNOSIS — E782 Mixed hyperlipidemia: Secondary | ICD-10-CM

## 2018-01-24 DIAGNOSIS — M79605 Pain in left leg: Secondary | ICD-10-CM

## 2018-01-24 DIAGNOSIS — M1712 Unilateral primary osteoarthritis, left knee: Secondary | ICD-10-CM

## 2018-01-24 LAB — URINALYSIS, ROUTINE W REFLEX MICROSCOPIC
Bacteria, UA: NONE SEEN /HPF
Bilirubin Urine: NEGATIVE
GLUCOSE, UA: NEGATIVE
Ketones, ur: NEGATIVE
LEUKOCYTES UA: NEGATIVE
Nitrite: NEGATIVE
PH: 5.5 (ref 5.0–8.0)
PROTEIN: NEGATIVE
SPECIFIC GRAVITY, URINE: 1.032 (ref 1.001–1.03)
WBC UA: NONE SEEN /HPF (ref 0–5)

## 2018-01-24 LAB — MICROSCOPIC MESSAGE

## 2018-01-24 MED ORDER — GABAPENTIN 100 MG PO CAPS: 100.0000 mg | ORAL_CAPSULE | Freq: Two times a day (BID) | ORAL | 0 refills | Status: DC

## 2018-01-24 NOTE — Patient Instructions (Addendum)
For your knee - get the xray done, wear the brace or knee sleeve, and rest as much as you can, apply ice several times a day.  I will need to review your cardiology notes and see what we can give you for the inflammation that will be safe.  I will call you with your labs.    Work note gives you a week of restrictions specific to your leg.   You can get the xray done today.  If you need an ortho referral we will have to work that out in the future with scheduling and your work schedule.    Knee Pain, Adult Many things can cause knee pain. The pain often goes away on its own with time and rest. If the pain does not go away, tests may be done to find out what is causing the pain. Follow these instructions at home: Activity  Rest your knee.  Do not do things that cause pain.  Avoid activities where both feet leave the ground at the same time (high-impact activities). Examples are running, jumping rope, and doing jumping jacks. General instructions  Take medicines only as told by your doctor.  Raise (elevate) your knee when you are resting. Make sure your knee is higher than your heart.  Sleep with a pillow under your knee.  If told, put ice on the knee: ? Put ice in a plastic bag. ? Place a towel between your skin and the bag. ? Leave the ice on for 20 minutes, 2-3 times a day.  Ask your doctor if you should wear an elastic knee support.  Lose weight if you are overweight. Being overweight can make your knee hurt more.  Do not use any tobacco products. These include cigarettes, chewing tobacco, or electronic cigarettes. If you need help quitting, ask your doctor. Smoking may slow down healing. Contact a doctor if:  The pain does not stop.  The pain changes or gets worse.  You have a fever along with knee pain.  Your knee gives out or locks up.  Your knee swells, and becomes worse. Get help right away if:  Your knee feels warm.  You cannot move your knee.  You have  very bad knee pain.  You have chest pain.  You have trouble breathing. Summary  Many things can cause knee pain. The pain often goes away on its own with time and rest.  Avoid activities that put stress on your knee. These include running and jumping rope.  Get help right away if you cannot move your knee, or if your knee feels warm, or if you have trouble breathing. This information is not intended to replace advice given to you by your health care provider. Make sure you discuss any questions you have with your health care provider. Document Released: 04/29/2008 Document Revised: 01/26/2016 Document Reviewed: 01/26/2016 Elsevier Interactive Patient Education  2017 [place] for Routine Care of Injuries Many injuries can be cared for using rest, ice, compression, and elevation (RICE therapy). Using RICE therapy can help to lessen pain and swelling. It can help your body to heal. Rest Reduce your normal activities and avoid using the injured part of your body. You can go back to your normal activities when you feel okay and your doctor says it is okay. Ice Do not put ice on your bare skin.  Put ice in a plastic bag.  Place a towel between your skin and the bag.  Leave the ice  on for 20 minutes, 2-3 times a day.  Do this for as long as told by your doctor. Compression Compression means putting pressure on the injured area. This can be done with an elastic bandage. If an elastic bandage has been applied:  Remove and reapply the bandage every 3-4 hours or as told by your doctor.  Make sure the bandage is not wrapped too tight. Wrap the bandage more loosely if part of your body beyond the bandage is blue, swollen, cold, painful, or loses feeling (numb).  See your doctor if the bandage seems to make your problems worse.  Elevation Elevation means keeping the injured area raised. Raise the injured area above your heart or the center of your chest if you can. When  should I get help? You should get help if:  You keep having pain and swelling.  Your symptoms get worse.  Get help right away if: You should get help right away if:  You have sudden bad pain at or below the area of your injury.  You have redness or more swelling around your injury.  You have tingling or numbness at or below the injury that does not go away when you take off the bandage.  This information is not intended to replace advice given to you by your health care provider. Make sure you discuss any questions you have with your health care provider. Document Released: 07/20/2007 Document Revised: 12/29/2015 Document Reviewed: 01/08/2014 Elsevier Interactive Patient Education  2017 [employer].

## 2018-01-24 NOTE — Progress Notes (Signed)
Patient ID: [name], female    DOB: 14-May-1955, 62 y.o.   MRN: [id number]  PCP: Susy Frizzle, MD  Chief Complaint  Patient presents with  . Leg Pain    Patient has c/o of left leg pain  . Hematuria    Patient had blood in urine with DOT physical wants [place] to check    Subjective:   [name] is a 62 y.o. female, presents to clinic with CC of left leg pain.  She has been working on diet and exercise, has been gradually walking longer longer distances and she recently got up to about 2 miles of walking per day when she began having left knee pain which has become very severe and constant over the past 5 days.  She states that it is constant aching and throbbing located to her left medial knee and behind her knee, it is worse at night when she is resting it and elevating it sometimes has been so severe it is been waking her from her sleep.  She feels that it may be swollen.  Is much worse with going up and down steps or with weightbearing activity.  She does not recall any injury or strain and has not previously had her knees evaluated.  Is frustrated because she was doing so well and trying to exercise.  She has been using a brace with Velcro straps it seems to help a small amount, not much improvement with over-the-counter medications.  She did take a friend's prescription medication -gabapentin-and she states this did help with the pain especially at night when the pain is more severe achy with some muscle cramps and was waking her up  She also wants a follow-up as she was directed by the provider who did her DOT physical.  There is incidental finding of hematuria in her urine, no history of that in recent urine samples here in clinic or with her recent complete physical exam few months ago.  She denies any urinary symptoms, denies urinary frequency, urgency, gross hematuria, flank pain, abdominal pain.  She was due to follow-up with her PCP for chronic conditions.  She states  she has not done so because she has been driving out of town more often but unable to make appointment.  Patient Active Problem List   Diagnosis Date Noted  . Influenza A 11/07/2016  . Prediabetes 11/07/2016  . PVC's (premature ventricular contractions) 11/04/2015  . Aortic stenosis, mild   . Heart palpitations 07/28/2015  . Morbid obesity ([place]) 05/23/2015  . Hypertensive heart disease   . Nonischemic cardiomyopathy ([place])   . NSVT (nonsustained ventricular tachycardia) ([place]) 05/21/2015  . Chronic diastolic heart failure ([place]) 05/20/2015  . OSA (obstructive sleep apnea) 01/02/2014  . LBBB (left bundle branch block) 01/02/2014  . Mixed hyperlipidemia 04/15/2010  . Personal history of malignant neoplasm of breast 04/15/2010     Prior to Admission medications   Medication Sig Start Date End Date Taking? Authorizing Provider  acetaminophen (TYLENOL) 325 MG tablet Take 650 mg by mouth every 6 (six) hours as needed for mild pain.    Yes [provider]  aspirin 81 MG tablet Take 81 mg by mouth daily.   Yes [provider]  carvedilol (COREG) 6.25 MG tablet Take 1 tablet (6.25 mg total) by mouth 2 (two) times daily with a meal. 10/09/17  Yes Turner, Traci R, MD  cholecalciferol (VITAMIN D) 1000 units tablet Take 1,000 Units by mouth daily.  Yes [provider]  furosemide (LASIX) 40 MG tablet TAKE ONE TO TWO TABLETS BY MOUTH DAILY AS DIRECTED 10/09/17  Yes Turner, Traci R, MD  mometasone (NASONEX) 50 MCG/ACT nasal spray Place 2 sprays into the nose daily. 12/18/17  Yes Delsa Grana, PA-C  Multiple Vitamin (MULTIVITAMIN) tablet Take 1 tablet by mouth daily.   Yes [provider]  sacubitril-valsartan (ENTRESTO) 24-26 MG Take 1 tablet by mouth 2 (two) times daily. 10/09/17  Yes Turner, Eber Hong, MD  spironolactone (ALDACTONE) 25 MG tablet Take 0.5 tablets (12.5 mg total) by mouth daily. 10/09/17  Yes Turner, Eber Hong, MD  vitamin C (ASCORBIC ACID) 500 MG tablet  Take 500 mg by mouth as needed.    Yes [provider]  ezetimibe (ZETIA) 10 MG tablet Take 1 tablet (10 mg total) by mouth daily. 10/09/17 01/07/18  Sueanne Margarita, MD     Allergies  Allergen Reactions  . Atorvastatin Other (See Comments)    REACTION: mylagias  . Pravastatin Sodium Other (See Comments)    REACTION: myalgias  . Sulfa Antibiotics Hives     Family History  Problem Relation Age of Onset  . Arthritis Mother   . Depression Mother   . Hearing loss Mother   . Hyperlipidemia Mother   . Miscarriages / [place] Mother   . Colon cancer Mother 44       2010  . Alcohol abuse Father   . Cancer Father        PROSTATE  . Depression Father   . Hypertension Father   . Hyperlipidemia Father   . Drug abuse Brother   . Early death Maternal Grandmother   . Cancer Paternal Grandmother   . Heart attack Neg Hx      Social History   Socioeconomic History  . Marital status: Significant Other    Spouse name: Not on file  . Number of children: Not on file  . Years of education: Not on file  . Highest education level: Not on file  Occupational History  . 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 Financial resource strain: Not on file  . Food insecurity:    Worry: Not on file    Inability: Not on file  . Transportation needs:    Medical: Not on file    Non-medical: Not on file  Tobacco Use  . Smoking status: Former Smoker    Last attempt to quit: 06/18/2009    Years since quitting: 8.6  . Smokeless tobacco: Never Used  Substance and Sexual Activity  . Alcohol use: Yes    Comment: once every 2 months  . Drug use: No  . Sexual activity: Yes  Lifestyle  . Physical activity:    Days per week: Not on file    Minutes per session: Not on file  . Stress: Not on file  Relationships  . Social connections:    Talks on phone: Not on file    Gets together: Not on file    Attends religious service: Not on file    Active member of club or organization: Not on  file    Attends meetings of clubs or organizations: Not on file    Relationship status: Not on file  . Intimate partner violence:    Fear of current or ex partner: Not on file    Emotionally abused: Not on file    Physically abused: Not on file    Forced sexual activity: Not on file  Other  Topics Concern  . Not on file  Social History Narrative  . Not on file     Review of Systems  Constitutional: Negative.   HENT: Negative.   Eyes: Negative.   Respiratory: Negative.   Cardiovascular: Negative.   Gastrointestinal: Negative.   Endocrine: Negative.   Genitourinary: Negative.   Musculoskeletal: Negative.   Skin: Negative.   Allergic/Immunologic: Negative.   Neurological: Negative.   Hematological: Negative.   Psychiatric/Behavioral: Negative.   All other systems reviewed and are negative.      Objective:    Vitals:   01/24/18 0805  BP: 102/70  Pulse: 77  Resp: 16  Temp: 97.6 F (36.4 C)  TempSrc: Oral  SpO2: 95%  Weight: 242 lb 2 oz (109.8 kg)  Height: 5' 4.5" (1.638 m)      Physical Exam Vitals signs and nursing note reviewed.  Constitutional:      General: She is not in acute distress.    Appearance: She is well-developed. She is obese. She is not toxic-appearing or diaphoretic.  HENT:     Head: Normocephalic and atraumatic.     Right Ear: External ear normal.     Left Ear: External ear normal.     Nose: Nose normal.     Mouth/Throat:     Mouth: Mucous membranes are moist.  Eyes:     General: No scleral icterus.       Right eye: No discharge.        Left eye: No discharge.     Conjunctiva/sclera: Conjunctivae normal.     Pupils: Pupils are equal, round, and reactive to light.  Neck:     Trachea: No tracheal deviation.  Cardiovascular:     Rate and Rhythm: Normal rate and regular rhythm.     Pulses: Normal pulses.     Heart sounds: No murmur. No friction rub. No gallop.   Pulmonary:     Effort: Pulmonary effort is normal. No respiratory  distress.     Breath sounds: Normal breath sounds. No stridor.  Abdominal:     General: Bowel sounds are normal. There is no distension.     Tenderness: There is no abdominal tenderness.  Musculoskeletal:     Left knee: She exhibits decreased range of motion and swelling. She exhibits no effusion (no obvious effusion, but exam limited by body habitus), no deformity, no erythema and normal alignment. Tenderness (ttp to popliteal fossa) found. Medial joint line, MCL and patellar tendon tenderness noted.     Comments: Pt would not tolerate meniscal testing or anterior drawer testing, pushed my hands away with palpation of anterior knee  Skin:    General: Skin is warm and dry.     Capillary Refill: Capillary refill takes less than 2 seconds.     Findings: No erythema or rash.  Neurological:     Mental Status: She is alert.     Sensory: No sensory deficit.     Motor: No weakness or abnormal muscle tone.     Coordination: Coordination normal.     Gait: Gait abnormal.  Psychiatric:        Mood and Affect: Mood normal.        Behavior: Behavior normal.           Assessment & Plan:      ICD-10-CM   1. Acute pain of left knee M25.562 DG Knee Complete 4 Views Left    Ambulatory referral to Orthopedic Surgery  2. Hematuria, unspecified type R31.9 Urinalysis,  Routine w reflex microscopic    CANCELED: CBC (INCLUDES DIFF/PLT) WITH PATHOLOGIST REVIEW  3. Type 2 diabetes mellitus without complication, without long-term current use of insulin (HCC) B93.9 COMPLETE METABOLIC PANEL WITH GFR    Lipid panel    Hemoglobin A1c    Microalbumin, urine  4. Mixed hyperlipidemia Q30.0 COMPLETE METABOLIC PANEL WITH GFR    Lipid panel    Hemoglobin A1c  5. Morbid obesity (HCC) P23.30 COMPLETE METABOLIC PANEL WITH GFR    Lipid panel    Hemoglobin A1c  6. Pain of left lower extremity M79.605 Magnesium & CMP for described muscle cramps - gabapentin low dose trial for associated pain.  7. Need for  influenza vaccination Z23 Flu Vaccine QUAD 6+ mos PF IM (Fluarix Quad PF)    Pt with 5 days of acute knee pain, constant, worse at night, more severe behind left knee and with straightening leg.  She denies injury  I am concerned for meniscal injury based on her physical exam for some arthritis and inflammation in the knee that flared up after increasing exercise.  Physical exam is somewhat limited by body habitus but full extension of her leg is very painful and so is palpation to the popliteal fossa.  Would like to obtain x-ray to evaluate the joint spaces, start with conservative management with rice therapy, work note provided for her for decreasing strenuous activity or heavy lifting, she will need to be reevaluated and could possibly need orthopedic evaluation if it does not improve with conservative management.   She did try a friend's gabapentin and she stated that it was effective for her pain which was much worse at night - will give low dose to try - esp since she endorses intermittent tingling in her toes and it would be appropriate for DM associated peripheral neuropathy.  Patient did have routine labs with a complete physical about 4 months ago was pertinent for hyperlipidemia and type 2 diabetes she never returned to discuss and start treatment, will recheck that today she states she has been working on her diet and exercise, so we will recheck labs.  She also has had hematuria with her DOT physicals and she was encouraged to follow-up with her PCP, will screen urine.  She has no urinary complaints.  Past UA's were negative for any hematuria. Today UA did have trace blood and calcium oxalate crystals.  Since in our medical record here she is not previously had hematuria will recheck this at next routine visit.  Unclear significance of calcium oxalate crystals in urine, no sx of nephrolithiasis. will Monitor  Delsa Grana, PA-C 01/24/18 8:40 AM

## 2018-01-25 LAB — COMPLETE METABOLIC PANEL WITH GFR
AG Ratio: 1.6 (calc) (ref 1.0–2.5)
ALBUMIN MSPROF: 4.2 g/dL (ref 3.6–5.1)
ALT: 13 U/L (ref 6–29)
AST: 11 U/L (ref 10–35)
Alkaline phosphatase (APISO): 59 U/L (ref 33–130)
BILIRUBIN TOTAL: 0.4 mg/dL (ref 0.2–1.2)
BUN: 14 mg/dL (ref 7–25)
CALCIUM: 9.6 mg/dL (ref 8.6–10.4)
CO2: 27 mmol/L (ref 20–32)
Chloride: 104 mmol/L (ref 98–110)
Creat: 0.8 mg/dL (ref 0.50–0.99)
GFR, EST NON AFRICAN AMERICAN: 78 mL/min/{1.73_m2} (ref >=60)
GFR, Est African American: 91 mL/min/{1.73_m2} (ref >=60)
GLOBULIN: 2.6 g/dL (ref 1.9–3.7)
Glucose, Bld: 107 mg/dL — ABNORMAL HIGH (ref 65–99)
POTASSIUM: 4.3 mmol/L (ref 3.5–5.3)
Sodium: 140 mmol/L (ref 135–146)
Total Protein: 6.8 g/dL (ref 6.1–8.1)

## 2018-01-25 LAB — HEMOGLOBIN A1C
EAG (MMOL/L): 7.3 (calc)
Hgb A1c MFr Bld: 6.2 % of total Hgb — ABNORMAL HIGH (ref <5.7)
MEAN PLASMA GLUCOSE: 131 (calc)

## 2018-01-25 LAB — MAGNESIUM: Magnesium: 1.9 mg/dL (ref 1.5–2.5)

## 2018-01-25 LAB — MICROALBUMIN, URINE: MICROALB UR: 0.6 mg/dL

## 2018-01-25 LAB — LIPID PANEL
Cholesterol: 210 mg/dL — ABNORMAL HIGH (ref <200)
HDL: 65 mg/dL (ref >50)
LDL CHOLESTEROL (CALC): 128 mg/dL — AB
Non-HDL Cholesterol (Calc): 145 mg/dL (calc) — ABNORMAL HIGH (ref <130)
TRIGLYCERIDES: 75 mg/dL (ref <150)
Total CHOL/HDL Ratio: 3.2 (calc) (ref <5.0)

## 2018-01-25 MED ORDER — PREDNISONE 20 MG PO TABS: ORAL_TABLET | ORAL | 0 refills | Status: DC

## 2018-02-12 ENCOUNTER — Ambulatory Visit (INDEPENDENT_AMBULATORY_CARE_PROVIDER_SITE_OTHER): Payer: Managed Care, Other (non HMO) | Admitting: Specialist

## 2018-02-25 ENCOUNTER — Other Ambulatory Visit: Payer: Self-pay | Admitting: Cardiology

## 2018-02-26 ENCOUNTER — Other Ambulatory Visit: Payer: Self-pay | Admitting: Cardiology

## 2018-02-26 MED ORDER — EZETIMIBE 10 MG PO TABS: 10.0000 mg | ORAL_TABLET | Freq: Every day | ORAL | 7 refills | Status: DC

## 2018-02-27 ENCOUNTER — Encounter (INDEPENDENT_AMBULATORY_CARE_PROVIDER_SITE_OTHER): Payer: Self-pay

## 2018-02-27 ENCOUNTER — Ambulatory Visit (INDEPENDENT_AMBULATORY_CARE_PROVIDER_SITE_OTHER): Payer: Managed Care, Other (non HMO) | Admitting: Specialist

## 2018-02-27 ENCOUNTER — Encounter (INDEPENDENT_AMBULATORY_CARE_PROVIDER_SITE_OTHER): Payer: Self-pay | Admitting: Specialist

## 2018-02-27 VITALS — BP 122/77 | HR 71 | Ht 64.5 in | Wt 242.0 lb

## 2018-02-27 DIAGNOSIS — R200 Anesthesia of skin: Secondary | ICD-10-CM | POA: Diagnosis not present

## 2018-02-27 DIAGNOSIS — M5416 Radiculopathy, lumbar region: Secondary | ICD-10-CM

## 2018-02-27 DIAGNOSIS — R202 Paresthesia of skin: Secondary | ICD-10-CM

## 2018-02-27 DIAGNOSIS — M1712 Unilateral primary osteoarthritis, left knee: Secondary | ICD-10-CM | POA: Diagnosis not present

## 2018-02-27 MED ORDER — LIDOCAINE HCL 1 % IJ SOLN
3.0000 mL | INTRAMUSCULAR | Status: AC | PRN
  Administered 2018-02-27: 3 mL

## 2018-02-27 NOTE — Progress Notes (Signed)
Office Visit Note   Patient: [name]           Date of Birth: 07-02-1955           MRN: [id number] Visit Date: 02/27/2018              Requested by: Joanna Grana, PA-C [id number] [place] [address] PCP: Joanna Frizzle, MD   Assessment & Plan: Visit Diagnoses:  1. Arthritis of left knee   2. Numbness and tingling in both hands   3. Radiculopathy, lumbar region     Plan: In hopes of giving patient some relief of her knee pain offered injection.  After patient consent left knee was prepped with Betadine and intra-articular Marcaine/Depo-Medrol injection was performed from a superior lateral patellar approach.  After sitting for about 5 to 10 minutes patient reported very good relief with Marcaine in place.  Follow-up me in 3 weeks for recheck.  Patient understands that with the degenerative changes that she has that ultimately it will come down her needing total knee replacement but we are hoping to be able to exhaust all conservative measures since she is only had an increase in her symptoms for the last 2 months.  We also discussed possibly trying visco supplementation series depending upon her response to the cortisone injection.  Regards to the bilateral hand chronic numbness and tingling that she has.  She did mention previous documented history of carpal tunnel syndrome.  We may get NCV/EMG study bilateral upper extremities when she returns.  I will also plan to get a x-ray of her cervical spine.  Patient given a note keeping her out of work until next Monday.  All questions answered.  Follow-Up Instructions: Return in about 3 weeks (around 03/20/2018) for with [name] recheck left knee and bilat hand numbness/tingling.   Orders:  No orders of the defined types were placed in this encounter.  No orders of the defined types were placed in this encounter.     Procedures: Large Joint Inj: L knee on 02/27/2018 11:20 AM Indications: pain Details: 22 G 1.5 in needle,  superolateral approach Medications: 3 mL lidocaine 1 % Aspirate: serous Consent was given by the patient. Patient was prepped and draped in the usual sterile fashion.       Clinical Data: No additional findings.   Subjective: Chief Complaint  Patient presents with  . Left Knee - Pain    HPI 63 year old white female who is new patient to the clinic comes in today with complaints of left knee pain.  States that she has had chronic off-and-on aches and pains in the left knee for several years but this is been worse x2 months.  She describes having sharp pain at times when she goes to stand.  Some feeling mechanical symptoms.  States that after she gets going sometimes knee pain and mechanical symptoms improved.  She describes having intermittent pain in the left thigh that goes down towards the ankle.  Pain also in the posterior knee.  She did have x-rays ordered by her primary care physician January 24, 2018 and report showed interval moderate to marked medial joint space narrowing with moderate to marked spur formation.  Moderate to marked patellofemoral degenerative changes with joint space narrowing spur formation.  Mild lateral spur formation.  Small to moderate size effusion.  No fracture or dislocation.  She has not had any injections in the knee.  States that cardiologist told her that she cannot  take any oral NSAIDs.  During today's visit patient also brought up issues with chronic numbness and tingling in both hands.  States that she was diagnosed with carpal tunnel syndrome several years ago.  She has not had surgery for this.  Thinks that she has had nerve conduction studies.  Gets numbness and tingling in the thumb index and long finger both hands.  She also admits to having some neck pain with numbness and tingling down the shoulder into the forearm and hand on the right. Review of Systems No current cardiac pulmonary GI GU issues  Objective: Vital Signs: BP 122/77   Pulse 71    Ht 5' 4.5" (1.638 m)   Wt 242 lb (109.8 kg)   BMI 40.90 kg/m   Physical Exam HENT:     Head: Normocephalic.  Pulmonary:     Effort: Pulmonary effort is normal.     Breath sounds: Normal breath sounds.  Musculoskeletal:     Comments: Gait is antalgic.  Bilateral wrist positive Tinel's over the carpal tunnel.  Positive bilateral Phalen's.  No gross thenar atrophy.  Bilateral elbows good range of motion.  Negative Tinel's over the bilateral cubital tunnels.  Negative logroll bilateral hips.  Negative straight leg raise.  Left knee positive patellofemoral crepitus.  Medial greater than lateral joint line tenderness.  Ligaments are stable.  She does have some knee swelling without large effusion.  Tender Baker's cyst.  Bilateral calves nontender.  Right knee good range of motion.  Does have some tenderness at the medial lateral joint line but in the left side.  Skin:    General: Skin is warm and dry.  Neurological:     General: No focal deficit present.     Mental Status: She is alert and oriented to person, place, and time.  Psychiatric:        Mood and Affect: Mood normal.        Behavior: Behavior normal.     Ortho Exam  Specialty Comments:  No specialty comments available.  Imaging: No results found.   PMFS History: Patient Active Problem List   Diagnosis Date Noted  . Influenza A 11/07/2016  . Prediabetes 11/07/2016  . PVC's (premature ventricular contractions) 11/04/2015  . Aortic stenosis, mild   . Heart palpitations 07/28/2015  . Morbid obesity ([place]) 05/23/2015  . Hypertensive heart disease   . Nonischemic cardiomyopathy ([place])   . NSVT (nonsustained ventricular tachycardia) ([place]) 05/21/2015  . Chronic diastolic heart failure ([place]) 05/20/2015  . OSA (obstructive sleep apnea) 01/02/2014  . LBBB (left bundle branch block) 01/02/2014  . Mixed hyperlipidemia 04/15/2010  . Personal history of malignant neoplasm of breast 04/15/2010   Past Medical History:  Diagnosis  Date  . Anxiety   . Aortic stenosis, mild    echo 2017  . Breast cancer ([place])    left breast  . Carpal tunnel syndrome   . Cataract   . Chronic diastolic CHF (congestive heart failure) (HCC)    NYHA class II  . Hyperlipidemia   . Hypertension   . Left bundle branch block (LBBB)   . Low back pain   . Neuropathy   . NICM (nonischemic cardiomyopathy) ([place])    EF 30%, cath 05/20/2015 clean coronary.  EF resolved by echo with EF 55%  . OSA on CPAP   . Prediabetes   . Pulmonary HTN ([place]) 05/2015   Severe with PASP 67/44mmHg - resolved on followup echo  . PVC's (premature ventricular contractions) 11/04/2015  Family History  Problem Relation Age of Onset  . Arthritis Mother   . Depression Mother   . Hearing loss Mother   . Hyperlipidemia Mother   . Miscarriages / [place] Mother   . Colon cancer Mother 10       2010  . Alcohol abuse Father   . Cancer Father        PROSTATE  . Depression Father   . Hypertension Father   . Hyperlipidemia Father   . Drug abuse Brother   . Early death Maternal Grandmother   . Cancer Paternal Grandmother   . Heart attack Neg Hx     Past Surgical History:  Procedure Laterality Date  . CARDIAC CATHETERIZATION N/A 05/20/2015   Procedure: Right/Left Heart Cath and Coronary Angiography;  Surgeon: Troy Sine, MD;  Location: [facility];  Service: Cardiovascular;  Laterality: N/A;  . CATARACT EXTRACTION W/ INTRAOCULAR LENS  IMPLANT, BILATERAL    . DILATION AND CURETTAGE OF UTERUS    . lumpectomy left breast    . UTERINE FIBROID SURGERY     Social History   Occupational History  . Occupation: Truck Geophysicist/field seismologist  Tobacco Use  . Smoking status: Former Smoker    Last attempt to quit: 06/18/2009    Years since quitting: 8.7  . Smokeless tobacco: Never Used  Substance and Sexual Activity  . Alcohol use: Yes    Comment: once every 2 months  . Drug use: No  . Sexual activity: Yes

## 2018-03-21 ENCOUNTER — Encounter: Payer: Self-pay | Admitting: Cardiology

## 2018-03-21 ENCOUNTER — Ambulatory Visit (INDEPENDENT_AMBULATORY_CARE_PROVIDER_SITE_OTHER): Payer: Managed Care, Other (non HMO) | Admitting: Cardiology

## 2018-03-21 VITALS — BP 120/72 | HR 80 | Ht 64.0 in | Wt 247.0 lb

## 2018-03-21 DIAGNOSIS — G4733 Obstructive sleep apnea (adult) (pediatric): Secondary | ICD-10-CM | POA: Diagnosis not present

## 2018-03-21 DIAGNOSIS — I350 Nonrheumatic aortic (valve) stenosis: Secondary | ICD-10-CM

## 2018-03-21 NOTE — Progress Notes (Signed)
03/21/2018 [name]   [birth year]  [id number]  Primary Physician Pickard, Cammie Mcgee, MD Primary Cardiologist: No primary care provider on file.  Electrophysiologist: None   Reason for Visit/CC: f/u for chronic combined systolic and diastolic HF, mild AS and HTN  HPI:  [name] is a 63 y.o. female, followed by Dr. Radford Pax, with a hx of DCM with EF 45%, HTN, LBBB, obesity and obstructive sleep apnea, on CPAP. Stress test in 2015 with inf and ant wall defects and normal coronary arteries by cath EF 30-35% and severe pulmonary HTN (PASP 67/34mmHg) and NSVT noted on Tele at time of hospitalization for cath. She had a 2D echo in August 2018 which showed mildly reduced LVF with EF 40-45% with diffuse HK. Mild AS. Mean AV gradient was 13 mmHg.  She also has a history of PACs and PVCs by event monitor and chronic lower extremity edema. She lives a relatively sendenatary lifestyle. She works as a Recruitment consultant for [employer].   She presents to clinic today for routine f/u.  She reports that she has done well from a cardiac standpoint.  No cardiac symptoms.  She denies chest pain, dyspnea, lower extremity edema.  Since her last office visit she has tried to make dietary changes.  She adopted the whole 30 diet, cutting out sugar and carbs.  She has noticed some weight loss with this.  She was also walking up to 2 miles a day however recently developed knee pain and her PCP referred her to orthopedic doctor.  She had x-rays and was diagnosed with osteoarthritis.  For now she is planning conservative therapy.  She has been getting knee injections with good subjective improvement thus far.  She may ultimately need knee replacement surgery but not in the near future.  She avoids NSAIDs.  She has been taking acetaminophen for arthritis pain.  She reports full compliance with her CPAP.  She recently got a new mask and reports that she has been receiving notifications of a air leak.  EKG today shows chronic  left bundle branch block.  Heart rate is well controlled at 77 bpm.  Blood pressures well controlled today at 120/72.  She had laboratory work done in December which showed normal renal function and potassium.  She is on Entresto and spironolactone as well as Lasix.   Cardiac Studies   2D Echo 09/2017 Study Conclusions  - Left ventricle: The cavity size was normal. Systolic function was   mildly to moderately reduced. The estimated ejection fraction was   in the range of 40% to 45%. Mild diffuse hypokinesis with no   identifiable regional variations. Features are consistent with a   pseudonormal left ventricular filling pattern, with concomitant   abnormal relaxation and increased filling pressure (grade 2   diastolic dysfunction). Doppler parameters are consistent with   high ventricular filling pressure. - Aortic valve: Poorly visualized. There was mild stenosis. Mean   gradient (S): 13 mm Hg. Valve area (VTI): 1.5 cm^2. Valve area   (Vmax): 1.45 cm^2. Valve area (Vmean): 1.31 cm^2. - Mitral valve: Calcified annulus. - Left atrium: The atrium was mildly dilated. - Pulmonary arteries: Systolic pressure could not be accurately   estimated.  Current Meds  Medication Sig  . acetaminophen (TYLENOL 8 HOUR ARTHRITIS PAIN) 650 MG CR tablet Take 1,300 mg by mouth as needed for pain.  Marland Kitchen aspirin 81 MG tablet Take 81 mg by mouth daily.  . carvedilol (COREG) 6.25 MG tablet Take 1  tablet (6.25 mg total) by mouth 2 (two) times daily with a meal.  . cholecalciferol (VITAMIN D) 1000 units tablet Take 1,000 Units by mouth daily.  Marland Kitchen ezetimibe (ZETIA) 10 MG tablet TAKE 1 TABLET BY MOUTH ONCE DAILY  . furosemide (LASIX) 40 MG tablet TAKE ONE TO TWO TABLETS BY MOUTH DAILY AS DIRECTED  . gabapentin (NEURONTIN) 100 MG capsule Take 1 capsule (100 mg total) by mouth 2 (two) times daily.  . mometasone (NASONEX) 50 MCG/ACT nasal spray Place 2 sprays into the nose daily.  . Multiple Vitamin (MULTIVITAMIN)  tablet Take 1 tablet by mouth daily.  . sacubitril-valsartan (ENTRESTO) 24-26 MG Take 1 tablet by mouth 2 (two) times daily.  Marland Kitchen spironolactone (ALDACTONE) 25 MG tablet Take 0.5 tablets (12.5 mg total) by mouth daily.  . vitamin C (ASCORBIC ACID) 500 MG tablet Take 500 mg by mouth as needed.    Allergies  Allergen Reactions  . Atorvastatin Other (See Comments)    REACTION: mylagias  . Pravastatin Sodium Other (See Comments)    REACTION: myalgias  . Sulfa Antibiotics Hives   Past Medical History:  Diagnosis Date  . Anxiety   . Aortic stenosis, mild    echo 2017  . Breast cancer ([place])    left breast  . Carpal tunnel syndrome   . Cataract   . Chronic diastolic CHF (congestive heart failure) (HCC)    NYHA class II  . Hyperlipidemia   . Hypertension   . Left bundle branch block (LBBB)   . Low back pain   . Neuropathy   . NICM (nonischemic cardiomyopathy) ([place])    EF 30%, cath 05/20/2015 clean coronary.  EF resolved by echo with EF 55%  . OSA on CPAP   . Prediabetes   . Pulmonary HTN ([place]) 05/2015   Severe with PASP 67/35mmHg - resolved on followup echo  . PVC's (premature ventricular contractions) 11/04/2015   Family History  Problem Relation Age of Onset  . Arthritis Mother   . Depression Mother   . Hearing loss Mother   . Hyperlipidemia Mother   . Miscarriages / [place] Mother   . Colon cancer Mother 76       2010  . Alcohol abuse Father   . Cancer Father        PROSTATE  . Depression Father   . Hypertension Father   . Hyperlipidemia Father   . Drug abuse Brother   . Early death Maternal Grandmother   . Cancer Paternal Grandmother   . Heart attack Neg Hx    Past Surgical History:  Procedure Laterality Date  . CARDIAC CATHETERIZATION N/A 05/20/2015   Procedure: Right/Left Heart Cath and Coronary Angiography;  Surgeon: Troy Sine, MD;  Location: [facility];  Service: Cardiovascular;  Laterality: N/A;  . CATARACT EXTRACTION W/ INTRAOCULAR LENS   IMPLANT, BILATERAL    . DILATION AND CURETTAGE OF UTERUS    . lumpectomy left breast    . UTERINE FIBROID SURGERY     Social History   Socioeconomic History  . Marital status: Significant Other    Spouse name: Not on file  . Number of children: Not on file  . Years of education: Not on file  . Highest education level: Not on file  Occupational History  . 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 Financial resource strain: Not on file  . Food insecurity:    Worry: Not on file    Inability: Not on file  .  Transportation needs:    Medical: Not on file    Non-medical: Not on file  Tobacco Use  . Smoking status: Former Smoker    Last attempt to quit: 06/18/2009    Years since quitting: 8.7  . Smokeless tobacco: Never Used  Substance and Sexual Activity  . Alcohol use: Yes    Comment: once every 2 months  . Drug use: No  . Sexual activity: Yes  Lifestyle  . Physical activity:    Days per week: Not on file    Minutes per session: Not on file  . Stress: Not on file  Relationships  . Social connections:    Talks on phone: Not on file    Gets together: Not on file    Attends religious service: Not on file    Active member of club or organization: Not on file    Attends meetings of clubs or organizations: Not on file    Relationship status: Not on file  . Intimate partner violence:    Fear of current or ex partner: Not on file    Emotionally abused: Not on file    Physically abused: Not on file    Forced sexual activity: Not on file  Other Topics Concern  . Not on file  Social History Narrative  . Not on file     Review of Systems: General: negative for chills, fever, night sweats or weight changes.  Cardiovascular: negative for chest pain, dyspnea on exertion, edema, orthopnea, palpitations, paroxysmal nocturnal dyspnea or shortness of breath Dermatological: negative for rash Respiratory: negative for cough or wheezing Urologic: negative for hematuria Abdominal:  negative for nausea, vomiting, diarrhea, bright red blood per rectum, melena, or hematemesis Neurologic: negative for visual changes, syncope, or dizziness All other systems reviewed and are otherwise negative except as noted above.   Physical Exam:  Blood pressure 120/72, pulse 80, height 5\' 4"  (1.626 m), weight 247 lb (112 kg), SpO2 97 %.  General appearance: alert, cooperative, no distress and morbidly obese Neck: no carotid bruit and no JVD Lungs: clear to auscultation bilaterally Heart: regular rate and rhythm, S1, S2 normal, no murmur, click, rub or gallop Extremities: extremities normal, atraumatic, no cyanosis or edema Pulses: 2+ and symmetric Skin: Skin color, texture, turgor normal. No rashes or lesions Neurologic: Grossly normal  EKG Chronic LBBB, 77 bpm -- personally reviewed   ASSESSMENT AND PLAN:   Chronic Combined Systolic and Diastolic HF: recent echo in August showed EF of 40-45%, c/w prior studies in the past. G2DD also noted. LHC in 2017 showed normal coronaries. She is stable w/o dyspnea. Euvolemic on exam. BP and HR stable. Continue Entresto, Coreg and spironolactone.  Aortic Stenosis: mild by recent echo 09/2017. Mean gradient 13 mmHg.   HTN: controlled on current regimen. BP 120/72 today.   OSA: reports compliance w/ CPAP. Followed by Dr. Radford Pax. She recently got a new mask and reports that she has been receiving notifications of a air leak.  She also needs a compliance report sent to [place]. Will route to sleep clinic team to look into this.  LBBB: chronic   Obesity: We discussed continuation of dietary changes and physical activity for weight loss.  Her knee arthritis however may limit physical activity.  I recommended water aerobics.  Knee osteoarthritis: Patient is planning on conservative management for now.  She has been getting cortisone injections with subjective improvement.  She may ultimately need knee replacement surgery further  down the road but  she is not interested in pursuing this at this time. Ok to continue Tylenol arthritis. I recommend avoidance of regular use of NSAIDs given her systolic HF and risk of fluid retention.   HLD: Patient is statin intolerant.  She is on Zetia.  Her PCP follows labs.  Most recent lipid panel December 2019 showed elevated LDL 128 mg/dL.  Goal LDL is less than 100.  Since December she has made some dietary changes.  Hopefully her LDL will subsequently improve.  PCP to continue monitoring lipids.   Follow-Up w/ Dr. Radford Pax in 6 months.   [name], MHS [facility] 03/21/2018 10:51 AM

## 2018-03-21 NOTE — Patient Instructions (Signed)
Medication Instructions:  none If you need a refill on your cardiac medications before your next appointment, please call your pharmacy.   Lab work: none If you have labs (blood work) drawn today and your tests are completely normal, you will receive your results only by: Marland Kitchen MyChart Message (if you have MyChart) OR . A paper copy in the mail If you have any lab test that is abnormal or we need to change your treatment, we will call you to review the results.  Testing/Procedures: none  Follow-Up: At [facility], you and your health needs are our priority.  As part of our continuing mission to provide you with exceptional heart care, we have created designated Provider Care Teams.  These Care Teams include your primary Cardiologist (physician) and Advanced Practice Providers (APPs -  Physician Assistants and Nurse Practitioners) who all work together to provide you with the care you need, when you need it. You will need a follow up appointment in 6 months.  Please call our office 2 months in advance to schedule this appointment.  You may see Dr Radford Pax or one of the following Advanced Practice Providers on your designated Care Team:   Lyda Jester, PA-C Melina Copa, PA-C . Ermalinda Barrios, PA-C  Any Other Special Instructions Will Be Listed Below (If Applicable).

## 2018-03-22 ENCOUNTER — Telehealth: Payer: Self-pay | Admitting: *Deleted

## 2018-03-22 NOTE — Telephone Encounter (Signed)
Reached out to the patient to hear her concerns about an air leak alarm on her cpap and she states she is concerned the leak will cause her to be noncompliant and her employer the [employer] will suspend her from working. I reached out to [facility] to fax over today's office note and was informed that [place]  her insurance carrier will follow her compliance not [facility] and as long as she is 70% or above she is in compliance.[facility] states she does not need to miss anymore days or she will drop down to noncompliance status. Called patient lmtcb.

## 2018-03-22 NOTE — Telephone Encounter (Signed)
-----   Message from Frederik Schmidt, RN sent at 03/21/2018 11:15 AM EST ----- Regarding: Sleep Compliance Report The patient was in for OV with Brittainy on 2/5 and requested a Sleep Compliance Report sent to [place].  Also, she needs to be contacted because she is experiencing an Air Leak alarm on her equipment.  Thank you for your help.

## 2018-03-30 ENCOUNTER — Ambulatory Visit: Payer: Managed Care, Other (non HMO) | Admitting: Family Medicine

## 2018-04-05 ENCOUNTER — Encounter: Payer: Self-pay | Admitting: Family Medicine

## 2018-04-05 ENCOUNTER — Ambulatory Visit: Payer: Managed Care, Other (non HMO) | Admitting: Family Medicine

## 2018-04-05 VITALS — BP 110/70 | HR 70 | Temp 98.4°F | Resp 18 | Ht 64.5 in | Wt 245.0 lb

## 2018-04-05 DIAGNOSIS — E782 Mixed hyperlipidemia: Secondary | ICD-10-CM | POA: Diagnosis not present

## 2018-04-05 DIAGNOSIS — I428 Other cardiomyopathies: Secondary | ICD-10-CM | POA: Diagnosis not present

## 2018-04-05 DIAGNOSIS — E119 Type 2 diabetes mellitus without complications: Secondary | ICD-10-CM | POA: Diagnosis not present

## 2018-04-05 NOTE — Progress Notes (Signed)
Subjective:    Patient ID: [name], female    DOB: 16-Aug-1955, 63 y.o.   MRN: [id number]  HPI Patient has a history of nonischemic cardiomyopathy as well as type 2 diabetes mellitus.  She is here today for a follow-up of her chronic medical conditions.  Last year in August, the patient weighed 261 pounds.  Through diet and exercise she is lost down to 245 pounds.  Unfortunately she is developed severe pain in her left knee stemming from that.  She is seeing an orthopedist who is performing cortisone injections in her knee and is suggested that she may need a knee replacement.  This summer, her hemoglobin A1c was 6.8.  With exercise and weight loss in December it is dropped to 6.2.  She is here today to recheck that.  She denies any polyuria, polydipsia, or blurry vision.  She denies any chest pain shortness of breath or dyspnea on exertion.  She is complaining of severe pain in her left knee.  She is also reporting worsening hearing loss.  On examination today, there is a tip to a hearing aid in her right auditory canal and there are 2 tips in her left auditory canal.  This was removed with a pair hemostats.  Her hearing improved after we remove the foreign bodies. Past Medical History:  Diagnosis Date  . Anxiety   . Aortic stenosis, mild    echo 2017  . Breast cancer ([place])    left breast  . Carpal tunnel syndrome   . Cataract   . Chronic diastolic CHF (congestive heart failure) (HCC)    NYHA class II  . Hyperlipidemia   . Hypertension   . Left bundle branch block (LBBB)   . Low back pain   . Neuropathy   . NICM (nonischemic cardiomyopathy) ([place])    EF 30%, cath 05/20/2015 clean coronary.  EF resolved by echo with EF 55%  . OSA on CPAP   . Prediabetes   . Pulmonary HTN ([place]) 05/2015   Severe with PASP 67/40mmHg - resolved on followup echo  . PVC's (premature ventricular contractions) 11/04/2015   Past Surgical History:  Procedure Laterality Date  . CARDIAC CATHETERIZATION N/A  05/20/2015   Procedure: Right/Left Heart Cath and Coronary Angiography;  Surgeon: Troy Sine, MD;  Location: [facility];  Service: Cardiovascular;  Laterality: N/A;  . CATARACT EXTRACTION W/ INTRAOCULAR LENS  IMPLANT, BILATERAL    . DILATION AND CURETTAGE OF UTERUS    . lumpectomy left breast    . UTERINE FIBROID SURGERY     Current Outpatient Medications on File Prior to Visit  Medication Sig Dispense Refill  . acetaminophen (TYLENOL 8 HOUR ARTHRITIS PAIN) 650 MG CR tablet Take 1,300 mg by mouth as needed for pain.    Marland Kitchen aspirin 81 MG tablet Take 81 mg by mouth daily.    . carvedilol (COREG) 6.25 MG tablet Take 1 tablet (6.25 mg total) by mouth 2 (two) times daily with a meal. 180 tablet 3  . cholecalciferol (VITAMIN D) 1000 units tablet Take 1,000 Units by mouth daily.    Marland Kitchen ezetimibe (ZETIA) 10 MG tablet TAKE 1 TABLET BY MOUTH ONCE DAILY 30 tablet 7  . furosemide (LASIX) 40 MG tablet TAKE ONE TO TWO TABLETS BY MOUTH DAILY AS DIRECTED 180 tablet 3  . gabapentin (NEURONTIN) 100 MG capsule Take 1 capsule (100 mg total) by mouth 2 (two) times daily. 60 capsule 0  . mometasone (NASONEX) 50 MCG/ACT  nasal spray Place 2 sprays into the nose daily. 17 g 12  . Multiple Vitamin (MULTIVITAMIN) tablet Take 1 tablet by mouth daily.    . sacubitril-valsartan (ENTRESTO) 24-26 MG Take 1 tablet by mouth 2 (two) times daily. 180 tablet 3  . spironolactone (ALDACTONE) 25 MG tablet Take 0.5 tablets (12.5 mg total) by mouth daily. 90 tablet 3  . vitamin C (ASCORBIC ACID) 500 MG tablet Take 500 mg by mouth as needed.      No current facility-administered medications on file prior to visit.    Allergies  Allergen Reactions  . Atorvastatin Other (See Comments)    REACTION: mylagias  . Pravastatin Sodium Other (See Comments)    REACTION: myalgias  . Sulfa Antibiotics Hives   Social History   Socioeconomic History  . Marital status: Significant Other    Spouse name: Not on file  . Number of  children: Not on file  . Years of education: Not on file  . Highest education level: Not on file  Occupational History  . 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 Financial resource strain: Not on file  . Food insecurity:    Worry: Not on file    Inability: Not on file  . Transportation needs:    Medical: Not on file    Non-medical: Not on file  Tobacco Use  . Smoking status: Former Smoker    Last attempt to quit: 06/18/2009    Years since quitting: 8.8  . Smokeless tobacco: Never Used  Substance and Sexual Activity  . Alcohol use: Yes    Comment: once every 2 months  . Drug use: No  . Sexual activity: Yes  Lifestyle  . Physical activity:    Days per week: Not on file    Minutes per session: Not on file  . Stress: Not on file  Relationships  . Social connections:    Talks on phone: Not on file    Gets together: Not on file    Attends religious service: Not on file    Active member of club or organization: Not on file    Attends meetings of clubs or organizations: Not on file    Relationship status: Not on file  . Intimate partner violence:    Fear of current or ex partner: Not on file    Emotionally abused: Not on file    Physically abused: Not on file    Forced sexual activity: Not on file  Other Topics Concern  . Not on file  Social History Narrative  . Not on file      Review of Systems  All other systems reviewed and are negative.      Objective:   Physical Exam  Constitutional: She appears well-developed.  HENT:  Right Ear: Tympanic membrane and external ear normal. A foreign body is present. Decreased hearing is noted.  Left Ear: Tympanic membrane and external ear normal. A foreign body is present. Decreased hearing is noted.  Nose: No mucosal edema or rhinorrhea. Right sinus exhibits no maxillary sinus tenderness and no frontal sinus tenderness. Left sinus exhibits no maxillary sinus tenderness and no frontal sinus tenderness.  Mouth/Throat:  Oropharynx is clear and moist. No oropharyngeal exudate, posterior oropharyngeal edema or posterior oropharyngeal erythema.  Neck: No JVD present.  Cardiovascular: Normal rate, regular rhythm and normal heart sounds.  No murmur heard. Pulmonary/Chest: Effort normal and breath sounds normal. No respiratory distress. She has no wheezes. She has no rales. She exhibits no  tenderness.  Lymphadenopathy:    She has no cervical adenopathy.  Vitals reviewed.         Assessment & Plan:  .Type 2 diabetes mellitus without complication, without long-term current use of insulin (HCC) - Plan: Hemoglobin A1c, CBC with Differential/Platelet, COMPLETE METABOLIC PANEL WITH GFR, Lipid panel  Mixed hyperlipidemia  Nonischemic cardiomyopathy (HCC) Blood pressure today is excellent.  I am very proud of the patient for losing 16 pounds.  I encouraged the patient to continue regular daily aerobic exercise however she will have to find something that she can do without irritating her knee.  Perhaps she can use a stationary bike or rowing machine.  I will check a CBC, CMP, fasting lipid panel, and a hemoglobin A1c.  If her hemoglobin A1c is greater than 6.5, given her history of congestive heart failure, I would recommend starting either farxiga 10 mg a day Jardiance 25 mg a day.  Goal LDL cholesterol is less than 100.  Blood pressure is at goal.  I removed 3 foreign bodies.  2 from her left auditory canal and 1 from her right auditory canal.

## 2018-04-06 LAB — COMPLETE METABOLIC PANEL WITH GFR
AG Ratio: 1.7 (calc) (ref 1.0–2.5)
ALT: 15 U/L (ref 6–29)
AST: 11 U/L (ref 10–35)
Albumin: 4.2 g/dL (ref 3.6–5.1)
Alkaline phosphatase (APISO): 57 U/L (ref 37–153)
BILIRUBIN TOTAL: 0.3 mg/dL (ref 0.2–1.2)
BUN: 17 mg/dL (ref 7–25)
CO2: 26 mmol/L (ref 20–32)
Calcium: 9.8 mg/dL (ref 8.6–10.4)
Chloride: 104 mmol/L (ref 98–110)
Creat: 0.83 mg/dL (ref 0.50–0.99)
GFR, EST NON AFRICAN AMERICAN: 75 mL/min/{1.73_m2} (ref >=60)
GFR, Est African American: 87 mL/min/{1.73_m2} (ref >=60)
GLUCOSE: 99 mg/dL (ref 65–99)
Globulin: 2.5 g/dL (calc) (ref 1.9–3.7)
Potassium: 4.3 mmol/L (ref 3.5–5.3)
Sodium: 140 mmol/L (ref 135–146)
Total Protein: 6.7 g/dL (ref 6.1–8.1)

## 2018-04-06 LAB — CBC WITH DIFFERENTIAL/PLATELET
Absolute Monocytes: 651 cells/uL (ref 200–950)
Basophils Absolute: 44 cells/uL (ref 0–200)
Basophils Relative: 0.5 %
Eosinophils Absolute: 132 cells/uL (ref 15–500)
Eosinophils Relative: 1.5 %
HCT: 40.7 % (ref 35.0–45.0)
Hemoglobin: 13.9 g/dL (ref 11.7–15.5)
Lymphs Abs: 2834 cells/uL (ref 850–3900)
MCH: 31 pg (ref 27.0–33.0)
MCHC: 34.2 g/dL (ref 32.0–36.0)
MCV: 90.8 fL (ref 80.0–100.0)
MPV: 9.9 fL (ref 7.5–12.5)
Monocytes Relative: 7.4 %
Neutro Abs: 5139 cells/uL (ref 1500–7800)
Neutrophils Relative %: 58.4 %
Platelets: 420 10*3/uL — ABNORMAL HIGH (ref 140–400)
RBC: 4.48 10*6/uL (ref 3.80–5.10)
RDW: 13.1 % (ref 11.0–15.0)
Total Lymphocyte: 32.2 %
WBC: 8.8 10*3/uL (ref 3.8–10.8)

## 2018-04-06 LAB — HEMOGLOBIN A1C
Hgb A1c MFr Bld: 6.4 % of total Hgb — ABNORMAL HIGH (ref <5.7)
Mean Plasma Glucose: 137 (calc)
eAG (mmol/L): 7.6 (calc)

## 2018-04-06 LAB — LIPID PANEL
Cholesterol: 234 mg/dL — ABNORMAL HIGH (ref <200)
HDL: 72 mg/dL (ref >=50)
LDL Cholesterol (Calc): 145 mg/dL (calc) — ABNORMAL HIGH
Non-HDL Cholesterol (Calc): 162 mg/dL (calc) — ABNORMAL HIGH (ref <130)
TRIGLYCERIDES: 73 mg/dL (ref <150)
Total CHOL/HDL Ratio: 3.3 (calc) (ref <5.0)

## 2018-04-09 MED ORDER — ROSUVASTATIN CALCIUM 5 MG PO TABS: 5.0000 mg | ORAL_TABLET | Freq: Every day | ORAL | 3 refills | Status: DC

## 2018-05-03 ENCOUNTER — Telehealth: Payer: Self-pay | Admitting: Cardiology

## 2018-05-03 NOTE — Telephone Encounter (Signed)
Spoke with the patient, she expressed understanding. She had no further questions.

## 2018-05-03 NOTE — Telephone Encounter (Signed)
Needs to talk with her PCP

## 2018-05-03 NOTE — Telephone Encounter (Signed)
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 She would like a letter for work to be excused from work, because of her condition.

## 2018-05-11 ENCOUNTER — Telehealth: Payer: Self-pay | Admitting: Family Medicine

## 2018-05-11 ENCOUNTER — Ambulatory Visit (INDEPENDENT_AMBULATORY_CARE_PROVIDER_SITE_OTHER): Payer: Managed Care, Other (non HMO) | Admitting: Family Medicine

## 2018-05-11 ENCOUNTER — Encounter: Payer: Self-pay | Admitting: Family Medicine

## 2018-05-11 VITALS — BP 146/82 | HR 76 | Temp 97.6°F | Resp 18 | Ht 64.5 in | Wt 248.0 lb

## 2018-05-11 DIAGNOSIS — F430 Acute stress reaction: Secondary | ICD-10-CM

## 2018-05-11 NOTE — Telephone Encounter (Signed)
I referred patient to a psychiatrist today and was told that psychiatry has to see a patient over the course of 3 months before they can write a patient out for Short Term Disability. Spoke with patient and informed her that she could still  see Psychiatry but it will not be an immediate response as far as Short Term Disability goes. She states that she contacted her employer and they stated that due to the pandemic you could write that she has underlying conditions and is more at risk for covid and give them a date on when it is safe to return to work. She states the intervals would be 4-8 weeks. Patient would like to know if you would be willing to write her out of work by filling out the form and her employer will review on case by case to determine if they will pay her to be out of work. Please advise?

## 2018-05-11 NOTE — Progress Notes (Signed)
Subjective:    Patient ID: [name], female    DOB: 08-12-1955, 63 y.o.   MRN: [id number]  HPI Patient is here today requesting that I write her out of work for 4 to 8 weeks on short-term disability.  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  Due to the recent coronavirus outbreak, she is extremely anxious and afraid to work.  She states that she has a panic attack every day that she is driving.  She is concerned because she is a high risk patient.  She does have a history of nonischemic cardiomyopathy with an ejection fraction of 45%.  She also has type 2 diabetes mellitus.  That coupled with her age would make her a high risk patient for she to contract the virus.  I agree that it would be best for the patient not to work due to her medical conditions however I cannot say that her disability keeps her from working from a medical standpoint.  The patient states that she is not requesting disability from a medical standpoint but rather from a psychiatric standpoint due to her anxiety and fear over contracting the virus. Past Medical History:  Diagnosis Date  . Anxiety   . Aortic stenosis, mild    echo 2017  . Breast cancer ([place])    left breast  . Carpal tunnel syndrome   . Cataract   . Chronic diastolic CHF (congestive heart failure) ([facility])    NYHA class II  . Hyperlipidemia   . Hypertension   . Left bundle branch block (LBBB)   . Low back pain   . Neuropathy   . NICM (nonischemic cardiomyopathy) ([place])    EF 30%, cath 05/20/2015 clean coronary.  EF resolved by echo with EF 55%  . OSA on CPAP   . Prediabetes   . Pulmonary HTN ([place]) 05/2015   Severe with PASP 67/5mmHg - resolved on followup echo  . PVC's (premature ventricular contractions) 11/04/2015   Past Surgical History:  Procedure Laterality Date  . CARDIAC CATHETERIZATION N/A 05/20/2015   Procedure: Right/Left Heart Cath and Coronary Angiography;  Surgeon: Troy Sine, MD;  Location: [facility];  Service:  Cardiovascular;  Laterality: N/A;  . CATARACT EXTRACTION W/ INTRAOCULAR LENS  IMPLANT, BILATERAL    . DILATION AND CURETTAGE OF UTERUS    . lumpectomy left breast    . UTERINE FIBROID SURGERY     Current Outpatient Medications on File Prior to Visit  Medication Sig Dispense Refill  . acetaminophen (TYLENOL 8 HOUR ARTHRITIS PAIN) 650 MG CR tablet Take 1,300 mg by mouth as needed for pain.    Marland Kitchen aspirin 81 MG tablet Take 81 mg by mouth daily.    . carvedilol (COREG) 6.25 MG tablet Take 1 tablet (6.25 mg total) by mouth 2 (two) times daily with a meal. 180 tablet 3  . cholecalciferol (VITAMIN D) 1000 units tablet Take 1,000 Units by mouth daily.    Marland Kitchen ezetimibe (ZETIA) 10 MG tablet TAKE 1 TABLET BY MOUTH ONCE DAILY 30 tablet 7  . furosemide (LASIX) 40 MG tablet TAKE ONE TO TWO TABLETS BY MOUTH DAILY AS DIRECTED 180 tablet 3  . gabapentin (NEURONTIN) 100 MG capsule Take 1 capsule (100 mg total) by mouth 2 (two) times daily. 60 capsule 0  . mometasone (NASONEX) 50 MCG/ACT nasal spray Place 2 sprays into the nose daily. 17 g 12  . Multiple Vitamin (MULTIVITAMIN) tablet Take 1 tablet by mouth daily.    Marland Kitchen  rosuvastatin (CRESTOR) 5 MG tablet Take 1 tablet (5 mg total) by mouth daily. 90 tablet 3  . sacubitril-valsartan (ENTRESTO) 24-26 MG Take 1 tablet by mouth 2 (two) times daily. 180 tablet 3  . spironolactone (ALDACTONE) 25 MG tablet Take 0.5 tablets (12.5 mg total) by mouth daily. 90 tablet 3  . vitamin C (ASCORBIC ACID) 500 MG tablet Take 500 mg by mouth as needed.      No current facility-administered medications on file prior to visit.    Allergies  Allergen Reactions  . Atorvastatin Other (See Comments)    REACTION: mylagias  . Pravastatin Sodium Other (See Comments)    REACTION: myalgias  . Sulfa Antibiotics Hives   Social History   Socioeconomic History  . Marital status: Significant Other    Spouse name: Not on file  . Number of children: Not on file  . Years of education: Not on  file  . Highest education level: Not on file  Occupational History  . 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 Financial resource strain: Not on file  . Food insecurity:    Worry: Not on file    Inability: Not on file  . Transportation needs:    Medical: Not on file    Non-medical: Not on file  Tobacco Use  . Smoking status: Former Smoker    Last attempt to quit: 06/18/2009    Years since quitting: 8.9  . Smokeless tobacco: Never Used  Substance and Sexual Activity  . Alcohol use: Yes    Comment: once every 2 months  . Drug use: No  . Sexual activity: Yes  Lifestyle  . Physical activity:    Days per week: Not on file    Minutes per session: Not on file  . Stress: Not on file  Relationships  . Social connections:    Talks on phone: Not on file    Gets together: Not on file    Attends religious service: Not on file    Active member of club or organization: Not on file    Attends meetings of clubs or organizations: Not on file    Relationship status: Not on file  . Intimate partner violence:    Fear of current or ex partner: Not on file    Emotionally abused: Not on file    Physically abused: Not on file    Forced sexual activity: Not on file  Other Topics Concern  . Not on file  Social History Narrative  . Not on file      Review of Systems  All other systems reviewed and are negative.      Objective:   Physical Exam  No physical exam was performed today as the entire office visit was spent discussing the situation with the patient      Assessment & Plan:  Situational stress Spent more than 15 minutes today with the patient in discussion.  I explained to the patient that I can only write her out for short-term disability for medical conditions which I am treating her.  I do not believe her nonischemic cardiomyopathy or her diabetes creates a medical condition requiring short-term disability.  She has been working with these conditions prior to this event and  therefore these conditions are not keeping her from working.  I do agree with the patient that she is a high risk patient were she to contract the virus.  It would be my medical advice for her to practice social distancing and  stay at home until the outbreak subsides.  However the patient states she cannot do that without getting paid and therefore she is wanting short-term disability.  Therefore I recommended that if the reason she is unable to work is because of her anxiety, she must see a psychiatrist in order to be determined if she is in fact disabled.  I will schedule her to see a psychiatrist for this evaluation.

## 2018-05-15 ENCOUNTER — Encounter: Payer: Self-pay | Admitting: Family Medicine

## 2018-05-15 NOTE — Telephone Encounter (Signed)
I have received no paperwork from her company.  I did write a letter (in her chart under jury duty) that needs to be printed and signed that she can take to her employer.

## 2018-05-15 NOTE — Telephone Encounter (Signed)
Left message return call

## 2018-05-24 NOTE — Telephone Encounter (Signed)
Called and notified patient of letter. Patient verbalized understanding. Letter was printed and given to the front desk

## 2018-05-29 ENCOUNTER — Encounter (INDEPENDENT_AMBULATORY_CARE_PROVIDER_SITE_OTHER): Payer: Self-pay | Admitting: Specialist

## 2018-06-04 ENCOUNTER — Encounter (INDEPENDENT_AMBULATORY_CARE_PROVIDER_SITE_OTHER): Payer: Self-pay | Admitting: Specialist

## 2018-06-04 ENCOUNTER — Other Ambulatory Visit: Payer: Self-pay

## 2018-06-04 ENCOUNTER — Ambulatory Visit (INDEPENDENT_AMBULATORY_CARE_PROVIDER_SITE_OTHER): Payer: Managed Care, Other (non HMO) | Admitting: Specialist

## 2018-06-04 VITALS — BP 119/77 | HR 73 | Ht 64.5 in | Wt 248.0 lb

## 2018-06-04 DIAGNOSIS — M1712 Unilateral primary osteoarthritis, left knee: Secondary | ICD-10-CM

## 2018-06-04 MED ORDER — METHYLPREDNISOLONE ACETATE 40 MG/ML IJ SUSP
40.0000 mg | INTRAMUSCULAR | Status: AC | PRN
  Administered 2018-06-04: 10:00:00 40 mg via INTRA_ARTICULAR

## 2018-06-04 MED ORDER — BUPIVACAINE HCL 0.25 % IJ SOLN
4.0000 mL | INTRAMUSCULAR | Status: AC | PRN
  Administered 2018-06-04: 4 mL via INTRA_ARTICULAR

## 2018-06-04 NOTE — Progress Notes (Addendum)
Office Visit Note   Patient: [name]           Date of Birth: 1955-12-01           MRN: [id number] Visit Date: 06/04/2018              Requested by: Susy Frizzle, MD [address] [place], [place] [address] PCP: Susy Frizzle, MD   Assessment & Plan: Visit Diagnoses:  1. Unilateral primary osteoarthritis, left knee     Plan:  Knee is suffering from osteoarthritis, only real proven treatments are Weight loss, Do not take NSIADs like diclofenac, ibuprofen or naprosyn and exercise. You may take arthritis strength tylenol. Well padded shoes help. Low impact aerobic exercise is recommended.  Ice the knee 2-3 times a day 15-20 mins at a time.   Follow-Up Instructions: Return in about 3 months (around 09/03/2018).   Orders:  Orders Placed This Encounter  Procedures  . Large Joint Inj: L knee   Meds ordered this encounter  Medications  . bupivacaine (MARCAINE) 0.25 % (with pres) injection 4 mL  . methylPREDNISolone acetate (DEPO-MEDROL) injection 40 mg      Procedures: Large Joint Inj: L knee on 06/04/2018 10:04 AM Indications: pain Details: 25 G 1.5 in needle, anterolateral approach  Arthrogram: No  Medications: 40 mg methylPREDNISolone acetate 40 MG/ML; 4 mL bupivacaine 0.25 % Outcome: tolerated well, no immediate complications  Bandaid applied. Procedure, treatment alternatives, risks and benefits explained, specific risks discussed. Consent was given by the patient. Immediately prior to procedure a time out was called to verify the correct patient, procedure, equipment, support staff and site/side marked as required. Patient was prepped and draped in the usual sterile fashion.       Clinical Data: No additional findings.   Subjective: Chief Complaint  Patient presents with  . Left Knee - Follow-up    Left knee pain now for nearly 4-5 months, she was at work, drives for [employer] and was on a trip to [facility], she had difficulty  bending the left knee and there is pain with flexion and extension. No popping or clicking, pain with flexing the left knee. Straightening the left knee, sitting is okay but has pain with weight bearing on the left leg. There is swelling due to CHF and she is on a fluid pill. Left leg swells intermittently.Had injection of the left   Review of Systems  Constitutional: Negative.   HENT: Negative.   Eyes: Negative.   Respiratory: Negative.   Cardiovascular: Negative.   Gastrointestinal: Negative.   Endocrine: Negative.   Genitourinary: Negative.   Musculoskeletal: Negative.   Skin: Negative.   Allergic/Immunologic: Negative.   Neurological: Negative.   Hematological: Negative.   Psychiatric/Behavioral: Negative.      Objective: Vital Signs: BP 119/77 (BP Location: Right Arm, Patient Position: Sitting)   Pulse 73   Ht 5' 4.5" (1.638 m)   Wt 248 lb (112.5 kg)   BMI 41.91 kg/m   Physical Exam Constitutional:      Appearance: She is well-developed.  HENT:     Head: Normocephalic and atraumatic.  Eyes:     Pupils: Pupils are equal, round, and reactive to light.  Neck:     Musculoskeletal: Normal range of motion and neck supple.  Pulmonary:     Effort: Pulmonary effort is normal.     Breath sounds: Normal breath sounds.  Abdominal:     General: Bowel sounds are normal.  Palpations: Abdomen is soft.  Musculoskeletal: Normal range of motion.  Skin:    General: Skin is warm and dry.  Neurological:     Mental Status: She is alert and oriented to person, place, and time.  Psychiatric:        Behavior: Behavior normal.        Thought Content: Thought content normal.        Judgment: Judgment normal.     Left Knee Exam   Muscle Strength  The patient has normal left knee strength.      Specialty Comments:  No specialty comments available.  Imaging: No results found.   PMFS History: Patient Active Problem List   Diagnosis Date Noted  . Influenza A  11/07/2016  . Prediabetes 11/07/2016  . PVC's (premature ventricular contractions) 11/04/2015  . Aortic stenosis, mild   . Heart palpitations 07/28/2015  . Morbid obesity ([place]) 05/23/2015  . Hypertensive heart disease   . Nonischemic cardiomyopathy ([place])   . NSVT (nonsustained ventricular tachycardia) ([place]) 05/21/2015  . Chronic diastolic heart failure ([place]) 05/20/2015  . OSA (obstructive sleep apnea) 01/02/2014  . LBBB (left bundle branch block) 01/02/2014  . Mixed hyperlipidemia 04/15/2010  . Personal history of malignant neoplasm of breast 04/15/2010   Past Medical History:  Diagnosis Date  . Anxiety   . Aortic stenosis, mild    echo 2017  . Breast cancer ([place])    left breast  . Carpal tunnel syndrome   . Cataract   . Chronic diastolic CHF (congestive heart failure) ([facility])    NYHA class II  . Hyperlipidemia   . Hypertension   . Left bundle branch block (LBBB)   . Low back pain   . Neuropathy   . NICM (nonischemic cardiomyopathy) ([place])    EF 30%, cath 05/20/2015 clean coronary.  EF resolved by echo with EF 55%  . OSA on CPAP   . Prediabetes   . Pulmonary HTN ([place]) 05/2015   Severe with PASP 67/71mmHg - resolved on followup echo  . PVC's (premature ventricular contractions) 11/04/2015    Family History  Problem Relation Age of Onset  . Arthritis Mother   . Depression Mother   . Hearing loss Mother   . Hyperlipidemia Mother   . Miscarriages / [place] Mother   . Colon cancer Mother 9       2010  . Alcohol abuse Father   . Cancer Father        PROSTATE  . Depression Father   . Hypertension Father   . Hyperlipidemia Father   . Drug abuse Brother   . Early death Maternal Grandmother   . Cancer Paternal Grandmother   . Heart attack Neg Hx     Past Surgical History:  Procedure Laterality Date  . CARDIAC CATHETERIZATION N/A 05/20/2015   Procedure: Right/Left Heart Cath and Coronary Angiography;  Surgeon: Troy Sine, MD;  Location: [facility];   Service: Cardiovascular;  Laterality: N/A;  . CATARACT EXTRACTION W/ INTRAOCULAR LENS  IMPLANT, BILATERAL    . DILATION AND CURETTAGE OF UTERUS    . lumpectomy left breast    . UTERINE FIBROID SURGERY     Social History   Occupational History  . Occupation: Truck Geophysicist/field seismologist  Tobacco Use  . Smoking status: Former Smoker    Last attempt to quit: 06/18/2009    Years since quitting: 8.9  . Smokeless tobacco: Never Used  Substance and Sexual Activity  . Alcohol use: Yes  Comment: once every 2 months  . Drug use: No  . Sexual activity: Yes

## 2018-06-04 NOTE — Patient Instructions (Signed)
Plan:  Knee is suffering from osteoarthritis, only real proven treatments are Weight loss, Do not take NSIADs like diclofenac, ibuprofen or naprosyn and exercise. You may take arthritis strength tylenol. Well padded shoes help. Low impact aerobic exercise is recommended.  Ice the knee 2-3 times a day 15-20 mins at a time.

## 2018-09-12 ENCOUNTER — Telehealth: Payer: Self-pay | Admitting: *Deleted

## 2018-09-12 NOTE — Telephone Encounter (Signed)
Pt answered NO to the following questions:      COVID-19 Pre-Screening Questions:  . In the past 7 to 10 days have you had a cough,  shortness of breath, headache, congestion, fever (100 or greater) body aches, chills, sore throat, or sudden loss of taste or sense of smell? . Have you been around anyone with known Covid 19. . Have you been around anyone who is awaiting Covid 19 test results in the past 7 to 10 days? . Have you been around anyone who has been exposed to Covid 19, or has mentioned symptoms of Covid 19 within the past 7 to 10 days?  If you have any concerns/questions about symptoms patients report during screening (either on the phone or at threshold). Contact the provider seeing the patient or DOD for further guidance.  If neither are available contact a member of the leadership team.

## 2018-09-13 ENCOUNTER — Other Ambulatory Visit: Payer: Self-pay

## 2018-09-13 ENCOUNTER — Ambulatory Visit: Payer: Managed Care, Other (non HMO) | Admitting: Cardiology

## 2018-09-13 ENCOUNTER — Encounter: Payer: Self-pay | Admitting: Cardiology

## 2018-09-13 VITALS — BP 120/78 | HR 70 | Ht 64.5 in | Wt 260.0 lb

## 2018-09-13 DIAGNOSIS — Z79899 Other long term (current) drug therapy: Secondary | ICD-10-CM

## 2018-09-13 DIAGNOSIS — I350 Nonrheumatic aortic (valve) stenosis: Secondary | ICD-10-CM

## 2018-09-13 DIAGNOSIS — I493 Ventricular premature depolarization: Secondary | ICD-10-CM

## 2018-09-13 DIAGNOSIS — I447 Left bundle-branch block, unspecified: Secondary | ICD-10-CM | POA: Diagnosis not present

## 2018-09-13 DIAGNOSIS — I5022 Chronic systolic (congestive) heart failure: Secondary | ICD-10-CM

## 2018-09-13 DIAGNOSIS — E785 Hyperlipidemia, unspecified: Secondary | ICD-10-CM

## 2018-09-13 LAB — BASIC METABOLIC PANEL
BUN/Creatinine Ratio: 20 (ref 12–28)
BUN: 13 mg/dL (ref 8–27)
CO2: 23 mmol/L (ref 20–29)
Calcium: 9.7 mg/dL (ref 8.7–10.3)
Chloride: 102 mmol/L (ref 96–106)
Creatinine, Ser: 0.66 mg/dL (ref 0.57–1.00)
GFR calc Af Amer: 109 mL/min/{1.73_m2} (ref >59)
GFR calc non Af Amer: 94 mL/min/{1.73_m2} (ref >59)
Glucose: 128 mg/dL — ABNORMAL HIGH (ref 65–99)
Potassium: 4.3 mmol/L (ref 3.5–5.2)
Sodium: 139 mmol/L (ref 134–144)

## 2018-09-13 LAB — HEPATIC FUNCTION PANEL
ALT: 29 IU/L (ref 0–32)
AST: 22 IU/L (ref 0–40)
Albumin: 4.4 g/dL (ref 3.8–4.8)
Alkaline Phosphatase: 63 IU/L (ref 39–117)
Bilirubin Total: 0.3 mg/dL (ref 0.0–1.2)
Bilirubin, Direct: 0.11 mg/dL (ref 0.00–0.40)
Total Protein: 6.6 g/dL (ref 6.0–8.5)

## 2018-09-13 LAB — LIPID PANEL
Chol/HDL Ratio: 2.7 ratio (ref 0.0–4.4)
Cholesterol, Total: 150 mg/dL (ref 100–199)
HDL: 56 mg/dL (ref >39)
LDL Calculated: 74 mg/dL (ref 0–99)
Triglycerides: 102 mg/dL (ref 0–149)
VLDL Cholesterol Cal: 20 mg/dL (ref 5–40)

## 2018-09-13 NOTE — Patient Instructions (Signed)
Medication Instructions:  none If you need a refill on your cardiac medications before your next appointment, please call your pharmacy.   Lab work:TODAY FLP HFT BMP If you have labs (blood work) drawn today and your tests are completely normal, you will receive your results only by: Marland Kitchen MyChart Message (if you have MyChart) OR . A paper copy in the mail If you have any lab test that is abnormal or we need to change your treatment, we will call you to review the results.  Testing/Procedures:PLEASE SCHEDULE Your physician has requested that you have an echocardiogram. Echocardiography is a painless test that uses sound waves to create images of your heart. It provides your doctor with information about the size and shape of your heart and how well your heart's chambers and valves are working. This procedure takes approximately one hour. There are no restrictions for this procedure.    Follow-Up: At [facility], you and your health needs are our priority.  As part of our continuing mission to provide you with exceptional heart care, we have created designated Provider Care Teams.  These Care Teams include your primary Cardiologist (physician) and Advanced Practice Providers (APPs -  Physician Assistants and Nurse Practitioners) who all work together to provide you with the care you need, when you need it. You will need a follow up appointment in 6 months.  Please call our office 2 months in advance to schedule this appointment.  You may see Dr Radford Pax or one of the following Advanced Practice Providers on your designated Care Team:   Lyda Jester, PA-C Melina Copa, PA-C . Ermalinda Barrios, PA-C  Any Other Special Instructions Will Be Listed Below (If Applicable).

## 2018-09-13 NOTE — Progress Notes (Signed)
09/13/2018 [name]   04/08/1955  [id number]  Primary Physician Dennard Schaumann Cammie Mcgee, MD Primary Cardiologist: Dr. Radford Pax Electrophysiologist: None   Reason for Visit/CC: 6 month f/u for chronic systolic HF  HPI:  [name] a 63 y.o.female, followed by Dr. Audie Clear a hx of DCM with EF 45%, HTN, LBBB, obesity and obstructive sleep apnea, on CPAP. Stress test in 2015 with inf and ant wall defects and normal coronary arteries by cath EF 30-35% and severe pulmonary HTN (PASP 67/39mmHg) and NSVT noted on Tele at time of hospitalization for cath. She had a 2D echo in August 2018 which showed mildly reduced LVF with EF 40-45% with diffuse HK. Mild AS. Mean AV gradient was 13 mmHg. She also has a history of PACs and PVCs by event monitor and chronic lower extremity edema. She lives a relatively sendenatary lifestyle. She works as a Recruitment consultant for [employer].   She presents to clinic today for her 65-month follow-up.  She was furloughed from her job due to [place] pandemic but plans to return to work in the next few weeks.  She just had her DOT physical in [place] yesterday and is needing to get her annual echocardiogram as part of the requirements.  Due to COVID quarantine she has gained weight.  Has been more sedentary at home watching TV television and eating more.  She has trouble with knee arthritis.  She has been getting cortisone injections but thinks that she will likely need to eventually have knee replacement surgery.  From a cardiac standpoint, she has done well.  She denies any anginal symptomatology.  No chest pain or dyspnea.  Euvolemic from a heart failure standpoint.  No edema.  Denies orthopnea and PND.  She reports she is fully compliant with her CPAP nightly.  Her blood pressures well controlled.  Her cholesterol has been poorly controlled.  She had a lipid panel done in February which showed elevated LDL at 145 mg/dL.  She has been intolerant to statins in the past  due to myalgias and arthralgias.  However her PCP started her on low-dose Crestor in February along with Zetia and she has tolerated this well.  She is fasting today.  Cardiac Studies   2D Echo 09/2017 Study Conclusions  - Left ventricle: The cavity size was normal. Systolic function was mildly to moderately reduced. The estimated ejection fraction was in the range of 40% to 45%. Mild diffuse hypokinesis with no identifiable regional variations. Features are consistent with a pseudonormal left ventricular filling pattern, with concomitant abnormal relaxation and increased filling pressure (grade 2 diastolic dysfunction). Doppler parameters are consistent with high ventricular filling pressure. - Aortic valve: Poorly visualized. There was mild stenosis. Mean gradient (S): 13 mm Hg. Valve area (VTI): 1.5 cm^2. Valve area (Vmax): 1.45 cm^2. Valve area (Vmean): 1.31 cm^2. - Mitral valve: Calcified annulus. - Left atrium: The atrium was mildly dilated. - Pulmonary arteries: Systolic pressure could not be accurately estimated.   Current Meds  Medication Sig  . acetaminophen (TYLENOL 8 HOUR ARTHRITIS PAIN) 650 MG CR tablet Take 1,300 mg by mouth as needed for pain.  Marland Kitchen aspirin 81 MG tablet Take 81 mg by mouth daily.  . carvedilol (COREG) 6.25 MG tablet Take 1 tablet (6.25 mg total) by mouth 2 (two) times daily with a meal.  . cholecalciferol (VITAMIN D) 1000 units tablet Take 1,000 Units by mouth daily.  Marland Kitchen ezetimibe (ZETIA) 10 MG tablet TAKE 1 TABLET BY MOUTH ONCE DAILY  .  furosemide (LASIX) 40 MG tablet TAKE ONE TO TWO TABLETS BY MOUTH DAILY AS DIRECTED  . gabapentin (NEURONTIN) 100 MG capsule Take 1 capsule (100 mg total) by mouth 2 (two) times daily.  . mometasone (NASONEX) 50 MCG/ACT nasal spray Place 2 sprays into the nose daily.  . Multiple Vitamin (MULTIVITAMIN) tablet Take 1 tablet by mouth daily.  . rosuvastatin (CRESTOR) 5 MG tablet Take 1 tablet (5 mg total)  by mouth daily.  . sacubitril-valsartan (ENTRESTO) 24-26 MG Take 1 tablet by mouth 2 (two) times daily.  Marland Kitchen spironolactone (ALDACTONE) 25 MG tablet Take 0.5 tablets (12.5 mg total) by mouth daily.  . vitamin C (ASCORBIC ACID) 500 MG tablet Take 500 mg by mouth as needed.    Allergies  Allergen Reactions  . Atorvastatin Other (See Comments)    REACTION: mylagias  . Pravastatin Sodium Other (See Comments)    REACTION: myalgias  . Sulfa Antibiotics Hives   Past Medical History:  Diagnosis Date  . Anxiety   . Aortic stenosis, mild    echo 2017  . Breast cancer ([place])    left breast  . Carpal tunnel syndrome   . Cataract   . Chronic diastolic CHF (congestive heart failure) (HCC)    NYHA class II  . Hyperlipidemia   . Hypertension   . Left bundle branch block (LBBB)   . Low back pain   . Neuropathy   . NICM (nonischemic cardiomyopathy) ([place])    EF 30%, cath 05/20/2015 clean coronary.  EF resolved by echo with EF 55%  . OSA on CPAP   . Prediabetes   . Pulmonary HTN ([place]) 05/2015   Severe with PASP 67/54mmHg - resolved on followup echo  . PVC's (premature ventricular contractions) 11/04/2015   Family History  Problem Relation Age of Onset  . Arthritis Mother   . Depression Mother   . Hearing loss Mother   . Hyperlipidemia Mother   . Miscarriages / [place] Mother   . Colon cancer Mother 68       2010  . Alcohol abuse Father   . Cancer Father        PROSTATE  . Depression Father   . Hypertension Father   . Hyperlipidemia Father   . Drug abuse Brother   . Early death Maternal Grandmother   . Cancer Paternal Grandmother   . Heart attack Neg Hx    Past Surgical History:  Procedure Laterality Date  . CARDIAC CATHETERIZATION N/A 05/20/2015   Procedure: Right/Left Heart Cath and Coronary Angiography;  Surgeon: Troy Sine, MD;  Location: [facility];  Service: Cardiovascular;  Laterality: N/A;  . CATARACT EXTRACTION W/ INTRAOCULAR LENS  IMPLANT, BILATERAL    .  DILATION AND CURETTAGE OF UTERUS    . lumpectomy left breast    . UTERINE FIBROID SURGERY     Social History   Socioeconomic History  . Marital status: Significant Other    Spouse name: Not on file  . Number of children: Not on file  . Years of education: Not on file  . Highest education level: Not on file  Occupational History  . 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 Financial resource strain: Not on file  . Food insecurity    Worry: Not on file    Inability: Not on file  . Transportation needs    Medical: Not on file    Non-medical: Not on file  Tobacco Use  . Smoking status: Former Smoker  Quit date: 06/18/2009    Years since quitting: 9.2  . Smokeless tobacco: Never Used  Substance and Sexual Activity  . Alcohol use: Yes    Comment: once every 2 months  . Drug use: No  . Sexual activity: Yes  Lifestyle  . Physical activity    Days per week: Not on file    Minutes per session: Not on file  . Stress: Not on file  Relationships  . Social Herbalist on phone: Not on file    Gets together: Not on file    Attends religious service: Not on file    Active member of club or organization: Not on file    Attends meetings of clubs or organizations: Not on file    Relationship status: Not on file  . Intimate partner violence    Fear of current or ex partner: Not on file    Emotionally abused: Not on file    Physically abused: Not on file    Forced sexual activity: Not on file  Other Topics Concern  . Not on file  Social History Narrative  . Not on file     Lipid Panel     Component Value Date/Time   CHOL 234 (H) 04/05/2018 1156   CHOL 262 (H) 05/24/2017 1054   TRIG 73 04/05/2018 1156   HDL 72 04/05/2018 1156   HDL 57 05/24/2017 1054   CHOLHDL 3.3 04/05/2018 1156   VLDL 14 03/21/2016 1056   LDLCALC 145 (H) 04/05/2018 1156    Review of Systems: General: negative for chills, fever, night sweats or weight changes.  Cardiovascular: negative for  chest pain, dyspnea on exertion, edema, orthopnea, palpitations, paroxysmal nocturnal dyspnea or shortness of breath Dermatological: negative for rash Respiratory: negative for cough or wheezing Urologic: negative for hematuria Abdominal: negative for nausea, vomiting, diarrhea, bright red blood per rectum, melena, or hematemesis Neurologic: negative for visual changes, syncope, or dizziness All other systems reviewed and are otherwise negative except as noted above.   Physical Exam:  Blood pressure 120/78, pulse 70, height 5' 4.5" (1.638 m), weight 260 lb (117.9 kg), SpO2 96 %.  General appearance: alert, cooperative, no distress and morbidly obese Neck: no carotid bruit and no JVD Lungs: clear to auscultation bilaterally Heart: regular rate and rhythm, S1, S2 normal, no murmur, click, rub or gallop Extremities: extremities normal, atraumatic, no cyanosis or edema Pulses: 2+ and symmetric Skin: Skin color, texture, turgor normal. No rashes or lesions Neurologic: Grossly normal  EKG normal sinus rhythm, right atrial enlargement, nonspecific intraventricular block, 70 bpm-- personally reviewed   ASSESSMENT AND PLAN:   Chronic Combined Systolic and Diastolic HF: her last echo in August 2019 showed EF of 40-45%, c/w prior studies in the past. G2DD also noted. LHC in 2017 showed normal coronaries. She is stable w/o dyspnea. Euvolemic on exam.  No edema BP and HR stable. Continue Entresto, Coreg and spironolactone.  We will check BMP today for medication monitoring.  Will order annual echocardiogram, as this is needed for her DOT physical.  Aortic Stenosis: mild by echo 09/2017. Mean gradient 13 mmHg.  Denies chest pain and dyspnea.  Will order follow-up echo for DOT annual requirements.  HTN: controlled on current regimen. BP is 120/78 today.  No changes made today.  She is on Entresto, spironolactone and Lasix for systolic heart failure.  Will check BMP today.  OSA: reports compliance w/  CPAP. Followed by Dr. Radford Pax.   LBBB: chronic  Obesity:  She lives a sedentary lifestyle.  She works as a Recruitment consultant for [employer] and has also put on additional weight due to the [place] pandemic.  She was furloughed from her job.  Was at home watching television and eating more.  She plans to restart the whole 30 diet which she has tried in the past and had some success with.  She seems motivated to restart her diet.  Knee osteoarthritis: Patient is planning on conservative management for now.  She has been getting cortisone injections with subjective improvement.  She may ultimately need knee replacement surgery further down the road but she is not interested in pursuing this at this time.     HLD: H/o statin intolerance but was started on Low dose crestor, 5 mg in Feb, along w/ zetia. She is tolerating this well. Last lipid panel prior to starting Crestor showed elevated LDL at 145 mg/dl. She is fasting today. We will repeat FLP and HFTs today.    Follow-Up w/ Dr. Radford Pax in 6 months    Ladoris Gene, MHS [facility] 09/13/2018 9:14 AM

## 2018-09-14 ENCOUNTER — Telehealth: Payer: Self-pay

## 2018-09-14 NOTE — Telephone Encounter (Signed)
Phoned patient, informed of results, verbalized understanding, no further questions or concerns.

## 2018-09-20 ENCOUNTER — Other Ambulatory Visit: Payer: Self-pay

## 2018-09-20 ENCOUNTER — Ambulatory Visit (HOSPITAL_COMMUNITY): Payer: Managed Care, Other (non HMO) | Attending: Cardiology

## 2018-09-20 DIAGNOSIS — I350 Nonrheumatic aortic (valve) stenosis: Secondary | ICD-10-CM | POA: Diagnosis present

## 2018-09-20 DIAGNOSIS — E785 Hyperlipidemia, unspecified: Secondary | ICD-10-CM | POA: Diagnosis present

## 2018-09-20 DIAGNOSIS — I493 Ventricular premature depolarization: Secondary | ICD-10-CM

## 2018-09-20 DIAGNOSIS — I447 Left bundle-branch block, unspecified: Secondary | ICD-10-CM | POA: Diagnosis present

## 2018-09-20 DIAGNOSIS — I5022 Chronic systolic (congestive) heart failure: Secondary | ICD-10-CM

## 2018-09-20 DIAGNOSIS — Z79899 Other long term (current) drug therapy: Secondary | ICD-10-CM

## 2018-09-20 MED ORDER — PERFLUTREN LIPID MICROSPHERE
1.0000 mL | INTRAVENOUS | Status: AC | PRN
  Administered 2018-09-20: 10:00:00 2 mL via INTRAVENOUS

## 2018-09-24 ENCOUNTER — Telehealth: Payer: Self-pay

## 2018-09-24 NOTE — Telephone Encounter (Signed)
Notes recorded by Frederik Schmidt, RN on 09/24/2018 at 9:07 AM EDT  Lpm with results 8/10  ------

## 2018-09-24 NOTE — Telephone Encounter (Signed)
-----   Message from Consuelo Pandy, [place] sent at 09/21/2018  1:18 PM EDT ----- Echo is ok, compared to previous study last year, there are no significant changes.

## 2018-09-24 NOTE — Telephone Encounter (Signed)
The patient has been notified of the result and verbalized understanding.  All questions (if any) were answered. Frederik Schmidt, RN 09/24/2018 1:24 PM

## 2018-09-24 NOTE — Telephone Encounter (Signed)
Patient is returning call for results. 

## 2018-09-25 ENCOUNTER — Telehealth: Payer: Self-pay | Admitting: Cardiology

## 2018-09-25 NOTE — Telephone Encounter (Signed)
New Message ° ° °Patient returning your call. °

## 2018-09-26 NOTE — Telephone Encounter (Signed)
Pt has been notified of echo results by phone with verbal understanding. Pt thanked me for the call. Patient notified of result.  Please refer to phone note from today for complete details.   Julaine Hua, [facility] 09/26/2018 10:47 AM   Pt asked what can she do to make her heart stronger. I advised her to remember our heart is a muscle and needs to be exercised and cared for. Eating a healthy diet and watching Na intake. She states she is trying to loose weight and started drinking this tea (IASO TEA), drinks one a day. I advised she should d/w her cardiologist first as to trying any type of supplements for weight loss that are not prescribed by her doctor. I advised her that these supplements can actually interact and affect how her medications work. Pt states she was thinking about using the IASO drops as well to help with weight loss and energy. She states the drops say they have alcohol in them. I advised her I would not start anything like that w/o d/w the doctor first. Pt states she is also taking a B12 and taking Melatonin at night to help sleep. I expressed to her the importance of letting her doctor know of these OTC supplements as some of them can interfere with her medications she currently takes. I stated I will send a message to Dr. Radford Pax about these [place] for further advice and recommendations if safe to take. I advised her we will call back after we have d/w Dr. Radford Pax. Pt thanked me for the call and my help.

## 2018-09-27 NOTE — Telephone Encounter (Signed)
Left message for patient to return call.

## 2018-09-27 NOTE — Telephone Encounter (Signed)
I do not recommend the drops for weight loss.  She needs to follow a mediterranean diet and exercise

## 2018-09-28 NOTE — Telephone Encounter (Signed)
Unable to reach pt mailbox full and cannot leave message .Adonis Housekeeper

## 2018-10-12 ENCOUNTER — Telehealth: Payer: Self-pay

## 2018-10-12 NOTE — Telephone Encounter (Signed)
I spoke to the patient with Dr Theodosia Blender recommendation.  She verbalized understanding and was thankful for the call.

## 2018-10-24 ENCOUNTER — Other Ambulatory Visit: Payer: Self-pay | Admitting: Cardiology

## 2018-10-29 ENCOUNTER — Telehealth: Payer: Self-pay | Admitting: Cardiology

## 2018-10-29 NOTE — Telephone Encounter (Signed)
Will route to medical records 

## 2018-10-29 NOTE — Telephone Encounter (Signed)
New Message   Patient is calling to request a copy of her echocardiogram results. She request for it to be emailed to her or she can pick it up.  Please call.

## 2018-11-02 ENCOUNTER — Ambulatory Visit: Payer: Managed Care, Other (non HMO)

## 2018-11-08 ENCOUNTER — Other Ambulatory Visit: Payer: Self-pay

## 2018-11-08 ENCOUNTER — Ambulatory Visit (INDEPENDENT_AMBULATORY_CARE_PROVIDER_SITE_OTHER): Payer: Managed Care, Other (non HMO) | Admitting: *Deleted

## 2018-11-08 DIAGNOSIS — Z23 Encounter for immunization: Secondary | ICD-10-CM

## 2018-11-08 NOTE — Progress Notes (Signed)
Patient seen in office for Influenza Vaccination.   Tolerated IM administration well.   Immunization history updated.  

## 2018-11-28 ENCOUNTER — Telehealth: Payer: Self-pay

## 2018-11-28 NOTE — Telephone Encounter (Signed)
Error

## 2018-12-11 ENCOUNTER — Ambulatory Visit: Payer: Managed Care, Other (non HMO) | Admitting: Family Medicine

## 2018-12-17 ENCOUNTER — Encounter: Payer: Self-pay | Admitting: Family Medicine

## 2018-12-17 ENCOUNTER — Other Ambulatory Visit: Payer: Self-pay

## 2018-12-17 ENCOUNTER — Ambulatory Visit: Payer: Managed Care, Other (non HMO) | Admitting: Family Medicine

## 2018-12-17 VITALS — BP 120/80 | HR 80 | Temp 97.7°F | Resp 18 | Ht 64.5 in | Wt 256.0 lb

## 2018-12-17 DIAGNOSIS — R310 Gross hematuria: Secondary | ICD-10-CM

## 2018-12-17 DIAGNOSIS — M791 Myalgia, unspecified site: Secondary | ICD-10-CM

## 2018-12-17 LAB — URINALYSIS, ROUTINE W REFLEX MICROSCOPIC
Bacteria, UA: NONE SEEN /HPF
Bilirubin Urine: NEGATIVE
Glucose, UA: NEGATIVE
Hyaline Cast: NONE SEEN /LPF
Ketones, ur: NEGATIVE
Leukocytes,Ua: NEGATIVE
Nitrite: NEGATIVE
Protein, ur: NEGATIVE
Specific Gravity, Urine: 1.025 (ref 1.001–1.03)
WBC, UA: NONE SEEN /HPF (ref 0–5)
pH: 5.5 (ref 5.0–8.0)

## 2018-12-17 LAB — MICROSCOPIC MESSAGE

## 2018-12-17 NOTE — Progress Notes (Signed)
Subjective:    Patient ID: [name], female    DOB: Jan 15, 1956, 63 y.o.   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  She states for the last 3 or 4 times they have mentioned a microscopic amount of blood in her urine.  However she has never followed up with that.  In December, there was a trace amount of blood on her urinalysis.  Recently this week, the patient had an episode of gross hematuria.  She states that the urine was red in color.  Today her urine is back to normal color however there is still a trace amount of blood on urinalysis.  She denies any dysuria or urgency or frequency.  She denies any back pain consistent with a kidney stone.  She also complains of muscle pains all over her body.  She thinks she may have fibromyalgia.  She states that she hurts everywhere.  However when I asked her to be specific she reports pain in her right shoulder with abduction greater than 90 degrees.  It also hurts with internal and external rotation.  She complains of bilateral knee pain.  She reports pain in her fingers and in her hands. Past Medical History:  Diagnosis Date  . Anxiety   . Aortic stenosis, mild    echo 2017  . Breast cancer ([place])    left breast  . Carpal tunnel syndrome   . Cataract   . Chronic diastolic CHF (congestive heart failure) (HCC)    NYHA class II  . Hyperlipidemia   . Hypertension   . Left bundle branch block (LBBB)   . Low back pain   . Neuropathy   . NICM (nonischemic cardiomyopathy) ([place])    EF 30%, cath 05/20/2015 clean coronary.  EF resolved by echo with EF 55%  . OSA on CPAP   . Prediabetes   . Pulmonary HTN ([place]) 05/2015   Severe with PASP 67/54mmHg - resolved on followup echo  . PVC's (premature ventricular contractions) 11/04/2015   Past Surgical History:  Procedure Laterality Date  . CARDIAC CATHETERIZATION N/A 05/20/2015   Procedure: Right/Left Heart Cath and Coronary Angiography;  Surgeon: Troy Sine, MD;  Location: [facility];  Service: Cardiovascular;  Laterality: N/A;  . CATARACT EXTRACTION W/ INTRAOCULAR LENS  IMPLANT, BILATERAL    . DILATION AND CURETTAGE OF UTERUS    . lumpectomy left breast    . UTERINE FIBROID SURGERY     Current Outpatient Medications on File Prior to Visit  Medication Sig Dispense Refill  . acetaminophen (TYLENOL 8 HOUR ARTHRITIS PAIN) 650 MG CR tablet Take 1,300 mg by mouth as needed for pain.    Marland Kitchen aspirin 81 MG tablet Take 81 mg by mouth daily.    . carvedilol (COREG) 6.25 MG tablet Take 1 tablet (6.25 mg total) by mouth 2 (two) times daily with a meal. 180 tablet 3  . cholecalciferol (VITAMIN D) 1000 units tablet Take 1,000 Units by mouth daily.    Marland Kitchen ENTRESTO 24-26 MG Take 1 tablet by mouth twice daily 180 tablet 3  . ezetimibe (ZETIA) 10 MG tablet Take 1 tablet by mouth once daily 30 tablet 10  . furosemide (LASIX) 40 MG tablet TAKE ONE TO TWO TABLETS BY MOUTH DAILY AS DIRECTED 180 tablet 3  . gabapentin (NEURONTIN) 100 MG capsule Take 1 capsule (100 mg total) by mouth 2 (two) times daily. 60 capsule 0  . mometasone (NASONEX) 50 MCG/ACT nasal  spray Place 2 sprays into the nose daily. 17 g 12  . Multiple Vitamin (MULTIVITAMIN) tablet Take 1 tablet by mouth daily.    . rosuvastatin (CRESTOR) 5 MG tablet Take 1 tablet (5 mg total) by mouth daily. 90 tablet 3  . spironolactone (ALDACTONE) 25 MG tablet Take 1/2 (one-half) tablet by mouth once daily 45 tablet 3  . vitamin C (ASCORBIC ACID) 500 MG tablet Take 500 mg by mouth as needed.      No current facility-administered medications on file prior to visit.    Allergies  Allergen Reactions  . Atorvastatin Other (See Comments)    REACTION: mylagias  . Pravastatin Sodium Other (See Comments)    REACTION: myalgias  . Sulfa Antibiotics Hives   Social History   Socioeconomic History  . Marital status: Significant Other    Spouse name: Not on file  . Number of children: Not on file  . Years of education: Not on file   . Highest education level: Not on file  Occupational History  . 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 Financial resource strain: Not on file  . Food insecurity    Worry: Not on file    Inability: Not on file  . Transportation needs    Medical: Not on file    Non-medical: Not on file  Tobacco Use  . Smoking status: Former Smoker    Quit date: 06/18/2009    Years since quitting: 9.5  . Smokeless tobacco: Never Used  Substance and Sexual Activity  . Alcohol use: Yes    Comment: once every 2 months  . Drug use: No  . Sexual activity: Yes  Lifestyle  . Physical activity    Days per week: Not on file    Minutes per session: Not on file  . Stress: Not on file  Relationships  . Social Herbalist on phone: Not on file    Gets together: Not on file    Attends religious service: Not on file    Active member of club or organization: Not on file    Attends meetings of clubs or organizations: Not on file    Relationship status: Not on file  . Intimate partner violence    Fear of current or ex partner: Not on file    Emotionally abused: Not on file    Physically abused: Not on file    Forced sexual activity: Not on file  Other Topics Concern  . Not on file  Social History Narrative  . Not on file      Review of Systems  All other systems reviewed and are negative.      Objective:   Physical Exam Vitals signs reviewed.  Constitutional:      Appearance: She is obese.  Cardiovascular:     Rate and Rhythm: Normal rate and regular rhythm.     Pulses: Normal pulses.     Heart sounds: Normal heart sounds.  Pulmonary:     Effort: Pulmonary effort is normal.     Breath sounds: Normal breath sounds.  Abdominal:     General: Abdomen is flat. Bowel sounds are normal.     Palpations: Abdomen is soft.     Tenderness: There is no abdominal tenderness.  Musculoskeletal:     Right shoulder: She exhibits decreased range of motion and tenderness. She exhibits normal  strength.     Right knee: She exhibits decreased range of motion.     Left knee:  She exhibits decreased range of motion.  Neurological:     Mental Status: She is alert.           Assessment & Plan:  Gross hematuria - Plan: Urinalysis, Routine w reflex microscopic  Myalgia - Plan: CK, Sedimentation rate  Patient has a longstanding history of microscopic hematuria.  However this week she had an episode of gross hematuria.  Therefore I believe she would benefit from urology consultation for possible cystoscopy to rule out underlying causes of persistent microscopic hematuria.  I believe the pretest probability for malignancy in the bladder is extremely low however I cannot completely exclude this without cystoscopy.  I would also recommend checking a CT scan with renal stone protocol for gross hematuria.  I believe the pain in her shoulder is likely subacromial bursitis with impingement syndrome.  I believe her pain is multifactorial and likely due to obesity coupled with osteoarthritis in both knees.  However I will check a CK level and a sedimentation rate given the fact she is on a statin.  Rule out statin induced myopathy with a CK.  His sedimentation rate is elevated consider autoimmune diseases.  If lab work is normal, recommend a cortisone shot in the right subacromial space

## 2018-12-18 LAB — SEDIMENTATION RATE: Sed Rate: 17 mm/h (ref 0–30)

## 2018-12-18 LAB — CK: Total CK: 64 U/L (ref 29–143)

## 2018-12-25 ENCOUNTER — Telehealth: Payer: Self-pay | Admitting: Family Medicine

## 2018-12-25 ENCOUNTER — Other Ambulatory Visit: Payer: Managed Care, Other (non HMO)

## 2018-12-25 NOTE — Telephone Encounter (Signed)
Received a phone call from [place] with [employer] requesting that we fax over patient CT renal stone study orders to [facility] so that patient may have CT performed there. Order printed and placed on Dr. Samella Parr desk for signature. Will fax once signature is obtained to [phone]

## 2018-12-28 ENCOUNTER — Other Ambulatory Visit: Payer: Self-pay | Admitting: Cardiology

## 2019-01-21 ENCOUNTER — Encounter: Payer: Managed Care, Other (non HMO) | Admitting: Family Medicine

## 2019-04-01 ENCOUNTER — Ambulatory Visit: Payer: Managed Care, Other (non HMO) | Admitting: Physician Assistant

## 2019-04-10 ENCOUNTER — Other Ambulatory Visit: Payer: Self-pay | Admitting: Family Medicine

## 2019-04-25 ENCOUNTER — Encounter: Payer: Self-pay | Admitting: Cardiology

## 2019-04-25 ENCOUNTER — Telehealth (INDEPENDENT_AMBULATORY_CARE_PROVIDER_SITE_OTHER): Payer: Managed Care, Other (non HMO) | Admitting: Cardiology

## 2019-04-25 ENCOUNTER — Other Ambulatory Visit: Payer: Self-pay

## 2019-04-25 VITALS — BP 120/70 | HR 90 | Ht 64.5 in | Wt 250.0 lb

## 2019-04-25 DIAGNOSIS — I110 Hypertensive heart disease with heart failure: Secondary | ICD-10-CM

## 2019-04-25 DIAGNOSIS — I5042 Chronic combined systolic (congestive) and diastolic (congestive) heart failure: Secondary | ICD-10-CM

## 2019-04-25 DIAGNOSIS — G4733 Obstructive sleep apnea (adult) (pediatric): Secondary | ICD-10-CM | POA: Diagnosis not present

## 2019-04-25 DIAGNOSIS — I5022 Chronic systolic (congestive) heart failure: Secondary | ICD-10-CM

## 2019-04-25 DIAGNOSIS — Z6841 Body Mass Index (BMI) 40.0 and over, adult: Secondary | ICD-10-CM

## 2019-04-25 DIAGNOSIS — I350 Nonrheumatic aortic (valve) stenosis: Secondary | ICD-10-CM

## 2019-04-25 MED ORDER — FUROSEMIDE 40 MG PO TABS: ORAL_TABLET | ORAL | 3 refills | Status: DC

## 2019-04-25 NOTE — Patient Instructions (Addendum)
Medication Instructions:  .isntcur  *If you need a refill on your cardiac medications before your next appointment, please call your pharmacy*   Follow-Up: At [facility], you and your health needs are our priority.  As part of our continuing mission to provide you with exceptional heart care, we have created designated Provider Care Teams.  These Care Teams include your primary Cardiologist (physician) and Advanced Practice Providers (APPs -  Physician Assistants and Nurse Practitioners) who all work together to provide you with the care you need, when you need it.   Your next appointment:   6 month(s)  The format for your next appointment:   Either In Person or Virtual  Provider:   Fransico Him, MD   Other Instructions Please check your heart rate and blood pressure at lunch time daily for one week and send [place] a message with those readings.

## 2019-04-25 NOTE — Progress Notes (Signed)
Virtual Visit via Telephone Note   This visit type was conducted due to national recommendations for restrictions regarding the COVID-19 Pandemic (e.g. social distancing) in an effort to limit this patient's exposure and mitigate transmission in our community.  Due to her co-morbid illnesses, this patient is at least at moderate risk for complications without adequate follow up.  This format is felt to be most appropriate for this patient at this time.  All issues noted in this document were discussed and addressed.  A limited physical exam was performed with this format.  Please refer to the patient's chart for her consent to telehealth for [facility].   Evaluation Performed:  Follow-up visit  This visit type was conducted due to national recommendations for restrictions regarding the COVID-19 Pandemic (e.g. social distancing).  This format is felt to be most appropriate for this patient at this time.  All issues noted in this document were discussed and addressed.  No physical exam was performed (except for noted visual exam findings with Video Visits).  Please refer to the patient's chart (MyChart message for video visits and phone note for telephone visits) for the patient's consent to telehealth for [facility].  Date:  04/25/2019   ID:  [name], DOB 11/12/1955, MRN [id number]  Patient Location:  Home  Provider location:   [place]  PCP:  Jenna Luo, MD Cardiologist:  Fransico Him, MD Electrophysiologist:  None   Chief Complaint:  Chronic systolic CHF  History of Present Illness:    [name] is a 64 y.o. female who presents via audio/video conferencing for a telehealth visit today.    [place] a [id number].femalewith a hx of DCM with EF 45%, HTN, LBBB, obesityand obstructive sleep apnea, on CPAP. Stress test in 2015 with inf and ant wall defects and normal coronary arteries by cath EF 30-35% and severe pulmonary HTN (PASP 67/41mmHg) and NSVT  noted on Tele at time of hospitalization for cath. She had a 2D echo in August 2018which showed mildly reduced LVF with EF 40-45% with diffuse HK. Mild AS. Mean AV gradient was 13 mmHg.She also has a history of PACs and PVCs by event monitor and chronic lower extremity edema.She lives a relatively sendenatary lifestyle. She works as a Recruitment consultant for [employer]. She was seen in July 2020 and was doing well.    She is here today for followup and is doing well.  She denies any chest pain or pressure, SOB, DOE, PND, orthopnea, LE edema, dizziness, palpitations or syncope. She is compliant with her meds and is tolerating meds with no SE.  She is doing well with her CPAP device and thinks that she has gotten used to it.  She tolerates the mask and feels the pressure is adequate.  Since going on CPAP she feels rested in the am and has no significant daytime sleepiness.  She denies any significant mouth or nasal dryness or nasal congestion.  She does not think that he snores.    The patient does not have symptoms concerning for COVID-19 infection (fever, chills, cough, or new shortness of breath).   Prior CV studies:   The following studies were reviewed today:  2D echo  Past Medical History:  Diagnosis Date  . Anxiety   . Aortic stenosis, mild    echo 2017  . Breast cancer ([place])    left breast  . Carpal tunnel syndrome   . Cataract   . Chronic diastolic CHF (congestive heart failure) ([place])  NYHA class II  . Hyperlipidemia   . Hypertension   . Left bundle branch block (LBBB)   . Low back pain   . Neuropathy   . NICM (nonischemic cardiomyopathy) ([place])    EF 30%, cath 05/20/2015 clean coronary.  EF resolved by echo with EF 55%  . OSA on CPAP   . Prediabetes   . Pulmonary HTN ([place]) 05/2015   Severe with PASP 67/57mmHg - resolved on followup echo  . PVC's (premature ventricular contractions) 11/04/2015   Past Surgical History:  Procedure Laterality Date  . CARDIAC CATHETERIZATION N/A  05/20/2015   Procedure: Right/Left Heart Cath and Coronary Angiography;  Surgeon: Troy Sine, MD;  Location: [place] [facility];  Service: Cardiovascular;  Laterality: N/A;  . CATARACT EXTRACTION W/ INTRAOCULAR LENS  IMPLANT, BILATERAL    . DILATION AND CURETTAGE OF UTERUS    . lumpectomy left breast    . UTERINE FIBROID SURGERY       Current Meds  Medication Sig  . acetaminophen (TYLENOL 8 HOUR ARTHRITIS PAIN) 650 MG CR tablet Take 1,300 mg by mouth as needed for pain.  Marland Kitchen aspirin 81 MG tablet Take 81 mg by mouth daily.  . carvedilol (COREG) 6.25 MG tablet TAKE 1 TABLET BY MOUTH TWICE DAILY WITH A MEAL  . cholecalciferol (VITAMIN D) 1000 units tablet Take 1,000 Units by mouth daily.  Marland Kitchen ENTRESTO 24-26 MG Take 1 tablet by mouth twice daily  . ezetimibe (ZETIA) 10 MG tablet Take 1 tablet by mouth once daily  . furosemide (LASIX) 40 MG tablet TAKE ONE TO TWO TABLETS BY MOUTH DAILY AS DIRECTED  . gabapentin (NEURONTIN) 100 MG capsule Take 1 capsule (100 mg total) by mouth 2 (two) times daily.  . Melatonin 10 MG TABS Take by mouth at bedtime.   . mometasone (NASONEX) 50 MCG/ACT nasal spray Place 2 sprays into the nose daily.  . Multiple Vitamin (MULTIVITAMIN) tablet Take 1 tablet by mouth daily.  . rosuvastatin (CRESTOR) 5 MG tablet Take 1 tablet by mouth once daily  . spironolactone (ALDACTONE) 25 MG tablet Take 1/2 (one-half) tablet by mouth once daily  . vitamin C (ASCORBIC ACID) 500 MG tablet Take 2,000 mg by mouth as needed.   . [DISCONTINUED] furosemide (LASIX) 40 MG tablet TAKE ONE TO TWO TABLETS BY MOUTH DAILY AS DIRECTED     Allergies:   Atorvastatin, Pravastatin sodium, and Sulfa antibiotics   Social History   Tobacco Use  . Smoking status: Former Smoker    Quit date: 06/18/2009    Years since quitting: 9.8  . Smokeless tobacco: Never Used  Substance Use Topics  . Alcohol use: Yes    Comment: once every 2 months  . Drug use: No     Family Hx: The patient's family  history includes Alcohol abuse in her father; Arthritis in her mother; Cancer in her father and paternal grandmother; Colon cancer (age of onset: 81) in her mother; Depression in her father and mother; Drug abuse in her brother; Early death in her maternal grandmother; Hearing loss in her mother; Hyperlipidemia in her father and mother; Hypertension in her father; Miscarriages / [place] in her mother. There is no history of Heart attack.  ROS:   Please see the history of present illness.     All other systems reviewed and are negative.   Labs/Other Tests and Data Reviewed:    Recent Labs: 09/13/2018: ALT 29; BUN 13; Creatinine, Ser 0.66; Potassium 4.3; Sodium 139  Recent Lipid Panel Lab Results  Component Value Date/Time   CHOL 150 09/13/2018 10:00 AM   TRIG 102 09/13/2018 10:00 AM   HDL 56 09/13/2018 10:00 AM   CHOLHDL 2.7 09/13/2018 10:00 AM   CHOLHDL 3.3 04/05/2018 11:56 AM   LDLCALC 74 09/13/2018 10:00 AM   LDLCALC 145 (H) 04/05/2018 11:56 AM    Wt Readings from Last 3 Encounters:  04/25/19 250 lb (113.4 kg)  12/17/18 256 lb (116.1 kg)  09/13/18 260 lb (117.9 kg)     Objective:    Vital Signs:  Ht 5' 4.5" (1.638 m)   Wt 250 lb (113.4 kg)   BMI 42.25 kg/m      ASSESSMENT & PLAN:    1.  Chronic combined systolic/diastolic CHF -2D echo [id number] showed mildly reduced LVF with EF 40-45% with global HK and septal-lateral dyssynchrony c/s BBB -cardiac cath showed normal coronary arteries -she denies any SOB or LE edema -weight is stable -continue Entresto 24-26mg  BID, Lasix and spiro 12.5mg  daily and Carvedilol 6.25mg  BID -I have asked her to check her BP and HR daily at lunch daily and call in a week with the readings -if HR above 80bpm will increase carvedilol otherwise increase Entresto -creatinine was 0.66 and K+ 4.3 in July 2020  2.  Aortic stenosis  -mild AS by echo 09/2018 with mean AVG 83mmHg and dimensionless index 0.38, peak velocity 222cm/s and AVA  1.32cm2.  -repeat echo in 2 years  3.  HTN -continue Entresto, spiro and Lasix  4. OSA - The patient is tolerating PAP therapy well without any problems. The PAP download was reviewed today and showed an AHI of 4.4/hr on 13 cm H2O with 90% compliance in using more than 4 hours nightly.  The patient has been using and benefiting from PAP use and will continue to benefit from therapy.   5.  Morbid obesity -I have encouraged her to get into a routine exercise program and cut back on carbs and portions.   COVID-19 Education: The signs and symptoms of COVID-19 were discussed with the patient and how to seek care for testing (follow up with PCP or arrange E-visit).  The importance of social distancing was discussed today.  Patient Risk:   After full review of this patient's clinical status, I feel that they are at least moderate risk at this time.  Time:   Today, I have spent 20 minutes on telemedicine discussing medical problems including OSA, DCM, CHF, HTN and reviewing patient's chart including outside labs from PCP on KPN, 2D echo.  Medication Adjustments/Labs and Tests Ordered: Current medicines are reviewed at length with the patient today.  Concerns regarding medicines are outlined above.  Tests Ordered: No orders of the defined types were placed in this encounter.  Medication Changes: Meds ordered this encounter  Medications  . furosemide (LASIX) 40 MG tablet    Sig: TAKE ONE TO TWO TABLETS BY MOUTH DAILY AS DIRECTED    Dispense:  180 tablet    Refill:  3    Disposition:  Follow up in 6 month(s)  Signed, Fransico Him, MD  04/25/2019 3:11 PM    [facility]

## 2019-04-30 ENCOUNTER — Telehealth: Payer: Self-pay | Admitting: Family Medicine

## 2019-04-30 NOTE — Telephone Encounter (Signed)
I did not order any scan - she will need to contact the MD who ordered her scan

## 2019-04-30 NOTE — Telephone Encounter (Signed)
Pt called wanted to know results from her scan she had done at [facility]. (it was done at [facility] on 02/25/2019)

## 2019-04-30 NOTE — Telephone Encounter (Signed)
Left message for patient

## 2019-04-30 NOTE — Telephone Encounter (Signed)
Note was mistakenly routed to Dr. Radford Pax as she was listed in chart as pt's PCP. Note routed to Dr. Dennard Schaumann and chart updated to show Dr. Dennard Schaumann as pt's PCP.

## 2019-04-30 NOTE — Telephone Encounter (Signed)
I have not received any scan reports and their are no results in epic that I can find (last saw her in 11/20) Will need more information. I recommended a urology consult then and ct of the kidneys for hematuria but I have received no records.

## 2019-05-02 NOTE — Telephone Encounter (Signed)
Crystal went over CT and pt given results and informed that she was to see urology and was scheduled for an apt. Not sure what happened with that. Call pt LMOVM with phone number to urology that she may call and set up an apt.

## 2019-05-09 ENCOUNTER — Telehealth: Payer: Self-pay | Admitting: Cardiology

## 2019-05-09 NOTE — Telephone Encounter (Signed)
BPs look great

## 2019-05-09 NOTE — Telephone Encounter (Signed)
Advised patient that her blood pressures look great and she should continue on her current medications.

## 2019-05-09 NOTE — Telephone Encounter (Signed)
Error

## 2019-05-09 NOTE — Telephone Encounter (Signed)
Pt c/o BP issue: STAT if pt c/o blurred vision, one-sided weakness or slurred speech  1. What are your last 5 BP readings?   04/29/19:   12:00 PM - BP: 114/69   HR: 73  04/30/19:   12:21 PM - BP: 135/90   HR: 70  05/01/19:   12:00 PM - BP: 126/80   HR: 79  05/03/19:   12:18 PM - BP: 137/86   HR: 76  05/04/19:   12:07 PM - BP: 138/87   HR: 73  05/05/19    4:53 AM - BP: 135/73     HR: 80   5:00 PM - BP: 124/73     HR: 85  05/06/19   12:15 PM - BP: 110/74    HR: 76  2. Are you having any other symptoms (ex. Dizziness, headache, blurred vision, passed out)? No, however she states she assumes she is developing a sinus infection.  3. What is your BP issue? Patient states she is calling to report her BP readings for the past week.

## 2019-05-10 ENCOUNTER — Other Ambulatory Visit: Payer: Self-pay

## 2019-05-10 ENCOUNTER — Encounter: Payer: Self-pay | Admitting: Family Medicine

## 2019-05-10 ENCOUNTER — Ambulatory Visit (INDEPENDENT_AMBULATORY_CARE_PROVIDER_SITE_OTHER): Payer: Managed Care, Other (non HMO) | Admitting: Family Medicine

## 2019-05-10 DIAGNOSIS — B349 Viral infection, unspecified: Secondary | ICD-10-CM | POA: Diagnosis not present

## 2019-05-10 DIAGNOSIS — J0100 Acute maxillary sinusitis, unspecified: Secondary | ICD-10-CM | POA: Diagnosis not present

## 2019-05-10 MED ORDER — AMOXICILLIN 875 MG PO TABS: 875.0000 mg | ORAL_TABLET | Freq: Two times a day (BID) | ORAL | 0 refills | Status: DC

## 2019-05-10 NOTE — Progress Notes (Signed)
Virtual Visit via Telephone Note  I connected with [name] on 05/10/19 at 9:31am by telephone and verified that I am speaking with the correct person using two identifiers.      Pt location: at home   Physician location:  In office, [facility], Vic Blackbird MD     On call: patient and physician   I discussed the limitations, risks, security and privacy concerns of performing an evaluation and management service by telephone and the availability of in person appointments. I also discussed with the patient that there may be a patient responsible charge related to this service. The patient expressed understanding and agreed to proceed.   History of Present Illness: On March 21st started feeling bad. Had headache with nasal congestion.This has progressed to now having cough with body aches.  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  She has underlying CHF, OSA, HTN  Her typical temp is 97-98  COVID-19 vaccine   Observations/Objective:  8 am this AM, BP 134/88  HR 77  , Tmax today 98.67F  ( had Tylenol last 11pm last night )   NAD noted, congested sounded  Able to speak in full sentences  No difficulty   Assessment and Plan: Sinusitis symptoms along with other symptoms concerning for COVID-19 infection.  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  She is also a high risk patient secondary to her cardiovascular history.  I recommended Covid testing.  Patient was a little reluctant as she want to  get her second Covid vaccine tomorrow.  I recommend against going to get vaccination knowing that she has active symptoms.  Anterior nasal swab performed in the parking lot of my office.  She had normal work of breathing.  O2 sat was 94%. She will quarantine until her test results return.  I will empirically start her on amoxicillin she will continue any allergy medication but she will switch to Coricidin for cough and congestion.  Follow Up Instructions:    I discussed the assessment and treatment plan with the patient. The patient was provided an opportunity to ask questions and all were answered. The patient agreed with the plan and demonstrated an understanding of the instructions.   The patient was advised to call back or seek an in-person evaluation if the symptoms worsen or if the condition fails to improve as anticipated.  I provided  17 minutes of non-face-to-face time during this encounter. End Time: 9: 48am   Vic Blackbird, MD

## 2019-05-11 LAB — SARS-COV-2 RNA,(COVID-19) QUALITATIVE NAAT: SARS CoV2 RNA: NOT DETECTED

## 2019-05-24 ENCOUNTER — Telehealth: Payer: Self-pay

## 2019-05-24 NOTE — Telephone Encounter (Signed)
I called her and scheduled with Jeneen Rinks for Wednesday 05/29/19 @ 915

## 2019-05-24 NOTE — Telephone Encounter (Signed)
Pt called and said that she is having significant knee pain and last had an injection 05/2018. She is wanting an appt as soon as she can with Dr. Louanne Skye or Jeneen Rinks to discuss next steps or if a gel injection may be helpful. She states that the pain has returned and that she is having trouble walking.

## 2019-05-29 ENCOUNTER — Other Ambulatory Visit: Payer: Self-pay

## 2019-05-29 ENCOUNTER — Encounter: Payer: Self-pay | Admitting: Surgery

## 2019-05-29 ENCOUNTER — Ambulatory Visit: Payer: Self-pay

## 2019-05-29 ENCOUNTER — Ambulatory Visit: Payer: Managed Care, Other (non HMO) | Admitting: Surgery

## 2019-05-29 DIAGNOSIS — M1712 Unilateral primary osteoarthritis, left knee: Secondary | ICD-10-CM

## 2019-05-29 DIAGNOSIS — G8929 Other chronic pain: Secondary | ICD-10-CM | POA: Diagnosis not present

## 2019-05-29 DIAGNOSIS — M25562 Pain in left knee: Secondary | ICD-10-CM

## 2019-05-29 NOTE — Progress Notes (Signed)
Office Visit Note   Patient: [name]           Date of Birth: 02-28-1955           MRN: [id number] Visit Date: 05/29/2019              Requested by: Susy Frizzle, MD [address] [place],  [place] [address] PCP: Susy Frizzle, MD   Assessment & Plan: Visit Diagnoses:  1. Chronic pain of left knee   2. Unilateral primary osteoarthritis, left knee     Plan: Patient understands that best treatment option at this point would be left total knee replacement.  Patient does have multiple medical issues and will be needing preop medical and cardiac clearances.  I did give her the clearance forms to take to Dr. Golden Hurter cardiologist and Dr. Jenna Luo primary care.  She will work on getting these and once that is done she will follow-up with Dr. Louanne Skye to see if he is wanting to proceed with scheduling surgery.  Advised patient did not recommend repeating injection in the left knee just in case she does get the clearances that are needed and he is wanting to proceed with surgery.  Follow-Up Instructions: Return in about 4 weeks (around 06/26/2019) for with dr Louanne Skye to discuss possibleleft knee surgery and review surgical clearances.   Orders:  Orders Placed This Encounter  Procedures   XR KNEE 3 VIEW LEFT   No orders of the defined types were placed in this encounter.     Procedures: No procedures performed   Clinical Data: No additional findings.   Subjective: Chief Complaint  Patient presents with   Left Knee - Pain    HPI 64 year old female with history of end-stage DJD left knee and chronic pain comes in to discuss surgery.  States that knee pain continues to worsen.  She has been having some mechanical symptoms and feels like her knee wants to buckle at times.  She has had multiple intra-articular Marcaine/Depo-Medrol injections the most recent being in April 2020.  She states that she would like to proceed with scheduling definitive treatment  with total knee replacement.  Patient's problem list reviewed and she would be needing preop medical and cardiac clearances. Review of Systems No current cardiac pulmonary GI GU issues  Objective: Vital Signs: There were no vitals taken for this visit.  Physical Exam Constitutional:      Appearance: She is obese.  HENT:     Head: Normocephalic and atraumatic.  Eyes:     Extraocular Movements: Extraocular movements intact.     Pupils: Pupils are equal, round, and reactive to light.  Pulmonary:     Effort: No respiratory distress.  Musculoskeletal:     Comments: Gait is antalgic.  Negative log about his.  Left knee positive Tello femoral crepitus.  She is exquisitely tender at the medial joint line and lesser laterally.  Positive patellar grind.  Swelling with small effusion.  She does have a palpable tender Baker's cyst.  Calf nontender.  Neurovascular intact.  Skin:    General: Skin is warm and dry.  Neurological:     Mental Status: She is alert.  Psychiatric:        Mood and Affect: Mood normal.        Behavior: Behavior normal.     Ortho Exam  Specialty Comments:  No specialty comments available.  Imaging: No results found.   PMFS History: Patient Active Problem  List   Diagnosis Date Noted   Influenza A 11/07/2016   Prediabetes 11/07/2016   PVC's (premature ventricular contractions) 11/04/2015   Aortic stenosis, mild    Heart palpitations 07/28/2015   Morbid obesity ([place]) 05/23/2015   Hypertensive heart disease    Nonischemic cardiomyopathy ([facility])    NSVT (nonsustained ventricular tachycardia) ([place]) 05/21/2015   Chronic diastolic heart failure ([place]) 05/20/2015   OSA (obstructive sleep apnea) 01/02/2014   LBBB (left bundle branch block) 01/02/2014   Mixed hyperlipidemia 04/15/2010   Personal history of malignant neoplasm of breast 04/15/2010   Past Medical History:  Diagnosis Date   Anxiety    Aortic stenosis, mild    echo 2017   Breast  cancer ([place])    left breast   Carpal tunnel syndrome    Cataract    Chronic diastolic CHF (congestive heart failure) ([facility])    NYHA class II   Hyperlipidemia    Hypertension    Left bundle branch block (LBBB)    Low back pain    Neuropathy    NICM (nonischemic cardiomyopathy) ([place])    EF 30%, cath 05/20/2015 clean coronary.  EF resolved by echo with EF 55%   OSA on CPAP    Prediabetes    Pulmonary HTN ([place]) 05/2015   Severe with PASP 67/52mmHg - resolved on followup echo   PVC's (premature ventricular contractions) 11/04/2015    Family History  Problem Relation Age of Onset   Arthritis Mother    Depression Mother    Hearing loss Mother    Hyperlipidemia Mother    Miscarriages / [place] Mother    Colon cancer Mother 80       2010   Alcohol abuse Father    Cancer Father        PROSTATE   Depression Father    Hypertension Father    Hyperlipidemia Father    Drug abuse Brother    Early death Maternal Grandmother    Cancer Paternal Grandmother    Heart attack Neg Hx     Past Surgical History:  Procedure Laterality Date   CARDIAC CATHETERIZATION N/A 05/20/2015   Procedure: Right/Left Heart Cath and Coronary Angiography;  Surgeon: Troy Sine, MD;  Location: [facility];  Service: Cardiovascular;  Laterality: N/A;   CATARACT EXTRACTION W/ INTRAOCULAR LENS  IMPLANT, BILATERAL     DILATION AND CURETTAGE OF UTERUS     lumpectomy left breast     UTERINE FIBROID SURGERY     Social History   Occupational History   Occupation: Truck Geophysicist/field seismologist  Tobacco Use   Smoking status: Former Smoker    Quit date: 06/18/2009    Years since quitting: 9.9   Smokeless tobacco: Never Used  Substance and Sexual Activity   Alcohol use: Yes    Comment: once every 2 months   Drug use: No   Sexual activity: Yes

## 2019-05-30 ENCOUNTER — Telehealth: Payer: Self-pay | Admitting: *Deleted

## 2019-05-30 NOTE — Telephone Encounter (Signed)
Left message to call back and ask to speak with pre-op team. 

## 2019-05-30 NOTE — Telephone Encounter (Signed)
   [facility] Pre-operative Risk Assessment    Request for surgical clearance:  1. What type of surgery is being performed? LEFT TOTAL KNEE ARTHROPLASTY   2. When is this surgery scheduled? TBD   3. What type of clearance is required (medical clearance vs. Pharmacy clearance to hold med vs. Both)? MEDICAL  4. Are there any medications that need to be held prior to surgery and how long? ASA   5. Practice name and name of physician performing surgery? [facility]; DR. Jeneen Rinks NITKA   6. What is your office phone number [phone]    7.   What is your office fax number [phone] ATTN: SHERRIE  8.   Anesthesia type (None, local, MAC, general) ? GENERAL/BLOCK   Julaine Hua 05/30/2019, 2:34 PM  _________________________________________________________________   (provider comments below)

## 2019-05-31 NOTE — Telephone Encounter (Signed)
Follow up: ° ° ° °Patient returning call from yesterday. Please call patient back. °

## 2019-06-04 ENCOUNTER — Encounter: Payer: Self-pay | Admitting: Family Medicine

## 2019-06-04 ENCOUNTER — Other Ambulatory Visit: Payer: Self-pay

## 2019-06-04 ENCOUNTER — Ambulatory Visit (INDEPENDENT_AMBULATORY_CARE_PROVIDER_SITE_OTHER): Payer: Managed Care, Other (non HMO) | Admitting: Family Medicine

## 2019-06-04 VITALS — BP 118/76 | HR 73 | Temp 97.6°F | Resp 16 | Ht 64.5 in | Wt 258.0 lb

## 2019-06-04 DIAGNOSIS — I428 Other cardiomyopathies: Secondary | ICD-10-CM | POA: Diagnosis not present

## 2019-06-04 DIAGNOSIS — E782 Mixed hyperlipidemia: Secondary | ICD-10-CM | POA: Diagnosis not present

## 2019-06-04 DIAGNOSIS — E119 Type 2 diabetes mellitus without complications: Secondary | ICD-10-CM

## 2019-06-04 NOTE — Telephone Encounter (Signed)
OK to hold ASA for procedure

## 2019-06-04 NOTE — Progress Notes (Signed)
Subjective:    Patient ID: [name], female    DOB: Jan 18, 1956, 64 y.o.   MRN: [id number]  HPI  03/2018 Patient has a history of nonischemic cardiomyopathy as well as type 2 diabetes mellitus.  She is here today for a follow-up of her chronic medical conditions.  Last year in August, the patient weighed 261 pounds.  Through diet and exercise she is lost down to 245 pounds.  Unfortunately she is developed severe pain in her left knee stemming from that.  She is seeing an orthopedist who is performing cortisone injections in her knee and is suggested that she may need a knee replacement.  This summer, her hemoglobin A1c was 6.8.  With exercise and weight loss in December it is dropped to 6.2.  She is here today to recheck that.  She denies any polyuria, polydipsia, or blurry vision.  She denies any chest pain shortness of breath or dyspnea on exertion.  She is complaining of severe pain in her left knee.  She is also reporting worsening hearing loss.  On examination today, there is a tip to a hearing aid in her right auditory canal and there are 2 tips in her left auditory canal.  This was removed with a pair hemostats.  Her hearing improved after we remove the foreign bodies.  At that time, my plan was: Blood pressure today is excellent.  I am very proud of the patient for losing 16 pounds.  I encouraged the patient to continue regular daily aerobic exercise however she will have to find something that she can do without irritating her knee.  Perhaps she can use a stationary bike or rowing machine.  I will check a CBC, CMP, fasting lipid panel, and a hemoglobin A1c.  If her hemoglobin A1c is greater than 6.5, given her history of congestive heart failure, I would recommend starting either farxiga 10 mg a day Jardiance 25 mg a day.  Goal LDL cholesterol is less than 100.  Blood pressure is at goal.  I removed 3 foreign bodies.  2 from her left auditory canal and 1 from her right auditory  canal.  06/04/19 Patient has severe pain in her left knee.  She is scheduled to have a left total knee replacement under the care of Dr. Louanne Skye.  She is here for preoperative clearance.  Regarding her cardiovascular status, cardiology has cleared her to proceed with surgery based on failure note from April 15.  The patient denies any chest pain.  She denies any shortness of breath.  She denies any dyspnea on exertion.  She denies any paroxysmal nocturnal dyspnea.  She denies any peripheral edema.  Her blood pressure today is well controlled at 118/76.  Regarding her diabetes, she denies any polyuria, polydipsia, or blurry vision however she is overdue to recheck her hemoglobin A1c along with her fasting lipid panel.  Her most recent echocardiogram of her heart showed an ejection fraction of 40 to 45% in August 2020.  She denies any palpitations or tachyarrhythmias.  She denies any syncope or near syncope.  She denies any melena or hematochezia.  She is currently on a beta-blocker, Lasix, and spironolactone for management of her congestive heart failure.  I do not see angiotensin receptor blocker or an ACE inhibitor.  These are not listed in her allergy profile. Past Medical History:  Diagnosis Date  . Anxiety   . Aortic stenosis, mild    echo 2017  . Breast cancer ([place])  left breast  . Carpal tunnel syndrome   . Cataract   . Chronic diastolic CHF (congestive heart failure) (HCC)    NYHA class II  . Hyperlipidemia   . Hypertension   . Left bundle branch block (LBBB)   . Low back pain   . Neuropathy   . NICM (nonischemic cardiomyopathy) ([place])    EF 30%, cath 05/20/2015 clean coronary.  EF resolved by echo with EF 55%  . OSA on CPAP   . Prediabetes   . Pulmonary HTN ([place]) 05/2015   Severe with PASP 67/14mmHg - resolved on followup echo  . PVC's (premature ventricular contractions) 11/04/2015   Past Surgical History:  Procedure Laterality Date  . CARDIAC CATHETERIZATION N/A 05/20/2015    Procedure: Right/Left Heart Cath and Coronary Angiography;  Surgeon: Troy Sine, MD;  Location: [facility];  Service: Cardiovascular;  Laterality: N/A;  . CATARACT EXTRACTION W/ INTRAOCULAR LENS  IMPLANT, BILATERAL    . DILATION AND CURETTAGE OF UTERUS    . lumpectomy left breast    . UTERINE FIBROID SURGERY     Current Outpatient Medications on File Prior to Visit  Medication Sig Dispense Refill  . acetaminophen (TYLENOL 8 HOUR ARTHRITIS PAIN) 650 MG CR tablet Take 1,300 mg by mouth as needed for pain.    Marland Kitchen aspirin 81 MG tablet Take 81 mg by mouth daily.    . carvedilol (COREG) 6.25 MG tablet TAKE 1 TABLET BY MOUTH TWICE DAILY WITH A MEAL 180 tablet 2  . cholecalciferol (VITAMIN D) 1000 units tablet Take 1,000 Units by mouth daily.    Marland Kitchen ENTRESTO 24-26 MG Take 1 tablet by mouth twice daily 180 tablet 3  . ezetimibe (ZETIA) 10 MG tablet Take 1 tablet by mouth once daily 30 tablet 10  . furosemide (LASIX) 40 MG tablet TAKE ONE TO TWO TABLETS BY MOUTH DAILY AS DIRECTED 180 tablet 3  . gabapentin (NEURONTIN) 100 MG capsule Take 1 capsule (100 mg total) by mouth 2 (two) times daily. 60 capsule 0  . Melatonin 10 MG TABS Take by mouth at bedtime.     . mometasone (NASONEX) 50 MCG/ACT nasal spray Place 2 sprays into the nose daily. 17 g 12  . Multiple Vitamin (MULTIVITAMIN) tablet Take 1 tablet by mouth daily.    . rosuvastatin (CRESTOR) 5 MG tablet Take 1 tablet by mouth once daily 90 tablet 0  . spironolactone (ALDACTONE) 25 MG tablet Take 1/2 (one-half) tablet by mouth once daily 45 tablet 3  . vitamin C (ASCORBIC ACID) 500 MG tablet Take 2,000 mg by mouth as needed.      No current facility-administered medications on file prior to visit.   Allergies  Allergen Reactions  . Atorvastatin Other (See Comments)    REACTION: mylagias  . Pravastatin Sodium Other (See Comments)    REACTION: myalgias  . Sulfa Antibiotics Hives   Social History   Socioeconomic History  . Marital  status: Significant Other    Spouse name: Not on file  . Number of children: Not on file  . Years of education: Not on file  . Highest education level: Not on file  Occupational History  . Occupation: Truck Geophysicist/field seismologist  Tobacco Use  . Smoking status: Former Smoker    Quit date: 06/18/2009    Years since quitting: 9.9  . Smokeless tobacco: Never Used  Substance and Sexual Activity  . Alcohol use: Yes    Comment: once every 2 months  . Drug use:  No  . Sexual activity: Yes  Other Topics Concern  . Not on file  Social History Narrative  . Not on file   Social Determinants of Health   Financial Resource Strain:   . Difficulty of Paying Living Expenses:   Food Insecurity:   . Worried About Charity fundraiser in the Last Year:   . Arboriculturist in the Last Year:   Transportation Needs:   . Film/video editor (Medical):   Marland Kitchen Lack of Transportation (Non-Medical):   Physical Activity:   . Days of Exercise per Week:   . Minutes of Exercise per Session:   Stress:   . Feeling of Stress :   Social Connections:   . Frequency of Communication with Friends and Family:   . Frequency of Social Gatherings with Friends and Family:   . Attends Religious Services:   . Active Member of Clubs or Organizations:   . Attends Archivist Meetings:   Marland Kitchen Marital Status:   Intimate Partner Violence:   . Fear of Current or Ex-Partner:   . Emotionally Abused:   Marland Kitchen Physically Abused:   . Sexually Abused:       Review of Systems  All other systems reviewed and are negative.      Objective:   Physical Exam  Constitutional: She appears well-developed.  HENT:  Right Ear: Tympanic membrane and external ear normal.  Left Ear: Tympanic membrane and external ear normal.  Nose: No mucosal edema or rhinorrhea. Right sinus exhibits no maxillary sinus tenderness and no frontal sinus tenderness. Left sinus exhibits no maxillary sinus tenderness and no frontal sinus tenderness.  Mouth/Throat:  Oropharynx is clear and moist. No oropharyngeal exudate, posterior oropharyngeal edema or posterior oropharyngeal erythema.  Neck: No JVD present.  Cardiovascular: Normal rate, regular rhythm and normal heart sounds.  No murmur heard. Pulmonary/Chest: Effort normal and breath sounds normal. No respiratory distress. She has no wheezes. She has no rales. She exhibits no tenderness.  Musculoskeletal:        General: No edema.  Lymphadenopathy:    She has no cervical adenopathy.  Vitals reviewed.         Assessment & Plan:  .Type 2 diabetes mellitus without complication, without long-term current use of insulin (HCC) - Plan: Hemoglobin A1c, CBC with Differential/Platelet, COMPLETE METABOLIC PANEL WITH GFR, Lipid panel  Mixed hyperlipidemia  Morbid obesity (HCC)  Nonischemic cardiomyopathy ([place]) I see no reason the patient cannot proceed with her upcoming knee replacement.  I will check a CBC to evaluate for anemia as well as a CMP to evaluate for any significant liver or kidney abnormalities that would prevent surgery.  I will also obtain a fasting lipid panel.  Her goal LDL cholesterol is less than 100.  She is currently on rosuvastatin and denying any myalgias or right upper quadrant pain.  Her blood pressure today is well controlled.  I will check a hemoglobin A1c.  Her goal hemoglobin A1c is less than 6.5.  Patient would benefit from taking an ACE inhibitor or an ARB if her blood pressure can tolerate it.  Await to see the results of her lab work prior to instituting this medication.

## 2019-06-04 NOTE — Telephone Encounter (Signed)
   Primary Cardiologist: Fransico Him, MD  Chart reviewed as part of pre-operative protocol coverage. Given past medical history and time since last visit, based on ACC/AHA guidelines, [name] would be at acceptable risk for the planned procedure without further cardiovascular testing.   She may hold her aspirin 7 days prior to her surgery and resume as soon as hemostasis is achieved.  I will route this recommendation to the requesting party via Epic fax function and remove from pre-op pool.  Please call with questions.  Jossie Ng. [facility] [address] Office [phone] Fax [phone]

## 2019-06-05 LAB — LIPID PANEL
Cholesterol: 152 mg/dL (ref <200)
HDL: 53 mg/dL (ref >=50)
LDL Cholesterol (Calc): 83 mg/dL (calc)
Non-HDL Cholesterol (Calc): 99 mg/dL (calc) (ref <130)
Total CHOL/HDL Ratio: 2.9 (calc) (ref <5.0)
Triglycerides: 80 mg/dL (ref <150)

## 2019-06-05 LAB — CBC WITH DIFFERENTIAL/PLATELET
Absolute Monocytes: 570 {cells}/uL (ref 200–950)
Basophils Absolute: 30 {cells}/uL (ref 0–200)
Basophils Relative: 0.4 %
Eosinophils Absolute: 163 {cells}/uL (ref 15–500)
Eosinophils Relative: 2.2 %
HCT: 38.1 % (ref 35.0–45.0)
Hemoglobin: 12.6 g/dL (ref 11.7–15.5)
Lymphs Abs: 2583 {cells}/uL (ref 850–3900)
MCH: 31 pg (ref 27.0–33.0)
MCHC: 33.1 g/dL (ref 32.0–36.0)
MCV: 93.8 fL (ref 80.0–100.0)
MPV: 10.4 fL (ref 7.5–12.5)
Monocytes Relative: 7.7 %
Neutro Abs: 4055 {cells}/uL (ref 1500–7800)
Neutrophils Relative %: 54.8 %
Platelets: 328 Thousand/uL (ref 140–400)
RBC: 4.06 Million/uL (ref 3.80–5.10)
RDW: 13.1 % (ref 11.0–15.0)
Total Lymphocyte: 34.9 %
WBC: 7.4 Thousand/uL (ref 3.8–10.8)

## 2019-06-05 LAB — COMPLETE METABOLIC PANEL WITHOUT GFR
AG Ratio: 1.7 (calc) (ref 1.0–2.5)
ALT: 20 U/L (ref 6–29)
AST: 16 U/L (ref 10–35)
Albumin: 4 g/dL (ref 3.6–5.1)
Alkaline phosphatase (APISO): 57 U/L (ref 37–153)
BUN: 12 mg/dL (ref 7–25)
CO2: 27 mmol/L (ref 20–32)
Calcium: 9.4 mg/dL (ref 8.6–10.4)
Chloride: 108 mmol/L (ref 98–110)
Creat: 0.68 mg/dL (ref 0.50–0.99)
GFR, Est African American: 107 mL/min/1.73m2
GFR, Est Non African American: 92 mL/min/1.73m2
Globulin: 2.4 g/dL (ref 1.9–3.7)
Glucose, Bld: 109 mg/dL — ABNORMAL HIGH (ref 65–99)
Potassium: 4.1 mmol/L (ref 3.5–5.3)
Sodium: 142 mmol/L (ref 135–146)
Total Bilirubin: 0.5 mg/dL (ref 0.2–1.2)
Total Protein: 6.4 g/dL (ref 6.1–8.1)

## 2019-06-05 LAB — HEMOGLOBIN A1C
Hgb A1c MFr Bld: 6.7 % of total Hgb — ABNORMAL HIGH (ref <5.7)
Mean Plasma Glucose: 146 (calc)
eAG (mmol/L): 8.1 (calc)

## 2019-06-06 ENCOUNTER — Other Ambulatory Visit: Payer: Self-pay | Admitting: Family Medicine

## 2019-06-06 MED ORDER — EMPAGLIFLOZIN 25 MG PO TABS: 25.0000 mg | ORAL_TABLET | Freq: Every day | ORAL | 3 refills | Status: DC

## 2019-06-13 ENCOUNTER — Telehealth: Payer: Self-pay | Admitting: *Deleted

## 2019-06-13 NOTE — Telephone Encounter (Signed)
Received request from pharmacy for [facility] [place] on Jardiance.   PA submitted. Dx: E11.9- DM.  Per PCP, patient has CHF with ejection fraction of 40%. Jardiance has 30% mortality reduction and is therefore medically neccessary.

## 2019-06-14 MED ORDER — EMPAGLIFLOZIN 25 MG PO TABS: 25.0000 mg | ORAL_TABLET | Freq: Every day | ORAL | 3 refills | Status: DC

## 2019-06-14 NOTE — Telephone Encounter (Signed)
Received PA determination.   PA approved 05/14/2019- 06/12/2020.  Pharmacy made aware.

## 2019-06-27 ENCOUNTER — Ambulatory Visit: Payer: Managed Care, Other (non HMO) | Admitting: Specialist

## 2019-07-04 ENCOUNTER — Other Ambulatory Visit: Payer: Self-pay

## 2019-07-04 ENCOUNTER — Encounter: Payer: Self-pay | Admitting: Surgery

## 2019-07-04 ENCOUNTER — Ambulatory Visit: Payer: Managed Care, Other (non HMO) | Admitting: Surgery

## 2019-07-04 VITALS — BP 116/70 | HR 74 | Ht 64.05 in | Wt 258.0 lb

## 2019-07-04 DIAGNOSIS — M1712 Unilateral primary osteoarthritis, left knee: Secondary | ICD-10-CM

## 2019-07-04 NOTE — Progress Notes (Signed)
64 year old black female history of end-stage DJD left knee and pain returns.  My last office visit with patient I advised her that definitive treatment for knee would be total knee replacement.  She was supposed to get preop clearances and then follow-up with Dr. Louanne Skye to review those clearances and discuss timing of surgery.  She states that she is wanting to wait a little while longer to have the surgery.  She asked about getting a cortisone injection today but states that her knee is not too bad right now.  Plan Advised patient that if her knee is tolerable at this point that I would hold off on repeating cortisone injection.  She will follow-up in the office with Dr. Louanne Skye in 3 months for recheck.  If she would like to proceed with scheduling surgery before that time she will let me know and I can have her come in sooner to see him.  All questions answered.

## 2019-07-12 ENCOUNTER — Other Ambulatory Visit: Payer: Self-pay | Admitting: Family Medicine

## 2019-07-31 ENCOUNTER — Telehealth: Payer: Self-pay | Admitting: Family Medicine

## 2019-07-31 NOTE — Telephone Encounter (Signed)
CB# [phone] Pt stop taking Jardiance for itching still having problems with her vaginal also anal area itching real bad is another medication or antibody be call in the pharmacy [facility] that's on file please call before 2 pm

## 2019-08-01 ENCOUNTER — Other Ambulatory Visit: Payer: Self-pay | Admitting: Family Medicine

## 2019-08-01 DIAGNOSIS — Z0289 Encounter for other administrative examinations: Secondary | ICD-10-CM

## 2019-08-01 MED ORDER — FLUCONAZOLE 150 MG PO TABS: 150.0000 mg | ORAL_TABLET | Freq: Once | ORAL | 0 refills | Status: AC

## 2019-08-01 MED ORDER — SITAGLIPTIN PHOSPHATE 100 MG PO TABS: 100.0000 mg | ORAL_TABLET | Freq: Every day | ORAL | 3 refills | Status: DC

## 2019-08-01 NOTE — Telephone Encounter (Signed)
Spike with patient and informed her that we are calling in Januvia in place of the jardiance. Patient verbalized understanding.

## 2019-08-01 NOTE — Telephone Encounter (Signed)
I will send in diflucan 150 po x 1.

## 2019-08-01 NOTE — Telephone Encounter (Signed)
Spoke with patient and informed her that Diflucan was called into the pharmacy. Patient verbalized understanding and asked if you were replacing the jardiance since she has stopped it? Please advise?

## 2019-08-01 NOTE — Telephone Encounter (Signed)
Can replace with januvia 100 poqday

## 2019-08-08 ENCOUNTER — Encounter: Payer: Self-pay | Admitting: Family Medicine

## 2019-08-08 ENCOUNTER — Telehealth: Payer: Self-pay | Admitting: Family Medicine

## 2019-08-08 ENCOUNTER — Other Ambulatory Visit: Payer: Self-pay

## 2019-08-08 ENCOUNTER — Ambulatory Visit: Payer: Managed Care, Other (non HMO) | Admitting: Family Medicine

## 2019-08-08 VITALS — BP 115/70 | HR 70 | Temp 96.3°F | Ht 64.05 in | Wt 246.0 lb

## 2019-08-08 DIAGNOSIS — N760 Acute vaginitis: Secondary | ICD-10-CM | POA: Diagnosis not present

## 2019-08-08 LAB — WET PREP FOR TRICH, YEAST, CLUE

## 2019-08-08 MED ORDER — FLUCONAZOLE 150 MG PO TABS: 150.0000 mg | ORAL_TABLET | ORAL | 0 refills | Status: DC

## 2019-08-08 MED ORDER — CLOTRIMAZOLE 1 % EX CREA: 1.0000 "application " | TOPICAL_CREAM | Freq: Three times a day (TID) | CUTANEOUS | 0 refills | Status: DC

## 2019-08-08 NOTE — Progress Notes (Signed)
Subjective:    Patient ID: [name], female    DOB: 02/15/1955, 64 y.o.   MRN: [id number]  HPI  Patient has a history of diabetes.  She was recently started on Jardiance due to her history of cardiomyopathy.  Recently she developed itching and burning in her perineum.  I recommended that she stop Jardiance immediately and treated her with Diflucan 150 mg p.o. x1 empirically for possible yeast.  She states that the Diflucan helped 50 or 60% however she continues to have severe itching and burning on the labia majora bilaterally and also on the intertriginous skin folds and on her medial thighs.  Exam was performed today with a chaperone present.  She has numerous erythematous papules coalescing into large plaques and patches on either labia majora.  There is also light pink erythema extending into the intertriginous folds bilaterally with sharp well-circumscribed borders consistent with Candida intertrigo.  There is also some erythema extending onto the medial thighs bilaterally. Past Medical History:  Diagnosis Date  . Anxiety   . Aortic stenosis, mild    echo 2017  . Breast cancer ([place])    left breast  . Carpal tunnel syndrome   . Cataract   . Chronic diastolic CHF (congestive heart failure) (HCC)    NYHA class II  . Hyperlipidemia   . Hypertension   . Left bundle branch block (LBBB)   . Low back pain   . Neuropathy   . NICM (nonischemic cardiomyopathy) ([place])    EF 30%, cath 05/20/2015 clean coronary.  EF resolved by echo with EF 55%  . OSA on CPAP   . Prediabetes   . Pulmonary HTN ([place]) 05/2015   Severe with PASP 67/32mmHg - resolved on followup echo  . PVC's (premature ventricular contractions) 11/04/2015   Past Surgical History:  Procedure Laterality Date  . CARDIAC CATHETERIZATION N/A 05/20/2015   Procedure: Right/Left Heart Cath and Coronary Angiography;  Surgeon: Troy Sine, MD;  Location: [facility];  Service: Cardiovascular;  Laterality: N/A;  . CATARACT  EXTRACTION W/ INTRAOCULAR LENS  IMPLANT, BILATERAL    . DILATION AND CURETTAGE OF UTERUS    . lumpectomy left breast    . UTERINE FIBROID SURGERY     Current Outpatient Medications on File Prior to Visit  Medication Sig Dispense Refill  . acetaminophen (TYLENOL 8 HOUR ARTHRITIS PAIN) 650 MG CR tablet Take 1,300 mg by mouth as needed for pain.    Marland Kitchen aspirin 81 MG tablet Take 81 mg by mouth daily.    . carvedilol (COREG) 6.25 MG tablet TAKE 1 TABLET BY MOUTH TWICE DAILY WITH A MEAL 180 tablet 2  . cholecalciferol (VITAMIN D) 1000 units tablet Take 1,000 Units by mouth daily.    Marland Kitchen ENTRESTO 24-26 MG Take 1 tablet by mouth twice daily 180 tablet 3  . ezetimibe (ZETIA) 10 MG tablet Take 1 tablet by mouth once daily 30 tablet 10  . furosemide (LASIX) 40 MG tablet TAKE ONE TO TWO TABLETS BY MOUTH DAILY AS DIRECTED 180 tablet 3  . gabapentin (NEURONTIN) 100 MG capsule Take 1 capsule (100 mg total) by mouth 2 (two) times daily. 60 capsule 0  . Melatonin 10 MG TABS Take by mouth at bedtime.     . mometasone (NASONEX) 50 MCG/ACT nasal spray Place 2 sprays into the nose daily. 17 g 12  . Multiple Vitamin (MULTIVITAMIN) tablet Take 1 tablet by mouth daily.    . rosuvastatin (CRESTOR) 5 MG tablet  Take 1 tablet by mouth once daily 90 tablet 1  . spironolactone (ALDACTONE) 25 MG tablet Take 1/2 (one-half) tablet by mouth once daily 45 tablet 3  . vitamin C (ASCORBIC ACID) 500 MG tablet Take 2,000 mg by mouth as needed.     . sitaGLIPtin (JANUVIA) 100 MG tablet Take 1 tablet (100 mg total) by mouth daily. (Patient not taking: Reported on 08/08/2019) 30 tablet 3   No current facility-administered medications on file prior to visit.   Allergies  Allergen Reactions  . Atorvastatin Other (See Comments)    REACTION: mylagias  . Pravastatin Sodium Other (See Comments)    REACTION: myalgias  . Sulfa Antibiotics Hives   Social History   Socioeconomic History  . Marital status: Significant Other    Spouse  name: Not on file  . Number of children: Not on file  . Years of education: Not on file  . Highest education level: Not on file  Occupational History  . Occupation: Truck Geophysicist/field seismologist  Tobacco Use  . Smoking status: Former Smoker    Quit date: 06/18/2009    Years since quitting: 10.1  . Smokeless tobacco: Never Used  Substance and Sexual Activity  . Alcohol use: Yes    Comment: once every 2 months  . Drug use: No  . Sexual activity: Yes  Other Topics Concern  . Not on file  Social History Narrative  . Not on file   Social Determinants of Health   Financial Resource Strain:   . Difficulty of Paying Living Expenses:   Food Insecurity:   . Worried About Charity fundraiser in the Last Year:   . Arboriculturist in the Last Year:   Transportation Needs:   . Film/video editor (Medical):   Marland Kitchen Lack of Transportation (Non-Medical):   Physical Activity:   . Days of Exercise per Week:   . Minutes of Exercise per Session:   Stress:   . Feeling of Stress :   Social Connections:   . Frequency of Communication with Friends and Family:   . Frequency of Social Gatherings with Friends and Family:   . Attends Religious Services:   . Active Member of Clubs or Organizations:   . Attends Archivist Meetings:   Marland Kitchen Marital Status:   Intimate Partner Violence:   . Fear of Current or Ex-Partner:   . Emotionally Abused:   Marland Kitchen Physically Abused:   . Sexually Abused:       Review of Systems  All other systems reviewed and are negative.      Objective:   Physical Exam Vitals reviewed.  Constitutional:      Appearance: She is well-developed.  HENT:     Nose: No mucosal edema or rhinorrhea.     Right Sinus: No maxillary sinus tenderness or frontal sinus tenderness.     Left Sinus: No maxillary sinus tenderness or frontal sinus tenderness.     Mouth/Throat:     Pharynx: No oropharyngeal exudate or posterior oropharyngeal erythema.  Neck:     Vascular: No JVD.  Cardiovascular:       Rate and Rhythm: Normal rate and regular rhythm.     Heart sounds: Normal heart sounds. No murmur heard.   Pulmonary:     Effort: Pulmonary effort is normal. No respiratory distress.     Breath sounds: Normal breath sounds. No wheezing or rales.  Chest:     Chest wall: No tenderness.  Genitourinary:    Pubic  Area: Rash present.     Labia:        Right: Rash and tenderness present.        Left: Rash and tenderness present.     Lymphadenopathy:     Cervical: No cervical adenopathy.           Assessment & Plan:  Acute vaginitis - Plan: WET PREP FOR TRICH, YEAST, CLUE  Vulvovaginitis  Wet prep does not show any yeast however patient clearly has vulvar involvement.  I will treat the patient with Diflucan 150 mg p.o. every 72 hours for 3 doses.  I will also give her Lotrimin cream to apply 2-3 times a day for 1 week.  Reassess in 1 week if no better or sooner if worse.

## 2019-08-08 NOTE — Telephone Encounter (Signed)
Patient is requesting a cream called into [facility] on [address] for her private area. She states that she took the diflucan and that her private area is still red and itching from the jardiance.    Patient has scheduled an appointment for this afternoon at 3 to discuss concerns she has.   CB# [phone]

## 2019-08-08 NOTE — Telephone Encounter (Signed)
Pt has appointment with a provider.

## 2019-08-12 ENCOUNTER — Telehealth: Payer: Self-pay | Admitting: Cardiology

## 2019-08-12 DIAGNOSIS — I5042 Chronic combined systolic (congestive) and diastolic (congestive) heart failure: Secondary | ICD-10-CM

## 2019-08-12 NOTE — Telephone Encounter (Signed)
Spoke with patient and scheduled her for an echocardiogram.

## 2019-08-12 NOTE — Telephone Encounter (Signed)
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 She will need an order please.t

## 2019-08-12 NOTE — Telephone Encounter (Signed)
New Message:                                  New Message:

## 2019-08-13 ENCOUNTER — Telehealth: Payer: Self-pay | Admitting: Family Medicine

## 2019-08-13 NOTE — Telephone Encounter (Signed)
CB# [phone] She finish Diflucan wasn't sure what's next still having issue.

## 2019-08-20 NOTE — Telephone Encounter (Signed)
Pt has appt 7/9 to come in for cpe

## 2019-08-23 ENCOUNTER — Ambulatory Visit (INDEPENDENT_AMBULATORY_CARE_PROVIDER_SITE_OTHER): Payer: Managed Care, Other (non HMO) | Admitting: Family Medicine

## 2019-08-23 ENCOUNTER — Other Ambulatory Visit: Payer: Self-pay

## 2019-08-23 VITALS — BP 120/70 | HR 72 | Temp 97.4°F | Ht 64.0 in | Wt 244.0 lb

## 2019-08-23 DIAGNOSIS — Z0001 Encounter for general adult medical examination with abnormal findings: Secondary | ICD-10-CM

## 2019-08-23 DIAGNOSIS — Z0000 Encounter for general adult medical examination without abnormal findings: Secondary | ICD-10-CM

## 2019-08-23 DIAGNOSIS — Z124 Encounter for screening for malignant neoplasm of cervix: Secondary | ICD-10-CM

## 2019-08-23 DIAGNOSIS — I428 Other cardiomyopathies: Secondary | ICD-10-CM

## 2019-08-23 DIAGNOSIS — I110 Hypertensive heart disease with heart failure: Secondary | ICD-10-CM

## 2019-08-23 DIAGNOSIS — E782 Mixed hyperlipidemia: Secondary | ICD-10-CM | POA: Diagnosis not present

## 2019-08-23 DIAGNOSIS — I5032 Chronic diastolic (congestive) heart failure: Secondary | ICD-10-CM | POA: Diagnosis not present

## 2019-08-23 DIAGNOSIS — E1169 Type 2 diabetes mellitus with other specified complication: Secondary | ICD-10-CM

## 2019-08-23 MED ORDER — NYSTATIN-TRIAMCINOLONE 100000-0.1 UNIT/GM-% EX OINT
1.0000 | TOPICAL_OINTMENT | Freq: Three times a day (TID) | CUTANEOUS | 1 refills | Status: DC
Start: 2019-08-23 — End: 2020-04-15

## 2019-08-23 NOTE — Progress Notes (Signed)
Subjective:    Patient ID: [name], female    DOB: Feb 06, 1956, 64 y.o.   MRN: [id number]  HPI  Patient is a very pleasant 64 year old African-American female who is here today for physical exam. Her last colonoscopy was in 2018. She is due again. She does have a history of breast cancer status post surgical resection in her left breast.  Patient Is due for mammogram but she would like to schedule this on her own.  Pap smear is due. Past medical history includes hypertension, hyperlipidemia, obstructive sleep apnea, and mildly suppressed ejection fraction of 45% due to left bundle branch block and hypertensive cardiomyopathy.  Due to her history of cardiomyopathy, I had started the patient initially on Jardiance earlier this year.  The patient suffered a yeast infection from the [place].  Please see her last office visit.  It has improved dramatically after taking a prolonged course of Diflucan however she continues to have mild burning and erythema along the labia majora as well as in the intertriginous folds secondary to her abdominal pannus as well as skin folds in her medial thighs and in the crease between her pelvis and thigh.  Patient request a referral to see an endocrinologist to manage her diabetes.  She never started the Januvia that I replaced the Jardiance with. Past Medical History:  Diagnosis Date  . Anxiety   . Aortic stenosis, mild    echo 2017  . Breast cancer ([place])    left breast  . Carpal tunnel syndrome   . Cataract   . Chronic diastolic CHF (congestive heart failure) (HCC)    NYHA class II  . Hyperlipidemia   . Hypertension   . Left bundle branch block (LBBB)   . Low back pain   . Neuropathy   . NICM (nonischemic cardiomyopathy) ([place])    EF 30%, cath 05/20/2015 clean coronary.  EF resolved by echo with EF 55%  . OSA on CPAP   . Prediabetes   . Pulmonary HTN ([place]) 05/2015   Severe with PASP 67/41mmHg - resolved on followup echo  . PVC's (premature ventricular  contractions) 11/04/2015   Past Surgical History:  Procedure Laterality Date  . CARDIAC CATHETERIZATION N/A 05/20/2015   Procedure: Right/Left Heart Cath and Coronary Angiography;  Surgeon: Troy Sine, MD;  Location: [facility];  Service: Cardiovascular;  Laterality: N/A;  . CATARACT EXTRACTION W/ INTRAOCULAR LENS  IMPLANT, BILATERAL    . DILATION AND CURETTAGE OF UTERUS    . lumpectomy left breast    . UTERINE FIBROID SURGERY     Current Outpatient Medications on File Prior to Visit  Medication Sig Dispense Refill  . acetaminophen (TYLENOL 8 HOUR ARTHRITIS PAIN) 650 MG CR tablet Take 1,300 mg by mouth as needed for pain.    Marland Kitchen aspirin 81 MG tablet Take 81 mg by mouth daily.    . carvedilol (COREG) 6.25 MG tablet TAKE 1 TABLET BY MOUTH TWICE DAILY WITH A MEAL 180 tablet 2  . cholecalciferol (VITAMIN D) 1000 units tablet Take 1,000 Units by mouth daily.    . clotrimazole (LOTRIMIN AF) 1 % cream Apply 1 application topically in the morning, at noon, and at bedtime. 60 g 0  . ENTRESTO 24-26 MG Take 1 tablet by mouth twice daily 180 tablet 3  . ezetimibe (ZETIA) 10 MG tablet Take 1 tablet by mouth once daily 30 tablet 10  . fluconazole (DIFLUCAN) 150 MG tablet Take 1 tablet (150 mg total) by  mouth every 3 (three) days. 4 tablet 0  . furosemide (LASIX) 40 MG tablet TAKE ONE TO TWO TABLETS BY MOUTH DAILY AS DIRECTED 180 tablet 3  . gabapentin (NEURONTIN) 100 MG capsule Take 1 capsule (100 mg total) by mouth 2 (two) times daily. 60 capsule 0  . Melatonin 10 MG TABS Take by mouth at bedtime.     . mometasone (NASONEX) 50 MCG/ACT nasal spray Place 2 sprays into the nose daily. 17 g 12  . Multiple Vitamin (MULTIVITAMIN) tablet Take 1 tablet by mouth daily.    . rosuvastatin (CRESTOR) 5 MG tablet Take 1 tablet by mouth once daily 90 tablet 1  . sitaGLIPtin (JANUVIA) 100 MG tablet Take 1 tablet (100 mg total) by mouth daily. 30 tablet 3  . spironolactone (ALDACTONE) 25 MG tablet Take 1/2  (one-half) tablet by mouth once daily 45 tablet 3  . vitamin C (ASCORBIC ACID) 500 MG tablet Take 2,000 mg by mouth as needed.      No current facility-administered medications on file prior to visit.   Allergies  Allergen Reactions  . Atorvastatin Other (See Comments)    REACTION: mylagias  . Pravastatin Sodium Other (See Comments)    REACTION: myalgias  . Sulfa Antibiotics Hives   Social History   Socioeconomic History  . Marital status: Significant Other    Spouse name: Not on file  . Number of children: Not on file  . Years of education: Not on file  . Highest education level: Not on file  Occupational History  . Occupation: Truck Geophysicist/field seismologist  Tobacco Use  . Smoking status: Former Smoker    Quit date: 06/18/2009    Years since quitting: 10.1  . Smokeless tobacco: Never Used  Substance and Sexual Activity  . Alcohol use: Yes    Comment: once every 2 months  . Drug use: No  . Sexual activity: Yes  Other Topics Concern  . Not on file  Social History Narrative  . Not on file   Social Determinants of Health   Financial Resource Strain:   . Difficulty of Paying Living Expenses:   Food Insecurity:   . Worried About Charity fundraiser in the Last Year:   . Arboriculturist in the Last Year:   Transportation Needs:   . Film/video editor (Medical):   Marland Kitchen Lack of Transportation (Non-Medical):   Physical Activity:   . Days of Exercise per Week:   . Minutes of Exercise per Session:   Stress:   . Feeling of Stress :   Social Connections:   . Frequency of Communication with Friends and Family:   . Frequency of Social Gatherings with Friends and Family:   . Attends Religious Services:   . Active Member of Clubs or Organizations:   . Attends Archivist Meetings:   Marland Kitchen Marital Status:   Intimate Partner Violence:   . Fear of Current or Ex-Partner:   . Emotionally Abused:   Marland Kitchen Physically Abused:   . Sexually Abused:    Family History  Problem Relation Age of  Onset  . Arthritis Mother   . Depression Mother   . Hearing loss Mother   . Hyperlipidemia Mother   . Miscarriages / [place] Mother   . Colon cancer Mother 71       2010  . Alcohol abuse Father   . Cancer Father        PROSTATE  . Depression Father   . Hypertension Father   .  Hyperlipidemia Father   . Drug abuse Brother   . Early death Maternal Grandmother   . Cancer Paternal Grandmother   . Heart attack Neg Hx      Review of Systems  All other systems reviewed and are negative.      Objective:   Physical Exam Vitals reviewed. Exam conducted with a chaperone present.  Constitutional:      General: She is not in acute distress.    Appearance: She is well-developed. She is not diaphoretic.  HENT:     Head: Normocephalic and atraumatic.     Right Ear: External ear normal.     Left Ear: External ear normal.     Nose: Nose normal.     Mouth/Throat:     Pharynx: No oropharyngeal exudate.  Eyes:     General: No scleral icterus.       Right eye: No discharge.        Left eye: No discharge.     Conjunctiva/sclera: Conjunctivae normal.     Pupils: Pupils are equal, round, and reactive to light.  Neck:     Thyroid: No thyromegaly.     Vascular: No JVD.     Trachea: No tracheal deviation.  Cardiovascular:     Rate and Rhythm: Normal rate and regular rhythm.     Heart sounds: Normal heart sounds. No murmur heard.  No friction rub. No gallop.   Pulmonary:     Effort: Pulmonary effort is normal. No respiratory distress.     Breath sounds: Normal breath sounds. No stridor. No wheezing or rales.  Chest:     Chest wall: No tenderness.  Abdominal:     General: Bowel sounds are normal. There is no distension.     Palpations: Abdomen is soft. There is no mass.     Tenderness: There is no abdominal tenderness. There is no guarding or rebound.  Genitourinary:    Labia:        Right: Rash present.        Left: Rash present.      Vagina: Normal. No vaginal discharge.      Cervix: No cervical motion tenderness, friability or erythema.    Musculoskeletal:        General: No tenderness. Normal range of motion.     Cervical back: Normal range of motion and neck supple.  Lymphadenopathy:     Cervical: No cervical adenopathy.  Skin:    General: Skin is warm.     Coloration: Skin is not pale.     Findings: No erythema or rash.  Neurological:     Mental Status: She is alert and oriented to person, place, and time.     Cranial Nerves: No cranial nerve deficit.     Motor: No abnormal muscle tone.     Coordination: Coordination normal.     Deep Tendon Reflexes: Reflexes are normal and symmetric.  Psychiatric:        Behavior: Behavior normal.        Thought Content: Thought content normal.        Judgment: Judgment normal.           Assessment & Plan:  Mixed hyperlipidemia  Hypertensive heart disease with congestive heart failure, unspecified heart failure type (HCC)  Chronic diastolic heart failure (HCC)  Nonischemic cardiomyopathy ([place])  Morbid obesity ([place])  General medical exam  Type 2 diabetes mellitus with other specified complication, without long-term current use of insulin ([place]) - Plan: Hemoglobin A1c, CBC  with Differential/Platelet, COMPLETE METABOLIC PANEL WITH GFR, Lipid panel, Microalbumin, urine  Cervical cancer screening - Plan: PAP, Thin Prep w/HPV rflx HPV Type 16/18  I believe the patient is primarily suffering from intertrigo due to sweat and heat and moisture.  I will start the patient on nystatin/triamcinolone 2-3 times a day in the intertriginous folds and along the labia to calm the irritation and help with the itching.  The infection itself seems to be much better.  She would like a referral to an endocrinologist.  Her A1c was 6.7.  She was started on Jardiance mainly for the reduced risk of cardiovascular death.  I have recommended trying Januvia or Trulicity however patient would like to discuss this with an  endocrinologist.  Blood pressure today is adequately controlled.  Patient elects to schedule her own mammogram.  Patient elects to schedule her own colonoscopy.  Pap smear was sent to pathology in a labeled container.  I will check fasting lab work.

## 2019-08-24 LAB — CBC WITH DIFFERENTIAL/PLATELET
Absolute Monocytes: 531 cells/uL (ref 200–950)
Basophils Absolute: 26 cells/uL (ref 0–200)
Basophils Relative: 0.3 %
Eosinophils Absolute: 183 cells/uL (ref 15–500)
Eosinophils Relative: 2.1 %
HCT: 41.2 % (ref 35.0–45.0)
Hemoglobin: 14.3 g/dL (ref 11.7–15.5)
Lymphs Abs: 2793 cells/uL (ref 850–3900)
MCH: 32 pg (ref 27.0–33.0)
MCHC: 34.7 g/dL (ref 32.0–36.0)
MCV: 92.2 fL (ref 80.0–100.0)
MPV: 10.4 fL (ref 7.5–12.5)
Monocytes Relative: 6.1 %
Neutro Abs: 5168 cells/uL (ref 1500–7800)
Neutrophils Relative %: 59.4 %
Platelets: 361 10*3/uL (ref 140–400)
RBC: 4.47 10*6/uL (ref 3.80–5.10)
RDW: 13.1 % (ref 11.0–15.0)
Total Lymphocyte: 32.1 %
WBC: 8.7 10*3/uL (ref 3.8–10.8)

## 2019-08-24 LAB — COMPLETE METABOLIC PANEL WITH GFR
AG Ratio: 1.7 (calc) (ref 1.0–2.5)
ALT: 21 U/L (ref 6–29)
AST: 14 U/L (ref 10–35)
Albumin: 4.3 g/dL (ref 3.6–5.1)
Alkaline phosphatase (APISO): 65 U/L (ref 37–153)
BUN: 14 mg/dL (ref 7–25)
CO2: 25 mmol/L (ref 20–32)
Calcium: 10.1 mg/dL (ref 8.6–10.4)
Chloride: 105 mmol/L (ref 98–110)
Creat: 0.66 mg/dL (ref 0.50–0.99)
GFR, Est African American: 108 mL/min/{1.73_m2} (ref >=60)
GFR, Est Non African American: 93 mL/min/{1.73_m2} (ref >=60)
Globulin: 2.5 g/dL (calc) (ref 1.9–3.7)
Glucose, Bld: 121 mg/dL — ABNORMAL HIGH (ref 65–99)
Potassium: 4.1 mmol/L (ref 3.5–5.3)
Sodium: 140 mmol/L (ref 135–146)
Total Bilirubin: 0.3 mg/dL (ref 0.2–1.2)
Total Protein: 6.8 g/dL (ref 6.1–8.1)

## 2019-08-24 LAB — LIPID PANEL
Cholesterol: 156 mg/dL (ref <200)
HDL: 59 mg/dL (ref >=50)
LDL Cholesterol (Calc): 79 mg/dL (calc)
Non-HDL Cholesterol (Calc): 97 mg/dL (calc) (ref <130)
Total CHOL/HDL Ratio: 2.6 (calc) (ref <5.0)
Triglycerides: 99 mg/dL (ref <150)

## 2019-08-24 LAB — MICROALBUMIN, URINE: Microalb, Ur: 0.8 mg/dL

## 2019-08-24 LAB — HEMOGLOBIN A1C
Hgb A1c MFr Bld: 6.4 % of total Hgb — ABNORMAL HIGH (ref <5.7)
Mean Plasma Glucose: 137 (calc)
eAG (mmol/L): 7.6 (calc)

## 2019-08-26 ENCOUNTER — Other Ambulatory Visit: Payer: Self-pay | Admitting: Family Medicine

## 2019-08-27 LAB — PAP, TP IMAGING W/ HPV RNA, RFLX HPV TYPE 16,18/45: HPV DNA High Risk: NOT DETECTED

## 2019-08-29 ENCOUNTER — Other Ambulatory Visit: Payer: Self-pay

## 2019-08-29 MED ORDER — EZETIMIBE 10 MG PO TABS: 10.0000 mg | ORAL_TABLET | Freq: Every day | ORAL | 8 refills | Status: DC

## 2019-09-04 ENCOUNTER — Encounter: Payer: Self-pay | Admitting: Family Medicine

## 2019-09-04 ENCOUNTER — Ambulatory Visit (HOSPITAL_COMMUNITY): Payer: Managed Care, Other (non HMO) | Attending: Cardiovascular Disease

## 2019-09-04 ENCOUNTER — Other Ambulatory Visit: Payer: Self-pay

## 2019-09-04 DIAGNOSIS — I5042 Chronic combined systolic (congestive) and diastolic (congestive) heart failure: Secondary | ICD-10-CM | POA: Diagnosis not present

## 2019-09-04 LAB — ECHOCARDIOGRAM COMPLETE
AR max vel: 1.41 cm2
AV Area VTI: 1.24 cm2
AV Area mean vel: 1.35 cm2
AV Mean grad: 14 mmHg
AV Peak grad: 23 mmHg
Ao pk vel: 2.4 m/s
Area-P 1/2: 2.76 cm2
Calc EF: 45.3 %
S' Lateral: 4 cm
Single Plane A2C EF: 43.2 %
Single Plane A4C EF: 45.9 %

## 2019-09-04 MED ORDER — PERFLUTREN LIPID MICROSPHERE
1.0000 mL | INTRAVENOUS | Status: AC | PRN
  Administered 2019-09-04: 1 mL via INTRAVENOUS

## 2019-09-05 ENCOUNTER — Telehealth: Payer: Self-pay

## 2019-09-05 DIAGNOSIS — I350 Nonrheumatic aortic (valve) stenosis: Secondary | ICD-10-CM

## 2019-09-05 NOTE — Telephone Encounter (Signed)
-----   Message from Sueanne Margarita, MD sent at 09/04/2019  1:06 PM EDT ----- Echo showed mildly reduced LVF with increased stiffness of heart muscle called diastolic dysfuction.  Mild AS.  No change from prior study. Repeat in 1 year

## 2019-09-05 NOTE — Telephone Encounter (Signed)
The patient has been notified of the result and verbalized understanding.  All questions (if any) were answered. Antonieta Iba, RN 09/05/2019 1:21 PM

## 2019-09-10 ENCOUNTER — Ambulatory Visit: Payer: Managed Care, Other (non HMO) | Admitting: Endocrinology

## 2019-09-23 NOTE — Progress Notes (Signed)
Patient ID: [name], female   DOB: 12/03/1955, 64 y.o.   MRN: [id number]           Reason for Appointment: Consultation for Type 2 Diabetes  Referring PCP: Dr. Dennard Schaumann   History of Present Illness:          Date of diagnosis of type 2 diabetes mellitus: 09/2017       Background history:   Her diabetes was diagnosed with an A1c of 6.8 Review of her records indicate that probably that her highest fasting blood sugar has been only 130 outside the hospital settings  Recent history:   Most recent A1c is 6.4 done on 08/23/2019  Non-insulin hypoglycemic drugs the patient is taking are: None  Current management, blood sugar patterns and problems identified:  She was given a trial of Jardiance  in April 2021 when her A1c was 6.7  Serum glucose was 109 at that time but she was told this was to help her heart function   However because of severe vaginitis she stopped this in June  She was also tried on Januvia but did not take this for long  She wants to know whether she has diabetes and what she needs to do about this  She has difficulty losing weight, unable to exercise because of severe knee pain  Also does not have any knowledge of diabetic meal planning, no previous dietitian visits  Because of her work she will frequently eat fast food.  She also will regularly have sweet tea and regular soft drinks like Sprite  She has not measured her blood sugars at home        Side effects from medications have been: Vaginitis from Jardiance               Exercise:  minimal  Glucose monitoring:  Not done  times a day           Weight history: Highest 262   Wt Readings from Last 3 Encounters:  09/24/19 246 lb 3.2 oz (111.7 kg)  08/23/19 244 lb (110.7 kg)  08/08/19 246 lb (111.6 kg)    Glycemic control:   Lab Results  Component Value Date   HGBA1C 6.4 (H) 08/23/2019   HGBA1C 6.7 (H) 06/04/2019   HGBA1C 6.4 (H) 04/05/2018   Lab Results  Component Value Date    MICROALBUR 0.8 08/23/2019   LDLCALC 79 08/23/2019   CREATININE 0.66 08/23/2019   No results found for: MICRALBCREAT  No results found for: FRUCTOSAMINE    Allergies as of 09/24/2019      Reactions   Jardiance [empagliflozin] Anaphylaxis, Itching, Rash, Other (See Comments)   Yeast infection   Atorvastatin Other (See Comments)   REACTION: mylagias   Pravastatin Sodium Other (See Comments)   REACTION: myalgias   Sulfa Antibiotics Hives      Medication List       Accurate as of September 24, 2019  8:43 PM. If you have any questions, ask your nurse or doctor.        STOP taking these medications   fluconazole 150 MG tablet Commonly known as: DIFLUCAN Stopped by: Elayne Snare, MD   mometasone 50 MCG/ACT nasal spray Commonly known as: NASONEX Stopped by: Elayne Snare, MD     TAKE these medications   aspirin 81 MG tablet Take 81 mg by mouth daily.   carvedilol 6.25 MG tablet Commonly known as: COREG TAKE 1 TABLET BY MOUTH TWICE DAILY WITH A MEAL   cholecalciferol  1000 units tablet Commonly known as: VITAMIN D Take 1,000 Units by mouth daily.   clotrimazole 1 % cream Commonly known as: Lotrimin AF Apply 1 application topically in the morning, at noon, and at bedtime.   Entresto 24-26 MG Generic drug: sacubitril-valsartan Take 1 tablet by mouth twice daily   ezetimibe 10 MG tablet Commonly known as: ZETIA Take 1 tablet (10 mg total) by mouth daily.   furosemide 40 MG tablet Commonly known as: LASIX TAKE ONE TO TWO TABLETS BY MOUTH DAILY AS DIRECTED   gabapentin 100 MG capsule Commonly known as: NEURONTIN Take 1 capsule (100 mg total) by mouth 2 (two) times daily.   Melatonin 10 MG Tabs Take by mouth at bedtime.   multivitamin tablet Take 1 tablet by mouth daily.   nystatin-triamcinolone ointment Commonly known as: MYCOLOG Apply 1 application topically 3 (three) times daily.   rosuvastatin 5 MG tablet Commonly known as: CRESTOR Take 1 tablet by mouth  once daily   spironolactone 25 MG tablet Commonly known as: ALDACTONE Take 1/2 (one-half) tablet by mouth once daily   Tylenol 8 Hour Arthritis Pain 650 MG CR tablet Generic drug: acetaminophen Take 1,300 mg by mouth as needed for pain.   vitamin C 500 MG tablet Commonly known as: ASCORBIC ACID Take 2,000 mg by mouth as needed.       Allergies:  Allergies  Allergen Reactions  . Jardiance [Empagliflozin] Anaphylaxis, Itching, Rash and Other (See Comments)    Yeast infection  . Atorvastatin Other (See Comments)    REACTION: mylagias  . Pravastatin Sodium Other (See Comments)    REACTION: myalgias  . Sulfa Antibiotics Hives    Past Medical History:  Diagnosis Date  . Anxiety   . Aortic stenosis, mild    echo 2017  . Breast cancer ([place])    left breast  . Carpal tunnel syndrome   . Cataract   . Chronic diastolic CHF (congestive heart failure) (HCC)    NYHA class II  . Hyperlipidemia   . Hypertension   . Left bundle branch block (LBBB)   . Low back pain   . Neuropathy   . NICM (nonischemic cardiomyopathy) ([place])    EF 30%, cath 05/20/2015 clean coronary.  EF resolved by echo with EF 55%  . OSA on CPAP   . Prediabetes   . Pulmonary HTN ([place]) 05/2015   Severe with PASP 67/60mmHg - resolved on followup echo  . PVC's (premature ventricular contractions) 11/04/2015    Past Surgical History:  Procedure Laterality Date  . CARDIAC CATHETERIZATION N/A 05/20/2015   Procedure: Right/Left Heart Cath and Coronary Angiography;  Surgeon: Troy Sine, MD;  Location: [facility];  Service: Cardiovascular;  Laterality: N/A;  . CATARACT EXTRACTION W/ INTRAOCULAR LENS  IMPLANT, BILATERAL    . DILATION AND CURETTAGE OF UTERUS    . lumpectomy left breast    . UTERINE FIBROID SURGERY      Family History  Problem Relation Age of Onset  . Arthritis Mother   . Depression Mother   . Hearing loss Mother   . Hyperlipidemia Mother   . Miscarriages / [place] Mother   . Colon  cancer Mother 81       2010  . Alcohol abuse Father   . Cancer Father        PROSTATE  . Depression Father   . Hypertension Father   . Hyperlipidemia Father   . Drug abuse Brother   . Early death Maternal  Grandmother   . Cancer Paternal Grandmother   . Heart attack Neg Hx   . Diabetes Neg Hx     Social History:  reports that she quit smoking about 10 years ago. She has never used smokeless tobacco. She reports current alcohol use. She reports that she does not use drugs.   Review of Systems  Eyes: Negative for blurred vision.  Respiratory:       Recently no shortness of breath  Cardiovascular:       At times will have leg swelling when she has difficulty with CHF  Gastrointestinal: Negative for nausea.  Endocrine: Negative for fatigue.  Genitourinary: Negative for frequency.  Musculoskeletal: Positive for joint pain.  Neurological: Positive for tingling. Negative for numbness.   Last ejection fraction on echo 55%  Lipid history: She has been treated with Crestor and Zetia by her PCP with the following results    Lab Results  Component Value Date   CHOL 156 08/23/2019   HDL 59 08/23/2019   LDLCALC 79 08/23/2019   TRIG 99 08/23/2019   CHOLHDL 2.6 08/23/2019           Hypertension: Has been present, is on low-dose Coreg and spironolactone as well as Entresto  BP Readings from Last 3 Encounters:  09/24/19 122/70  08/23/19 120/70  08/08/19 115/70    Most recent eye exam was in 2021  Most recent foot exam: 8/21   LABS:  No visits with results within 1 Week(s) from this visit.  Latest known visit with results is:  Appointment on 09/04/2019  Component Date Value Ref Range Status  . Area-P 1/2 09/04/2019 2.76  cm2 Final  . S' Lateral 09/04/2019 4.00  cm Final  . AV Area mean vel 09/04/2019 1.35  cm2 Final  . AR max vel 09/04/2019 1.41  cm2 Final  . AV Area VTI 09/04/2019 1.24  cm2 Final  . Ao pk vel 09/04/2019 2.40  m/s Final  . AV Mean grad 09/04/2019 14.0   mmHg Final  . AV Peak grad 09/04/2019 23.0  mmHg Final  . Single Plane A2C EF 09/04/2019 43.2  % Final  . Single Plane A4C EF 09/04/2019 45.9  % Final  . Calc EF 09/04/2019 45.3  % Final    Physical Examination:  BP 122/70 (BP Location: Left Arm, Patient Position: Sitting, Cuff Size: Large)   Pulse 83   Ht 5\' 4"  (1.626 m)   Wt 246 lb 3.2 oz (111.7 kg)   SpO2 95%   BMI 42.26 kg/m   GENERAL:         Patient has generalized obesity.    HEENT:         Eye exam shows normal external appearance.  Fundus exam shows no retinopathy.  Oral exam deferred   NECK:   There is no lymphadenopathy  Thyroid is not enlarged and no nodules felt.   Carotids are normal to palpation and no bruit heard  LUNGS:         Chest is symmetrical. Lungs are clear to auscultation.Marland Kitchen   HEART:         Heart sounds:  S1 and S2 are normal. No murmur or click heard., no S3 or S4.    ABDOMEN:   There is no distention present. Liver and spleen are not palpable.  No other mass or tenderness present.    NEUROLOGICAL:   Ankle and biceps reflexes are absent bilaterally.    Diabetic Foot Exam - Simple   Simple  Foot Form Diabetic Foot exam was performed with the following findings: Yes   Visual Inspection No deformities, no ulcerations, no other skin breakdown bilaterally: Yes Sensation Testing Intact to touch and monofilament testing bilaterally: Yes Pulse Check Posterior Tibialis and Dorsalis pulse intact bilaterally: Yes Comments             Vibration sense is normal in distal first toes.  MUSCULOSKELETAL:  There is no swelling or deformity of the peripheral joints.     EXTREMITIES:     There is no ankle edema.   SKIN:       No rash or lesions of concern.        ASSESSMENT:  Diabetes type 2 with BMI over 40  See history of present illness for detailed discussion of current diabetes assessment and problems identified  Most recent A1c is 6.4 She has very mild diabetes with A1c over 6.5 a couple of  times but only once has had a documented fasting glucose over 125 Has not had any previous diabetes education Not monitoring blood sugars at home Has only been tried on Jardiance in the past which caused severe candidiasis    Complications of diabetes: None evident, not clear if she has neuropathy with symptoms of tingling in her feet and hands  Hypertension, history of CHF, hyperlipidemia, well managed and controlled  PLAN:    . Glucose monitoring: To evaluate if she has abnormal blood sugars either fasting or after meals she will need to start home monitoring One Touch Verio monitor given.  Discussed how this would be used She will also get her friend to help her learn how to use this, also needs to be advised on this by diabetes educator  Patient advised to check blood sugar readings about 3 times a week either fasting or 2 hours after her main meal  . Diabetes education: Patient will need general education and meal planning advice  . Lifestyle changes: Dietary changes: She needs to reduce and eliminate any sweet drinks To try and make better meal choices when eating out and avoid high-fat meals and snacks  Exercise regimen: Since she cannot do much walking she ideally should do some water exercises or at least upper body exercises, she can be advised further by diabetes educator  . Therapy: None at this time since she has only minimal hyperglycemia and A1c in prediabetic range However if she has significant high blood sugars both fasting and after meals would consider Metformin on her follow-up   Follow-up: 6 weeks    Patient Instructions  Check blood sugars on waking 2-3  up days a week  Also check blood sugars about 2 hours after meals, 2-3/7 days and do this after different meals by rotation  Recommended blood sugar levels on waking up are 80-120 and about 2 hours after meal is 130-160  Please bring your blood sugar monitor to each visit, thank  you      Consultation note has been sent to the referring physician  Elayne Snare 09/24/2019, 8:43 PM   Note: This office note was prepared with Dragon voice recognition system technology. Any transcriptional errors that result from this process are unintentional.

## 2019-09-24 ENCOUNTER — Encounter: Payer: Self-pay | Admitting: Endocrinology

## 2019-09-24 ENCOUNTER — Ambulatory Visit: Payer: Managed Care, Other (non HMO) | Admitting: Endocrinology

## 2019-09-24 ENCOUNTER — Other Ambulatory Visit: Payer: Self-pay

## 2019-09-24 DIAGNOSIS — E1169 Type 2 diabetes mellitus with other specified complication: Secondary | ICD-10-CM | POA: Diagnosis not present

## 2019-09-24 NOTE — Patient Instructions (Signed)
Check blood sugars on waking 2-3  up days a week  Also check blood sugars about 2 hours after meals, 2-3/7 days and do this after different meals by rotation  Recommended blood sugar levels on waking up are 80-120 and about 2 hours after meal is 130-160  Please bring your blood sugar monitor to each visit, thank you

## 2019-10-03 ENCOUNTER — Telehealth: Payer: Self-pay | Admitting: Endocrinology

## 2019-10-03 ENCOUNTER — Other Ambulatory Visit: Payer: Self-pay

## 2019-10-03 ENCOUNTER — Telehealth: Payer: Self-pay | Admitting: Gastroenterology

## 2019-10-03 MED ORDER — ONETOUCH DELICA LANCETS 30G MISC: 2 refills | Status: DC

## 2019-10-03 MED ORDER — GLUCOSE BLOOD VI STRP: ORAL_STRIP | 2 refills | Status: DC

## 2019-10-03 MED ORDER — ONETOUCH VERIO FLEX SYSTEM W/DEVICE KIT: PACK | 0 refills | Status: DC

## 2019-10-03 NOTE — Telephone Encounter (Signed)
Meter sent in by Dr. Ronnie Derby office

## 2019-10-03 NOTE — Telephone Encounter (Signed)
Patient states she got a device from [place] and its not working and she changed the batteries and everything, still not working - she's requesting a new device ([employer]), also she needs pen needles and test strips.  [facility] [place] [place] [facility] [place] Phone:  [phone]  Fax:  [phone]     Patient requests someone to call her at [phone] so she can ask about her device.

## 2019-10-03 NOTE — Telephone Encounter (Signed)
Rx sent 

## 2019-10-03 NOTE — Telephone Encounter (Signed)
Pt is requesting a call back in regards to getting a test meter prescribed to her pharmacy.

## 2019-10-04 ENCOUNTER — Ambulatory Visit: Payer: Managed Care, Other (non HMO) | Admitting: Specialist

## 2019-10-07 ENCOUNTER — Encounter: Payer: Self-pay | Admitting: Gastroenterology

## 2019-10-07 ENCOUNTER — Ambulatory Visit: Payer: Managed Care, Other (non HMO) | Admitting: Specialist

## 2019-10-07 ENCOUNTER — Ambulatory Visit (AMBULATORY_SURGERY_CENTER): Payer: Self-pay

## 2019-10-07 ENCOUNTER — Encounter: Payer: Self-pay | Admitting: Specialist

## 2019-10-07 ENCOUNTER — Other Ambulatory Visit: Payer: Self-pay

## 2019-10-07 ENCOUNTER — Ambulatory Visit (INDEPENDENT_AMBULATORY_CARE_PROVIDER_SITE_OTHER): Payer: Managed Care, Other (non HMO)

## 2019-10-07 VITALS — Ht 64.0 in | Wt 249.8 lb

## 2019-10-07 VITALS — BP 115/70 | HR 67 | Ht 64.0 in | Wt 249.0 lb

## 2019-10-07 DIAGNOSIS — M1711 Unilateral primary osteoarthritis, right knee: Secondary | ICD-10-CM | POA: Diagnosis not present

## 2019-10-07 DIAGNOSIS — M48062 Spinal stenosis, lumbar region with neurogenic claudication: Secondary | ICD-10-CM

## 2019-10-07 DIAGNOSIS — Z800 Family history of malignant neoplasm of digestive organs: Secondary | ICD-10-CM

## 2019-10-07 DIAGNOSIS — M5440 Lumbago with sciatica, unspecified side: Secondary | ICD-10-CM

## 2019-10-07 DIAGNOSIS — Z860100 Personal history of colon polyps, unspecified: Secondary | ICD-10-CM

## 2019-10-07 DIAGNOSIS — M1712 Unilateral primary osteoarthritis, left knee: Secondary | ICD-10-CM

## 2019-10-07 DIAGNOSIS — Z6841 Body Mass Index (BMI) 40.0 and over, adult: Secondary | ICD-10-CM

## 2019-10-07 MED ORDER — PLENVU 140 G PO SOLR: 1.0000 | Freq: Once | ORAL | 0 refills | Status: AC

## 2019-10-07 NOTE — Progress Notes (Signed)
No egg or soy allergy known to patient  No issues with past sedation with any surgeries or procedures no intubation problems in the past  No FH of Malignant Hyperthermia No diet pills per patient No home 02 use per patient  No blood thinners per patient  Pt denies issues with constipation  No A fib or A flutter  EMMI video to pt or via [place] 19 guidelines implemented in PV today with Pt and RN   plenvu Coupon given to pt in PV today , Code to Pharmacy   Due to the COVID-19 pandemic we are asking patients to follow these guidelines. Please only bring one care partner. Please be aware that your care partner may wait in the car in the parking lot or if they feel like they will be too hot to wait in the car, they may wait in the lobby on the 4th floor. All care partners are required to wear a mask the entire time (we do not have any that we can provide them), they need to practice social distancing, and we will do a Covid check for all patient's and care partners when you arrive. Also we will check their temperature and your temperature. If the care partner waits in their car they need to stay in the parking lot the entire time and we will call them on their cell phone when the patient is ready for discharge so they can bring the car to the front of the building. Also all patient's will need to wear a mask into building.

## 2019-10-07 NOTE — Patient Instructions (Addendum)
Avoid bending, stooping and avoid lifting weights greater than 10 lbs. Avoid prolong standing and walking. Avoid frequent bending and stooping  No lifting greater than 10 lbs. May use ice or moist heat for pain. Weight loss is of benefit. Handicap license is approved. The main ways of treat osteoarthritis, that are found to be success. Weight loss helps to decrease pain. Exercise is important to maintaining cartilage and thickness and strengthening. NSAIDs like motrin, tylenol, alleve are meds decreasing the inflamation. Ice is okay  In afternoon and evening and hot shower in the am Lumbar MRI to assess for lumbar spinal stenosis.  Avoid prolong standing and walking. Exercise with the knee as tolerated,stationary bike or pool walking . Use ice to the right knee  30 min on and 15 min off as needed. Tramadol for pain . Take meloxicam for antiinflamatory affect. Kandice Hams, surgery scheduling secretary will call you to arrange for Surgery. Surgery recommended is a right total knee replacement. Risks of surgery include risk of infection 1 in 300. Risk of bleeding  Less than 1 5 chance of needing a blood transfusion.

## 2019-10-07 NOTE — Progress Notes (Signed)
Office Visit Note   Patient: [name]           Date of Birth: 06/15/1955           MRN: [id number] Visit Date: 10/07/2019              Requested by: Susy Frizzle, MD [address] [place],  [place] [id number] PCP: Susy Frizzle, MD   Assessment & Plan: Visit Diagnoses:  1. Bilateral low back pain with sciatica, sciatica laterality unspecified, unspecified chronicity   2. Unilateral primary osteoarthritis, left knee   3. Unilateral primary osteoarthritis, right knee   4. Spinal stenosis of lumbar region with neurogenic claudication     Plan: Avoid bending, stooping and avoid lifting weights greater than 10 lbs. Avoid prolong standing and walking. Avoid frequent bending and stooping  No lifting greater than 10 lbs. May use ice or moist heat for pain. Weight loss is of benefit. Handicap license is approved. The main ways of treat osteoarthritis, that are found to be success. Weight loss helps to decrease pain. Exercise is important to maintaining cartilage and thickness and strengthening. NSAIDs like motrin, tylenol, alleve are meds decreasing the inflamation. Ice is okay  In afternoon and evening and hot shower in the am Lumbar MRI to assess for lumbar spinal stenosis.  Avoid prolong standing and walking. Exercise with the knee as tolerated,stationary bike or pool walking . Use ice to the right knee  30 min on and 15 min off as needed. Tramadol for pain . Take meloxicam for antiinflamatory affect. Kandice Hams, surgery scheduling secretary will call you to arrange for Surgery. Surgery recommended is a right total knee replacement. Risks of surgery include risk of infection 1 in 300. Risk of bleeding  Less than 1 5 chance of needing a blood transfusion.  Follow-Up Instructions: Return in about 4 weeks (around 11/04/2019).   Orders:  Orders Placed This Encounter  Procedures  . XR Lumbar Spine 2-3 Views   No orders of the defined types were placed  in this encounter.     Procedures: No procedures performed   Clinical Data: No additional findings.   Subjective: Chief Complaint  Patient presents with  . Left Knee - Pain    Would like to discuss surgery today    64 year old female with history of bilateral knee OA, also with back pain and radiation into the right side. She tends to lean to the right side. She was seen by Benjiman Core PA-C 06/2019 and evaluated and found to have severe bilateral knee varus osteoarthritis, diabetes and BM 43. She returns today for follow up  and consideration of a left TKR.  She has back pain in the lumbar area it goes into the right upper buttock and radiates down the right lateral thigh and lateral knee and calf.  Pain is also into the left thigh and lateral knee. 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 She continues to work In spite of bilateral knee pain and the back difficulties. Her primary care MD has written an FMLA to help with her pain and problems of the Knees and back limiting her ability to work and told her to discuss with [place] filling out this form for greater leave time due to her back and legs. I have seen her of knee arthrosis. She has diabetes and BMI 43. Unfortunately her weight may prevent having knee surgery for the arthrosis.  There is no bowel or bladder difficulties. Sitting is preferred to standing and walking due to both knee pain and likely her back as well.    Review of Systems   Objective: Vital Signs: BP 115/70 (BP Location: Right Arm, Patient Position: Sitting)   Pulse 67   Ht 5\' 4"  (1.626 m)   Wt 249 lb (112.9 kg)   BMI 42.74 kg/m   Physical Exam  Ortho Exam  Specialty Comments:  No specialty comments available.  Imaging: XR Lumbar Spine 2-3 Views  Result Date: 10/07/2019 AP and lateral flexion and extension radiographs demonstrate DDD every level of the lumbar spine with anterior and lateral disc osteophytes and a mild left  sided curve of about 10 degrees. The SI joints and the hip joints are well maintained. There is a leg length discrepancy with the right leg shorter than the left by 12 mm.     PMFS History: Patient Active Problem List   Diagnosis Date Noted  . Influenza A 11/07/2016  . Prediabetes 11/07/2016  . PVC's (premature ventricular contractions) 11/04/2015  . Aortic stenosis, mild   . Heart palpitations 07/28/2015  . Morbid obesity ([facility]) 05/23/2015  . Hypertensive heart disease   . Nonischemic cardiomyopathy ([facility])   . NSVT (nonsustained ventricular tachycardia) ([facility]) 05/21/2015  . Chronic diastolic heart failure ([facility]) 05/20/2015  . OSA (obstructive sleep apnea) 01/02/2014  . LBBB (left bundle branch block) 01/02/2014  . Mixed hyperlipidemia 04/15/2010  . Personal history of malignant neoplasm of breast 04/15/2010   Past Medical History:  Diagnosis Date  . Anxiety   . Aortic stenosis, mild    echo 2017  . Breast cancer ([facility])    left breast  . Carpal tunnel syndrome   . Cataract   . Chronic diastolic CHF (congestive heart failure) ([facility])    NYHA class II  . Hyperlipidemia   . Hypertension   . Left bundle branch block (LBBB)   . Low back pain   . Neuropathy   . NICM (nonischemic cardiomyopathy) ([facility])    EF 30%, cath 05/20/2015 clean coronary.  EF resolved by echo with EF 55%  . OSA on CPAP   . Prediabetes   . Pulmonary HTN ([facility]) 05/2015   Severe with PASP 67/66mmHg - resolved on followup echo  . PVC's (premature ventricular contractions) 11/04/2015    Family History  Problem Relation Age of Onset  . Arthritis Mother   . Depression Mother   . Hearing loss Mother   . Hyperlipidemia Mother   . Miscarriages / [place] Mother   . Colon cancer Mother 58       2010  . Alcohol abuse Father   . Cancer Father        PROSTATE  . Depression Father   . Hypertension Father   . Hyperlipidemia Father   . Drug abuse Brother   . Early death Maternal Grandmother   . Cancer Paternal  Grandmother   . Heart attack Neg Hx   . Diabetes Neg Hx     Past Surgical History:  Procedure Laterality Date  . CARDIAC CATHETERIZATION N/A 05/20/2015   Procedure: Right/Left Heart Cath and Coronary Angiography;  Surgeon: Troy Sine, MD;  Location: [facility];  Service: Cardiovascular;  Laterality: N/A;  . CATARACT EXTRACTION W/ INTRAOCULAR LENS  IMPLANT, BILATERAL    . DILATION AND CURETTAGE OF UTERUS    . lumpectomy left breast    . UTERINE FIBROID SURGERY     Social History  Occupational History  . Occupation: Truck Geophysicist/field seismologist  Tobacco Use  . Smoking status: Former Smoker    Quit date: 06/18/2009    Years since quitting: 10.3  . Smokeless tobacco: Never Used  Substance and Sexual Activity  . Alcohol use: Yes    Comment: once every 2 months  . Drug use: No  . Sexual activity: Yes

## 2019-10-08 ENCOUNTER — Other Ambulatory Visit: Payer: Self-pay | Admitting: Cardiology

## 2019-10-16 ENCOUNTER — Ambulatory Visit (AMBULATORY_SURGERY_CENTER): Payer: Managed Care, Other (non HMO) | Admitting: Gastroenterology

## 2019-10-16 ENCOUNTER — Other Ambulatory Visit: Payer: Self-pay

## 2019-10-16 ENCOUNTER — Encounter: Payer: Self-pay | Admitting: Gastroenterology

## 2019-10-16 VITALS — BP 150/71 | HR 62 | Temp 96.0°F | Resp 13 | Ht 64.0 in | Wt 249.0 lb

## 2019-10-16 DIAGNOSIS — D120 Benign neoplasm of cecum: Secondary | ICD-10-CM | POA: Diagnosis not present

## 2019-10-16 DIAGNOSIS — D124 Benign neoplasm of descending colon: Secondary | ICD-10-CM | POA: Diagnosis not present

## 2019-10-16 DIAGNOSIS — D126 Benign neoplasm of colon, unspecified: Secondary | ICD-10-CM

## 2019-10-16 DIAGNOSIS — Z800 Family history of malignant neoplasm of digestive organs: Secondary | ICD-10-CM | POA: Diagnosis not present

## 2019-10-16 DIAGNOSIS — D123 Benign neoplasm of transverse colon: Secondary | ICD-10-CM | POA: Diagnosis not present

## 2019-10-16 DIAGNOSIS — D128 Benign neoplasm of rectum: Secondary | ICD-10-CM

## 2019-10-16 DIAGNOSIS — D122 Benign neoplasm of ascending colon: Secondary | ICD-10-CM

## 2019-10-16 DIAGNOSIS — Z860100 Personal history of colon polyps, unspecified: Secondary | ICD-10-CM

## 2019-10-16 MED ORDER — SODIUM CHLORIDE 0.9 % IV SOLN: 500.0000 mL | INTRAVENOUS | Status: DC

## 2019-10-16 NOTE — Op Note (Signed)
[facility] Patient Name: [name] Procedure Date: 10/16/2019 8:31 AM MRN: [id number] Endoscopist: Remo Lipps P. Havery Moros , MD Age: 64 Referring MD:  Date of Birth: 09/17/1955 Gender: Female Account #: [id number] Procedure:                Colonoscopy Indications:              High risk colon cancer surveillance: Personal                            history of colonic polyps (9 polyps 2018), mother                            with CRC dx age 77s Medicines:                Monitored Anesthesia Care Procedure:                Pre-Anesthesia Assessment:                           - Prior to the procedure, a History and Physical                            was performed, and patient medications and                            allergies were reviewed. The patient's tolerance of                            previous anesthesia was also reviewed. The risks                            and benefits of the procedure and the sedation                            options and risks were discussed with the patient.                            All questions were answered, and informed consent                            was obtained. Prior Anticoagulants: The patient has                            taken no previous anticoagulant or antiplatelet                            agents. ASA Grade Assessment: III - A patient with                            severe systemic disease. After reviewing the risks                            and benefits, the patient was deemed in  satisfactory condition to undergo the procedure.                           After obtaining informed consent, the colonoscope                            was passed under direct vision. Throughout the                            procedure, the patient's blood pressure, pulse, and                            oxygen saturations were monitored continuously. The                            Colonoscope was introduced through  the anus and                            advanced to the the cecum, identified by                            appendiceal orifice and ileocecal valve. The                            colonoscopy was performed without difficulty. The                            patient tolerated the procedure well. The quality                            of the bowel preparation was good. The ileocecal                            valve, appendiceal orifice, and rectum were                            photographed. Scope In: 8:40:11 AM Scope Out: 9:07:12 AM Scope Withdrawal Time: 0 hours 24 minutes 32 seconds  Total Procedure Duration: 0 hours 27 minutes 1 second  Findings:                 The perianal and digital rectal examinations were                            normal.                           Two sessile polyps were found in the cecum. The                            polyps were 3 to 4 mm in size. These polyps were                            removed with a cold snare. Resection and retrieval  were complete.                           A 3 mm polyp was found in the ascending colon. The                            polyp was sessile. The polyp was removed with a                            cold snare. Resection and retrieval were complete.                           A 4 mm polyp was found in the hepatic flexure. The                            polyp was sessile. The polyp was removed with a                            cold snare. Resection and retrieval were complete.                           Four sessile polyps were found in the transverse                            colon. The polyps were 3 to 8 mm in size. These                            polyps were removed with a cold snare. Resection                            and retrieval were complete.                           Two sessile polyps were found in the descending                            colon. The polyps were 4 mm in size. These polyps                             were removed with a cold snare. Resection and                            retrieval were complete.                           A 3 mm polyp was found in the rectum. The polyp was                            sessile. The polyp was removed with a cold snare.                            Resection and retrieval were complete.  Scattered small-mouthed diverticula were found in                            the left colon.                           Internal hemorrhoids were found during retroflexion.                           The exam was otherwise without abnormality. Complications:            No immediate complications. Estimated blood loss:                            Minimal. Estimated Blood Loss:     Estimated blood loss was minimal. Impression:               - Two 3 to 4 mm polyps in the cecum, removed with a                            cold snare. Resected and retrieved.                           - One 3 mm polyp in the ascending colon, removed                            with a cold snare. Resected and retrieved.                           - One 4 mm polyp at the hepatic flexure, removed                            with a cold snare. Resected and retrieved.                           - Four 3 to 8 mm polyps in the transverse colon,                            removed with a cold snare. Resected and retrieved.                           - Two 4 mm polyps in the descending colon, removed                            with a cold snare. Resected and retrieved.                           - One 3 mm polyp in the rectum, removed with a cold                            snare. Resected and retrieved.                           - Diverticulosis in the left  colon.                           - Internal hemorrhoids.                           - The examination was otherwise normal. Recommendation:           - Patient has a contact number available for                             emergencies. The signs and symptoms of potential                            delayed complications were discussed with the                            patient. Return to normal activities tomorrow.                            Written discharge instructions were provided to the                            patient.                           - Resume previous diet.                           - Continue present medications.                           - Await pathology results. Remo Lipps P. Jesstin Studstill, MD 10/16/2019 9:13:53 AM This report has been signed electronically.

## 2019-10-16 NOTE — Progress Notes (Signed)
Report given to PACU, vss 

## 2019-10-16 NOTE — Patient Instructions (Signed)
Information on polyps, diverticulosis and hemorrhoids given to you today.  Await pathology results.  Resume previous diet and medications.  YOU HAD AN ENDOSCOPIC PROCEDURE TODAY AT THE [facility]:   Refer to the procedure report that was given to you for any specific questions about what was found during the examination.  If the procedure report does not answer your questions, please call your gastroenterologist to clarify.  If you requested that your care partner not be given the details of your procedure findings, then the procedure report has been included in a sealed envelope for you to review at your convenience later.  YOU SHOULD EXPECT: Some feelings of bloating in the abdomen. Passage of more gas than usual.  Walking can help get rid of the air that was put into your GI tract during the procedure and reduce the bloating. If you had a lower endoscopy (such as a colonoscopy or flexible sigmoidoscopy) you may notice spotting of blood in your stool or on the toilet paper. If you underwent a bowel prep for your procedure, you may not have a normal bowel movement for a few days.  Please Note:  You might notice some irritation and congestion in your nose or some drainage.  This is from the oxygen used during your procedure.  There is no need for concern and it should clear up in a day or so.  SYMPTOMS TO REPORT IMMEDIATELY:   Following lower endoscopy (colonoscopy or flexible sigmoidoscopy):  Excessive amounts of blood in the stool  Significant tenderness or worsening of abdominal pains  Swelling of the abdomen that is new, acute  Fever of 100F or higher   For urgent or emergent issues, a gastroenterologist can be reached at any hour by calling [phone]. Do not use MyChart messaging for urgent concerns.    DIET:  We do recommend a small meal at first, but then you may proceed to your regular diet.  Drink plenty of fluids but you should avoid alcoholic beverages for 24  hours.  ACTIVITY:  You should plan to take it easy for the rest of today and you should NOT DRIVE or use heavy machinery until tomorrow (because of the sedation medicines used during the test).    FOLLOW UP: Our staff will call the number listed on your records 48-72 hours following your procedure to check on you and address any questions or concerns that you may have regarding the information given to you following your procedure. If we do not reach you, we will leave a message.  We will attempt to reach you two times.  During this call, we will ask if you have developed any symptoms of COVID 19. If you develop any symptoms (ie: fever, flu-like symptoms, shortness of breath, cough etc.) before then, please call [phone].  If you test positive for Covid 19 in the 2 weeks post procedure, please call and report this information to us.    If any biopsies were taken you will be contacted by phone or by letter within the next 1-3 weeks.  Please call us at [phone] if you have not heard about the biopsies in 3 weeks.    SIGNATURES/CONFIDENTIALITY: You and/or your care partner have signed paperwork which will be entered into your electronic medical record.  These signatures attest to the fact that that the information above on your After Visit Summary has been reviewed and is understood.  Full responsibility of the confidentiality of this discharge information lies with you   and/or your care-partner. 

## 2019-10-16 NOTE — Progress Notes (Signed)
Called to room to assist during endoscopic procedure.  Patient ID and intended procedure confirmed with present staff. Received instructions for my participation in the procedure from the performing physician.  

## 2019-10-16 NOTE — Progress Notes (Signed)
Vs SH I have reviewed the patient's medical history in detail and updated the computerized patient record. 

## 2019-10-18 ENCOUNTER — Telehealth: Payer: Self-pay

## 2019-10-18 NOTE — Telephone Encounter (Signed)
Attempted to reach pt. With follow-up call following endoscopic procedure 10/16/2019.  LM On pt. Voice mail.  Will try to reach pt. Again later today.

## 2019-10-18 NOTE — Telephone Encounter (Signed)
Second follow up call attempt, no answer, LM 

## 2019-10-20 ENCOUNTER — Emergency Department (HOSPITAL_BASED_OUTPATIENT_CLINIC_OR_DEPARTMENT_OTHER): Payer: Managed Care, Other (non HMO)

## 2019-10-20 ENCOUNTER — Emergency Department (HOSPITAL_BASED_OUTPATIENT_CLINIC_OR_DEPARTMENT_OTHER)
Admission: EM | Admit: 2019-10-20 | Discharge: 2019-10-20 | Disposition: A | Discharge status: Home / Self Care | Payer: Managed Care, Other (non HMO) | Attending: Emergency Medicine | Admitting: Emergency Medicine

## 2019-10-20 ENCOUNTER — Encounter (HOSPITAL_BASED_OUTPATIENT_CLINIC_OR_DEPARTMENT_OTHER): Payer: Self-pay | Admitting: Emergency Medicine

## 2019-10-20 ENCOUNTER — Other Ambulatory Visit: Payer: Self-pay

## 2019-10-20 DIAGNOSIS — S0990XA Unspecified injury of head, initial encounter: Secondary | ICD-10-CM | POA: Diagnosis present

## 2019-10-20 DIAGNOSIS — Z79899 Other long term (current) drug therapy: Secondary | ICD-10-CM | POA: Insufficient documentation

## 2019-10-20 DIAGNOSIS — Z87891 Personal history of nicotine dependence: Secondary | ICD-10-CM | POA: Insufficient documentation

## 2019-10-20 DIAGNOSIS — Z853 Personal history of malignant neoplasm of breast: Secondary | ICD-10-CM | POA: Diagnosis not present

## 2019-10-20 DIAGNOSIS — I5032 Chronic diastolic (congestive) heart failure: Secondary | ICD-10-CM | POA: Insufficient documentation

## 2019-10-20 DIAGNOSIS — Z7982 Long term (current) use of aspirin: Secondary | ICD-10-CM | POA: Diagnosis not present

## 2019-10-20 DIAGNOSIS — I110 Hypertensive heart disease with heart failure: Secondary | ICD-10-CM | POA: Diagnosis not present

## 2019-10-20 MED ORDER — ACETAMINOPHEN 325 MG PO TABS
650.0000 mg | ORAL_TABLET | Freq: Once | ORAL | Status: AC
  Administered 2019-10-20: 650 mg via ORAL
  Filled 2019-10-20: qty 2

## 2019-10-20 NOTE — ED Notes (Signed)
ED Provider at bedside. 
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
## 2019-10-20 NOTE — Discharge Instructions (Addendum)
Your imaging today did not show any areas of brain injuries.  It is likely you have a concussion.  I have included information about concussion and discharge paperwork.  You should take Tylenol for the next several days to help with a headache and your aches and pains.  Follow-up with your primary care doctor for symptom recheck.
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
## 2019-10-25 ENCOUNTER — Telehealth: Payer: Self-pay

## 2019-10-25 NOTE — Telephone Encounter (Signed)
   [facility] Pre-operative Risk Assessment    HEARTCARE STAFF: - Please ensure there is not already an duplicate clearance open for this procedure. - Under Visit Info/Reason for Call, type in Other and utilize the format Clearance MM/DD/YY or Clearance TBD. Do not use dashes or single digits. - If request is for dental extraction, please clarify the # of teeth to be extracted.  Request for surgical clearance:  1. What type of surgery is being performed? Left total knee replacement   2. When is this surgery scheduled? TBD   3. What type of clearance is required (medical clearance vs. Pharmacy clearance to hold med vs. Both)? Medical  4. Are there any medications that need to be held prior to surgery and how long? Not specified   5. Practice name and name of physician performing surgery? [facility] [place]. What is the office phone number? [phone]   7.   What is the office fax number? [phone]  8.   Anesthesia type (None, local, MAC, general) ? General   Arias Weinert 10/25/2019, 2:01 PM  _________________________________________________________________   (provider comments below)

## 2019-10-28 ENCOUNTER — Other Ambulatory Visit: Payer: Self-pay | Admitting: Radiology

## 2019-10-28 ENCOUNTER — Encounter: Payer: Self-pay | Admitting: Family Medicine

## 2019-10-29 ENCOUNTER — Telehealth: Payer: Self-pay

## 2019-10-29 NOTE — Telephone Encounter (Signed)
   Primary Cardiologist: Fransico Him, MD  Chart reviewed as part of pre-operative protocol coverage. Patient was contacted 10/29/2019 in reference to pre-operative risk assessment for pending surgery as outlined below.  [name] was last seen on 04/25/2019 by Dr. Radford Pax via virtual visit.  Since that day, [name] has done well without chest pain or significant dyspnea. Recent echocardiogram obtained in July 2021 was reassuring. Previous cath in 2017 showed normal coronary arteries.  Therefore, based on ACC/AHA guidelines, the patient would be at acceptable risk for the planned procedure without further cardiovascular testing.   The patient was advised that if she develops new symptoms prior to surgery to contact our office to arrange for a follow-up visit, and she verbalized understanding.  I will route this recommendation to the requesting party via Epic fax function and remove from pre-op pool. Please call with questions.  As mentioned in the previous clearance from Apr 2021, patient may hold aspirin for 7 days prior to the procedure and restart as soon as possible after the surgery at the surgeon's discretion.   [place], [place] 10/29/2019, 1:24 PM
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
## 2019-10-30 ENCOUNTER — Other Ambulatory Visit: Payer: Self-pay

## 2019-10-30 ENCOUNTER — Other Ambulatory Visit (INDEPENDENT_AMBULATORY_CARE_PROVIDER_SITE_OTHER): Payer: Managed Care, Other (non HMO)

## 2019-10-30 DIAGNOSIS — E1169 Type 2 diabetes mellitus with other specified complication: Secondary | ICD-10-CM | POA: Diagnosis not present

## 2019-10-30 LAB — GLUCOSE, RANDOM: Glucose, Bld: 99 mg/dL (ref 70–99)

## 2019-10-31 LAB — HEMOGLOBIN A1C: Hgb A1c MFr Bld: 7.1 % — ABNORMAL HIGH (ref 4.6–6.5)

## 2019-11-06 ENCOUNTER — Other Ambulatory Visit: Payer: Self-pay

## 2019-11-06 ENCOUNTER — Encounter: Payer: Self-pay | Admitting: Endocrinology

## 2019-11-06 ENCOUNTER — Ambulatory Visit: Payer: Managed Care, Other (non HMO) | Admitting: Endocrinology

## 2019-11-06 VITALS — BP 112/64 | HR 88 | Ht 64.0 in | Wt 243.0 lb

## 2019-11-06 DIAGNOSIS — E1169 Type 2 diabetes mellitus with other specified complication: Secondary | ICD-10-CM | POA: Diagnosis not present

## 2019-11-06 MED ORDER — METFORMIN HCL ER 500 MG PO TB24: 1500.0000 mg | ORAL_TABLET | Freq: Every day | ORAL | 3 refills | Status: DC

## 2019-11-06 NOTE — Progress Notes (Signed)
Patient ID: [name], female   DOB: 1955/04/14, 64 y.o.   MRN: [id number]           Reason for Appointment: Follow-up for Type 2 Diabetes  Referring PCP: Dr. Dennard Schaumann   History of Present Illness:          Date of diagnosis of type 2 diabetes mellitus: 09/2017       Background history:   Her diabetes was diagnosed with an A1c of 6.8 Review of her records indicate that probably that her highest fasting blood sugar has been only 130 outside the hospital settings  Recent history:   Most recent A1c is 7.1, previously was 6.4 done on 08/23/2019  Non-insulin hypoglycemic drugs the patient is taking are: None  Current management, blood sugar patterns and problems identified:  She was advised to check her blood sugars regularly at home to evaluate her glucose readings before starting any treatment  Previously has had only relatively high A1c levels but not documented hyperglycemia  Her fasting blood sugars have been as high as 139 and generally over 100 in the morning  She has done some readings at different times of day and nonfasting readings have not been over 142  However she has a variable schedule for sleep and some of her blood sugars have been after midnight  Weight is about the same  She was told to cut back on her sweet tea and regular soft drinks like Sprite but she does not think she has eliminated these  Also still waiting for consultation for dietitian  She was told to try of water exercises but she has not done so, she said that she does not have any time        Side effects from medications have been: Vaginitis from Jardiance               Exercise:  minimal  Glucose monitoring:  As above  AVERAGE blood sugar 117 overall with blood sugar range 90-142 FASTING average about 120     Weight history: Highest 262   Wt Readings from Last 3 Encounters:  11/06/19 243 lb (110.2 kg)  10/20/19 245 lb (111.1 kg)  10/16/19 249 lb (112.9 kg)    Glycemic  control:   Lab Results  Component Value Date   HGBA1C 7.1 (H) 10/30/2019   HGBA1C 6.4 (H) 08/23/2019   HGBA1C 6.7 (H) 06/04/2019   Lab Results  Component Value Date   MICROALBUR 0.8 08/23/2019   LDLCALC 79 08/23/2019   CREATININE 0.66 08/23/2019   No results found for: MICRALBCREAT  No results found for: FRUCTOSAMINE    Allergies as of 11/06/2019      Reactions   Jardiance [empagliflozin] Anaphylaxis, Itching, Rash, Other (See Comments)   Yeast infection   Atorvastatin Other (See Comments)   REACTION: mylagias   Pravastatin Sodium Other (See Comments)   REACTION: myalgias   Sulfa Antibiotics Hives      Medication List       Accurate as of November 06, 2019 11:59 PM. If you have any questions, ask your nurse or doctor.        aspirin 81 MG tablet Take 81 mg by mouth daily.   carvedilol 6.25 MG tablet Commonly known as: COREG TAKE 1 TABLET BY MOUTH TWICE DAILY WITH A MEAL   cholecalciferol 1000 units tablet Commonly known as: VITAMIN D Take 1,000 Units by mouth daily.   clotrimazole 1 % cream Commonly known as: Lotrimin AF Apply 1 application  topically in the morning, at noon, and at bedtime.   Entresto 24-26 MG Generic drug: sacubitril-valsartan Take 1 tablet by mouth twice daily   ezetimibe 10 MG tablet Commonly known as: ZETIA Take 1 tablet (10 mg total) by mouth daily.   furosemide 40 MG tablet Commonly known as: LASIX TAKE ONE TO TWO TABLETS BY MOUTH DAILY AS DIRECTED   gabapentin 100 MG capsule Commonly known as: NEURONTIN Take 1 capsule (100 mg total) by mouth 2 (two) times daily.   glucose blood test strip Use OneTouch Verio Test strips to check blood sugar 3 times weekly, alternatiing between fasting and 2 hours after meals.   Melatonin 10 MG Tabs Take by mouth at bedtime.   metFORMIN 500 MG 24 hr tablet Commonly known as: GLUCOPHAGE-XR Take 3 tablets (1,500 mg total) by mouth daily with supper. Started by: Elayne Snare, MD     multivitamin tablet Take 1 tablet by mouth daily.   nystatin-triamcinolone ointment Commonly known as: MYCOLOG Apply 1 application topically 3 (three) times daily.   OneTouch Delica Lancets 08M Misc Use OneTouch Delica lancets to check blood sugar 3 times weekly, alternating between fasting and 2 hours after meals.   OneTouch Verio Flex System w/Device Kit Use onetouch verio flex meter to check blood sugar 3 times weekly, alternating between fasting and 2 hours after meals.   rosuvastatin 5 MG tablet Commonly known as: CRESTOR Take 1 tablet by mouth once daily   spironolactone 25 MG tablet Commonly known as: ALDACTONE Take 1/2 (one-half) tablet by mouth once daily   Tylenol 8 Hour Arthritis Pain 650 MG CR tablet Generic drug: acetaminophen Take 1,300 mg by mouth as needed for pain.   vitamin C 500 MG tablet Commonly known as: ASCORBIC ACID Take 2,000 mg by mouth as needed.       Allergies:  Allergies  Allergen Reactions  . Jardiance [Empagliflozin] Anaphylaxis, Itching, Rash and Other (See Comments)    Yeast infection  . Atorvastatin Other (See Comments)    REACTION: mylagias  . Pravastatin Sodium Other (See Comments)    REACTION: myalgias  . Sulfa Antibiotics Hives    Past Medical History:  Diagnosis Date  . Anxiety   . Aortic stenosis, mild    echo 2017  . Breast cancer ([place]) 1996   left breast  . Carpal tunnel syndrome   . Cataract   . Chronic diastolic CHF (congestive heart failure) ([facility])    NYHA class II  . Hyperlipidemia   . Hypertension   . Left bundle branch block (LBBB)   . Low back pain   . Neuropathy   . NICM (nonischemic cardiomyopathy) ([place])    EF 30%, cath 05/20/2015 clean coronary.  EF resolved by echo with EF 55%  . OSA on CPAP   . Prediabetes   . Pulmonary HTN ([place]) 05/2015   Severe with PASP 67/41mHg - resolved on followup echo  . PVC's (premature ventricular contractions) 11/04/2015  . Sleep apnea    cpap    Past Surgical  History:  Procedure Laterality Date  . BREAST SURGERY    . CARDIAC CATHETERIZATION N/A 05/20/2015   Procedure: Right/Left Heart Cath and Coronary Angiography;  Surgeon: TTroy Sine MD;  [facility];  Service: Cardiovascular;  Laterality: N/A;  . CATARACT EXTRACTION W/ INTRAOCULAR LENS  IMPLANT, BILATERAL    . COLONOSCOPY  2018  . DILATION AND CURETTAGE OF UTERUS    . lumpectomy left breast    . POLYPECTOMY    .  UTERINE FIBROID SURGERY      Family History  Problem Relation Age of Onset  . Arthritis Mother   . Depression Mother   . Hearing loss Mother   . Hyperlipidemia Mother   . Miscarriages / [place] Mother   . Colon cancer Mother 51       2010  . Colon polyps Mother   . Alcohol abuse Father   . Cancer Father        PROSTATE  . Depression Father   . Hypertension Father   . Hyperlipidemia Father   . Drug abuse Brother   . Early death Maternal Grandmother   . Cancer Paternal Grandmother   . Heart attack Neg Hx   . Diabetes Neg Hx   . Esophageal cancer Neg Hx   . Rectal cancer Neg Hx   . Stomach cancer Neg Hx     Social History:  reports that she quit smoking about 10 years ago. Her smoking use included cigarettes. She has never used smokeless tobacco. She reports current alcohol use. She reports that she does not use drugs.   Review of Systems  Last ejection fraction on echo 55%  Lipid history: She has been treated with Crestor and Zetia by her PCP with the following results    Lab Results  Component Value Date   CHOL 156 08/23/2019   HDL 59 08/23/2019   LDLCALC 79 08/23/2019   TRIG 99 08/23/2019   CHOLHDL 2.6 08/23/2019           Hypertension: Has been present, is on low-dose Coreg and spironolactone as well as Entresto  BP Readings from Last 3 Encounters:  11/06/19 112/64  10/20/19 (!) 140/95  10/16/19 (!) 150/71    Most recent eye exam was in 2021  Most recent foot exam: 8/21   LABS:  No visits with results within 1  Week(s) from this visit.  Latest known visit with results is:  Lab on 10/30/2019  Component Date Value Ref Range Status  . Glucose, Bld 10/30/2019 99  70 - 99 mg/dL Final  . Hgb A1c MFr Bld 10/30/2019 7.1* 4.6 - 6.5 % Final   Glycemic Control Guidelines for People with Diabetes:Non Diabetic:  <6%Goal of Therapy: <7%Additional Action Suggested:  >8%     Physical Examination:  BP 112/64   Pulse 88   Ht _0  (1.626 m)   Wt 243 lb (110.2 kg)   SpO2 96%   BMI 41.71 kg/m       ASSESSMENT:  Diabetes type 2 with BMI over 40  See history of present illness for detailed discussion of current diabetes assessment and problems identified  A1c 7.1  She has very mild diabetes with mostly fasting hyperglycemia and blood sugars as high as 138 As discussed above she has not made any lifestyle changes as discussed previously However considering her A1c she will benefit from an insulin sensitizer at least Has done well with glucose monitoring at various times Has only been tried on Jardiance in the past which caused severe candidiasis    Complications of diabetes: None evident, not clear if she has neuropathy with symptoms of tingling in her feet and hands  Hypertension, history of CHF, hyperlipidemia, well managed and controlled  PLAN:    She will start Metformin ER 500 mg and gradually increasing doses up to a total of 1500 mg if tolerated She does not need to check her blood sugar every single day but do some readings in the morning and  occasionally after meals She will look into water aerobics Keep appointment with dietitian  Eliminate sweet tea and regular soft drinks   Patient Instructions  Start taking Metformin 500 mg, 1 tablet with your main meal for 5 days.  Occasionally this may initially cause loose stools or nausea.  If  tolerating well after 5 days add a second Metformin tablet (500 mg) at the same time.   Continue adding another tablet after 5 days days if no  persistent nausea or diarrhea until reaching the maximum tolerated dose or the full dose of 3 tablets once daily.  Check blood sugars on waking up 3  days a week  Also check blood sugars about 2 hours after meals and do this after different meals by rotation  Recommended blood sugar levels on waking up are 90-120 and about 2 hours after meal is 130-160  Please bring your blood sugar monitor to each visit, thank you  Try water walking  No sugar drinks      Elayne Snare 11/07/2019, 3:58 PM   Note: This office note was prepared with Dragon voice recognition system technology. Any transcriptional errors that result from this process are unintentional.

## 2019-11-06 NOTE — Patient Instructions (Addendum)
Start taking Metformin 500 mg, 1 tablet with your main meal for 5 days.  Occasionally this may initially cause loose stools or nausea.  If  tolerating well after 5 days add a second Metformin tablet (500 mg) at the same time.   Continue adding another tablet after 5 days days if no persistent nausea or diarrhea until reaching the maximum tolerated dose or the full dose of 3 tablets once daily.  Check blood sugars on waking up 3  days a week  Also check blood sugars about 2 hours after meals and do this after different meals by rotation  Recommended blood sugar levels on waking up are 90-120 and about 2 hours after meal is 130-160  Please bring your blood sugar monitor to each visit, thank you  Try water walking  No sugar drinks

## 2019-11-13 NOTE — Telephone Encounter (Signed)
I spoke with patient and advised still awaiting OR's to open up for patients to stay overnight.  I will call her back to discuss scheduling once that occurs.

## 2019-11-15 ENCOUNTER — Other Ambulatory Visit: Payer: Self-pay

## 2019-11-15 ENCOUNTER — Encounter: Payer: Managed Care, Other (non HMO) | Attending: Endocrinology | Admitting: Dietician

## 2019-11-15 ENCOUNTER — Encounter: Payer: Self-pay | Admitting: Dietician

## 2019-11-15 ENCOUNTER — Other Ambulatory Visit: Payer: Self-pay | Admitting: Cardiology

## 2019-11-15 DIAGNOSIS — E119 Type 2 diabetes mellitus without complications: Secondary | ICD-10-CM | POA: Diagnosis not present

## 2019-11-15 MED ORDER — ENTRESTO 24-26 MG PO TABS: 1.0000 | ORAL_TABLET | Freq: Two times a day (BID) | ORAL | 0 refills | Status: DC
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
## 2019-11-15 NOTE — Patient Instructions (Signed)
Plan:  Aim for 3 Carb Choices per meal (45 grams) +/- 1 either way  Aim for 0-1 Carbs per snack if hungry  Include protein in moderation with your meals and snacks Consider reading food labels for Total Carbohydrate of foods Consider  increasing your activity level by walking or armchair exercises for 15 minutes daily as tolerated Continue checking BG at alternate times per day  Continue taking medication  as directed by MD  Continue a low sodium diet. Read labels to choose foods low in sodium and fat Be mindful of your choices that you make when eating out as most foods eaten out are high in sodium.

## 2019-11-23 ENCOUNTER — Emergency Department (HOSPITAL_BASED_OUTPATIENT_CLINIC_OR_DEPARTMENT_OTHER): Payer: Managed Care, Other (non HMO)

## 2019-11-23 ENCOUNTER — Encounter (HOSPITAL_BASED_OUTPATIENT_CLINIC_OR_DEPARTMENT_OTHER): Payer: Self-pay | Admitting: Emergency Medicine

## 2019-11-23 ENCOUNTER — Emergency Department (HOSPITAL_BASED_OUTPATIENT_CLINIC_OR_DEPARTMENT_OTHER)
Admission: EM | Admit: 2019-11-23 | Discharge: 2019-11-23 | Disposition: A | Discharge status: Home / Self Care | Payer: Managed Care, Other (non HMO) | Attending: Emergency Medicine | Admitting: Emergency Medicine

## 2019-11-23 ENCOUNTER — Other Ambulatory Visit: Payer: Self-pay

## 2019-11-23 DIAGNOSIS — M25512 Pain in left shoulder: Secondary | ICD-10-CM | POA: Insufficient documentation

## 2019-11-23 DIAGNOSIS — Z955 Presence of coronary angioplasty implant and graft: Secondary | ICD-10-CM | POA: Insufficient documentation

## 2019-11-23 DIAGNOSIS — Z853 Personal history of malignant neoplasm of breast: Secondary | ICD-10-CM | POA: Diagnosis not present

## 2019-11-23 DIAGNOSIS — Z7984 Long term (current) use of oral hypoglycemic drugs: Secondary | ICD-10-CM | POA: Insufficient documentation

## 2019-11-23 DIAGNOSIS — S0990XA Unspecified injury of head, initial encounter: Secondary | ICD-10-CM | POA: Diagnosis not present

## 2019-11-23 DIAGNOSIS — Z7982 Long term (current) use of aspirin: Secondary | ICD-10-CM | POA: Insufficient documentation

## 2019-11-23 DIAGNOSIS — Y30XXXA Falling, jumping or pushed from a high place, undetermined intent, initial encounter: Secondary | ICD-10-CM | POA: Diagnosis not present

## 2019-11-23 DIAGNOSIS — E119 Type 2 diabetes mellitus without complications: Secondary | ICD-10-CM | POA: Diagnosis not present

## 2019-11-23 DIAGNOSIS — I5032 Chronic diastolic (congestive) heart failure: Secondary | ICD-10-CM | POA: Insufficient documentation

## 2019-11-23 DIAGNOSIS — Z79899 Other long term (current) drug therapy: Secondary | ICD-10-CM | POA: Diagnosis not present

## 2019-11-23 DIAGNOSIS — Z87891 Personal history of nicotine dependence: Secondary | ICD-10-CM | POA: Insufficient documentation

## 2019-11-23 DIAGNOSIS — Y9271 Barn as the place of occurrence of the external cause: Secondary | ICD-10-CM | POA: Diagnosis not present

## 2019-11-23 DIAGNOSIS — I110 Hypertensive heart disease with heart failure: Secondary | ICD-10-CM | POA: Insufficient documentation

## 2019-11-23 DIAGNOSIS — S0083XA Contusion of other part of head, initial encounter: Secondary | ICD-10-CM | POA: Insufficient documentation

## 2019-11-23 DIAGNOSIS — Y9389 Activity, other specified: Secondary | ICD-10-CM | POA: Insufficient documentation

## 2019-11-23 MED ORDER — ACETAMINOPHEN 500 MG PO TABS
1000.0000 mg | ORAL_TABLET | Freq: Once | ORAL | Status: AC
  Administered 2019-11-23: 1000 mg via ORAL
  Filled 2019-11-23: qty 2
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
## 2019-11-23 NOTE — Discharge Instructions (Addendum)
You can continue to take Tylenol for management of your pain.  Please follow the instructions on the bottle.  You can continue to wear the shoulder sling you are provided but please be sure to take your arm out of the sling from time to time and perform exercises with the left shoulder.  If you develop any new or worsening symptoms you need to return to the ER for reevaluation.  It was a pleasure to meet you.
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
## 2019-11-25 ENCOUNTER — Telehealth: Payer: Self-pay

## 2019-11-25 NOTE — Telephone Encounter (Signed)
New message   Pt c/o medication issue:  1. Name of Medication: metFORMIN (GLUCOPHAGE-XR) 500 MG 24 hr tablet  2. How are you currently taking this medication (dosage and times per day)? Three time a day   3. Are you having a reaction (difficulty breathing--STAT)? No   4. What is your medication issue?  Would like to discuss changing the direction of medication to twice a day due to work schedule.

## 2019-11-26 NOTE — Telephone Encounter (Signed)
Patient wanted to change Metformin to twice a day instead of three times a day. Please advise

## 2019-11-26 NOTE — Telephone Encounter (Signed)
Left a vm for patient to callback 

## 2019-11-26 NOTE — Telephone Encounter (Signed)
She can take 1 tablet in the morning and 2 tablets at dinnertime, prescription was actually written for 3 tablets once a day.  Confirm that she is not having any diarrhea from this

## 2019-11-27 ENCOUNTER — Telehealth: Payer: Self-pay | Admitting: Cardiology

## 2019-11-27 MED ORDER — CARVEDILOL 6.25 MG PO TABS
ORAL_TABLET | ORAL | 1 refills | Status: DC
Start: 2019-11-27 — End: 2019-12-25

## 2019-11-27 NOTE — Telephone Encounter (Signed)
Pt's medication was sent to pt's pharmacy as requested. Confirmation received.  °

## 2019-11-27 NOTE — Telephone Encounter (Signed)
Left another vm for patient to callback  

## 2019-11-27 NOTE — Telephone Encounter (Signed)
° ° ° °*  STAT* If patient is at the pharmacy, call can be transferred to refill team.   1. Which medications need to be refilled? (please list name of each medication and dose if known) carvedilol (COREG) 6.25 MG tablet  2. Which pharmacy/location (including street and city if local pharmacy) is medication to be sent to? [facility] [place] - [address]  3. Do they need a 30 day or 90 day supply? 30 days   Pt is out of medications, she made an appt with Vin Bhagat on 12/25/2019

## 2019-12-02 ENCOUNTER — Telehealth: Payer: Self-pay | Admitting: Endocrinology

## 2019-12-02 NOTE — Telephone Encounter (Signed)
Patient requests to be called at ph# [phone] re: Patient is unable to take Metformin 3 x per day. Patient states she is able to take Metformin 2 x per day. Patient requests to be called at the ph# listed above to be advised.

## 2019-12-02 NOTE — Telephone Encounter (Signed)
Pt last OV--pt calling regarding Metformin. Please advise

## 2019-12-02 NOTE — Telephone Encounter (Signed)
She can stay with 1 twice a day

## 2019-12-02 NOTE — Telephone Encounter (Signed)
Notified Dr. Dwyane Dee instructions. Pt understood without questions.

## 2019-12-10 ENCOUNTER — Ambulatory Visit: Payer: Managed Care, Other (non HMO) | Admitting: Orthopaedic Surgery

## 2019-12-11 ENCOUNTER — Ambulatory Visit (INDEPENDENT_AMBULATORY_CARE_PROVIDER_SITE_OTHER): Payer: Managed Care, Other (non HMO) | Admitting: Orthopaedic Surgery

## 2019-12-11 ENCOUNTER — Encounter: Payer: Self-pay | Admitting: Orthopaedic Surgery

## 2019-12-11 ENCOUNTER — Other Ambulatory Visit: Payer: Self-pay

## 2019-12-11 ENCOUNTER — Ambulatory Visit (INDEPENDENT_AMBULATORY_CARE_PROVIDER_SITE_OTHER): Payer: Managed Care, Other (non HMO)

## 2019-12-11 VITALS — Ht 64.0 in | Wt 242.0 lb

## 2019-12-11 DIAGNOSIS — M25512 Pain in left shoulder: Secondary | ICD-10-CM

## 2019-12-11 DIAGNOSIS — G8929 Other chronic pain: Secondary | ICD-10-CM

## 2019-12-11 DIAGNOSIS — M25511 Pain in right shoulder: Secondary | ICD-10-CM | POA: Insufficient documentation

## 2019-12-11 MED ORDER — BUPIVACAINE HCL 0.5 % IJ SOLN
2.0000 mL | INTRAMUSCULAR | Status: AC | PRN
  Administered 2019-12-11: 2 mL via INTRA_ARTICULAR

## 2019-12-11 MED ORDER — LIDOCAINE HCL 2 % IJ SOLN
2.0000 mL | INTRAMUSCULAR | Status: AC | PRN
  Administered 2019-12-11: 2 mL
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
## 2019-12-12 ENCOUNTER — Telehealth: Payer: Self-pay
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
## 2019-12-13 NOTE — Telephone Encounter (Signed)
Form done, pt is aware and she will pick it up

## 2019-12-23 ENCOUNTER — Other Ambulatory Visit (INDEPENDENT_AMBULATORY_CARE_PROVIDER_SITE_OTHER): Payer: Managed Care, Other (non HMO)

## 2019-12-23 ENCOUNTER — Other Ambulatory Visit: Payer: Self-pay

## 2019-12-23 DIAGNOSIS — E1169 Type 2 diabetes mellitus with other specified complication: Secondary | ICD-10-CM | POA: Diagnosis not present

## 2019-12-23 LAB — BASIC METABOLIC PANEL
BUN: 14 mg/dL (ref 6–23)
CO2: 27 mEq/L (ref 19–32)
Calcium: 9.5 mg/dL (ref 8.4–10.5)
Chloride: 102 mEq/L (ref 96–112)
Creatinine, Ser: 0.85 mg/dL (ref 0.40–1.20)
GFR: 72.11 mL/min (ref >60.00)
Glucose, Bld: 129 mg/dL — ABNORMAL HIGH (ref 70–99)
Potassium: 3.6 mEq/L (ref 3.5–5.1)
Sodium: 139 mEq/L (ref 135–145)

## 2019-12-24 LAB — FRUCTOSAMINE: Fructosamine: 219 umol/L (ref 0–285)

## 2019-12-25 ENCOUNTER — Encounter: Payer: Self-pay | Admitting: Physician Assistant

## 2019-12-25 ENCOUNTER — Ambulatory Visit: Payer: Medicare Other | Admitting: Endocrinology

## 2019-12-25 ENCOUNTER — Ambulatory Visit (INDEPENDENT_AMBULATORY_CARE_PROVIDER_SITE_OTHER): Payer: Medicare Other | Admitting: Physician Assistant

## 2019-12-25 ENCOUNTER — Telehealth: Payer: Self-pay | Admitting: Endocrinology

## 2019-12-25 ENCOUNTER — Other Ambulatory Visit: Payer: Self-pay

## 2019-12-25 ENCOUNTER — Encounter: Payer: Self-pay | Admitting: Endocrinology

## 2019-12-25 VITALS — BP 140/78 | HR 82 | Ht 64.0 in | Wt 243.8 lb

## 2019-12-25 DIAGNOSIS — Z9989 Dependence on other enabling machines and devices: Secondary | ICD-10-CM | POA: Diagnosis not present

## 2019-12-25 DIAGNOSIS — E1169 Type 2 diabetes mellitus with other specified complication: Secondary | ICD-10-CM

## 2019-12-25 DIAGNOSIS — I5042 Chronic combined systolic (congestive) and diastolic (congestive) heart failure: Secondary | ICD-10-CM | POA: Diagnosis not present

## 2019-12-25 DIAGNOSIS — G4733 Obstructive sleep apnea (adult) (pediatric): Secondary | ICD-10-CM

## 2019-12-25 DIAGNOSIS — Z23 Encounter for immunization: Secondary | ICD-10-CM

## 2019-12-25 DIAGNOSIS — I350 Nonrheumatic aortic (valve) stenosis: Secondary | ICD-10-CM | POA: Diagnosis not present

## 2019-12-25 DIAGNOSIS — I5022 Chronic systolic (congestive) heart failure: Secondary | ICD-10-CM | POA: Diagnosis not present

## 2019-12-25 DIAGNOSIS — I110 Hypertensive heart disease with heart failure: Secondary | ICD-10-CM | POA: Diagnosis not present

## 2019-12-25 MED ORDER — CARVEDILOL 6.25 MG PO TABS: ORAL_TABLET | ORAL | 1 refills | Status: DC

## 2019-12-25 MED ORDER — ENTRESTO 24-26 MG PO TABS
1.0000 | ORAL_TABLET | Freq: Two times a day (BID) | ORAL | 3 refills | Status: DC
Start: 2019-12-25 — End: 2020-03-18

## 2019-12-25 MED ORDER — SPIRONOLACTONE 25 MG PO TABS
ORAL_TABLET | ORAL | 3 refills | Status: DC
Start: 2019-12-25 — End: 2020-07-14

## 2019-12-25 NOTE — Telephone Encounter (Signed)
Please call patient to let her know that I reviewed her blood sugar monitor download and since her fasting readings are still relatively high I would like her to take 3 pills of Metformin ER at the same time with her main meal

## 2019-12-25 NOTE — Patient Instructions (Addendum)
Medication Instructions:  Your physician recommends that you continue on your current medications as directed. Please refer to the Current Medication list given to you today.  *If you need a refill on your cardiac medications before your next appointment, please call your pharmacy*   Lab Work: None ordered  If you have labs (blood work) drawn today and your tests are completely normal, you will receive your results only by: Marland Kitchen MyChart Message (if you have MyChart) OR . A paper copy in the mail If you have any lab test that is abnormal or we need to change your treatment, we will call you to review the results.   Testing/Procedures: None ordered   Follow-Up: At [facility], you and your health needs are our priority.  As part of our continuing mission to provide you with exceptional heart care, we have created designated Provider Care Teams.  These Care Teams include your primary Cardiologist (physician) and Advanced Practice Providers (APPs -  Physician Assistants and Nurse Practitioners) who all work together to provide you with the care you need, when you need it.  We recommend signing up for the patient portal called "MyChart".  Sign up information is provided on this After Visit Summary.  MyChart is used to connect with patients for Virtual Visits (Telemedicine).  Patients are able to view lab/test results, encounter notes, upcoming appointments, etc.  Non-urgent messages can be sent to your provider as well.   To learn more about what you can do with MyChart, go to [email or web address].    Your next appointment:   6 month(s)  The format for your next appointment:   In Person  Provider:   You may see Fransico Him, MD or one of the following Advanced Practice Providers on your designated Care Team:    Melina Copa, PA-C  Ermalinda Barrios, PA-C    Other Instructions

## 2019-12-25 NOTE — Progress Notes (Signed)
Cardiology Office Note:    Date:  12/25/2019   ID:  [name], DOB [birth year], MRN [id number]  PCP:  Joanna Frizzle, MD  [facility] Cardiologist:  Joanna Him, MD  [facility] Electrophysiologist:  None   Chief Complaint: 10 months follow up   History of Present Illness:    [name] is a 64 y.o. female with a hx of with a hx chronic combined CHF, HTN, LBBB, obesityand obstructive sleep apnea, on CPAP seen for follow up.   Stress test in 2015 with inf and ant wall defects and normal coronary arteries by cath EF 30-35% and severe pulmonary HTN (PASP 67/25mmHg) and NSVT noted on Tele at time of hospitalization for cath. She had a 2D echo in August 2018which showed mildly reduced LVF with EF 40-45% with diffuse HK. Mild AS. Mean AV gradient was 13 mmHg.She also has a history of PACs and PVCs by event monitor and chronic lower extremity edema.She lives a relatively sendenatary lifestyle. She works as a Recruitment consultant for [employer].  She was doing well when last seen by Dr. Radford Peck 04/2019.  Here today for follow-up with her significant other/friend.  No complaints.  No regular exercise but does loading and unloading of luggage.  Denies chest pain.  Chronic stable shortness of breath secondary to morbid obesity and deconditioning.  Denies orthopnea, PND, syncope, dizziness, palpitation, lower extremity edema or melena.  Compliant with her medication and low-sodium diet.   Past Medical History:  Diagnosis Date  . Anxiety   . Aortic stenosis, mild    echo 2017  . Breast cancer ([place]) 1996   left breast  . Carpal tunnel syndrome   . Cataract   . Chronic diastolic CHF (congestive heart failure) (HCC)    NYHA class II  . Diabetes mellitus without complication ([place])   . Hyperlipidemia   . Hypertension   . Left bundle branch block (LBBB)   . Low back pain   . Neuropathy   . NICM (nonischemic cardiomyopathy) ([place])    EF 30%, cath 05/20/2015 clean coronary.  EF resolved  by echo with EF 55%  . OSA on CPAP   . Prediabetes   . Pulmonary HTN ([place]) 05/2015   Severe with PASP 67/4mmHg - resolved on followup echo  . PVC's (premature ventricular contractions) 11/04/2015  . Sleep apnea    cpap    Past Surgical History:  Procedure Laterality Date  . BREAST SURGERY    . CARDIAC CATHETERIZATION N/A 05/20/2015   Procedure: Right/Left Heart Cath and Coronary Angiography;  Surgeon: Troy Sine, MD;  Location: [facility];  Service: Cardiovascular;  Laterality: N/A;  . CATARACT EXTRACTION W/ INTRAOCULAR LENS  IMPLANT, BILATERAL    . COLONOSCOPY  2018  . DILATION AND CURETTAGE OF UTERUS    . lumpectomy left breast    . POLYPECTOMY    . UTERINE FIBROID SURGERY      Current Medications: No outpatient medications have been marked as taking for the 12/25/19 encounter (Office Visit) with Leanor Kail, PA.     Allergies:   Jardiance [empagliflozin], Atorvastatin, Pravastatin sodium, and Sulfa antibiotics   Social History   Socioeconomic History  . Marital status: Significant Other    Spouse name: Not on file  . Number of children: Not on file  . Years of education: Not on file  . Highest education level: Not on file  Occupational History  . Occupation: Truck Geophysicist/field seismologist  Tobacco Use  . Smoking  status: Former Smoker    Types: Cigarettes    Quit date: 06/18/2009    Years since quitting: 10.5  . Smokeless tobacco: Never Used  Vaping Use  . Vaping Use: Never used  Substance and Sexual Activity  . Alcohol use: Yes    Comment: once every 2 months  . Drug use: No  . Sexual activity: Yes  Other Topics Concern  . Not on file  Social History Narrative  . Not on file   Social Determinants of Health   Financial Resource Strain:   . Difficulty of Paying Living Expenses: Not on file  Food Insecurity:   . Worried About Charity fundraiser in the Last Year: Not on file  . Ran Out of Food in the Last Year: Not on file  Transportation Needs:   . Lack  of Transportation (Medical): Not on file  . Lack of Transportation (Non-Medical): Not on file  Physical Activity:   . Days of Exercise per Week: Not on file  . Minutes of Exercise per Session: Not on file  Stress:   . Feeling of Stress : Not on file  Social Connections:   . Frequency of Communication with Friends and Family: Not on file  . Frequency of Social Gatherings with Friends and Family: Not on file  . Attends Religious Services: Not on file  . Active Member of Clubs or Organizations: Not on file  . Attends Archivist Meetings: Not on file  . Marital Status: Not on file     Family History: The patient's family history includes Alcohol abuse in her father; Arthritis in her mother; Cancer in her father and paternal grandmother; Colon cancer (age of onset: 39) in her mother; Colon polyps in her mother; Depression in her father and mother; Drug abuse in her brother; Early death in her maternal grandmother; Hearing loss in her mother; Hyperlipidemia in her father and mother; Hypertension in her father; Miscarriages / [place] in her mother. There is no history of Heart attack, Diabetes, Esophageal cancer, Rectal cancer, or Stomach cancer.    ROS:   Please see the history of present illness.    All other systems reviewed and are negative.   EKGs/Labs/Other Studies Reviewed:    The following studies were reviewed today:  Echo 09/04/2019 1. Left ventricular ejection fraction, by estimation, is 45 to 50%. Left  ventricular ejection fraction by 2D MOD biplane is 45.3 %. The left  ventricle has mildly decreased function. The left ventricle demonstrates  global hypokinesis. Left ventricular  diastolic parameters are consistent with Grade II diastolic dysfunction  (pseudonormalization). Elevated left atrial pressure.  2. Right ventricular systolic function is low normal. The right  ventricular size is normal. Tricuspid regurgitation signal is inadequate  for assessing PA  pressure.  3. The mitral valve is grossly normal. No evidence of mitral valve  regurgitation. No evidence of mitral stenosis.  4. The aortic valve is tricuspid. Aortic valve regurgitation is not  visualized. Mild aortic valve stenosis. Aortic valve mean gradient  measures 14.0 mmHg. Aortic valve Vmax measures 2.40 m/s.  5. The inferior vena cava is normal in size with greater than 50%  respiratory variability, suggesting right atrial pressure of 3 mmHg.   Comparison(s): No significant change from prior study. EF remains ~45-50%.  Mild aortic stenosis.   EKG:  EKG is ordered today.  EKG demonstrates normal sinus rhythm with chronic left bundle branch block and chronic ST/T wave changes inferiorly.  No acute changes identified.  Recent Labs: 08/23/2019: ALT 21; Hemoglobin 14.3; Platelets 361 12/23/2019: BUN 14; Creatinine, Ser 0.85; Potassium 3.6; Sodium 139  Recent Lipid Panel    Component Value Date/Time   CHOL 156 08/23/2019 1013   CHOL 150 09/13/2018 1000   TRIG 99 08/23/2019 1013   HDL 59 08/23/2019 1013   HDL 56 09/13/2018 1000   CHOLHDL 2.6 08/23/2019 1013   VLDL 14 03/21/2016 1056   LDLCALC 79 08/23/2019 1013     Physical Exam:    VS:  BP 140/78   Pulse 82   Ht 5\' 4"  (1.626 m)   Wt 243 lb 12.8 oz (110.6 kg)   SpO2 95%   BMI 41.85 kg/m     Wt Readings from Last 3 Encounters:  12/25/19 243 lb 12.8 oz (110.6 kg)  12/25/19 244 lb (110.7 kg)  12/11/19 242 lb (109.8 kg)     GEN:  Obese female  in no acute distress HEENT: Normal NECK: No JVD; No carotid bruits LYMPHATICS: No lymphadenopathy CARDIAC: RRR, no murmurs, rubs, gallops RESPIRATORY:  Clear to auscultation without rales, wheezing or rhonchi  ABDOMEN: Soft, non-tender, non-distended MUSCULOSKELETAL:  No edema; No deformity  SKIN: Warm and dry NEUROLOGIC:  Alert and oriented x 3 PSYCHIATRIC:  Normal affect   ASSESSMENT AND PLAN:    1. Chronic combined CHF -Most recent echocardiogram July 2021  showed stable LV function at 45 to 50% and grade 2 diastolic dysfunction. -Euvolemic. -Continue carvedilol 6.25 mg twice daily, Lasix 40 mg daily, Entresto 24/26 mg twice daily and spironolactone 12.5 mg daily.  2. OSA on CPAP -Compliant  3. HTN -Relatively stable -No change.  4. Aortic stenosis  - Mild by echo 08/2019 - Aortic valve mean gradient  measures 14.0 mmHg. Aortic valve Vmax measures 2.40 m/s.   5.  Surgical clearance -Patient is dealing with bilateral knee pain.  Patient reports she is thinking about left knee surgery in the near future.  No regular exercise but able to get at least 4 METS of activity.  Recent echocardiogram reassuring.  She will be cleared at acceptable risk if required surgery in the near future.  Medication Adjustments/Labs and Tests Ordered: Current medicines are reviewed at length with the patient today.  Concerns regarding medicines are outlined above.  Orders Placed This Encounter  Procedures  . EKG 12-Lead   Meds ordered this encounter  Medications  . carvedilol (COREG) 6.25 MG tablet    Sig: TAKE 1 TABLET BY MOUTH TWICE DAILY WITH A MEAL.    Dispense:  180 tablet    Refill:  1  . sacubitril-valsartan (ENTRESTO) 24-26 MG    Sig: Take 1 tablet by mouth 2 (two) times daily.    Dispense:  180 tablet    Refill:  3  . spironolactone (ALDACTONE) 25 MG tablet    Sig: Take 1/2 (one-half) tablet by mouth once daily    Dispense:  45 tablet    Refill:  3    Patient Instructions  Medication Instructions:  Your physician recommends that you continue on your current medications as directed. Please refer to the Current Medication list given to you today.  *If you need a refill on your cardiac medications before your next appointment, please call your pharmacy*   Lab Work: None ordered  If you have labs (blood work) drawn today and your tests are completely normal, you will receive your results only by: Marland Kitchen MyChart Message (if you have MyChart)  OR . A paper copy in the mail  If you have any lab test that is abnormal or we need to change your treatment, we will call you to review the results.   Testing/Procedures: None ordered   Follow-Up: At [facility], you and your health needs are our priority.  As part of our continuing mission to provide you with exceptional heart care, we have created designated Provider Care Teams.  These Care Teams include your primary Cardiologist (physician) and Advanced Practice Providers (APPs -  Physician Assistants and Nurse Practitioners) who all work together to provide you with the care you need, when you need it.  We recommend signing up for the patient portal called "MyChart".  Sign up information is provided on this After Visit Summary.  MyChart is used to connect with patients for Virtual Visits (Telemedicine).  Patients are able to view lab/test results, encounter notes, upcoming appointments, etc.  Non-urgent messages can be sent to your provider as well.   To learn more about what you can do with MyChart, go to [email or web address].    Your next appointment:   6 month(s)  The format for your next appointment:   In Person  Provider:   You may see Joanna Him, MD or one of the following Advanced Practice Providers on your designated Care Team:    Melina Copa, PA-C  Ermalinda Barrios, PA-C    Other Instructions      Signed, Leanor Kail, [place]  12/25/2019 3:28 PM    [place]

## 2019-12-25 NOTE — Progress Notes (Signed)
Patient ID: [name], female   DOB: 06-06-1955, 64 y.o.   MRN: [id number]           Reason for Appointment: Follow-up for Type 2 Diabetes   History of Present Illness:          Date of diagnosis of type 2 diabetes mellitus: 09/2017       Background history:   Her diabetes was diagnosed with an A1c of 6.8 Review of her records indicate that probably that her highest fasting blood sugar has been only 130 outside the hospital settings  Recent history:   Most recent A1c is 7.1, previously was 6.4 done on 08/23/2019 Fructosamine is now 219  Non-insulin hypoglycemic drugs the patient is taking are: Metformin ER 500 mg twice daily  Current management, blood sugar patterns and problems identified:  She was able to take Metformin but she was trying to take 1 tablet with each meal and could not remember to do this  She is mostly taking 2 pills a day although not clear if she misses one of her doses because of her work schedule  She has seen the dietitian about a month ago  Has cut back on sweet tea and regular soft drinks  However has not lost any weight  Has not done many readings after meals but still has relatively high fasting readings, do not appear to be improved compared to the last visit  Highest blood sugar in the morning was 138, she thinks she had some sweet item the night before  She was told to try doing water exercises but cannot do so because of her work schedule        Side effects from medications have been: Vaginitis from [place]               Exercise:  minimal  Glucose monitoring:  Using One Touch Verio, last 30-day data:   PRE-MEAL Fasting  midday Dinner Bedtime Overall  Glucose range:  109-138  93-128  121    Mean/median:  122     114    AVERAGE blood sugar 117 overall with blood sugar range 90-142 FASTING average about 120     Weight history: Highest 262   Wt Readings from Last 3 Encounters:  12/25/19 244 lb (110.7 kg)  12/11/19 242 lb  (109.8 kg)  11/23/19 242 lb (109.8 kg)    Glycemic control:   Lab Results  Component Value Date   HGBA1C 7.1 (H) 10/30/2019   HGBA1C 6.4 (H) 08/23/2019   HGBA1C 6.7 (H) 06/04/2019   Lab Results  Component Value Date   MICROALBUR 0.8 08/23/2019   LDLCALC 79 08/23/2019   CREATININE 0.85 12/23/2019   No results found for: [facility]  Lab Results  Component Value Date   FRUCTOSAMINE 219 12/23/2019      Allergies as of 12/25/2019      Reactions   Jardiance [empagliflozin] Anaphylaxis, Itching, Rash, Other (See Comments)   Yeast infection   Atorvastatin Other (See Comments)   REACTION: mylagias   Pravastatin Sodium Other (See Comments)   REACTION: myalgias   Sulfa Antibiotics Hives      Medication List       Accurate as of December 25, 2019 11:14 AM. If you have any questions, ask your nurse or doctor.        aspirin 81 MG tablet Take 81 mg by mouth daily.   carvedilol 6.25 MG tablet Commonly known as: COREG TAKE 1 TABLET BY MOUTH TWICE DAILY  WITH A MEAL. Please keep upcoming appt in November for future refills. Thank you   cholecalciferol 1000 units tablet Commonly known as: VITAMIN D Take 1,000 Units by mouth daily.   clotrimazole 1 % cream Commonly known as: Lotrimin AF Apply 1 application topically in the morning, at noon, and at bedtime.   Entresto 24-26 MG Generic drug: sacubitril-valsartan Take 1 tablet by mouth twice daily   ezetimibe 10 MG tablet Commonly known as: ZETIA Take 1 tablet (10 mg total) by mouth daily.   furosemide 40 MG tablet Commonly known as: LASIX TAKE ONE TO TWO TABLETS BY MOUTH DAILY AS DIRECTED   gabapentin 100 MG capsule Commonly known as: NEURONTIN Take 1 capsule (100 mg total) by mouth 2 (two) times daily.   glucose blood test strip Use OneTouch Verio Test strips to check blood sugar 3 times weekly, alternatiing between fasting and 2 hours after meals.   Melatonin 10 MG Tabs Take by mouth at bedtime.    metFORMIN 500 MG 24 hr tablet Commonly known as: GLUCOPHAGE-XR Take 3 tablets (1,500 mg total) by mouth daily with supper.   multivitamin tablet Take 1 tablet by mouth daily.   nystatin-triamcinolone ointment Commonly known as: MYCOLOG Apply 1 application topically 3 (three) times daily.   OneTouch Delica Lancets 40C Misc Use OneTouch Delica lancets to check blood sugar 3 times weekly, alternating between fasting and 2 hours after meals.   OneTouch Verio Flex System w/Device Kit Use onetouch verio flex meter to check blood sugar 3 times weekly, alternating between fasting and 2 hours after meals.   rosuvastatin 5 MG tablet Commonly known as: CRESTOR Take 1 tablet by mouth once daily   spironolactone 25 MG tablet Commonly known as: ALDACTONE Take 1/2 (one-half) tablet by mouth once daily   Tylenol 8 Hour Arthritis Pain 650 MG CR tablet Generic drug: acetaminophen Take 1,300 mg by mouth as needed for pain.   vitamin C 500 MG tablet Commonly known as: ASCORBIC ACID Take 2,000 mg by mouth as needed.       Allergies:  Allergies  Allergen Reactions   Jardiance [Empagliflozin] Anaphylaxis, Itching, Rash and Other (See Comments)    Yeast infection   Atorvastatin Other (See Comments)    REACTION: mylagias   Pravastatin Sodium Other (See Comments)    REACTION: myalgias   Sulfa Antibiotics Hives    Past Medical History:  Diagnosis Date   Anxiety    Aortic stenosis, mild    echo 2017   Breast cancer ([place]) 1996   left breast   Carpal tunnel syndrome    Cataract    Chronic diastolic CHF (congestive heart failure) (HCC)    NYHA class II   Diabetes mellitus without complication (HCC)    Hyperlipidemia    Hypertension    Left bundle branch block (LBBB)    Low back pain    Neuropathy    NICM (nonischemic cardiomyopathy) ([place])    EF 30%, cath 05/20/2015 clean coronary.  EF resolved by echo with EF 55%   OSA on CPAP    Prediabetes    Pulmonary HTN  ([place]) 05/2015   Severe with PASP 67/24mHg - resolved on followup echo   PVC's (premature ventricular contractions) 11/04/2015   Sleep apnea    cpap    Past Surgical History:  Procedure Laterality Date   BREAST SURGERY     CARDIAC CATHETERIZATION N/A 05/20/2015   Procedure: Right/Left Heart Cath and Coronary Angiography;  Surgeon: TTroy Sine MD;  Location: [place] [facility];  Service: Cardiovascular;  Laterality: N/A;   CATARACT EXTRACTION W/ INTRAOCULAR LENS  IMPLANT, BILATERAL     COLONOSCOPY  2018   DILATION AND CURETTAGE OF UTERUS     lumpectomy left breast     POLYPECTOMY     UTERINE FIBROID SURGERY      Family History  Problem Relation Age of Onset   Arthritis Mother    Depression Mother    Hearing loss Mother    Hyperlipidemia Mother    Miscarriages / [place] Mother    Colon cancer Mother 80       2010   Colon polyps Mother    Alcohol abuse Father    Cancer Father        PROSTATE   Depression Father    Hypertension Father    Hyperlipidemia Father    Drug abuse Brother    Early death Maternal Grandmother    Cancer Paternal Grandmother    Heart attack Neg Hx    Diabetes Neg Hx    Esophageal cancer Neg Hx    Rectal cancer Neg Hx    Stomach cancer Neg Hx     Social History:  reports that she quit smoking about 10 years ago. Her smoking use included cigarettes. She has never used smokeless tobacco. She reports current alcohol use. She reports that she does not use drugs.   Review of Systems    Lipid history: She has been treated with Crestor and Zetia by her PCP with the following results    Lab Results  Component Value Date   CHOL 156 08/23/2019   HDL 59 08/23/2019   LDLCALC 79 08/23/2019   TRIG 99 08/23/2019   CHOLHDL 2.6 08/23/2019        CARDIAC: Last ejection fraction on echo 55%     Hypertension: Has been present, is on low-dose Coreg and spironolactone as well as Entresto  BP Readings from Last 3  Encounters:  12/25/19 130/82  11/23/19 120/74  11/06/19 112/64    Most recent eye exam was in 2021  Most recent foot exam: 8/21   LABS:  Lab on 12/23/2019  Component Date Value Ref Range Status   Sodium 12/23/2019 139  135 - 145 mEq/L Final   Potassium 12/23/2019 3.6  3.5 - 5.1 mEq/L Final   Chloride 12/23/2019 102  96 - 112 mEq/L Final   CO2 12/23/2019 27  19 - 32 mEq/L Final   Glucose, Bld 12/23/2019 129* 70 - 99 mg/dL Final   BUN 12/23/2019 14  6 - 23 mg/dL Final   Creatinine, Ser 12/23/2019 0.85  0.40 - 1.20 mg/dL Final   GFR 12/23/2019 72.11  >60.00 mL/min Final   Calculated using the CKD-EPI Creatinine Equation (2021)   Calcium 12/23/2019 9.5  8.4 - 10.5 mg/dL Final   Fructosamine 12/23/2019 219  0 - 285 umol/L Final   Comment: Published reference interval for apparently healthy subjects between age 63 and 79 is 33 - 285 umol/L and in a poorly controlled diabetic population is 228 - 563 umol/L with a mean of 396 umol/L.     Physical Examination:  BP 130/82    Pulse 74    Ht _0  (1.626 m)    Wt 244 lb (110.7 kg)    SpO2 97%    BMI 41.88 kg/m       ASSESSMENT:  Diabetes type 2 with BMI over 40  See history of present illness for detailed discussion of current  diabetes assessment and problems identified  A1c was last 7.1  Fructosamine of 219 indicates good control on current regimen of only 1000 mg Metformin However she still has relatively high fasting readings and likely A1c may not be better with her home blood sugars looking about the same as before She is also having difficulty losing weight She is a good candidate for GLP-1 drug but likely cannot afford a brand-name medication   PLAN:    She will try to take Metformin ER 500 mg all at once and can go up to 3 tablets now to get better efficacy Encouraged her to start doing chair exercises at home Follow instructions from dietitian Check blood sugar rotation at different  times  Influenza vaccine given   There are no Patient Instructions on file for this visit.     Elayne Snare 12/25/2019, 11:14 AM   Note: This office note was prepared with Dragon voice recognition system technology. Any transcriptional errors that result from this process are unintentional.

## 2019-12-26 ENCOUNTER — Ambulatory Visit (INDEPENDENT_AMBULATORY_CARE_PROVIDER_SITE_OTHER): Payer: Medicare Other | Admitting: Orthopaedic Surgery

## 2019-12-26 ENCOUNTER — Encounter: Payer: Self-pay | Admitting: Orthopaedic Surgery

## 2019-12-26 DIAGNOSIS — M25511 Pain in right shoulder: Secondary | ICD-10-CM | POA: Diagnosis not present

## 2019-12-26 DIAGNOSIS — M25512 Pain in left shoulder: Secondary | ICD-10-CM

## 2019-12-26 DIAGNOSIS — G8929 Other chronic pain: Secondary | ICD-10-CM | POA: Diagnosis not present

## 2019-12-26 MED ORDER — LIDOCAINE HCL 2 % IJ SOLN
2.0000 mL | INTRAMUSCULAR | Status: AC | PRN
  Administered 2019-12-26: 2 mL

## 2019-12-26 MED ORDER — BUPIVACAINE HCL 0.5 % IJ SOLN
2.0000 mL | INTRAMUSCULAR | Status: AC | PRN
  Administered 2019-12-26: 2 mL via INTRA_ARTICULAR

## 2019-12-26 MED ORDER — METHYLPREDNISOLONE ACETATE 80 MG/ML IJ SUSP
80.0000 mg | INTRAMUSCULAR | Status: AC | PRN
  Administered 2019-12-26: 80 mg via INTRA_ARTICULAR

## 2019-12-26 NOTE — Telephone Encounter (Signed)
Noted, patient is aware. 

## 2019-12-26 NOTE — Progress Notes (Signed)
Office Visit Note   Patient: [name]           Date of Birth: 06/09/1955           MRN: [id number] Visit Date: 12/26/2019              Requested by: Susy Frizzle, MD [address] [place],  [place] [id number] PCP: Susy Frizzle, MD   Assessment & Plan: Visit Diagnoses:  1. Chronic pain of both shoulders     Plan: Ms Caley was seen 2 weeks ago for evaluation of bilateral shoulder pain.  Films demonstrated ectopic calcification in the area of the greater tuberosity consistent with chronic calcific tendinitis.  I injected her left shoulder and she notes it is "miraculous" her relief.  She like to have a cortisone injection in her right shoulder.  Follow-Up Instructions: Return if symptoms worsen or fail to improve.   Orders:  No orders of the defined types were placed in this encounter.  No orders of the defined types were placed in this encounter.     Procedures: Large Joint Inj: R subacromial bursa on 12/26/2019 1:36 PM Indications: pain and diagnostic evaluation Details: 25 G 1.5 in needle, anterolateral approach  Arthrogram: No  Medications: 2 mL lidocaine 2 %; 2 mL bupivacaine 0.5 %; 80 mg methylPREDNISolone acetate 80 MG/ML Consent was given by the patient. Immediately prior to procedure a time out was called to verify the correct patient, procedure, equipment, support staff and site/side marked as required. Patient was prepped and draped in the usual sterile fashion.       Clinical Data: No additional findings.   Subjective: Chief Complaint  Patient presents with  . Right Shoulder - Pain  Seen 2 weeks ago for bilateral shoulder pain with x-rays demonstrating areas of ectopic calcification in the area of the supraspinatus tendon attachment to the greater tuberosity.  I injected the left shoulder with cortisone and she notes it made a huge difference.  She would like to have a cortisone injection on the right today  HPI  Review of  Systems   Objective: Vital Signs: There were no vitals taken for this visit.  Physical Exam Constitutional:      Appearance: She is well-developed.  Eyes:     Pupils: Pupils are equal, round, and reactive to light.  Pulmonary:     Effort: Pulmonary effort is normal.  Skin:    General: Skin is warm and dry.  Neurological:     Mental Status: She is alert and oriented to person, place, and time.  Psychiatric:        Behavior: Behavior normal.     Ortho Exam painless range of motion left shoulder in all planes.  No localized areas of tenderness.  Right shoulder had some discomfort with overhead motion and local tenderness over the greater tuberosity.  No crepitation.  Good grip and good release  Specialty Comments:  No specialty comments available.  Imaging: No results found.   PMFS History: Patient Active Problem List   Diagnosis Date Noted  . Shoulder pain, bilateral 12/11/2019  . Influenza A 11/07/2016  . Prediabetes 11/07/2016  . PVC's (premature ventricular contractions) 11/04/2015  . Aortic stenosis, mild   . Heart palpitations 07/28/2015  . Morbid obesity ([place]) 05/23/2015  . Hypertensive heart disease   . Nonischemic cardiomyopathy ([place])   . NSVT (nonsustained ventricular tachycardia) ([place]) 05/21/2015  . Chronic diastolic heart failure ([place]) 05/20/2015  . OSA (obstructive  sleep apnea) 01/02/2014  . LBBB (left bundle branch block) 01/02/2014  . Mixed hyperlipidemia 04/15/2010  . Personal history of malignant neoplasm of breast 04/15/2010   Past Medical History:  Diagnosis Date  . Anxiety   . Aortic stenosis, mild    echo 2017  . Breast cancer ([place]) 1996   left breast  . Carpal tunnel syndrome   . Cataract   . Chronic diastolic CHF (congestive heart failure) (HCC)    NYHA class II  . Diabetes mellitus without complication ([place])   . Hyperlipidemia   . Hypertension   . Left bundle branch block (LBBB)   . Low back pain   . Neuropathy   . NICM  (nonischemic cardiomyopathy) ([place])    EF 30%, cath 05/20/2015 clean coronary.  EF resolved by echo with EF 55%  . OSA on CPAP   . Prediabetes   . Pulmonary HTN ([place]) 05/2015   Severe with PASP 67/31mmHg - resolved on followup echo  . PVC's (premature ventricular contractions) 11/04/2015  . Sleep apnea    cpap    Family History  Problem Relation Age of Onset  . Arthritis Mother   . Depression Mother   . Hearing loss Mother   . Hyperlipidemia Mother   . Miscarriages / [place] Mother   . Colon cancer Mother 28       2010  . Colon polyps Mother   . Alcohol abuse Father   . Cancer Father        PROSTATE  . Depression Father   . Hypertension Father   . Hyperlipidemia Father   . Drug abuse Brother   . Early death Maternal Grandmother   . Cancer Paternal Grandmother   . Heart attack Neg Hx   . Diabetes Neg Hx   . Esophageal cancer Neg Hx   . Rectal cancer Neg Hx   . Stomach cancer Neg Hx     Past Surgical History:  Procedure Laterality Date  . BREAST SURGERY    . CARDIAC CATHETERIZATION N/A 05/20/2015   Procedure: Right/Left Heart Cath and Coronary Angiography;  Surgeon: Troy Sine, MD;  Location: [place] [facility];  Service: Cardiovascular;  Laterality: N/A;  . CATARACT EXTRACTION W/ INTRAOCULAR LENS  IMPLANT, BILATERAL    . COLONOSCOPY  2018  . DILATION AND CURETTAGE OF UTERUS    . lumpectomy left breast    . POLYPECTOMY    . UTERINE FIBROID SURGERY     Social History   Occupational History  . Occupation: Truck Geophysicist/field seismologist  Tobacco Use  . Smoking status: Former Smoker    Types: Cigarettes    Quit date: 06/18/2009    Years since quitting: 10.5  . Smokeless tobacco: Never Used  Vaping Use  . Vaping Use: Never used  Substance and Sexual Activity  . Alcohol use: Yes    Comment: once every 2 months  . Drug use: No  . Sexual activity: Yes     Garald Balding, MD   Note - This record has been created using [employer].  Chart creation errors have been  sought, but may not always  have been located. Such creation errors do not reflect on  the standard of medical care.

## 2019-12-27 ENCOUNTER — Other Ambulatory Visit (HOSPITAL_BASED_OUTPATIENT_CLINIC_OR_DEPARTMENT_OTHER): Payer: Self-pay | Admitting: Internal Medicine

## 2019-12-27 ENCOUNTER — Ambulatory Visit: Payer: Managed Care, Other (non HMO) | Attending: Internal Medicine

## 2019-12-27 DIAGNOSIS — Z23 Encounter for immunization: Secondary | ICD-10-CM

## 2019-12-27 NOTE — Progress Notes (Signed)
   Covid-19 Vaccination Clinic  Name:  [name]    MRN: [id number] DOB: 03/14/1955  12/27/2019  [name] was observed post Covid-19 immunization for 15 minutes without incident. She was provided with Vaccine Information Sheet and instruction to access the V-Safe system.   [name] was instructed to call 911 with any severe reactions post vaccine: Marland Kitchen Difficulty breathing  . Swelling of face and throat  . A fast heartbeat  . A bad rash all over body  . Dizziness and weakness

## 2019-12-30 MED FILL — PFIZER-BIONTECH COVID-19 VA: 30 | 1 days supply | Qty: 0 | Fill #0

## 2020-01-01 ENCOUNTER — Ambulatory Visit: Payer: Managed Care, Other (non HMO) | Admitting: Endocrinology

## 2020-01-17 ENCOUNTER — Other Ambulatory Visit: Payer: Self-pay | Admitting: Family Medicine

## 2020-02-13 ENCOUNTER — Other Ambulatory Visit: Payer: Self-pay

## 2020-02-17 ENCOUNTER — Other Ambulatory Visit: Payer: Medicare Other

## 2020-02-17 DIAGNOSIS — Z20822 Contact with and (suspected) exposure to covid-19: Secondary | ICD-10-CM

## 2020-02-18 LAB — SARS-COV-2, NAA 2 DAY TAT

## 2020-02-18 LAB — NOVEL CORONAVIRUS, NAA: SARS-CoV-2, NAA: DETECTED — AB

## 2020-02-21 ENCOUNTER — Encounter: Payer: Self-pay | Admitting: Nurse Practitioner

## 2020-02-21 ENCOUNTER — Telehealth (INDEPENDENT_AMBULATORY_CARE_PROVIDER_SITE_OTHER): Payer: Medicare Other | Admitting: Nurse Practitioner

## 2020-02-21 ENCOUNTER — Other Ambulatory Visit: Payer: Self-pay

## 2020-02-21 VITALS — BP 131/74 | HR 85 | Temp 95.7°F

## 2020-02-21 DIAGNOSIS — R059 Cough, unspecified: Secondary | ICD-10-CM | POA: Diagnosis not present

## 2020-02-21 DIAGNOSIS — U071 COVID-19: Secondary | ICD-10-CM | POA: Diagnosis not present

## 2020-02-21 MED ORDER — AZITHROMYCIN 250 MG PO TABS: ORAL_TABLET | ORAL | 0 refills | Status: DC

## 2020-02-21 NOTE — Assessment & Plan Note (Signed)
Symptoms started 02/09/2020.  With persistent cough with some sputum production.  Azithromycin for ongoing cough; cannot rule out pneumonia.  Otherwise, continue with supportive care.  With any sudden onset new chest pain, dizziness, sweating, or shortness of breath, go to ED.

## 2020-02-21 NOTE — Progress Notes (Signed)
Subjective:    Patient ID: [name], female    DOB: 12/31/1955, 65 y.o.   MRN: [id number]  HPI: [name] is a 65 y.o. female presenting virtually for COVID-19 illness.  Chief Complaint  Patient presents with  . Follow-up    Covid Test- Positive   UPPER RESPIRATORY TRACT INFECTION Onset: 02/09/2020 Worst symptom: cough, Fever: no Cough: yes; congested cough with clear phlegm Shortness of breath: no Wheezing: no Chest pain: no Chest tightness: no Chest congestion: no Nasal congestion: yes Runny nose: yes Post nasal drip: yes Sneezing: no Sore throat: no Swollen glands: no Sinus pressure: yes Headache: yes  Face pain: yes Toothache: no Ear pain: no  Ear pressure: no  Eyes red/itching:no Eye drainage/crusting: no  Nausea: no  Vomiting: no Diarrhea: no  Change in appetite: no  Loss of taste/smell: no  Rash: no Fatigue: yes Sick contacts: no Strep contacts: no  Context: better Recurrent sinusitis: no Treatments attempted: Tylenol,  Relief with OTC medications:  Allergies  Allergen Reactions  . Jardiance [Empagliflozin] Anaphylaxis, Itching, Rash and Other (See Comments)    Yeast infection  . Atorvastatin Other (See Comments)    REACTION: mylagias  . Pravastatin Sodium Other (See Comments)    REACTION: myalgias  . Sulfa Antibiotics Hives    Outpatient Encounter Medications as of 02/21/2020  Medication Sig  . azithromycin (ZITHROMAX) 250 MG tablet Take 2 tablets (500 mg) on day 1 and then 1 tablet (250 mg) days 2 to 5.  Marland Kitchen acetaminophen (TYLENOL 8 HOUR ARTHRITIS PAIN) 650 MG CR tablet Take 1,300 mg by mouth as needed for pain.  Marland Kitchen aspirin 81 MG tablet Take 81 mg by mouth daily.  . Blood Glucose Monitoring Suppl (ONETOUCH VERIO FLEX SYSTEM) w/Device KIT Use onetouch verio flex meter to check blood sugar 3 times weekly, alternating between fasting and 2 hours after meals.  . carvedilol (COREG) 6.25 MG tablet TAKE 1 TABLET BY MOUTH TWICE DAILY WITH  A MEAL.  . cholecalciferol (VITAMIN D) 1000 units tablet Take 1,000 Units by mouth daily.  . clotrimazole (LOTRIMIN AF) 1 % cream Apply 1 application topically in the morning, at noon, and at bedtime.  Marland Kitchen ezetimibe (ZETIA) 10 MG tablet Take 1 tablet (10 mg total) by mouth daily.  . furosemide (LASIX) 40 MG tablet TAKE ONE TO TWO TABLETS BY MOUTH DAILY AS DIRECTED  . gabapentin (NEURONTIN) 100 MG capsule Take 1 capsule (100 mg total) by mouth 2 (two) times daily. (Patient not taking: Reported on 12/25/2019)  . glucose blood test strip Use OneTouch Verio Test strips to check blood sugar 3 times weekly, alternatiing between fasting and 2 hours after meals.  . Melatonin 10 MG TABS Take by mouth at bedtime.   . metFORMIN (GLUCOPHAGE-XR) 500 MG 24 hr tablet Take 3 tablets (1,500 mg total) by mouth daily with supper.  . Multiple Vitamin (MULTIVITAMIN) tablet Take 1 tablet by mouth daily.   Marland Kitchen nystatin-triamcinolone ointment (MYCOLOG) Apply 1 application topically 3 (three) times daily.  Glory Rosebush Delica Lancets 47M MISC Use OneTouch Delica lancets to check blood sugar 3 times weekly, alternating between fasting and 2 hours after meals.  . rosuvastatin (CRESTOR) 5 MG tablet Take 1 tablet by mouth once daily  . sacubitril-valsartan (ENTRESTO) 24-26 MG Take 1 tablet by mouth 2 (two) times daily.  Marland Kitchen spironolactone (ALDACTONE) 25 MG tablet Take 1/2 (one-half) tablet by mouth once daily  . vitamin C (ASCORBIC ACID) 500 MG tablet Take 2,000  mg by mouth as needed.    No facility-administered encounter medications on file as of 02/21/2020.    Patient Active Problem List   Diagnosis Date Noted  . Shoulder pain, bilateral 12/11/2019  . Influenza A 11/07/2016  . Prediabetes 11/07/2016  . PVC's (premature ventricular contractions) 11/04/2015  . Aortic stenosis, mild   . Heart palpitations 07/28/2015  . Morbid obesity (HCC) 05/23/2015  . Hypertensive heart disease   . Nonischemic cardiomyopathy (HCC)   . NSVT  (nonsustained ventricular tachycardia) (HCC) 05/21/2015  . Chronic diastolic heart failure (HCC) 05/20/2015  . OSA (obstructive sleep apnea) 01/02/2014  . LBBB (left bundle branch block) 01/02/2014  . Mixed hyperlipidemia 04/15/2010  . Personal history of malignant neoplasm of breast 04/15/2010    Past Medical History:  Diagnosis Date  . Anxiety   . Aortic stenosis, mild    echo 2017  . Breast cancer (HCC) 1996   left breast  . Carpal tunnel syndrome   . Cataract   . Chronic diastolic CHF (congestive heart failure) (HCC)    NYHA class II  . Diabetes mellitus without complication (HCC)   . Hyperlipidemia   . Hypertension   . Left bundle branch block (LBBB)   . Low back pain   . Neuropathy   . NICM (nonischemic cardiomyopathy) (HCC)    EF 30%, cath 05/20/2015 clean coronary.  EF resolved by echo with EF 55%  . OSA on CPAP   . Prediabetes   . Pulmonary HTN (HCC) 05/2015   Severe with PASP 67/37mmHg - resolved on followup echo  . PVC's (premature ventricular contractions) 11/04/2015  . Sleep apnea    cpap    Relevant past medical, surgical, family and social history reviewed and updated as indicated. Interim medical history since our last visit reviewed.  Review of Systems  Constitutional: Negative for activity change, appetite change, fatigue and fever.  HENT: Positive for congestion, postnasal drip, sinus pressure and sinus pain. Negative for ear pain, rhinorrhea, sneezing and sore throat.   Eyes: Negative.  Negative for pain, discharge, redness and itching.  Respiratory: Positive for cough. Negative for chest tightness, shortness of breath and wheezing.   Cardiovascular: Negative.  Negative for chest pain.  Gastrointestinal: Negative.   Skin: Negative.  Negative for rash.  Neurological: Positive for headaches.  Psychiatric/Behavioral: Negative.     Per HPI unless specifically indicated above     Objective:    BP 131/74   Pulse 85   Temp (!) 95.7 F (35.4 C)   Wt  Readings from Last 3 Encounters:  12/25/19 243 lb 12.8 oz (110.6 kg)  12/25/19 244 lb (110.7 kg)  12/11/19 242 lb (109.8 kg)    Physical Exam Vitals and nursing note reviewed.  Constitutional:      General: She is not in acute distress.    Appearance: Normal appearance. She is not toxic-appearing.  HENT:     Head: Normocephalic and atraumatic.     Right Ear: External ear normal.     Left Ear: External ear normal.     Nose: Nose normal. No congestion.     Mouth/Throat:     Mouth: Mucous membranes are moist.     Pharynx: Oropharynx is clear.  Eyes:     General: No scleral icterus.    Extraocular Movements: Extraocular movements intact.  Cardiovascular:     Rate and Rhythm: Normal rate.  Pulmonary:     Effort: Pulmonary effort is normal. No respiratory distress.     Comments:   Unable to assess lung sounds via virtual visit; patient talking in complete sentences without accessory muscle use Skin:    Coloration: Skin is not jaundiced or pale.     Findings: No erythema.  Neurological:     Mental Status: She is alert and oriented to person, place, and time.  Psychiatric:        Mood and Affect: Mood normal.        Behavior: Behavior normal.        Thought Content: Thought content normal.        Judgment: Judgment normal.     Results for orders placed or performed in visit on 02/17/20  Novel Coronavirus, NAA ([facility])   Specimen: Nasopharyngeal(NP) swabs in vial transport medium   Nasopharynge  Screenin  Result Value Ref Range   SARS-CoV-2, NAA Detected (A) Not Detected  SARS-COV-2, NAA 2 DAY TAT   Nasopharynge  Screenin  Result Value Ref Range   SARS-CoV-2, NAA 2 DAY TAT Performed       Assessment & Plan:   Problem List Items Addressed This Visit   None   Visit Diagnoses    COVID-19    -  Primary   Relevant Medications   azithromycin (ZITHROMAX) 250 MG tablet   Cough           Follow up plan: Return if symptoms worsen or fail to improve.  Due to the  catastrophic nature of the COVID-19 pandemic, this visit was completed via audio and visual contact via [facility] due to the restrictions of the COVID-19 pandemic. All issues as above were discussed and addressed. Physical exam was done as above through visual confirmation on [employer]. If it was felt that the patient should be evaluated in the office, they were directed there. The patient verbally consented to this visit."} . Location of the patient: home . Location of the provider: work . Those involved with this call:  . Provider: Jessica Asaro, DNP . CMA: Jo Keith, CMA . Front Desk/Registration: Michelle Ingram  . Time spent on call: 30 minutes with patient face to face via video conference. More than 50% of this time was spent in counseling and coordination of care. 30 minutes total spent in review of patient's record and preparation of their chart.  I verified patient identity using two factors (patient name and date of birth). Patient consents verbally to being seen via telemedicine visit today. 

## 2020-02-21 NOTE — Patient Instructions (Signed)
COVID-19 COVID-19 is a respiratory infection that is caused by a virus called severe acute respiratory syndrome coronavirus 2 (SARS-CoV-2). The disease is also known as coronavirus disease or novel coronavirus. In some people, the virus may not cause any symptoms. In others, it may cause a serious infection. The infection can get worse quickly and can lead to complications, such as:  Pneumonia, or infection of the lungs.  Acute respiratory distress syndrome or ARDS. This is a condition in which fluid build-up in the lungs prevents the lungs from filling with air and passing oxygen into the blood.  Acute respiratory failure. This is a condition in which there is not enough oxygen passing from the lungs to the body or when carbon dioxide is not passing from the lungs out of the body.  Sepsis or septic shock. This is a serious bodily reaction to an infection.  Blood clotting problems.  Secondary infections due to bacteria or fungus.  Organ failure. This is when your body's organs stop working. The virus that causes COVID-19 is contagious. This means that it can spread from person to person through droplets from coughs and sneezes (respiratory secretions). What are the causes? This illness is caused by a virus. You may catch the virus by:  Breathing in droplets from an infected person. Droplets can be spread by a person breathing, speaking, singing, coughing, or sneezing.  Touching something, like a table or a doorknob, that was exposed to the virus (contaminated) and then touching your mouth, nose, or eyes. What increases the risk? Risk for infection You are more likely to be infected with this virus if you:  Are within 6 feet (2 meters) of a person with COVID-19.  Provide care for or live with a person who is infected with COVID-19.  Spend time in crowded indoor spaces or live in shared housing. Risk for serious illness You are more likely to become seriously ill from the virus if you:   Are 50 years of age or older. The higher your age, the more you are at risk for serious illness.  Live in a nursing home or long-term care facility.  Have cancer.  Have a long-term (chronic) disease such as: ? Chronic lung disease, including chronic obstructive pulmonary disease or asthma. ? A long-term disease that lowers your body's ability to fight infection (immunocompromised). ? Heart disease, including heart failure, a condition in which the arteries that lead to the heart become narrow or blocked (coronary artery disease), a disease which makes the heart muscle thick, weak, or stiff (cardiomyopathy). ? Diabetes. ? Chronic kidney disease. ? Sickle cell disease, a condition in which red blood cells have an abnormal "sickle" shape. ? Liver disease.  Are obese. What are the signs or symptoms? Symptoms of this condition can range from mild to severe. Symptoms may appear any time from 2 to 14 days after being exposed to the virus. They include:  A fever or chills.  A cough.  Difficulty breathing.  Headaches, body aches, or muscle aches.  Runny or stuffy (congested) nose.  A sore throat.  New loss of taste or smell. Some people may also have stomach problems, such as nausea, vomiting, or diarrhea. Other people may not have any symptoms of COVID-19. How is this diagnosed? This condition may be diagnosed based on:  Your signs and symptoms, especially if: ? You live in an area with a COVID-19 outbreak. ? You recently traveled to or from an area where the virus is common. ? You   provide care for or live with a person who was diagnosed with COVID-19. ? You were exposed to a person who was diagnosed with COVID-19.  A physical exam.  Lab tests, which may include: ? Taking a sample of fluid from the back of your nose and throat (nasopharyngeal fluid), your nose, or your throat using a swab. ? A sample of mucus from your lungs (sputum). ? Blood tests.  Imaging tests, which  may include, X-rays, CT scan, or ultrasound. How is this treated? At present, there is no medicine to treat COVID-19. Medicines that treat other diseases are being used on a trial basis to see if they are effective against COVID-19. Your health care provider will talk with you about ways to treat your symptoms. For most people, the infection is mild and can be managed at home with rest, fluids, and over-the-counter medicines. Treatment for a serious infection usually takes places in a hospital intensive care unit (ICU). It may include one or more of the following treatments. These treatments are given until your symptoms improve.  Receiving fluids and medicines through an IV.  Supplemental oxygen. Extra oxygen is given through a tube in the nose, a face mask, or a hood.  Positioning you to lie on your stomach (prone position). This makes it easier for oxygen to get into the lungs.  Continuous positive airway pressure (CPAP) or bi-level positive airway pressure (BPAP) machine. This treatment uses mild air pressure to keep the airways open. A tube that is connected to a motor delivers oxygen to the body.  Ventilator. This treatment moves air into and out of the lungs by using a tube that is placed in your windpipe.  Tracheostomy. This is a procedure to create a hole in the neck so that a breathing tube can be inserted.  Extracorporeal membrane oxygenation (ECMO). This procedure gives the lungs a chance to recover by taking over the functions of the heart and lungs. It supplies oxygen to the body and removes carbon dioxide. Follow these instructions at home: Lifestyle  If you are sick, stay home except to get medical care. Your health care provider will tell you how long to stay home. Call your health care provider before you go for medical care.  Rest at home as told by your health care provider.  Do not use any products that contain nicotine or tobacco, such as cigarettes, e-cigarettes, and  chewing tobacco. If you need help quitting, ask your health care provider.  Return to your normal activities as told by your health care provider. Ask your health care provider what activities are safe for you. General instructions  Take over-the-counter and prescription medicines only as told by your health care provider.  Drink enough fluid to keep your urine pale yellow.  Keep all follow-up visits as told by your health care provider. This is important. How is this prevented?  There is no vaccine to help prevent COVID-19 infection. However, there are steps you can take to protect yourself and others from this virus. To protect yourself:   Do not travel to areas where COVID-19 is a risk. The areas where COVID-19 is reported change often. To identify high-risk areas and travel restrictions, check the CDC travel website: wwwnc.cdc.gov/travel/notices  If you live in, or must travel to, an area where COVID-19 is a risk, take precautions to avoid infection. ? Stay away from people who are sick. ? Wash your hands often with soap and water for 20 seconds. If soap and water   are not available, use an alcohol-based hand sanitizer. ? Avoid touching your mouth, face, eyes, or nose. ? Avoid going out in public, follow guidance from your state and local health authorities. ? If you must go out in public, wear a cloth face covering or face mask. Make sure your mask covers your nose and mouth. ? Avoid crowded indoor spaces. Stay at least 6 feet (2 meters) away from others. ? Disinfect objects and surfaces that are frequently touched every day. This may include:  Counters and tables.  Doorknobs and light switches.  Sinks and faucets.  Electronics, such as phones, remote controls, keyboards, computers, and tablets. To protect others: If you have symptoms of COVID-19, take steps to prevent the virus from spreading to others.  If you think you have a COVID-19 infection, contact your health care  provider right away. Tell your health care team that you think you may have a COVID-19 infection.  Stay home. Leave your house only to seek medical care. Do not use public transport.  Do not travel while you are sick.  Wash your hands often with soap and water for 20 seconds. If soap and water are not available, use alcohol-based hand sanitizer.  Stay away from other members of your household. Let healthy household members care for children and pets, if possible. If you have to care for children or pets, wash your hands often and wear a mask. If possible, stay in your own room, separate from others. Use a different bathroom.  Make sure that all people in your household wash their hands well and often.  Cough or sneeze into a tissue or your sleeve or elbow. Do not cough or sneeze into your hand or into the air.  Wear a cloth face covering or face mask. Make sure your mask covers your nose and mouth. Where to find more information  Centers for Disease Control and Prevention: www.cdc.gov/coronavirus/2019-ncov/index.html  World Health Organization: www.who.int/health-topics/coronavirus Contact a health care provider if:  You live in or have traveled to an area where COVID-19 is a risk and you have symptoms of the infection.  You have had contact with someone who has COVID-19 and you have symptoms of the infection. Get help right away if:  You have trouble breathing.  You have pain or pressure in your chest.  You have confusion.  You have bluish lips and fingernails.  You have difficulty waking from sleep.  You have symptoms that get worse. These symptoms may represent a serious problem that is an emergency. Do not wait to see if the symptoms will go away. Get medical help right away. Call your local emergency services (911 in the [place] Do not drive yourself to the hospital. Let the emergency medical personnel know if you think you have COVID-19. Summary  COVID-19 is a  respiratory infection that is caused by a virus. It is also known as coronavirus disease or novel coronavirus. It can cause serious infections, such as pneumonia, acute respiratory distress syndrome, acute respiratory failure, or sepsis.  The virus that causes COVID-19 is contagious. This means that it can spread from person to person through droplets from breathing, speaking, singing, coughing, or sneezing.  You are more likely to develop a serious illness if you are 50 years of age or older, have a weak immune system, live in a nursing home, or have chronic disease.  There is no medicine to treat COVID-19. Your health care provider will talk with you about ways to treat your symptoms.    Take steps to protect yourself and others from infection. Wash your hands often and disinfect objects and surfaces that are frequently touched every day. Stay away from people who are sick and wear a mask if you are sick. This information is not intended to replace advice given to you by your health care provider. Make sure you discuss any questions you have with your health care provider. Document Revised: 11/30/2018 Document Reviewed: 03/08/2018 Elsevier Patient Education  2020 [employer]  

## 2020-02-24 ENCOUNTER — Other Ambulatory Visit: Payer: Self-pay | Admitting: *Deleted

## 2020-02-24 ENCOUNTER — Other Ambulatory Visit (INDEPENDENT_AMBULATORY_CARE_PROVIDER_SITE_OTHER): Payer: Medicare Other

## 2020-02-24 ENCOUNTER — Telehealth: Payer: Self-pay | Admitting: Endocrinology

## 2020-02-24 ENCOUNTER — Other Ambulatory Visit: Payer: Self-pay

## 2020-02-24 DIAGNOSIS — E1169 Type 2 diabetes mellitus with other specified complication: Secondary | ICD-10-CM | POA: Diagnosis not present

## 2020-02-24 LAB — COMPREHENSIVE METABOLIC PANEL
ALT: 15 U/L (ref 0–35)
AST: 11 U/L (ref 0–37)
Albumin: 4 g/dL (ref 3.5–5.2)
Alkaline Phosphatase: 41 U/L (ref 39–117)
BUN: 11 mg/dL (ref 6–23)
CO2: 28 mEq/L (ref 19–32)
Calcium: 9.6 mg/dL (ref 8.4–10.5)
Chloride: 106 mEq/L (ref 96–112)
Creatinine, Ser: 0.6 mg/dL (ref 0.40–1.20)
GFR: 94.36 mL/min (ref >60.00)
Glucose, Bld: 102 mg/dL — ABNORMAL HIGH (ref 70–99)
Potassium: 4 mEq/L (ref 3.5–5.1)
Sodium: 139 mEq/L (ref 135–145)
Total Bilirubin: 0.4 mg/dL (ref 0.2–1.2)
Total Protein: 6.6 g/dL (ref 6.0–8.3)

## 2020-02-24 LAB — HEMOGLOBIN A1C: Hgb A1c MFr Bld: 6.6 % — ABNORMAL HIGH (ref 4.6–6.5)

## 2020-02-24 MED ORDER — GLUCOSE BLOOD VI STRP: ORAL_STRIP | 2 refills | Status: DC

## 2020-02-24 NOTE — Telephone Encounter (Signed)
Rx sent 

## 2020-02-24 NOTE — Telephone Encounter (Signed)
Patient requests new RX for   glucose blood test strip (Patient states she is testing at least 2 x per day) be sent to  [facility] ([place] Phone:  [phone]  Fax:  [phone]

## 2020-02-27 ENCOUNTER — Encounter: Payer: Self-pay | Admitting: Endocrinology

## 2020-02-27 ENCOUNTER — Other Ambulatory Visit: Payer: Self-pay

## 2020-02-27 ENCOUNTER — Telehealth (INDEPENDENT_AMBULATORY_CARE_PROVIDER_SITE_OTHER): Payer: Medicare Other | Admitting: Endocrinology

## 2020-02-27 VITALS — Temp 96.5°F | Wt 235.0 lb

## 2020-02-27 DIAGNOSIS — E1169 Type 2 diabetes mellitus with other specified complication: Secondary | ICD-10-CM | POA: Diagnosis not present

## 2020-02-27 NOTE — Progress Notes (Signed)
Patient ID: [name], female   DOB: 02/15/1955, 65 y.o.   MRN: [id number]           Reason for Appointment: Follow-up for Type 2 Diabetes  I connected with the above-named patient by video enabled telemedicine application and verified that I am speaking with the correct person. The patient was explained the limitations of evaluation and management by telemedicine and the availability of in person appointments.  Patient also understood that there may be a patient responsible charge related to this service . Location of the patient: Patient's home . Location of the provider: Physician office Only the patient and myself were participating in the encounter The patient understood the above statements and agreed to proceed.   History of Present Illness:          Date of diagnosis of type 2 diabetes mellitus: 09/2017       Background history:   Her diabetes was diagnosed with an A1c of 6.8 Review of her records indicate that probably that her highest fasting blood sugar has been only 130 outside the hospital settings  Recent history:   Most recent A1c is 6.6, previously in range 6.4-7.1  Fructosamine is last 219  Non-insulin hypoglycemic drugs the patient is taking are: Metformin ER 500 mg 3 tablets daily  Current management, blood sugar patterns and problems identified:  She was advised to try taking all 3 tablets of metformin together since she had difficulty remembering to take them staggered during the day especially with her work schedule  She thinks she is taking either 1 pill in the morning and 2 in the evening or the other way around and not 3 at a time  She does not report any diarrhea or abdominal symptoms with this  Again she has limitations in doing any exercise because of her knee pain but she says she is somewhat active at work  She is trying to follow her diet as previously discussed with dietitian and reports her weight down to 235 pounds at home  Blood sugars  are excellent although checked infrequently and highest reading 121 in the morning  Lab glucose was 102 late morning        Side effects from medications have been: Vaginitis from Jardiance               Exercise:  minimal  Glucose monitoring:  Using One Touch Verio, last 30-day data:   PRE-MEAL Fasting Lunch Dinner Bedtime Overall  Glucose range: 95-121  117    Mean/median: 107    110   Previous data:   PRE-MEAL Fasting  midday Dinner Bedtime Overall  Glucose range:  109-138  93-128  121    Mean/median:  122     114    AVERAGE blood sugar 117 overall with blood sugar range 90-142 FASTING average about 120     Weight history: Highest 262   Wt Readings from Last 3 Encounters:  12/25/19 243 lb 12.8 oz (110.6 kg)  12/25/19 244 lb (110.7 kg)  12/11/19 242 lb (109.8 kg)    Glycemic control:   Lab Results  Component Value Date   HGBA1C 6.6 (H) 02/24/2020   HGBA1C 7.1 (H) 10/30/2019   HGBA1C 6.4 (H) 08/23/2019   Lab Results  Component Value Date   MICROALBUR 0.8 08/23/2019   LDLCALC 79 08/23/2019   CREATININE 0.60 02/24/2020   No results found for: [facility]  Lab Results  Component Value Date   FRUCTOSAMINE 219 12/23/2019  Allergies as of 02/27/2020      Reactions   Jardiance [empagliflozin] Anaphylaxis, Itching, Rash, Other (See Comments)   Yeast infection   Atorvastatin Other (See Comments)   REACTION: mylagias   Pravastatin Sodium Other (See Comments)   REACTION: myalgias   Sulfa Antibiotics Hives      Medication List       Accurate as of February 27, 2020  2:18 PM. If you have any questions, ask your nurse or doctor.        aspirin 81 MG tablet Take 81 mg by mouth daily.   azithromycin 250 MG tablet Commonly known as: ZITHROMAX Take 2 tablets (500 mg) on day 1 and then 1 tablet (250 mg) days 2 to 5.   carvedilol 6.25 MG tablet Commonly known as: COREG TAKE 1 TABLET BY MOUTH TWICE DAILY WITH A MEAL.   cholecalciferol 1000 units  tablet Commonly known as: VITAMIN D Take 1,000 Units by mouth daily.   clotrimazole 1 % cream Commonly known as: Lotrimin AF Apply 1 application topically in the morning, at noon, and at bedtime.   Entresto 24-26 MG Generic drug: sacubitril-valsartan Take 1 tablet by mouth 2 (two) times daily.   ezetimibe 10 MG tablet Commonly known as: ZETIA Take 1 tablet (10 mg total) by mouth daily.   furosemide 40 MG tablet Commonly known as: LASIX TAKE ONE TO TWO TABLETS BY MOUTH DAILY AS DIRECTED   gabapentin 100 MG capsule Commonly known as: NEURONTIN Take 1 capsule (100 mg total) by mouth 2 (two) times daily.   glucose blood test strip Use OneTouch Verio Test strips to check blood sugar 3 times weekly, alternatiing between fasting and 2 hours after meals. Dx code E11.65   Melatonin 10 MG Tabs Take by mouth at bedtime.   metFORMIN 500 MG 24 hr tablet Commonly known as: GLUCOPHAGE-XR Take 3 tablets (1,500 mg total) by mouth daily with supper.   multivitamin tablet Take 1 tablet by mouth daily.   nystatin-triamcinolone ointment Commonly known as: MYCOLOG Apply 1 application topically 3 (three) times daily.   OneTouch Delica Lancets 03K Misc Use OneTouch Delica lancets to check blood sugar 3 times weekly, alternating between fasting and 2 hours after meals.   OneTouch Verio Flex System w/Device Kit Use onetouch verio flex meter to check blood sugar 3 times weekly, alternating between fasting and 2 hours after meals.   rosuvastatin 5 MG tablet Commonly known as: CRESTOR Take 1 tablet by mouth once daily   spironolactone 25 MG tablet Commonly known as: ALDACTONE Take 1/2 (one-half) tablet by mouth once daily   Tylenol 8 Hour Arthritis Pain 650 MG CR tablet Generic drug: acetaminophen Take 1,300 mg by mouth as needed for pain.   vitamin C 500 MG tablet Commonly known as: ASCORBIC ACID Take 2,000 mg by mouth as needed.       Allergies:  Allergies  Allergen Reactions   . Jardiance [Empagliflozin] Anaphylaxis, Itching, Rash and Other (See Comments)    Yeast infection  . Atorvastatin Other (See Comments)    REACTION: mylagias  . Pravastatin Sodium Other (See Comments)    REACTION: myalgias  . Sulfa Antibiotics Hives    Past Medical History:  Diagnosis Date  . Anxiety   . Aortic stenosis, mild    echo 2017  . Breast cancer ([place]) 1996   left breast  . Carpal tunnel syndrome   . Cataract   . Chronic diastolic CHF (congestive heart failure) (HCC)    NYHA class  II  . Diabetes mellitus without complication ([place])   . Hyperlipidemia   . Hypertension   . Left bundle branch block (LBBB)   . Low back pain   . Neuropathy   . NICM (nonischemic cardiomyopathy) ([place])    EF 30%, cath 05/20/2015 clean coronary.  EF resolved by echo with EF 55%  . OSA on CPAP   . Prediabetes   . Pulmonary HTN ([place]) 05/2015   Severe with PASP 67/37mHg - resolved on followup echo  . PVC's (premature ventricular contractions) 11/04/2015  . Sleep apnea    cpap    Past Surgical History:  Procedure Laterality Date  . BREAST SURGERY    . CARDIAC CATHETERIZATION N/A 05/20/2015   Procedure: Right/Left Heart Cath and Coronary Angiography;  Surgeon: TTroy Sine MD;  [facility];  Service: Cardiovascular;  Laterality: N/A;  . CATARACT EXTRACTION W/ INTRAOCULAR LENS  IMPLANT, BILATERAL    . COLONOSCOPY  2018  . DILATION AND CURETTAGE OF UTERUS    . lumpectomy left breast    . POLYPECTOMY    . UTERINE FIBROID SURGERY      Family History  Problem Relation Age of Onset  . Arthritis Mother   . Depression Mother   . Hearing loss Mother   . Hyperlipidemia Mother   . Miscarriages / [place]   . Colon cancer Mother 886       2010 . Colon polyps Mother   . Alcohol abuse Father   . Cancer Father        PROSTATE  . Depression Father   . Hypertension Father   . Hyperlipidemia Father   . Drug abuse Brother   . Early death Maternal Grandmother   .  Cancer Paternal Grandmother   . Heart attack Neg Hx   . Diabetes Neg Hx   . Esophageal cancer Neg Hx   . Rectal cancer Neg Hx   . Stomach cancer Neg Hx     Social History:  reports that she quit smoking about 10 years ago. Her smoking use included cigarettes. She has never used smokeless tobacco. She reports current alcohol use. She reports that she does not use drugs.   Review of Systems    Lipid history: She has been treated with Crestor and Zetia by her PCP with the following results    Lab Results  Component Value Date   CHOL 156 08/23/2019   HDL 59 08/23/2019   LDLCALC 79 08/23/2019   TRIG 99 08/23/2019   CHOLHDL 2.6 08/23/2019        CARDIAC: Last ejection fraction on echo 55%     Hypertension: Has been present, is on low-dose Coreg and spironolactone as well as Entresto Blood pressure history:  BP Readings from Last 3 Encounters:  02/21/20 131/74  12/25/19 140/78  12/25/19 130/82    Most recent eye exam was in 2021  Most recent foot exam: 8/21  She is recovering from a COVID infection diagnosed on 02/17/2020  LABS:  Lab on 02/24/2020  Component Date Value Ref Range Status  . Sodium 02/24/2020 139  135 - 145 mEq/L Final  . Potassium 02/24/2020 4.0  3.5 - 5.1 mEq/L Final  . Chloride 02/24/2020 106  96 - 112 mEq/L Final  . CO2 02/24/2020 28  19 - 32 mEq/L Final  . Glucose, Bld 02/24/2020 102* 70 - 99 mg/dL Final  . BUN 02/24/2020 11  6 - 23 mg/dL Final  . Creatinine, Ser 02/24/2020 0.60  0.40 - 1.20 mg/dL Final  . Total Bilirubin 02/24/2020 0.4  0.2 - 1.2 mg/dL Final  . Alkaline Phosphatase 02/24/2020 41  39 - 117 U/L Final  . AST 02/24/2020 11  0 - 37 U/L Final  . ALT 02/24/2020 15  0 - 35 U/L Final  . Total Protein 02/24/2020 6.6  6.0 - 8.3 g/dL Final  . Albumin 02/24/2020 4.0  3.5 - 5.2 g/dL Final  . GFR 02/24/2020 94.36  >60.00 mL/min Final   Calculated using the CKD-EPI Creatinine Equation (2021)  . Calcium 02/24/2020 9.6  8.4 - 10.5 mg/dL Final   . Hgb A1c MFr Bld 02/24/2020 6.6* 4.6 - 6.5 % Final   Glycemic Control Guidelines for People with Diabetes:Non Diabetic:  <6%Goal of Therapy: <7%Additional Action Suggested:  >8%     Physical Examination:  There were no vitals taken for this visit.      ASSESSMENT:  Diabetes type 2 with BMI over 40  See history of present illness for detailed discussion of current diabetes assessment and problems identified  A1c was last 7.1 and now 6.6  She is doing better with taking 3 tablets of metformin consistently Also is appearing to be doing better with her diet and meal planning, avoiding soft drinks Weight is slightly better reportedly Most of her blood sugars are close to normal but checking mostly in the morning   PLAN:    Encouraged her to do some chair exercises or upper body exercises with Verio programs or weights Check blood sugars by rotation at different times Follow-up in 3 months, can do same-day visit   There are no Patient Instructions on file for this visit.     Elayne Snare 02/27/2020, 2:18 PM   Note: This office note was prepared with Dragon voice recognition system technology. Any transcriptional errors that result from this process are unintentional.

## 2020-03-16 ENCOUNTER — Other Ambulatory Visit: Payer: Self-pay | Admitting: Cardiology

## 2020-03-24 ENCOUNTER — Other Ambulatory Visit: Payer: Self-pay | Admitting: Endocrinology

## 2020-03-25 NOTE — Telephone Encounter (Signed)
Patient called to request a new RX with refills for metFORMIN (GLUCOPHAGE-XR) 500 MG 24 hr tablet. (Patient states she takes 3 tablets per day) be sent asap to  [facility] [place] Phone:  [phone]  Fax:  [phone]

## 2020-04-05 ENCOUNTER — Other Ambulatory Visit: Payer: Self-pay | Admitting: Cardiology

## 2020-04-14 ENCOUNTER — Other Ambulatory Visit: Payer: Self-pay | Admitting: Family Medicine

## 2020-04-15 ENCOUNTER — Ambulatory Visit (INDEPENDENT_AMBULATORY_CARE_PROVIDER_SITE_OTHER): Payer: Medicare Other | Admitting: Family Medicine

## 2020-04-15 ENCOUNTER — Encounter: Payer: Self-pay | Admitting: Family Medicine

## 2020-04-15 ENCOUNTER — Other Ambulatory Visit: Payer: Self-pay

## 2020-04-15 VITALS — BP 100/60 | HR 71 | Temp 97.2°F | Ht 64.0 in | Wt 239.4 lb

## 2020-04-15 DIAGNOSIS — Z800 Family history of malignant neoplasm of digestive organs: Secondary | ICD-10-CM | POA: Diagnosis not present

## 2020-04-15 DIAGNOSIS — N959 Unspecified menopausal and perimenopausal disorder: Secondary | ICD-10-CM

## 2020-04-15 DIAGNOSIS — R3129 Other microscopic hematuria: Secondary | ICD-10-CM

## 2020-04-15 DIAGNOSIS — E119 Type 2 diabetes mellitus without complications: Secondary | ICD-10-CM | POA: Diagnosis not present

## 2020-04-15 DIAGNOSIS — I428 Other cardiomyopathies: Secondary | ICD-10-CM | POA: Diagnosis not present

## 2020-04-15 DIAGNOSIS — E782 Mixed hyperlipidemia: Secondary | ICD-10-CM

## 2020-04-15 DIAGNOSIS — Z853 Personal history of malignant neoplasm of breast: Secondary | ICD-10-CM

## 2020-04-15 LAB — URINALYSIS, ROUTINE W REFLEX MICROSCOPIC
Bilirubin Urine: NEGATIVE
Ketones, ur: NEGATIVE
Leukocytes,Ua: NEGATIVE
Nitrite: NEGATIVE
Specific Gravity, Urine: 1.025 (ref 1.000–1.030)
Total Protein, Urine: NEGATIVE
Urine Glucose: NEGATIVE
Urobilinogen, UA: 0.2 (ref 0.0–1.0)
pH: 5.5 (ref 5.0–8.0)

## 2020-04-15 MED ORDER — GABAPENTIN 100 MG PO CAPS: 100.0000 mg | ORAL_CAPSULE | Freq: Three times a day (TID) | ORAL | 1 refills | Status: DC | PRN

## 2020-04-15 NOTE — Patient Instructions (Addendum)
Post menopausal women we like on calcium and vitamin D: 1200mg /day and 1000IU/vitamin D3 daily. If you can't do the higher calcium try 600mg /day.   Ordered a bone scan on you. They will call you with this.   Ordered your gabapentin. You can take this up to three times a day. Make sure you don't have drowsiness with this.   Checking urine, but not worried looking at past results.   See you every 6 months ! Dr. Rogers Blocker

## 2020-04-15 NOTE — Progress Notes (Signed)
Patient: [name] MRN: [id number] DOB: 12-05-1955 PCP: Orma Flaming, MD     Subjective:  Chief Complaint  Patient presents with  . Establish Care    Pt wasn't satisfied with previous PCP.  Marland Kitchen Hyperlipidemia  . Diabetes  . Cardiomyopathy  . Hypertension    HPI: The patient is a 65 y.o. female who presents today to establish care. She has a past medical hx of type 2 diabetes followed by endocrinology, nonischemic cardiomyopathy and HTN heart disease followed by cardiology, remote hx of breast cancer, hyperlipidemia, mild aortic stenosis, OSA on CpAP.   Maternal hx of colon cancer. UTD on her cscope. Last done 10/16/2019. Recall q 5 years.   1) hyperlipidemia Currently on zetia and crestor 5mg .  Can not tolerate high dose statins. Lipids checked in July and to goal.   2) type 2 diabetes  Followed by Dr. Dwyane Dee. Lats a1c was 6.6. doing well on metformin 500mg  daily only.   3) monischemic cardiomyopathy and mild diasotlic dysfunction Followed by cardiology, I believe Dr. Radford Pax. Echo done 7/21 and showed EF of 45-50%. Grade 2 diastolic dysfunction. Mild aortic stenosis. Currently on entresto, lasix, spironolactone and coreg.   Asking about treatment for neuropathy and has questions about Neurontin. currently not taking.   ? Hx of microscopic hematuria.    Review of Systems  Constitutional: Negative for chills, fatigue and fever.  HENT: Negative for dental problem, ear pain, hearing loss and trouble swallowing.   Eyes: Negative for visual disturbance.  Respiratory: Negative for cough, chest tightness and shortness of breath.   Cardiovascular: Negative for chest pain, palpitations and leg swelling.  Gastrointestinal: Negative for abdominal pain, blood in stool, diarrhea and nausea.  Endocrine: Negative for cold intolerance, polydipsia, polyphagia and polyuria.  Genitourinary: Negative for dysuria and hematuria.  Musculoskeletal: Negative for arthralgias.  Skin: Negative for  rash.  Neurological: Negative for dizziness and headaches.  Psychiatric/Behavioral: Negative for dysphoric mood and sleep disturbance. The patient is not nervous/anxious.     Allergies Patient is allergic to jardiance [empagliflozin], atorvastatin, pravastatin sodium, and sulfa antibiotics.  Past Medical History Patient  has a past medical history of Anxiety, Aortic stenosis, mild, Breast cancer ([place]) (1996), Carpal tunnel syndrome, Cataract, Chronic diastolic CHF (congestive heart failure) ([place]), Diabetes mellitus without complication ([place]), Hyperlipidemia, Hypertension, Left bundle branch block (LBBB), Low back pain, Neuropathy, NICM (nonischemic cardiomyopathy) ([place]), OSA on CPAP, Prediabetes, Pulmonary HTN ([place]) (05/2015), PVC's (premature ventricular contractions) (11/04/2015), and Sleep apnea.  Surgical History Patient  has a past surgical history that includes Cataract extraction w/ intraocular lens  implant, bilateral; lumpectomy left breast; Dilation and curettage of uterus; Cardiac catheterization (N/A, 05/20/2015); Uterine fibroid surgery; Colonoscopy (2018); Polypectomy; and Breast surgery.  Family History Pateint's family history includes Alcohol abuse in her father; Arthritis in her mother; Cancer in her father and paternal grandmother; Colon cancer (age of onset: 58) in her mother; Colon polyps in her mother; Depression in her father and mother; Drug abuse in her brother; Early death in her maternal grandmother; Hearing loss in her mother; Hyperlipidemia in her father and mother; Hypertension in her father; Miscarriages / [place] in her mother.  Social History Patient  reports that she quit smoking about 10 years ago. Her smoking use included cigarettes. She has never used smokeless tobacco. She reports current alcohol use. She reports that she does not use drugs.    Objective: Vitals:   04/15/20 1033  BP: 100/60  Pulse: 71  Temp: (!) 97.2 F (36.2  C)  TempSrc: Temporal   SpO2: 98%  Weight: 239 lb 6.4 oz (108.6 kg)  Height: 5\' 4"  (1.626 m)    Body mass index is 41.09 kg/m.  Physical Exam Vitals reviewed.  Constitutional:      Appearance: Normal appearance. She is well-developed and well-nourished. She is obese.  HENT:     Head: Normocephalic and atraumatic.     Right Ear: Tympanic membrane, ear canal and external ear normal.     Left Ear: Tympanic membrane, ear canal and external ear normal.     Mouth/Throat:     Mouth: Oropharynx is clear and moist.  Eyes:     Extraocular Movements: EOM normal.     Conjunctiva/sclera: Conjunctivae normal.     Pupils: Pupils are equal, round, and reactive to light.  Neck:     Thyroid: No thyromegaly.     Vascular: No carotid bruit.  Cardiovascular:     Rate and Rhythm: Normal rate and regular rhythm.     Pulses: Intact distal pulses.     Heart sounds: Normal heart sounds. No murmur heard.   Pulmonary:     Effort: Pulmonary effort is normal.     Breath sounds: Normal breath sounds.  Abdominal:     General: Bowel sounds are normal. There is no distension.     Palpations: Abdomen is soft.     Tenderness: There is no abdominal tenderness.  Musculoskeletal:     Cervical back: Normal range of motion and neck supple.  Lymphadenopathy:     Cervical: No cervical adenopathy.  Skin:    General: Skin is warm and dry.     Capillary Refill: Capillary refill takes less than 2 seconds.     Findings: No rash.  Neurological:     General: No focal deficit present.     Mental Status: She is alert and oriented to person, place, and time.     Cranial Nerves: No cranial nerve deficit.     Coordination: Coordination normal.     Deep Tendon Reflexes: Reflexes normal.  Psychiatric:        Mood and Affect: Mood and affect normal.        Behavior: Behavior normal.       Assessment/plan: 1. Type 2 diabetes mellitus without complication, without long-term current use of insulin ([place]) Followed by dr. Dwyane Dee. Very well  controlled. Can take over if needed. Discussed neurontin in detail. She will start this low dose at 100mg  bid-tid prn. If any issues let me know.   2. Mixed hyperlipidemia To goal. No refills needed. Filled by cards.   3. Nonischemic cardiomyopathy ([place]) Followed by cardiology. Euvolemic today.   4. Menopausal and postmenopausal disorder  - DG Bone Density; Future  5. Family history of colon cancer in mother utd on cscopes.   6. Other microscopic hematuria No RBC seen, always 0-2, but has trace hgb. Will ask if she has seen urology, but never has had criteria with RBC.  - Urinalysis, Routine w reflex microscopic  Hm reviewed. Discussed tetanus, but medicare will not cover for preventative in office.   Total time of encounter: 45 minutes total time of encounter, including 30 minutes spent in face-to-face patient care. This time includes coordination of care and counseling regarding chronic medication conditions, care team, medication, management as well as thorough review of chart. Remainder of non-face-to-face time involved reviewing chart documents/testing relevant to the patient encounter and documentation in the medical record.    This visit occurred during the  SARS-CoV-2 public health emergency.  Safety protocols were in place, including screening questions prior to the visit, additional usage of staff PPE, and extensive cleaning of exam room while observing appropriate contact time as indicated for disinfecting solutions.     Return in about 6 months (around 10/16/2020) for routien f/u .    Orma Flaming, MD [place]   04/16/2020

## 2020-04-22 ENCOUNTER — Other Ambulatory Visit: Payer: Self-pay | Admitting: Endocrinology

## 2020-04-24 DIAGNOSIS — G4733 Obstructive sleep apnea (adult) (pediatric): Secondary | ICD-10-CM | POA: Diagnosis not present

## 2020-05-07 ENCOUNTER — Other Ambulatory Visit: Payer: Self-pay | Admitting: Family Medicine

## 2020-05-07 DIAGNOSIS — N959 Unspecified menopausal and perimenopausal disorder: Secondary | ICD-10-CM

## 2020-05-08 DIAGNOSIS — R928 Other abnormal and inconclusive findings on diagnostic imaging of breast: Secondary | ICD-10-CM | POA: Diagnosis not present

## 2020-05-08 DIAGNOSIS — R921 Mammographic calcification found on diagnostic imaging of breast: Secondary | ICD-10-CM | POA: Diagnosis not present

## 2020-05-08 DIAGNOSIS — R922 Inconclusive mammogram: Secondary | ICD-10-CM | POA: Diagnosis not present

## 2020-05-08 LAB — HM MAMMOGRAPHY

## 2020-05-10 ENCOUNTER — Encounter: Payer: Self-pay | Admitting: Family Medicine

## 2020-05-11 ENCOUNTER — Encounter: Payer: Self-pay | Admitting: Family Medicine

## 2020-05-13 ENCOUNTER — Telehealth: Payer: Self-pay

## 2020-05-13 NOTE — Telephone Encounter (Signed)
Pt is calling in reference to urine dip done in the office 04/15/2020. Pt was given lab note from lab result note. She is still concerned because results are highlighted in red. Pt was told that her RBC under the microscope were normal. Pt still has concerns. Pt says that she has not been seen in Urology.   Please Advise.

## 2020-05-14 ENCOUNTER — Other Ambulatory Visit: Payer: Self-pay | Admitting: Family Medicine

## 2020-05-14 DIAGNOSIS — R3129 Other microscopic hematuria: Secondary | ICD-10-CM

## 2020-05-14 NOTE — Telephone Encounter (Signed)
I left a lab note on her urine. She has no RBC in her urine which is what we look at for hematuria. If she is concerned would recommend one more urine and if positive we can send to urology.  Orma Flaming, MD [place]

## 2020-05-14 NOTE — Telephone Encounter (Signed)
FYI

## 2020-05-14 NOTE — Telephone Encounter (Signed)
I spoke with the pt to give message below, she says that she will schedule a lab appointment to drop off sample. But she likes to schedule her appointments together, so it won't be until 4/10-12.

## 2020-05-25 ENCOUNTER — Other Ambulatory Visit (INDEPENDENT_AMBULATORY_CARE_PROVIDER_SITE_OTHER): Payer: Medicare Other

## 2020-05-25 ENCOUNTER — Other Ambulatory Visit: Payer: Self-pay

## 2020-05-25 DIAGNOSIS — R3129 Other microscopic hematuria: Secondary | ICD-10-CM

## 2020-05-26 ENCOUNTER — Other Ambulatory Visit: Payer: Self-pay | Admitting: Family Medicine

## 2020-05-26 DIAGNOSIS — E119 Type 2 diabetes mellitus without complications: Secondary | ICD-10-CM | POA: Diagnosis not present

## 2020-05-26 DIAGNOSIS — H52203 Unspecified astigmatism, bilateral: Secondary | ICD-10-CM | POA: Diagnosis not present

## 2020-05-26 DIAGNOSIS — H43393 Other vitreous opacities, bilateral: Secondary | ICD-10-CM | POA: Diagnosis not present

## 2020-05-26 DIAGNOSIS — H43813 Vitreous degeneration, bilateral: Secondary | ICD-10-CM | POA: Diagnosis not present

## 2020-05-26 DIAGNOSIS — R3129 Other microscopic hematuria: Secondary | ICD-10-CM

## 2020-05-26 DIAGNOSIS — Z7984 Long term (current) use of oral hypoglycemic drugs: Secondary | ICD-10-CM | POA: Diagnosis not present

## 2020-05-26 DIAGNOSIS — H524 Presbyopia: Secondary | ICD-10-CM | POA: Diagnosis not present

## 2020-05-26 DIAGNOSIS — Z961 Presence of intraocular lens: Secondary | ICD-10-CM | POA: Diagnosis not present

## 2020-05-26 LAB — URINALYSIS, ROUTINE W REFLEX MICROSCOPIC
Bilirubin Urine: NEGATIVE
Ketones, ur: NEGATIVE
Leukocytes,Ua: NEGATIVE
Nitrite: NEGATIVE
Specific Gravity, Urine: 1.01 (ref 1.000–1.030)
Total Protein, Urine: NEGATIVE
Urine Glucose: NEGATIVE
Urobilinogen, UA: 0.2 (ref 0.0–1.0)
pH: 6 (ref 5.0–8.0)

## 2020-05-29 ENCOUNTER — Other Ambulatory Visit: Payer: Self-pay | Admitting: Endocrinology

## 2020-06-28 NOTE — Progress Notes (Signed)
Patient ID: [name], female   DOB: 02/16/1955, 65 y.o.   MRN: [id number]           Reason for Appointment: Follow-up for Type 2 Diabetes     History of Present Illness:          Date of diagnosis of type 2 diabetes mellitus: 09/2017       Background history:   Her diabetes was diagnosed with an A1c of 6.8 Review of her records indicate that probably that her highest fasting blood sugar has been only 130 outside the hospital settings  Recent history:   Most recent A1c is 6 compared to 6.6 Previous range 6.4-7.1  Fructosamine is last 219  Non-insulin hypoglycemic drugs the patient is taking are: Metformin ER 500 mg 3 tablets daily  Current management, blood sugar patterns and problems identified:  She has been able to take 3 tablets of metformin together  Although she has not lost any weight and is gradually gaining weight she is trying to eat healthy especially trying to get a salad when she is eating out at work  No side effects from metformin  As before she has limitations in doing any exercise because of her knee pain  She is trying to keep up with her diet as previously discussed with dietitian  Blood sugars are excellent at home and highest reading only 1 time above 200  Lab glucose was 102 late morning  Last consultation with dietitian: 10/21       Side effects from medications have been: Vaginitis from Jardiance               Exercise:  minimal  Glucose monitoring:  Using One Touch Verio, last 30-day data:   PRE-MEAL Fasting Lunch Dinner Bedtime Overall  Glucose range:  91-131  101-200     Mean/median:  111  123  105  106 112   POST-MEAL PC Breakfast PC Lunch PC Dinner  Glucose range:     Mean/median:      Previously:  PRE-MEAL Fasting Lunch Dinner Bedtime Overall  Glucose range: 95-121  117    Mean/median: 107    110     Weight history: Highest 262   Wt Readings from Last 3 Encounters:  06/29/20 242 lb 9.6 oz (110 kg)  04/15/20 239  lb 6.4 oz (108.6 kg)  02/27/20 235 lb (106.6 kg)    Glycemic control:   Lab Results  Component Value Date   HGBA1C 6.0 (A) 06/29/2020   HGBA1C 6.6 (H) 02/24/2020   HGBA1C 7.1 (H) 10/30/2019   Lab Results  Component Value Date   MICROALBUR 0.8 08/23/2019   LDLCALC 79 08/23/2019   CREATININE 0.60 02/24/2020   No results found for: [facility]  Lab Results  Component Value Date   FRUCTOSAMINE 219 12/23/2019      Allergies as of 06/29/2020      Reactions   Jardiance [empagliflozin] Anaphylaxis, Itching, Rash, Other (See Comments)   Yeast infection   Atorvastatin Other (See Comments)   REACTION: mylagias   Pravastatin Sodium Other (See Comments)   REACTION: myalgias   Sulfa Antibiotics Hives      Medication List       Accurate as of Jun 29, 2020  3:48 PM. If you have any questions, ask your nurse or doctor.        acetaminophen 650 MG CR tablet Commonly known as: TYLENOL Take 1,300 mg by mouth as needed for pain.   aspirin 81 MG  tablet Take 81 mg by mouth daily.   carvedilol 6.25 MG tablet Commonly known as: COREG TAKE 1 TABLET BY MOUTH TWICE DAILY WITH A MEAL.   cholecalciferol 1000 units tablet Commonly known as: VITAMIN D Take 1,000 Units by mouth daily.   Entresto 24-26 MG Generic drug: sacubitril-valsartan Take 1 tablet by mouth twice daily   ezetimibe 10 MG tablet Commonly known as: ZETIA Take 1 tablet (10 mg total) by mouth daily.   furosemide 40 MG tablet Commonly known as: LASIX TAKE ONE TO TWO TABLETS BY MOUTH DAILY AS DIRECTED   gabapentin 100 MG capsule Commonly known as: NEURONTIN Take 1 capsule (100 mg total) by mouth 3 (three) times daily as needed.   glucose blood test strip Use OneTouch Verio Test strips to check blood sugar 3 times weekly, alternatiing between fasting and 2 hours after meals. Dx code E11.65   metFORMIN 500 MG 24 hr tablet Commonly known as: GLUCOPHAGE-XR TAKE 3 TABLETS BY MOUTH ONCE DAILY WITH SUPPER    multivitamin tablet Take 1 tablet by mouth daily.   OneTouch Delica Lancets 86V Misc Use OneTouch Delica lancets to check blood sugar 3 times weekly, alternating between fasting and 2 hours after meals.   OneTouch Verio Flex System w/Device Kit Use onetouch verio flex meter to check blood sugar 3 times weekly, alternating between fasting and 2 hours after meals.   Pfizer-BioNTech COVID-19 Vacc 30 MCG/0.3ML injection Generic drug: COVID-19 mRNA vaccine (Pfizer) INJECT AS DIRECTED   rosuvastatin 5 MG tablet Commonly known as: CRESTOR Take 1 tablet by mouth once daily   spironolactone 25 MG tablet Commonly known as: ALDACTONE Take 1/2 (one-half) tablet by mouth once daily   vitamin C 500 MG tablet Commonly known as: ASCORBIC ACID Take 2,000 mg by mouth as needed.       Allergies:  Allergies  Allergen Reactions  . Jardiance [Empagliflozin] Anaphylaxis, Itching, Rash and Other (See Comments)    Yeast infection  . Atorvastatin Other (See Comments)    REACTION: mylagias  . Pravastatin Sodium Other (See Comments)    REACTION: myalgias  . Sulfa Antibiotics Hives    Past Medical History:  Diagnosis Date  . Anxiety   . Aortic stenosis, mild    echo 2017  . Breast cancer ([place]) 1996   left breast  . Carpal tunnel syndrome   . Cataract   . Chronic diastolic CHF (congestive heart failure) (HCC)    NYHA class II  . Diabetes mellitus without complication ([facility])   . Hyperlipidemia   . Hypertension   . Left bundle branch block (LBBB)   . Low back pain   . Neuropathy   . NICM (nonischemic cardiomyopathy) ([place])    EF 30%, cath 05/20/2015 clean coronary.  EF resolved by echo with EF 55%  . OSA on CPAP   . Prediabetes   . Pulmonary HTN ([place]) 05/2015   Severe with PASP 67/46mHg - resolved on followup echo  . PVC's (premature ventricular contractions) 11/04/2015  . Sleep apnea    cpap    Past Surgical History:  Procedure Laterality Date  . BREAST SURGERY    . CARDIAC  CATHETERIZATION N/A 05/20/2015   Procedure: Right/Left Heart Cath and Coronary Angiography;  Surgeon: TTroy Sine MD;  [facility];  Service: Cardiovascular;  Laterality: N/A;  . CATARACT EXTRACTION W/ INTRAOCULAR LENS  IMPLANT, BILATERAL    . COLONOSCOPY  2018  . DILATION AND CURETTAGE OF UTERUS    . lumpectomy  left breast    . POLYPECTOMY    . UTERINE FIBROID SURGERY      Family History  Problem Relation Age of Onset  . Arthritis Mother   . Depression Mother   . Hearing loss Mother   . Hyperlipidemia Mother   . Miscarriages / [place] Mother   . Colon cancer Mother 69       2010  . Colon polyps Mother   . Alcohol abuse Father   . Cancer Father        PROSTATE  . Depression Father   . Hypertension Father   . Hyperlipidemia Father   . Drug abuse Brother   . Early death Maternal Grandmother   . Cancer Paternal Grandmother   . Heart attack Neg Hx   . Diabetes Neg Hx   . Esophageal cancer Neg Hx   . Rectal cancer Neg Hx   . Stomach cancer Neg Hx     Social History:  reports that she quit smoking about 11 years ago. Her smoking use included cigarettes. She has never used smokeless tobacco. She reports current alcohol use. She reports that she does not use drugs.   Review of Systems    Lipid history: She has been treated with Crestor and Zetia by her PCP with the following results    Lab Results  Component Value Date   CHOL 156 08/23/2019   HDL 59 08/23/2019   LDLCALC 79 08/23/2019   TRIG 99 08/23/2019   CHOLHDL 2.6 08/23/2019        CARDIAC: Last ejection fraction on echo 55%     Hypertension: Has been present, is on low-dose Coreg and spironolactone as well as Entresto Blood pressure history:  BP Readings from Last 3 Encounters:  06/29/20 126/82  04/15/20 100/60  02/21/20 131/74    Most recent eye exam was in 2021  Most recent foot exam: 8/21  She had COVID infection diagnosed on 02/17/2020  LABS:  Office Visit on 06/29/2020   Component Date Value Ref Range Status  . Hemoglobin A1C 06/29/2020 6.0* 4.0 - 5.6 % Final    Physical Examination:  BP 126/82   Pulse 70   Ht 5' 4"  (1.626 m)   Wt 242 lb 9.6 oz (110 kg)   SpO2 97%   BMI 41.64 kg/m       ASSESSMENT:  Diabetes type 2 with BMI over 40  See history of present illness for detailed discussion of current diabetes assessment and problems identified  A1c is improved at 6%  Continues to have good control with taking 3 tablets of metformin which she is tolerating Highest fasting blood sugar recently 131 at home She is doing very well with her diet usually but appears to be gaining weight gradually Only rarely when I have a high postprandial reading  Urine microalbumin normal  History of hypercholesterolemia: Needs follow-up labs, currently not scheduled to see PCP until September  PLAN:    She needs to try and do more chair exercises Continue metformin unchanged She will look into the cost of Trulicity or Ozempic since long-term may benefit from significant weight loss with using a GLP-1 drug; she is unlikely to tolerate SGLT2 drugs because of history of severe candidiasis with Jardiance  Follow-up in 3 months   There are no Patient Instructions on file for this visit.     Elayne Snare 06/29/2020, 3:48 PM   Note: This office note was prepared with Dragon voice recognition system technology. Any transcriptional errors that  result from this process are unintentional.  Addendum: Urine microalbumin, lipids normal, creatinine 0.7

## 2020-06-29 ENCOUNTER — Other Ambulatory Visit: Payer: Self-pay | Admitting: Endocrinology

## 2020-06-29 ENCOUNTER — Encounter: Payer: Self-pay | Admitting: Endocrinology

## 2020-06-29 ENCOUNTER — Other Ambulatory Visit: Payer: Self-pay

## 2020-06-29 ENCOUNTER — Ambulatory Visit: Payer: Medicare Other | Admitting: Endocrinology

## 2020-06-29 DIAGNOSIS — E782 Mixed hyperlipidemia: Secondary | ICD-10-CM

## 2020-06-29 DIAGNOSIS — E1169 Type 2 diabetes mellitus with other specified complication: Secondary | ICD-10-CM | POA: Diagnosis not present

## 2020-06-29 LAB — MICROALBUMIN / CREATININE URINE RATIO
Creatinine,U: 56.3 mg/dL
Microalb Creat Ratio: 1.2 mg/g (ref 0.0–30.0)
Microalb, Ur: <0.7 mg/dL (ref 0.0–1.9)

## 2020-06-29 LAB — LIPID PANEL
Cholesterol: 149 mg/dL (ref 0–200)
HDL: 61.7 mg/dL (ref >39.00)
LDL Cholesterol: 74 mg/dL (ref 0–99)
NonHDL: 87.79
Total CHOL/HDL Ratio: 2
Triglycerides: 70 mg/dL (ref 0.0–149.0)
VLDL: 14 mg/dL (ref 0.0–40.0)

## 2020-06-29 LAB — BASIC METABOLIC PANEL
BUN: 20 mg/dL (ref 6–23)
CO2: 27 mEq/L (ref 19–32)
Calcium: 9.7 mg/dL (ref 8.4–10.5)
Chloride: 103 mEq/L (ref 96–112)
Creatinine, Ser: 0.7 mg/dL (ref 0.40–1.20)
GFR: 90.69 mL/min (ref >60.00)
Glucose, Bld: 91 mg/dL (ref 70–99)
Potassium: 3.6 mEq/L (ref 3.5–5.1)
Sodium: 139 mEq/L (ref 135–145)

## 2020-06-29 LAB — POCT GLYCOSYLATED HEMOGLOBIN (HGB A1C): Hemoglobin A1C: 6 % — AB (ref 4.0–5.6)

## 2020-06-29 NOTE — Patient Instructions (Signed)
Check cost of Trulicity and Ozempic

## 2020-06-30 ENCOUNTER — Telehealth: Payer: Self-pay | Admitting: Endocrinology

## 2020-06-30 NOTE — Telephone Encounter (Signed)
She can try a sample of Ozempic 0.25 mg weekly for 4 weeks and then 0.5 mg weekly.  If she does well with this she can call for 90-day prescription.  Her friend Silva Bandy can help her with the injection once a week

## 2020-06-30 NOTE — Telephone Encounter (Signed)
Patient called re: medication pricing is as follows:  Trulicity-Tier 3 .75 HX/5.0VW-97 day supply is $131.00/1 month is $47.00  Ozempic-Tier 3 is the same pricing as Trulicity  Patient requests to be called to let her know about new RX at ph# [phone]

## 2020-07-02 NOTE — Telephone Encounter (Signed)
No answer on cb, will try again

## 2020-07-13 ENCOUNTER — Other Ambulatory Visit: Payer: Self-pay | Admitting: Family Medicine

## 2020-07-14 ENCOUNTER — Other Ambulatory Visit: Payer: Self-pay

## 2020-07-14 ENCOUNTER — Ambulatory Visit: Payer: Medicare Other | Admitting: Cardiology

## 2020-07-14 ENCOUNTER — Encounter: Payer: Self-pay | Admitting: Cardiology

## 2020-07-14 VITALS — BP 108/64 | HR 69 | Ht 64.5 in | Wt 242.4 lb

## 2020-07-14 DIAGNOSIS — G4733 Obstructive sleep apnea (adult) (pediatric): Secondary | ICD-10-CM

## 2020-07-14 DIAGNOSIS — I5042 Chronic combined systolic (congestive) and diastolic (congestive) heart failure: Secondary | ICD-10-CM

## 2020-07-14 DIAGNOSIS — I350 Nonrheumatic aortic (valve) stenosis: Secondary | ICD-10-CM

## 2020-07-14 DIAGNOSIS — I10 Essential (primary) hypertension: Secondary | ICD-10-CM

## 2020-07-14 DIAGNOSIS — E785 Hyperlipidemia, unspecified: Secondary | ICD-10-CM

## 2020-07-14 MED ORDER — ROSUVASTATIN CALCIUM 5 MG PO TABS: 5.0000 mg | ORAL_TABLET | Freq: Every day | ORAL | 3 refills | Status: DC

## 2020-07-14 MED ORDER — FUROSEMIDE 40 MG PO TABS: ORAL_TABLET | ORAL | 3 refills | Status: DC

## 2020-07-14 MED ORDER — SPIRONOLACTONE 25 MG PO TABS: ORAL_TABLET | ORAL | 3 refills | Status: DC

## 2020-07-14 MED ORDER — ENTRESTO 24-26 MG PO TABS: 1.0000 | ORAL_TABLET | Freq: Two times a day (BID) | ORAL | 3 refills | Status: DC

## 2020-07-14 MED ORDER — CARVEDILOL 6.25 MG PO TABS: ORAL_TABLET | ORAL | 3 refills | Status: DC

## 2020-07-14 MED ORDER — EZETIMIBE 10 MG PO TABS: 10.0000 mg | ORAL_TABLET | Freq: Every day | ORAL | 3 refills | Status: DC

## 2020-07-14 NOTE — Progress Notes (Signed)
Date:  07/14/2020   ID:  [name], DOB 12/28/1955, MRN [id number]   PCP:  Jenna Luo, MD Cardiologist:  Fransico Him, MD Electrophysiologist:  None   Chief Complaint:  Chronic systolic CHF, HTN, OSA, DCM  History of Present Illness:    [name] is a 65 y.o. female with a hx of DCM with EF 45%, HTN, LBBB, obesityand obstructive sleep apnea, on CPAP. Stress test in 2015 with inf and ant wall defects and normal coronary arteries by cath EF 30-35% and severe pulmonary HTN (PASP 67/29mHg) and NSVT noted on Tele at time of hospitalization for cath. She had a 2D echo in August 2018which showed mildly reduced LVF with EF 40-45% with diffuse HK. Mild AS. Mean AV gradient was 13 mmHg.She also has a history of PACs and PVCs by event monitor and chronic lower extremity edema.She lives a relatively sendenatary lifestyle. She works as a bRecruitment consultantfor [employer] She was seen in July 2020 and was doing well.    SHe is here today for followup and is doing well.  She denies any chest pain or pressure, SOB, DOE, PND, orthopnea, LE edema, dizziness, palpitations or syncope. She is compliant with her meds and is tolerating meds with no SE.    She is doing well with her CPAP device and thinks that she has gotten used to it.  She tolerates the mask and feels the pressure is adequate.  Since going on CPAP she feels rested in the am and has no significant daytime sleepiness.  She denies any significant mouth or nasal dryness or nasal congestion.  She does not think that he snores.    Prior CV studies:   The following studies were reviewed today:  2D echo, PAP download  Past Medical History:  Diagnosis Date  . Anxiety   . Aortic stenosis, mild    echo 2017  . [place]   left breast  . Carpal tunnel syndrome   . Cataract   . Chronic diastolic CHF (congestive heart failure) (HCC)    NYHA class II  . [place]   . Hyperlipidemia    . Hypertension   . Left bundle branch block (LBBB)   . Low back pain   . Neuropathy   . NIC[place] ([place]mic [place]) ([place]    EF 30%, cath 05/20/2015 clean coronary.  EF resolved by echo with EF 55%  . OSA on CPAP   . Prediabetes   . [place]monary HTN ([place] 05/2015   Severe with PASP 67/345mg - resolved on followup echo  . PVC's (premature ventricular contractions) 11/04/2015  . Sleep apnea    cpap   Past Surgical History:  Procedure Laterality Date  . BREAST SURGERY    . CARDIAC CATHETERIZATION N/A 05/20/2015   Procedure: Right/Left Heart Cath and Coronary Angiography;  Surgeon: ThTroy SineMD;  [facility];  Service: Cardiovascular;  Laterality: N/A;  . CATARACT EXTRACTION W/ INTRAOCULAR LENS  IMPLANT, BILATERAL    . COLONOSCOPY  2018  . DILATION AND CURETTAGE OF UTERUS    . lumpectomy left breast    . POLYPECTOMY    . UTERINE FIBROID SURGERY       Current Meds  Medication Sig  . acetaminophen (TYLENOL) 650 MG CR tablet Take 1,300 mg by mouth as needed for pain.  . Marland Kitchenspirin 81 MG tablet Take 81 mg by mouth daily.  . Blood Glucose Monitoring Suppl (ONETOUCH VERIO FLEX  SYSTEM) w/Device KIT Use onetouch verio flex meter to check blood sugar 3 times weekly, alternating between fasting and 2 hours after meals.  . cholecalciferol (VITAMIN D) 1000 units tablet Take 1,000 Units by mouth daily.  Marland Kitchen COVID-19 mRNA vaccine, Pfizer, 30 MCG/0.3ML injection INJECT AS DIRECTED  . diclofenac Sodium (VOLTAREN) 1 % GEL Apply topically 4 (four) times daily. Arthritis  . gabapentin (NEURONTIN) 100 MG capsule Take 1 capsule (100 mg total) by mouth 3 (three) times daily as needed.  Marland Kitchen glucose blood test strip Use OneTouch Verio Test strips to check blood sugar 3 times weekly, alternatiing between fasting and 2 hours after meals. Dx code E11.65  . metFORMIN (GLUCOPHAGE-XR) 500 MG 24 hr tablet TAKE 3 TABLETS BY MOUTH ONCE DAILY WITH SUPPER  . Multiple Vitamin (MULTIVITAMIN) tablet  Take 1 tablet by mouth daily.   Glory Rosebush Delica Lancets 91M MISC Use OneTouch Delica lancets to check blood sugar 3 times weekly, alternating between fasting and 2 hours after meals.  . vitamin C (ASCORBIC ACID) 500 MG tablet Take 2,000 mg by mouth as needed.   . [DISCONTINUED] carvedilol (COREG) 6.25 MG tablet TAKE 1 TABLET BY MOUTH TWICE DAILY WITH A MEAL.  . [DISCONTINUED] ENTRESTO 24-26 MG Take 1 tablet by mouth twice daily  . [DISCONTINUED] ezetimibe (ZETIA) 10 MG tablet Take 1 tablet (10 mg total) by mouth daily.  . [DISCONTINUED] furosemide (LASIX) 40 MG tablet TAKE ONE TO TWO TABLETS BY MOUTH DAILY AS DIRECTED  . [DISCONTINUED] rosuvastatin (CRESTOR) 5 MG tablet Take 1 tablet by mouth once daily  . [DISCONTINUED] spironolactone (ALDACTONE) 25 MG tablet Take 1/2 (one-half) tablet by mouth once daily     Allergies:   Jardiance [empagliflozin], Atorvastatin, Pravastatin sodium, and Sulfa antibiotics   Social History   Tobacco Use  . Smoking status: Former Smoker    Types: Cigarettes    Quit date: 06/18/2009    Years since quitting: 11.0  . Smokeless tobacco: Never Used  Vaping Use  . Vaping Use: Never used  Substance Use Topics  . Alcohol use: Yes    Comment: once every 2 months  . Drug use: No     Family Hx: The patient's family history includes Alcohol abuse in her father; Arthritis in her mother; Cancer in her father and paternal grandmother; Colon cancer (age of onset: 73) in her mother; Colon polyps in her mother; Depression in her father and mother; Drug abuse in her brother; Early death in her maternal grandmother; Hearing loss in her mother; Hyperlipidemia in her father and mother; Hypertension in her father; Miscarriages / [place] in her mother. There is no history of Heart attack, Diabetes, Esophageal cancer, Rectal cancer, or Stomach cancer.  ROS:   Please see the history of present illness.     All other systems reviewed and are negative.   Labs/Other Tests  and Data Reviewed:    Recent Labs: 08/23/2019: Hemoglobin 14.3; Platelets 361 02/24/2020: ALT 15 06/29/2020: BUN 20; Creatinine, Ser 0.70; Potassium 3.6; Sodium 139   Recent Lipid Panel Lab Results  Component Value Date/Time   CHOL 149 06/29/2020 04:14 PM   CHOL 150 09/13/2018 10:00 AM   TRIG 70.0 06/29/2020 04:14 PM   HDL 61.70 06/29/2020 04:14 PM   HDL 56 09/13/2018 10:00 AM   CHOLHDL 2 06/29/2020 04:14 PM   LDLCALC 74 06/29/2020 04:14 PM   LDLCALC 79 08/23/2019 10:13 AM    Wt Readings from Last 3 Encounters:  07/14/20 242 lb 6.4 oz (110  kg)  06/29/20 242 lb 9.6 oz (110 kg)  04/15/20 239 lb 6.4 oz (108.6 kg)     Objective:    Vital Signs:  BP 108/64   Pulse 69   Ht 5' 4.5" (1.638 m)   Wt 242 lb 6.4 oz (110 kg)   SpO2 96%   BMI 40.97 kg/m      ASSESSMENT & PLAN:    1.  Chronic combined systolic/diastolic CHF -2D echo [id number] mildly reduced LVF with EF 45-50% with global HK  -cardiac cath showed normal coronary arteries -she does not appear volume overloaded on exam today -Continue prescription drug management with Entresto 24-8m BID, lasix 40-877mdaily, Carvedilol 6.251mID and spiro 12.5mg15mily with PRN refills  2.  Aortic stenosis  -mild AS by echo 08/2019 with mean AVG 14mm56mnd dimensionless index 0.40, peak velocity 240cm/s and AVA 1.41cm2.  -repeat echo in July -she is asymptomatic  3.  HTN -Her BP is controlled on exam today -Continue prescription drug management with Carvedilol 6.25mg 68m Entresto 24-26mg B34mnd spiro 12.5mg dai38m-I have personally reviewed and interpreted outside labs performed by patient's PCP which showed SCR 0.7, K+ 3.6   4. OSA - The patient is tolerating PAP therapy well without any problems. The PAP download performed by his DME was personally reviewed and interpreted by me today and showed an AHI of 3.4/hr on 13 cm H2O with 73% compliance in using more than 4 hours nightly.  The patient has been using and benefiting  from PAP use and will continue to benefit from therapy.  -Continue prescription drug management with CPAP and I have placed an order for CPAP supplies through the DME for 1 year  5.  Morbid obesity -I have encouraged her to get into a routine exercise program and cut back on carbs and portions.   6. HLD -LDL goal < 100 I have personally reviewed and interpreted outside labs performed by patient's PCP which showed LDL 74, HDL 61 and TAG 70 and ALT 15 in 2022 -Continue prescription drug management with Crestor 5mg dail101mnd Zetia 10mg dail92mMedication Adjustments/Labs and Tests Ordered: Current medicines are reviewed at length with the patient today.  Concerns regarding medicines are outlined above.  Tests Ordered: No orders of the defined types were placed in this encounter.  Medication Changes: Meds ordered this encounter  Medications  . carvedilol (COREG) 6.25 MG tablet    Sig: TAKE 1 TABLET BY MOUTH TWICE DAILY WITH A MEAL.    Dispense:  180 tablet    Refill:  3  . sacubitril-valsartan (ENTRESTO) 24-26 MG    Sig: Take 1 tablet by mouth 2 (two) times daily.    Dispense:  180 tablet    Refill:  3  . ezetimibe (ZETIA) 10 MG tablet    Sig: Take 1 tablet (10 mg total) by mouth daily.    Dispense:  90 tablet    Refill:  3  . furosemide (LASIX) 40 MG tablet    Sig: TAKE ONE TO TWO TABLETS BY MOUTH DAILY AS DIRECTED    Dispense:  180 tablet    Refill:  3  . rosuvastatin (CRESTOR) 5 MG tablet    Sig: Take 1 tablet (5 mg total) by mouth daily.    Dispense:  90 tablet    Refill:  3  . spironolactone (ALDACTONE) 25 MG tablet    Sig: Take 1/2 (one-half) tablet by mouth once daily    Dispense:  45  tablet    Refill:  3    Disposition:  Follow up in 6 month(s)  Signed, Fransico Him, MD  07/14/2020 11:20 AM    [facility]

## 2020-07-14 NOTE — Patient Instructions (Signed)

## 2020-07-21 DIAGNOSIS — R3915 Urgency of urination: Secondary | ICD-10-CM | POA: Diagnosis not present

## 2020-07-21 DIAGNOSIS — R3121 Asymptomatic microscopic hematuria: Secondary | ICD-10-CM | POA: Diagnosis not present

## 2020-07-23 DIAGNOSIS — G4733 Obstructive sleep apnea (adult) (pediatric): Secondary | ICD-10-CM | POA: Diagnosis not present

## 2020-08-03 DIAGNOSIS — R3121 Asymptomatic microscopic hematuria: Secondary | ICD-10-CM | POA: Diagnosis not present

## 2020-08-05 DIAGNOSIS — R3121 Asymptomatic microscopic hematuria: Secondary | ICD-10-CM | POA: Diagnosis not present

## 2020-08-21 ENCOUNTER — Telehealth: Payer: Self-pay | Admitting: Cardiology

## 2020-08-21 NOTE — Telephone Encounter (Signed)
Patient calling the office for samples of medication:   1.  What medication and dosage are you requesting samples for? sacubitril-valsartan (ENTRESTO) 24-26 MG  2.  Are you currently out of this medication? Pt is completely out of this medicine

## 2020-08-21 NOTE — Telephone Encounter (Signed)
**Note De-Identified [name] Obfuscation** The pt states that she has a [employer] pt asst application already and will bring it to the office to drop off once she completes her part of it but for now she is in need of her Entresto.  She is advised that we will leave her 2 weeks worth of Entresto 26-24 mg samples in the front office at Dr Landis Gandy office at [facility] on [address] in [place] for her to pick up and I did request that she get her completed [employer] pt asst application for Entresto back to [place] ASAP.  She verbalized understanding and thanked me for our assistance.

## 2020-08-21 NOTE — Telephone Encounter (Signed)
Will do. Thanks.

## 2020-08-21 NOTE — Telephone Encounter (Signed)
Hey Lynn, LPN, can you please advise on this matter? Thanks  ?

## 2020-09-03 NOTE — Telephone Encounter (Signed)
**Note De-Identified Jolly Bleicher Obfuscation** The pt states that she has been trying to go through [employer] to get a [employer] pt assistance application and is now almost out of Entresto.  I advised her to contact [employer] pt asst Foundation and I gave her their phone number so she can call them with questions about their Coin program and to request that they mail her an application to her home ASAP or to type [facility] patient assistance application/[employer] into her search engine and she should be able to print an application off that way as well.  She is aware to complete her part of the application, obtain required documents per [employer] Pt Asst, and to bring all to Dr Theodosia Blender office at [facility] on [address].  in [place] to drop off and that we will take care of the provider page and will fax all to [employer] pt asst Foundation.  She is also aware that we are leaving her enough Entresto 24-26 mg samples in the front office for her to pick up to last another 2 weeks.  She verbalized understanding to all info given and thanked me for out assistance.

## 2020-09-03 NOTE — Telephone Encounter (Signed)
1 bottle of Entresto 24-26 has been put at the front desk for pt.

## 2020-09-04 ENCOUNTER — Other Ambulatory Visit: Payer: Self-pay

## 2020-09-04 ENCOUNTER — Ambulatory Visit (HOSPITAL_COMMUNITY): Payer: Medicare Other | Attending: Cardiovascular Disease

## 2020-09-04 ENCOUNTER — Telehealth: Payer: Self-pay | Admitting: *Deleted

## 2020-09-04 DIAGNOSIS — I350 Nonrheumatic aortic (valve) stenosis: Secondary | ICD-10-CM | POA: Insufficient documentation

## 2020-09-04 LAB — ECHOCARDIOGRAM COMPLETE
AR max vel: 1.19 cm2
AV Area VTI: 1.25 cm2
AV Area mean vel: 1.35 cm2
AV Mean grad: 11 mmHg
AV Peak grad: 20.4 mmHg
Ao pk vel: 2.26 m/s
Area-P 1/2: 3.06 cm2
S' Lateral: 4.1 cm

## 2020-09-04 NOTE — Telephone Encounter (Signed)
Gae Bon this patient is requesting the last 90 days of her CPAP readings. If you have any questions you can contact her at the number provided.

## 2020-09-07 ENCOUNTER — Telehealth: Payer: Self-pay | Admitting: Cardiology

## 2020-09-07 NOTE — Telephone Encounter (Signed)
Pt is requesting results from Echo and  most recent 90 day CPAP reading. Please advise pt further

## 2020-09-07 NOTE — Telephone Encounter (Signed)
Sueanne Margarita, MD  09/05/2020 10:48 AM EDT Back to Top     Echo sh owed mildly reduced LVF with EF 40-45% with mildly enlarged LV and increased stiffness of heart muscle, moderate LAE, mild to moderate AS -repeatecho in 1 year.  Echo stable   The patient has been notified of the result and verbalized understanding.  All questions (if any) were answered. Antonieta Iba, RN 09/07/2020 12:25 PM   Patient also needs a compliance report from her last 90s days from her CPAP machine. Patient will come by the office to pick it up.

## 2020-09-08 ENCOUNTER — Telehealth: Payer: Self-pay

## 2020-09-08 NOTE — Telephone Encounter (Signed)
Paperwork signed and faxed.

## 2020-09-08 NOTE — Telephone Encounter (Signed)
**Note De-Identified Storm Sovine Obfuscation** The pt left her completed [employer] pt asst application for Entresto at the office. I have completed the provider page of the application and have emailed all to our Triage Team Lead so she can obtain Dr Jacolyn Reedy signature (Dr Radford Pax is out of the office this week), date it , and to fax all to [facility] at the fax number written on the cover letter included or to place in Medical Records to be faxed.

## 2020-09-16 ENCOUNTER — Other Ambulatory Visit: Payer: Self-pay | Admitting: Endocrinology

## 2020-09-21 NOTE — Telephone Encounter (Signed)
Paperwork completed and faxed.  °

## 2020-09-21 NOTE — Telephone Encounter (Signed)
Patient calling to follow up. She states she has only been taking 1 tablet daily for 4 days to make the medication last. 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

## 2020-09-21 NOTE — Telephone Encounter (Addendum)
**Note De-Identified Kenlei Safi Obfuscation** I called [employer] Pt Asst and was advised by Cori Razor that they have not received the pts application for Entresto. I will ask Dr Darliss Ridgel nurse to obtain his signature tomorrow as he is DOD and Dr Radford Pax is not in the office this week.   We are leaving her 2 weeks of Entresto 24-26 mg samples in the front office for her to pick up. LMTCB on the pts VM.

## 2020-09-24 NOTE — Telephone Encounter (Signed)
**Note De-Identified [name] Obfuscation** Letter received [name] fax from [employer] pt asst [employer] stating that they have approved the pt for asst with Entresto. Approval is valid until 02/13/21 Pt [id number]  The letter states that they have notified the pt of this approval as well.

## 2020-09-28 ENCOUNTER — Encounter: Payer: Self-pay | Admitting: Nurse Practitioner

## 2020-09-28 ENCOUNTER — Telehealth (INDEPENDENT_AMBULATORY_CARE_PROVIDER_SITE_OTHER): Payer: Medicare Other | Admitting: Nurse Practitioner

## 2020-09-28 VITALS — Ht 64.0 in | Wt 239.0 lb

## 2020-09-28 DIAGNOSIS — B372 Candidiasis of skin and nail: Secondary | ICD-10-CM | POA: Diagnosis not present

## 2020-09-28 DIAGNOSIS — J0140 Acute pansinusitis, unspecified: Secondary | ICD-10-CM | POA: Diagnosis not present

## 2020-09-28 MED ORDER — CETIRIZINE HCL 10 MG PO TABS: 10.0000 mg | ORAL_TABLET | Freq: Every day | ORAL | 0 refills | Status: DC

## 2020-09-28 MED ORDER — AZITHROMYCIN 250 MG PO TABS: 250.0000 mg | ORAL_TABLET | Freq: Every day | ORAL | 0 refills | Status: DC

## 2020-09-28 MED ORDER — FLUTICASONE PROPIONATE 50 MCG/ACT NA SUSP: 2.0000 | Freq: Every day | NASAL | 0 refills | Status: DC

## 2020-09-28 MED ORDER — CLOTRIMAZOLE 1 % EX CREA: 1.0000 "application " | TOPICAL_CREAM | Freq: Two times a day (BID) | CUTANEOUS | 0 refills | Status: DC

## 2020-09-28 NOTE — Progress Notes (Signed)
Virtual Visit via Video Note  I connected withNAME@ on 09/28/20 at  9:30 AM EDT by a video enabled telemedicine application and verified that I am speaking with the correct person using two identifiers.  Location: Patient:Home Provider: Office Participants: patient and provider  I discussed the limitations of evaluation and management by telemedicine and the availability of in person appointments. I also discussed with the patient that there may be a patient responsible charge related to this service. The patient expressed understanding and agreed to proceed.  [id number] congestion and rash  History of Present Illness: Sinusitis This is a new problem. The current episode started more than 1 month ago. The problem has been gradually worsening since onset. There has been no fever. Associated symptoms include congestion and sinus pressure. Pertinent negatives include no chills, coughing, diaphoresis, ear pain, headaches, hoarse voice, neck pain, shortness of breath, sneezing, sore throat or swollen glands. Past treatments include saline sprays and spray decongestants. The treatment provided no relief.  Rash This is a recurrent problem. The current episode started more than 1 month ago. The problem has been waxing and waning since onset. The affected locations include the chest (under breast). The rash is characterized by itchiness, redness and peeling. She was exposed to nothing. Associated symptoms include congestion and rhinorrhea. Pertinent negatives include no anorexia, cough, diarrhea, eye pain, facial edema, fatigue, fever, joint pain, nail changes, shortness of breath, sore throat or vomiting.  She has not been about to use CPAP machine due to sinus congestion Home fasting glucose in last 4days: 88, 107, 112, 103. Last hgbA1c of 6.3% per patient. DM managed by Dr. Dwyane Dee. Denies any Oral Abx? OTC used: saline and afrin  CPAP machine hygiene: uses distilled water in her water chamber, cleans  mask and tube daily with water. She let tube and mask air dry. Supplies were replaced 78monthago  Observations/Objective: Physical Exam Vitals reviewed.  Pulmonary:     Effort: Pulmonary effort is normal.  Skin:    Findings: Rash present. Rash is macular.       Neurological:     Mental Status: She is alert and oriented to person, place, and time.   Assessment and Plan: YKassadeewas seen today for acute visit.  Diagnoses and all orders for this visit:  Acute non-recurrent pansinusitis -     fluticasone (FLONASE) 50 MCG/ACT nasal spray; Place 2 sprays into both nostrils daily. -     azithromycin (ZITHROMAX Z-PAK) 250 MG tablet; Take 1 tablet (250 mg total) by mouth daily. Take 2tabs on first day, then 1tab once a day till complete -     Discontinue: cetirizine (ZYRTEC) 10 MG tablet; Take 1 tablet (10 mg total) by mouth daily. -     cetirizine (ZYRTEC) 10 MG tablet; Take 1 tablet (10 mg total) by mouth daily.  Candidiasis, intertrigo -     clotrimazole (LOTRIMIN) 1 % cream; Apply 1 application topically 2 (two) times daily.  Follow Up Instructions: Keep skin fold clean and dry as much as possible. Stop zinc oxide cream and start clotrimazole cream.   I discussed the assessment and treatment plan with the patient. The patient was provided an opportunity to ask questions and all were answered. The patient agreed with the plan and demonstrated an understanding of the instructions.   The patient was advised to call back or seek an in-person evaluation if the symptoms worsen or if the condition fails to improve as anticipated.  CWilfred Lacy NP

## 2020-09-28 NOTE — Patient Instructions (Signed)
Keep skin fold clean and dry as much as possible. Stop zinc oxide cream and start clotrimazole cream.

## 2020-09-29 DIAGNOSIS — G4733 Obstructive sleep apnea (adult) (pediatric): Secondary | ICD-10-CM | POA: Diagnosis not present

## 2020-10-15 ENCOUNTER — Other Ambulatory Visit: Payer: Medicare Other

## 2020-10-21 ENCOUNTER — Ambulatory Visit: Payer: Medicare Other | Admitting: Family Medicine

## 2020-10-26 ENCOUNTER — Telehealth: Payer: Self-pay | Admitting: Endocrinology

## 2020-10-30 ENCOUNTER — Ambulatory Visit: Payer: Medicare Other | Admitting: Endocrinology

## 2020-11-09 ENCOUNTER — Encounter: Payer: Medicare Other | Admitting: Physician Assistant

## 2020-11-18 DIAGNOSIS — R921 Mammographic calcification found on diagnostic imaging of breast: Secondary | ICD-10-CM | POA: Diagnosis not present

## 2020-11-18 DIAGNOSIS — R928 Other abnormal and inconclusive findings on diagnostic imaging of breast: Secondary | ICD-10-CM | POA: Diagnosis not present

## 2020-11-18 DIAGNOSIS — Z853 Personal history of malignant neoplasm of breast: Secondary | ICD-10-CM | POA: Diagnosis not present

## 2020-11-18 LAB — HM MAMMOGRAPHY

## 2020-11-25 ENCOUNTER — Telehealth: Payer: Self-pay | Admitting: Endocrinology

## 2020-11-25 ENCOUNTER — Other Ambulatory Visit: Payer: Self-pay | Admitting: Endocrinology

## 2020-11-25 MED ORDER — METFORMIN HCL ER 500 MG PO TB24: ORAL_TABLET | ORAL | 0 refills | Status: DC

## 2020-11-25 NOTE — Telephone Encounter (Signed)
MEDICATION: Metformin  PHARMACY:  [facility] on [address]  HAS THE PATIENT CONTACTED [place]?  yes  IS THIS A 90 DAY SUPPLY : yes  IS PATIENT OUT OF MEDICATION: will be before next appt on 12/02/20  IF NOT; HOW MUCH IS LEFT:   LAST APPOINTMENT DATE: @10 /01/2021  NEXT APPOINTMENT DATE:@10 /19/2022  DO WE HAVE YOUR PERMISSION TO LEAVE A DETAILED MESSAGE?: yes   OTHER COMMENTS:    **Let patient know to contact pharmacy at the end of the day to make sure medication is ready. **  ** Please notify patient to allow 48-72 hours to process**  **Encourage patient to contact the pharmacy for refills or they can request refills through [facility]**

## 2020-11-25 NOTE — Telephone Encounter (Signed)
Script has been sent.

## 2020-11-25 NOTE — Telephone Encounter (Signed)
Patient notified

## 2020-12-02 ENCOUNTER — Encounter: Payer: Self-pay | Admitting: Endocrinology

## 2020-12-02 ENCOUNTER — Ambulatory Visit (INDEPENDENT_AMBULATORY_CARE_PROVIDER_SITE_OTHER): Payer: Medicare Other | Admitting: Endocrinology

## 2020-12-02 ENCOUNTER — Other Ambulatory Visit: Payer: Self-pay

## 2020-12-02 VITALS — BP 122/80 | HR 69 | Ht 64.5 in | Wt 237.2 lb

## 2020-12-02 DIAGNOSIS — I10 Essential (primary) hypertension: Secondary | ICD-10-CM

## 2020-12-02 DIAGNOSIS — R202 Paresthesia of skin: Secondary | ICD-10-CM | POA: Diagnosis not present

## 2020-12-02 DIAGNOSIS — E1169 Type 2 diabetes mellitus with other specified complication: Secondary | ICD-10-CM | POA: Diagnosis not present

## 2020-12-02 LAB — POCT GLYCOSYLATED HEMOGLOBIN (HGB A1C): Hemoglobin A1C: 6.2 % — AB (ref 4.0–5.6)

## 2020-12-02 NOTE — Progress Notes (Signed)
Patient ID: [name], female   DOB: January 19, 1956, 65 y.o.   MRN: [id number]           Reason for Appointment: Follow-up for Type 2 Diabetes     History of Present Illness:          Date of diagnosis of type 2 diabetes mellitus: 09/2017       Background history:   Her diabetes was diagnosed with an A1c of 6.8 Review of her records indicate that probably that her highest fasting blood sugar has been only 130 outside the hospital settings  Recent history:   Most recent A1c is 6.2, previously 6.0 Previous range 6.4-7.1  Fructosamine is last 219  Non-insulin hypoglycemic drugs the patient is taking are: Metformin ER 500 mg 3 tablets daily  Current management, blood sugar patterns and problems identified: She is following up since her last visit in 5/22  Although she is checking her blood sugars up to twice a day she appears to be using the sugar levels to dose her medication  She does not understand that her metformin is extended release and she should not adjust the dose on a daily basis  She does tend to have occasional diarrhea from metformin and does not take all 3 tablets at the same time now  She thinks she is trying to be a little more active with some walking and weights for her arms  Her weight is down only about 2 pounds  Highest blood sugar at home is 134  Since her schedule is quite variable difficult to know which of her blood sugars are fasting and which are after meals  Also with her meter sometimes she will have falsely low blood sugars and she rechecks them  Overall control is improved with continuing metformin   Last consultation with dietitian: 10/21       Side effects from medications have been: Vaginitis from Jardiance               Exercise:  minimal  Glucose monitoring:  Using One Touch Verio, last 30-day data:   PRE-MEAL Mornings Lunch Dinner 9 PM-5 AM Overall  Glucose range:     71-134  Mean/median: 102   110 105   Previously:  PRE-MEAL  Fasting Lunch Dinner Bedtime Overall  Glucose range:  91-131  101-200     Mean/median:  111  123  105  106 112   POST-MEAL PC Breakfast PC Lunch PC Dinner  Glucose range:     Mean/median:         Weight history: Highest 262   Wt Readings from Last 3 Encounters:  12/02/20 237 lb 3.2 oz (107.6 kg)  09/28/20 239 lb (108.4 kg)  07/14/20 242 lb 6.4 oz (110 kg)    Glycemic control:   Lab Results  Component Value Date   HGBA1C 6.2 (A) 12/02/2020   HGBA1C 6.0 (A) 06/29/2020   HGBA1C 6.6 (H) 02/24/2020   Lab Results  Component Value Date   MICROALBUR <0.7 06/29/2020   LDLCALC 74 06/29/2020   CREATININE 0.70 06/29/2020   Lab Results  Component Value Date   MICRALBCREAT 1.2 06/29/2020    Lab Results  Component Value Date   FRUCTOSAMINE 219 12/23/2019      Allergies as of 12/02/2020       Reactions   Jardiance [empagliflozin] Anaphylaxis, Itching, Rash, Other (See Comments)   Yeast infection   Atorvastatin Other (See Comments)   REACTION: mylagias   Pravastatin Sodium Other (  See Comments)   REACTION: myalgias   Sulfa Antibiotics Hives        Medication List        Accurate as of December 02, 2020  1:51 PM. If you have any questions, ask your nurse or doctor.          acetaminophen 650 MG CR tablet Commonly known as: TYLENOL Take 1,300 mg by mouth as needed for pain.   aspirin 81 MG tablet Take 81 mg by mouth daily.   azithromycin 250 MG tablet Commonly known as: Zithromax Z-Pak Take 1 tablet (250 mg total) by mouth daily. Take 2tabs on first day, then 1tab once a day till complete   BENADRYL ALLERGY PO Take by mouth.   carvedilol 6.25 MG tablet Commonly known as: COREG TAKE 1 TABLET BY MOUTH TWICE DAILY WITH A MEAL.   cetirizine 10 MG tablet Commonly known as: ZYRTEC Take 1 tablet (10 mg total) by mouth daily.   clotrimazole 1 % cream Commonly known as: LOTRIMIN Apply 1 application topically 2 (two) times daily.   diclofenac Sodium 1 %  Gel Commonly known as: VOLTAREN Apply topically 4 (four) times daily. Arthritis   Entresto 24-26 MG Generic drug: sacubitril-valsartan Take 1 tablet by mouth 2 (two) times daily.   ezetimibe 10 MG tablet Commonly known as: ZETIA Take 1 tablet (10 mg total) by mouth daily.   fluticasone 50 MCG/ACT nasal spray Commonly known as: FLONASE Place 2 sprays into both nostrils daily.   furosemide 40 MG tablet Commonly known as: LASIX TAKE ONE TO TWO TABLETS BY MOUTH DAILY AS DIRECTED   gabapentin 100 MG capsule Commonly known as: NEURONTIN Take 1 capsule (100 mg total) by mouth 3 (three) times daily as needed.   metFORMIN 500 MG 24 hr tablet Commonly known as: GLUCOPHAGE-XR TAKE 3 TABLETS BY MOUTH ONCE DAILY WITH SUPPER   multivitamin tablet Take 1 tablet by mouth daily.   OneTouch Delica Lancets 70W Misc Use OneTouch Delica lancets to check blood sugar 3 times weekly, alternating between fasting and 2 hours after meals.   OneTouch Verio Flex System w/Device Kit Use onetouch verio flex meter to check blood sugar 3 times weekly, alternating between fasting and 2 hours after meals.   OneTouch Verio test strip Generic drug: glucose blood USE 1 STRIP TO CHECK GLUCOSE THREE TIMES  A WEEK ALTERNATING BETWEN FASTING AND 2 HOURS AFTER MEALS   Pfizer-BioNTech COVID-19 Vacc 30 MCG/0.3ML injection Generic drug: COVID-19 mRNA vaccine (Pfizer) INJECT AS DIRECTED   rosuvastatin 5 MG tablet Commonly known as: CRESTOR Take 1 tablet (5 mg total) by mouth daily.   spironolactone 25 MG tablet Commonly known as: ALDACTONE Take 1/2 (one-half) tablet by mouth once daily   vitamin C 500 MG tablet Commonly known as: ASCORBIC ACID Take 2,000 mg by mouth as needed.        Allergies:  Allergies  Allergen Reactions   Jardiance [Empagliflozin] Anaphylaxis, Itching, Rash and Other (See Comments)    Yeast infection   Atorvastatin Other (See Comments)    REACTION: mylagias   Pravastatin  Sodium Other (See Comments)    REACTION: myalgias   Sulfa Antibiotics Hives    Past Medical History:  Diagnosis Date   Anxiety    Aortic stenosis, mild    echo 2017   Breast cancer ([place]) 1996   left breast   Carpal tunnel syndrome    Cataract    Chronic diastolic CHF (congestive heart failure) (HCC)    NYHA  class II   Diabetes mellitus without complication (HCC)    Hyperlipidemia    Hypertension    Left bundle branch block (LBBB)    Low back pain    Neuropathy    NICM (nonischemic cardiomyopathy) ([place])    EF 30%, cath 05/20/2015 clean coronary.  EF resolved by echo with EF 55%   OSA on CPAP    Prediabetes    Pulmonary HTN ([place]) 05/2015   Severe with PASP 67/51mHg - resolved on followup echo   PVC's (premature ventricular contractions) 11/04/2015   Sleep apnea    cpap    Past Surgical History:  Procedure Laterality Date   BREAST SURGERY     CARDIAC CATHETERIZATION N/A 05/20/2015   Procedure: Right/Left Heart Cath and Coronary Angiography;  Surgeon: TTroy Sine MD;  [facility];  Service: Cardiovascular;  Laterality: N/A;   CATARACT EXTRACTION W/ INTRAOCULAR LENS  IMPLANT, BILATERAL     COLONOSCOPY  2018   DILATION AND CURETTAGE OF UTERUS     lumpectomy left breast     POLYPECTOMY     UTERINE FIBROID SURGERY      Family History  Problem Relation Age of Onset   Arthritis Mother    Depression Mother    Hearing loss Mother    Hyperlipidemia Mother    Miscarriages / [place]    Colon cancer Mother 80       2010   Colon polyps Mother    Alcohol abuse Father    Cancer Father        PROSTATE   Depression Father    Hypertension Father    Hyperlipidemia Father    Drug abuse Brother    Early death Maternal Grandmother    Cancer Paternal Grandmother    Heart attack Neg Hx    Diabetes Neg Hx    Esophageal cancer Neg Hx    Rectal cancer Neg Hx    Stomach cancer Neg Hx     Social History:  reports that she quit smoking about 11 years  ago. Her smoking use included cigarettes. She has never used smokeless tobacco. She reports current alcohol use. She reports that she does not use drugs.   Review of Systems    Lipid history: She has been treated with Crestor and Zetia by her PCP with the following results    Lab Results  Component Value Date   CHOL 149 06/29/2020   HDL 61.70 06/29/2020   LDLCALC 74 06/29/2020   TRIG 70.0 06/29/2020   CHOLHDL 2 06/29/2020        CARDIAC: Last ejection fraction on echo 55%     Hypertension: Has been controlled, is on low-dose Coreg and spironolactone 25 mg as well as Entresto Blood pressure history:  BP Readings from Last 3 Encounters:  12/02/20 122/80  07/14/20 108/64  06/29/20 126/82   Lab Results  Component Value Date   K 3.6 06/29/2020    Most recent eye exam was in 2021  Most recent foot exam: 10/22 No symptoms of numbness but occasionally may have some tingling in her lower legs  She had COVID infection diagnosed on 02/17/2020  LABS:  Office Visit on 12/02/2020  Component Date Value Ref Range Status   Hemoglobin A1C 12/02/2020 6.2 (A) 4.0 - 5.6 % Final    Physical Examination:  BP 122/80   Pulse 69   Ht 5' 4.5" (1.638 m)   Wt 237 lb 3.2 oz (107.6 kg)   SpO2 99%  BMI 40.09 kg/m   Diabetic Foot Exam - Simple   Simple Foot Form Diabetic Foot exam was performed with the following findings: Yes   Visual Inspection No deformities, no ulcerations, no other skin breakdown bilaterally: Yes Sensation Testing Intact to touch and monofilament testing bilaterally: Yes Pulse Check See comments: Yes Comments Pedal pulses not palpable        ASSESSMENT:  Diabetes type 2 with BMI around 40  See history of present illness for detailed discussion of current diabetes assessment and problems identified  A1c is stable at 6.2, previously was at 6%  Although her diabetes has not been Significantly out of control at diagnosis she continues to have good  control with the help of metformin She does try to watch her diet although sometimes may not have choices when she is eating out while traveling with her work  to have good control with taking 3 tablets of metformin which she is tolerating Highest fasting blood sugar recently 131 at home She is doing very well with her diet usually but appears to be gaining weight gradually Only rarely when I have a high postprandial reading  Symptoms of tingling in her legs: No sensory loss, may be mild early neuropathy  History of hypercholesterolemia: will recheck on next visit  PLAN:    She will stay on 3 metformin a day but preferably take 1 tablet on waking up and 2 tablets at night to avoid any diarrhea during the day She does not need to adjust the medication based on her glucose level She will continue check blood sugars at various times  Reminded her to continue same blood pressure medications Follow-up in 3 months   There are no Patient Instructions on file for this visit.     Elayne Snare 12/02/2020, 1:51 PM   Note: This office note was prepared with Dragon voice recognition system technology. Any transcriptional errors that result from this process are unintentional.  Addendum: Urine microalbumin, lipids normal, creatinine 0.7

## 2020-12-29 DIAGNOSIS — G4733 Obstructive sleep apnea (adult) (pediatric): Secondary | ICD-10-CM | POA: Diagnosis not present

## 2020-12-31 ENCOUNTER — Other Ambulatory Visit: Payer: Self-pay | Admitting: Physician Assistant

## 2021-01-13 DIAGNOSIS — R310 Gross hematuria: Secondary | ICD-10-CM | POA: Diagnosis not present

## 2021-01-21 ENCOUNTER — Telehealth: Payer: Self-pay

## 2021-01-21 MED ORDER — ENTRESTO 24-26 MG PO TABS: 1.0000 | ORAL_TABLET | Freq: Two times a day (BID) | ORAL | 3 refills | Status: DC

## 2021-01-21 NOTE — Telephone Encounter (Signed)
**Note De-Identified Keyton Bhat Obfuscation** The pt left her completed [employer] pt asst application for Entresto at the office with documents.  I have complete the providers page of her application with Dr Claudie Revering info as Dr Radford Pax is not in the office this week. I have emailed all to our TTL so she can print a Passenger transport manager for # [phone] with 3 refills, have Dr Quentin Ore sign it and the application, date it, and to fax all to [employer] pt asst at the fax number written on the cover letter included or to place in the to be faxed basket in Medical Records to be faxed.

## 2021-01-22 MED ORDER — ENTRESTO 24-26 MG PO TABS: 1.0000 | ORAL_TABLET | Freq: Two times a day (BID) | ORAL | 3 refills | Status: DC

## 2021-01-22 NOTE — Telephone Encounter (Signed)
Faxed, confirmation received 

## 2021-01-26 ENCOUNTER — Other Ambulatory Visit: Payer: Self-pay | Admitting: Urology

## 2021-01-26 ENCOUNTER — Telehealth: Payer: Self-pay | Admitting: Cardiology

## 2021-01-26 NOTE — Telephone Encounter (Signed)
Left message for the patient to call back and speak to the on call preop APP 

## 2021-01-26 NOTE — Telephone Encounter (Signed)
° °  Pre-operative Risk Assessment    Patient Name: [name]  DOB: 10/04/1955 MRN: [id number]      Request for Surgical Clearance{ 1. What type of surgery is being performed? Enter name of procedure below and number of teeth if dental extraction.  :    Procedure: Cystoscopy  Date of Surgery:  Clearance 02/17/21                                 Surgeon:  Dr. Link Snuffer Surgeon's Group or Practice Name:  [facility] Phone number:  [phone] Ext. [phone] Fax number:  [phone]   Type of Clearance Requested:   - Medical  - Pharmacy:  Hold Aspirin for 5 days   Type of Anesthesia:  General    Additional requests/questions:   no   Romilda Garret   01/26/2021, 1:27 PM

## 2021-01-27 ENCOUNTER — Ambulatory Visit (INDEPENDENT_AMBULATORY_CARE_PROVIDER_SITE_OTHER): Payer: Medicare Other | Admitting: Physician Assistant

## 2021-01-27 ENCOUNTER — Encounter: Payer: Self-pay | Admitting: Physician Assistant

## 2021-01-27 ENCOUNTER — Other Ambulatory Visit: Payer: Self-pay

## 2021-01-27 VITALS — BP 114/61 | HR 75 | Temp 98.2°F | Ht 64.5 in | Wt 236.2 lb

## 2021-01-27 DIAGNOSIS — E782 Mixed hyperlipidemia: Secondary | ICD-10-CM | POA: Diagnosis not present

## 2021-01-27 DIAGNOSIS — M170 Bilateral primary osteoarthritis of knee: Secondary | ICD-10-CM

## 2021-01-27 DIAGNOSIS — I5032 Chronic diastolic (congestive) heart failure: Secondary | ICD-10-CM

## 2021-01-27 DIAGNOSIS — R3129 Other microscopic hematuria: Secondary | ICD-10-CM | POA: Diagnosis not present

## 2021-01-27 DIAGNOSIS — E119 Type 2 diabetes mellitus without complications: Secondary | ICD-10-CM | POA: Diagnosis not present

## 2021-01-27 DIAGNOSIS — B372 Candidiasis of skin and nail: Secondary | ICD-10-CM | POA: Diagnosis not present

## 2021-01-27 MED ORDER — CLOTRIMAZOLE 1 % EX CREA: 1.0000 "application " | TOPICAL_CREAM | Freq: Two times a day (BID) | CUTANEOUS | 0 refills | Status: DC

## 2021-01-27 MED ORDER — GABAPENTIN 100 MG PO CAPS: 100.0000 mg | ORAL_CAPSULE | Freq: Three times a day (TID) | ORAL | 1 refills | Status: DC | PRN

## 2021-01-27 NOTE — Telephone Encounter (Signed)
Left a message for the patient to call back and speak to the on-call preop APP of the day 

## 2021-01-27 NOTE — Progress Notes (Signed)
Subjective:    Patient ID: [name], female    DOB: [birth year], 65 y.o.   MRN: [id number]  No chief complaint on file.   HPI 64 y.o. patient presents today for new patient establishment with me.  Patient was previously established with Dr. Rogers Blocker. She is here with her lifelong friend.  Current Care Team: Dr. Radford Pax, cardiology Dr. Dwyane Dee, endocrinology  Dr. Gloriann Loan, Urology  Acute Concerns: Jan 4th - procedure coming up with urology for hematuria  She is needing a few medications refilled today  Chronic Concerns: See PMH listed below, as well as A/P for details on issues we specifically discussed during today's visit.      Past Medical History:  Diagnosis Date   Anxiety    Aortic stenosis, mild    echo 2017   Breast cancer ([place]) 1996   left breast   Carpal tunnel syndrome    Cataract    Chronic diastolic CHF (congestive heart failure) (HCC)    NYHA class II   Diabetes mellitus without complication (HCC)    Hyperlipidemia    Hypertension    Left bundle branch block (LBBB)    Low back pain    Neuropathy    NICM (nonischemic cardiomyopathy) ([place])    EF 30%, cath 05/20/2015 clean coronary.  EF resolved by echo with EF 55%   OSA on CPAP    Prediabetes    Pulmonary HTN ([place]) 05/2015   Severe with PASP 67/70mmHg - resolved on followup echo   PVC's (premature ventricular contractions) 11/04/2015   Sleep apnea    cpap    Past Surgical History:  Procedure Laterality Date   BREAST SURGERY     CARDIAC CATHETERIZATION N/A 05/20/2015   Procedure: Right/Left Heart Cath and Coronary Angiography;  Surgeon: Troy Sine, MD;  Location: [facility];  Service: Cardiovascular;  Laterality: N/A;   CATARACT EXTRACTION W/ INTRAOCULAR LENS  IMPLANT, BILATERAL     COLONOSCOPY  2018   DILATION AND CURETTAGE OF UTERUS     lumpectomy left breast     POLYPECTOMY     UTERINE FIBROID SURGERY      Family History  Problem Relation Age of Onset   Arthritis Mother     Depression Mother    Hearing loss Mother    Hyperlipidemia Mother    Miscarriages / [place] Mother    Colon cancer Mother 80       2010   Colon polyps Mother    Alcohol abuse Father    Cancer Father        PROSTATE   Depression Father    Hypertension Father    Hyperlipidemia Father    Drug abuse Brother    Early death Maternal Grandmother    Cancer Paternal Grandmother    Heart attack Neg Hx    Diabetes Neg Hx    Esophageal cancer Neg Hx    Rectal cancer Neg Hx    Stomach cancer Neg Hx     Social History   Tobacco Use   Smoking status: Former    Types: Cigarettes    Quit date: 06/18/2009    Years since quitting: 11.6   Smokeless tobacco: Never  Vaping Use   Vaping Use: Never used  Substance Use Topics   Alcohol use: Yes    Comment: once every 2 months   Drug use: No     Allergies  Allergen Reactions   Jardiance [Empagliflozin] Anaphylaxis, Itching, Rash and Other (See Comments)  Yeast infection   Atorvastatin Other (See Comments)    REACTION: mylagias   Pravastatin Sodium Other (See Comments)    REACTION: myalgias   Sulfa Antibiotics Hives    Review of Systems NEGATIVE UNLESS OTHERWISE INDICATED IN HPI      Objective:     BP 114/61    Pulse 75    Temp 98.2 F (36.8 C)    Ht 5' 4.5" (1.638 m)    Wt 236 lb 3.2 oz (107.1 kg)    SpO2 96%    BMI 39.92 kg/m   Wt Readings from Last 3 Encounters:  01/27/21 236 lb 3.2 oz (107.1 kg)  12/02/20 237 lb 3.2 oz (107.6 kg)  09/28/20 239 lb (108.4 kg)    BP Readings from Last 3 Encounters:  01/27/21 114/61  12/02/20 122/80  07/14/20 108/64     Physical Exam Vitals reviewed.  Constitutional:      Appearance: Normal appearance. She is well-developed. She is obese.  HENT:     Head: Normocephalic and atraumatic.     Right Ear: Tympanic membrane, ear canal and external ear normal.     Left Ear: Tympanic membrane, ear canal and external ear normal.  Eyes:     Conjunctiva/sclera: Conjunctivae normal.      Pupils: Pupils are equal, round, and reactive to light.  Neck:     Thyroid: No thyromegaly.     Vascular: No carotid bruit.  Cardiovascular:     Rate and Rhythm: Normal rate and regular rhythm.     Heart sounds: Normal heart sounds. No murmur heard. Pulmonary:     Effort: Pulmonary effort is normal.     Breath sounds: Normal breath sounds.  Abdominal:     General: Bowel sounds are normal. There is no distension.     Palpations: Abdomen is soft.     Tenderness: There is no abdominal tenderness.  Musculoskeletal:     Cervical back: Normal range of motion and neck supple.     Right lower leg: Edema present.     Left lower leg: Edema present.     Comments: Non-pitting edema BLE  Lymphadenopathy:     Cervical: No cervical adenopathy.  Skin:    General: Skin is warm and dry.     Capillary Refill: Capillary refill takes less than 2 seconds.     Findings: No rash.  Neurological:     General: No focal deficit present.     Mental Status: She is alert and oriented to person, place, and time.     Cranial Nerves: No cranial nerve deficit.     Coordination: Coordination normal.     Deep Tendon Reflexes: Reflexes normal.  Psychiatric:        Behavior: Behavior normal.       Assessment & Plan:   Problem List Items Addressed This Visit       Cardiovascular and Mediastinum   Chronic diastolic heart failure (HCC)     Endocrine   Type 2 diabetes mellitus without complications ([place]) - Primary     Other   Mixed hyperlipidemia (Chronic)   Morbid obesity (HCC) (Chronic)   Other Visit Diagnoses     Primary osteoarthritis of both knees       Candidiasis, intertrigo       Relevant Medications   clotrimazole (LOTRIMIN) 1 % cream   Microscopic hematuria            Meds ordered this encounter  Medications   clotrimazole (LOTRIMIN) 1 %  cream    Sig: Apply 1 application topically 2 (two) times daily.    Dispense:  60 g    Refill:  0   gabapentin (NEURONTIN) 100 MG capsule     Sig: Take 1 capsule (100 mg total) by mouth 3 (three) times daily as needed.    Dispense:  90 capsule    Refill:  1   1. Type 2 diabetes mellitus without complication, without long-term current use of insulin (HCC) -She is followed by Dr. Dwyane Dee Lab Results  Component Value Date   HGBA1C 6.2 (A) 12/02/2020    2. Chronic diastolic heart failure ([place]) -She is followed by Dr. Radford Pax  3. 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  She is followed by Dr. Durward Fortes. -She takes gabapentin 100 mg 1 to 3 capsules daily.  Says that she usually only takes it at nighttime because it makes her drowsy.  Refilled this medication for her today.  She also takes Tylenol as needed.  4. Candidiasis, intertrigo -Recurrent intertrigo underneath both breasts.  Looks well controlled today without any evidence of erythema.  Refilled clotrimazole cream for her.  5. Microscopic hematuria -She is following up with Dr. Gloriann Loan  6. Mixed hyperlipidemia Lab Results  Component Value Date   CHOL 149 06/29/2020   HDL 61.70 06/29/2020   LDLCALC 74 06/29/2020   TRIG 70.0 06/29/2020   CHOLHDL 2 06/29/2020   -Doing well with Crestor 5 mg and Zetia 10 mg daily  7. Morbid obesity ([place]) -Encouraged continued work on nutrition as her exercise is limited given her arthritis in her knee situation   This note was prepared with assistance of Systems analyst. Occasional wrong-word or sound-a-like substitutions may have occurred due to the inherent limitations of voice recognition software.  Time Spent: 32 minutes of total time was spent on the date of the encounter performing the following actions: chart review prior to seeing the patient, obtaining history, performing a medically necessary exam, counseling on the treatment plan, placing orders, and documenting in our EHR.    Sindia Kowalczyk M Gradie Butrick,  PA-C

## 2021-01-28 NOTE — Telephone Encounter (Signed)
Pt is f/u on Entresto application process.

## 2021-01-28 NOTE — Telephone Encounter (Signed)
Spoke with pt and made her aware that I was unable to locate any documentation that we had heard back from the assistance program.  Advised I tried to call them and kept getting caught up in them trying to sell me a life alert system  Advised I will send message to Jeani Hawking and have her check on it when she gets back into the office.  Pt appreciative for call.

## 2021-01-31 ENCOUNTER — Other Ambulatory Visit: Payer: Self-pay | Admitting: Endocrinology

## 2021-02-01 NOTE — Telephone Encounter (Signed)
Patient was returning call. Please advise ?

## 2021-02-01 NOTE — Telephone Encounter (Signed)
**Note De-Identified Jorma Tassinari Obfuscation** I called [employer] pt asst foundation and was advised by Regini that the pt was approved for Entresto asst on 01/28/2021 and that her approval is valid until 02/13/2022. Pt ID: [id number]  I called the pt to update but got no answer so I left a message on her VM advising her that she has been approved for asst with her Entresto through [employer] pt asst. I left their phone number incase she needs to reach them and I left my name and office phone number so she can call me back if she has any questions.

## 2021-02-01 NOTE — Telephone Encounter (Signed)
Primary Cardiologist:Traci Turner, MD  Chart reviewed as part of pre-operative protocol coverage. Because of [place] past medical history and time since last visit, he/she will require a follow-up visit in order to better assess preoperative cardiovascular risk.  Pre-op covering staff: - Please schedule appointment and call patient to inform them. - Please contact requesting surgeon's office via preferred method (i.e, phone, fax) to inform them of need for appointment prior to surgery.  If applicable, this message will also be routed to pharmacy pool and/or primary cardiologist for input on holding anticoagulant/antiplatelet agent as requested below so that this information is available at time of patient's appointment.   Lenna Sciara, NP  02/01/2021, 4:05 PM

## 2021-02-01 NOTE — Telephone Encounter (Signed)
PROCEDURE: CYSTOSCOPY WITH RETROGRADE PYELOGRAM POSSIBLE TRANSURETHRAL RESECTION,URETEROSCOPY AND STENT

## 2021-02-02 NOTE — Telephone Encounter (Signed)
Tried to reach the pt to inform that she will need an appt for pre op clearance. I was going to offer an appt that Almyra Deforest, [facility] has at this time at 11 am today. Not sure if the appt will still be available if the pt calls back.

## 2021-02-02 NOTE — Telephone Encounter (Signed)
Spoke with patient and made her aware that by it being the end of the year our appointment slots are limited. I advised the patient that she soul cancel her surgery until she can get clearance. She states her new schedule will come out on February 17, 2021 and she can schedule her pre-op appointment at that time. She states she does not know if her schedule will change or stay the same. She states she prefers to make an appointment with our office first then contact the surgeons office for an appointment since we have to give her clearance.   I advised her that this is the best option. She voiced understanding.   I left a message with the surgeon office making them aware that the patient wants to cancel her procedure and will reschedule once she has her new work schedule. Advised a call back with any questions or concerns.

## 2021-02-02 NOTE — Telephone Encounter (Signed)
Pt returning call to Pre Op Pt is only available 02/03/21, pt works in [place]

## 2021-02-11 ENCOUNTER — Ambulatory Visit: Payer: Medicare Other | Admitting: Orthopaedic Surgery

## 2021-02-12 ENCOUNTER — Telehealth: Payer: Self-pay | Admitting: Cardiology

## 2021-02-12 NOTE — Telephone Encounter (Signed)
LVM to schedule surgical clearance appt with Dr. Debara Pickett (02/16/21) or Dr. Phineas Inches (02/17/21).

## 2021-02-17 ENCOUNTER — Ambulatory Visit: Admit: 2021-02-17 | Payer: Medicare Other | Admitting: Urology

## 2021-02-17 SURGERY — CYSTOSCOPY, WITH RETROGRADE PYELOGRAM
Anesthesia: Choice | Laterality: Bilateral

## 2021-02-23 DIAGNOSIS — E119 Type 2 diabetes mellitus without complications: Secondary | ICD-10-CM | POA: Insufficient documentation

## 2021-02-23 DIAGNOSIS — I519 Heart disease, unspecified: Secondary | ICD-10-CM | POA: Insufficient documentation

## 2021-02-23 DIAGNOSIS — I10 Essential (primary) hypertension: Secondary | ICD-10-CM | POA: Insufficient documentation

## 2021-02-23 DIAGNOSIS — C50919 Malignant neoplasm of unspecified site of unspecified female breast: Secondary | ICD-10-CM | POA: Insufficient documentation

## 2021-03-07 ENCOUNTER — Other Ambulatory Visit: Payer: Self-pay | Admitting: Endocrinology

## 2021-03-29 DIAGNOSIS — G4733 Obstructive sleep apnea (adult) (pediatric): Secondary | ICD-10-CM | POA: Diagnosis not present

## 2021-04-06 ENCOUNTER — Ambulatory Visit: Payer: Medicare Other | Admitting: Endocrinology

## 2021-04-07 ENCOUNTER — Telehealth: Payer: Self-pay | Admitting: Cardiology

## 2021-04-07 NOTE — Telephone Encounter (Signed)
Patient calling the office for samples of medication:   1.  What medication and dosage are you requesting samples for? sacubitril-valsartan (ENTRESTO) 24-26 MG  2.  Are you currently out of this medication? Will be out before she gets her prescription through the mails.  Needs 8 pills.

## 2021-04-08 NOTE — Telephone Encounter (Signed)
**Note De-Identified [name] Obfuscation** The pt called me back and stated that her Entresto shipment will not arrive from [employer] pt asst [employer] until Monday 2/27 and that she will run out before then. I advised her that we are leaving her 2 weeks of Entresto 24-26 mg samples samples in the front office for her to pick up.  She thanked me for our assistance.

## 2021-04-08 NOTE — Telephone Encounter (Signed)
**Note De-Identified Rahil Passey Obfuscation** No answer so I left a message on the pts VM asking her to call Jeani Hawking back at [facility] at [phone] concerning her Delene Loll.

## 2021-04-08 NOTE — Telephone Encounter (Signed)
Hey Lynn, LPN, can you please advise on this matter? Thanks  ?

## 2021-04-20 ENCOUNTER — Ambulatory Visit (INDEPENDENT_AMBULATORY_CARE_PROVIDER_SITE_OTHER): Payer: Medicare Other | Admitting: Endocrinology

## 2021-04-20 ENCOUNTER — Other Ambulatory Visit: Payer: Self-pay

## 2021-04-20 DIAGNOSIS — E782 Mixed hyperlipidemia: Secondary | ICD-10-CM

## 2021-04-20 DIAGNOSIS — E1169 Type 2 diabetes mellitus with other specified complication: Secondary | ICD-10-CM

## 2021-04-20 LAB — MICROALBUMIN / CREATININE URINE RATIO
Creatinine,U: 224.9 mg/dL
Microalb Creat Ratio: 0.7 mg/g (ref 0.0–30.0)
Microalb, Ur: 1.6 mg/dL (ref 0.0–1.9)

## 2021-04-20 LAB — COMPREHENSIVE METABOLIC PANEL
ALT: 14 U/L (ref 0–35)
AST: 13 U/L (ref 0–37)
Albumin: 4.3 g/dL (ref 3.5–5.2)
Alkaline Phosphatase: 54 U/L (ref 39–117)
BUN: 13 mg/dL (ref 6–23)
CO2: 28 mEq/L (ref 19–32)
Calcium: 9.8 mg/dL (ref 8.4–10.5)
Chloride: 101 mEq/L (ref 96–112)
Creatinine, Ser: 0.69 mg/dL (ref 0.40–1.20)
GFR: 90.49 mL/min (ref >60.00)
Glucose, Bld: 143 mg/dL — ABNORMAL HIGH (ref 70–99)
Potassium: 3.8 mEq/L (ref 3.5–5.1)
Sodium: 138 mEq/L (ref 135–145)
Total Bilirubin: 0.4 mg/dL (ref 0.2–1.2)
Total Protein: 6.7 g/dL (ref 6.0–8.3)

## 2021-04-20 LAB — LIPID PANEL
Cholesterol: 154 mg/dL (ref 0–200)
HDL: 61.4 mg/dL (ref >39.00)
LDL Cholesterol: 62 mg/dL (ref 0–99)
NonHDL: 92.53
Total CHOL/HDL Ratio: 3
Triglycerides: 155 mg/dL — ABNORMAL HIGH (ref 0.0–149.0)
VLDL: 31 mg/dL (ref 0.0–40.0)

## 2021-04-20 LAB — POCT GLYCOSYLATED HEMOGLOBIN (HGB A1C): Hemoglobin A1C: 6.1 % — AB (ref 4.0–5.6)

## 2021-04-20 MED ORDER — ONETOUCH VERIO VI STRP: ORAL_STRIP | 0 refills | Status: DC

## 2021-04-20 NOTE — Progress Notes (Signed)
Patient ID: [name], female   DOB: 1955-07-28, 66 y.o.   MRN: [id number]           Reason for Appointment: Follow-up for Type 2 Diabetes   History of Present Illness:          Date of diagnosis of type 2 diabetes mellitus: 09/2017       Background history:   Her diabetes was diagnosed with an A1c of 6.8 Review of her records indicate that probably that her highest fasting blood sugar has been only 130 outside the hospital settings  Recent history:   Most recent A1c is 6.1 and about the same consistently Previous range 6.4-7.1 overall  Fructosamine is last 219  Non-insulin hypoglycemic drugs the patient is taking are: Metformin ER 500 mg 3 tablets daily  Current management, blood sugar patterns and problems identified:  She is fairly consistent with taking all 3 metformin tablets daily as directed However some days she will take 1 in the morning and 2 in the evening and on other days all 3 together depending on her work and meal schedule She has only occasional loose stools and is not having any difficulty with excessive diarrhea As before she is very limited in her ability to exercise because of her knee pain and shoulder pain Currently not trying to do upper body exercises as recommended Again she is trying to avoid fast food and try to eat relatively healthy meals  Her blood sugars at home are excellent at different times although mostly checking in the morning hours, HIGHEST blood sugar 139 and AVERAGE blood sugar at any given time between 97 and 113 She does not appear to be losing any weight now  Last consultation with dietitian: 10/21       Side effects from medications have been: Vaginitis from Jardiance               Exercise:  minimal  Glucose monitoring:  Using One Touch Verio, last 30-day data:   PRE-MEAL Fasting Lunch Dinner Bedtime Overall  Glucose range: 88-139      Mean/median: 111    108   POST-MEAL PC Breakfast PC Lunch PC Dinner  Glucose  range:   102-123  Mean/median:   113   Previously:  PRE-MEAL Mornings Lunch Dinner 9 PM-5 AM Overall  Glucose range:     71-134  Mean/median: 102   110 105    Weight history: Highest 262   Wt Readings from Last 3 Encounters:  04/20/21 236 lb 3.2 oz (107.1 kg)  01/27/21 236 lb 3.2 oz (107.1 kg)  12/02/20 237 lb 3.2 oz (107.6 kg)    Glycemic control:   Lab Results  Component Value Date   HGBA1C 6.1 (A) 04/20/2021   HGBA1C 6.2 (A) 12/02/2020   HGBA1C 6.0 (A) 06/29/2020   Lab Results  Component Value Date   MICROALBUR 1.6 04/20/2021   LDLCALC 62 04/20/2021   CREATININE 0.69 04/20/2021   Lab Results  Component Value Date   MICRALBCREAT 0.7 04/20/2021    Lab Results  Component Value Date   FRUCTOSAMINE 219 12/23/2019      Allergies as of 04/20/2021       Reactions   Jardiance [empagliflozin] Anaphylaxis, Itching, Rash, Other (See Comments)   Yeast infection   Atorvastatin Other (See Comments)   REACTION: mylagias   Pravastatin Sodium Other (See Comments)   REACTION: myalgias   Sulfa Antibiotics Hives        Medication List  Accurate as of April 20, 2021  4:01 PM. If you have any questions, ask your nurse or doctor.          acetaminophen 650 MG CR tablet Commonly known as: TYLENOL Take 1,300 mg by mouth as needed for pain.   aspirin 81 MG tablet Take 81 mg by mouth daily.   BENADRYL ALLERGY PO Take by mouth.   carvedilol 6.25 MG tablet Commonly known as: COREG TAKE 1 TABLET BY MOUTH TWICE DAILY WITH A MEAL.   cetirizine 10 MG tablet Commonly known as: ZYRTEC Take 1 tablet (10 mg total) by mouth daily.   clotrimazole 1 % cream Commonly known as: LOTRIMIN Apply 1 application topically 2 (two) times daily.   diclofenac Sodium 1 % Gel Commonly known as: VOLTAREN Apply topically 4 (four) times daily. Arthritis   Entresto 24-26 MG Generic drug: sacubitril-valsartan Take 1 tablet by mouth 2 (two) times daily.   ezetimibe 10 MG  tablet Commonly known as: ZETIA Take 1 tablet (10 mg total) by mouth daily.   fluticasone 50 MCG/ACT nasal spray Commonly known as: FLONASE Place 2 sprays into both nostrils daily.   furosemide 40 MG tablet Commonly known as: LASIX TAKE ONE TO TWO TABLETS BY MOUTH DAILY AS DIRECTED   gabapentin 100 MG capsule Commonly known as: NEURONTIN Take 1 capsule (100 mg total) by mouth 3 (three) times daily as needed.   metFORMIN 500 MG 24 hr tablet Commonly known as: GLUCOPHAGE-XR TAKE 3 TABLETS BY MOUTH ONCE DAILY WITH SUPPER   multivitamin tablet Take 1 tablet by mouth daily.   OneTouch Delica Lancets 16X Misc Use OneTouch Delica lancets to check blood sugar 3 times weekly, alternating between fasting and 2 hours after meals.   OneTouch Verio Flex System w/Device Kit Use onetouch verio flex meter to check blood sugar 3 times weekly, alternating between fasting and 2 hours after meals.   OneTouch Verio test strip Generic drug: glucose blood USE 1 STRIP TO CHECK GLUCOSE THREE TIMES  A WEEK ALTERNATING BETWEN FASTING AND 2 HOURS AFTER MEALS   rosuvastatin 5 MG tablet Commonly known as: CRESTOR Take 1 tablet (5 mg total) by mouth daily.   spironolactone 25 MG tablet Commonly known as: ALDACTONE Take 1/2 (one-half) tablet by mouth once daily   vitamin C 500 MG tablet Commonly known as: ASCORBIC ACID Take 2,000 mg by mouth as needed.        Allergies:  Allergies  Allergen Reactions   Jardiance [Empagliflozin] Anaphylaxis, Itching, Rash and Other (See Comments)    Yeast infection   Atorvastatin Other (See Comments)    REACTION: mylagias   Pravastatin Sodium Other (See Comments)    REACTION: myalgias   Sulfa Antibiotics Hives    Past Medical History:  Diagnosis Date   Anxiety    Aortic stenosis, mild    echo 2017   Breast cancer ([place]) 1996   left breast   Carpal tunnel syndrome    Cataract    Chronic diastolic CHF (congestive heart failure) (HCC)    NYHA class  II   Diabetes mellitus without complication (HCC)    Hyperlipidemia    Hypertension    Left bundle branch block (LBBB)    Low back pain    Neuropathy    NICM (nonischemic cardiomyopathy) ([place])    EF 30%, cath 05/20/2015 clean coronary.  EF resolved by echo with EF 55%   OSA on CPAP    Prediabetes    Pulmonary HTN ([place]) 05/2015  Severe with PASP 67/30mmHg - resolved on followup echo   PVC's (premature ventricular contractions) 11/04/2015   Sleep apnea    cpap    Past Surgical History:  Procedure Laterality Date   BREAST SURGERY     CARDIAC CATHETERIZATION N/A 05/20/2015   Procedure: Right/Left Heart Cath and Coronary Angiography;  Surgeon: Troy Sine, MD;  Location: [place] [facility];  Service: Cardiovascular;  Laterality: N/A;   CATARACT EXTRACTION W/ INTRAOCULAR LENS  IMPLANT, BILATERAL     COLONOSCOPY  2018   DILATION AND CURETTAGE OF UTERUS     lumpectomy left breast     POLYPECTOMY     UTERINE FIBROID SURGERY      Family History  Problem Relation Age of Onset   Arthritis Mother    Depression Mother    Hearing loss Mother    Hyperlipidemia Mother    Miscarriages / [place] Mother    Colon cancer Mother 80       2010   Colon polyps Mother    Alcohol abuse Father    Cancer Father        PROSTATE   Depression Father    Hypertension Father    Hyperlipidemia Father    Drug abuse Brother    Early death Maternal Grandmother    Cancer Paternal Grandmother    Heart attack Neg Hx    Diabetes Neg Hx    Esophageal cancer Neg Hx    Rectal cancer Neg Hx    Stomach cancer Neg Hx     Social History:  reports that she quit smoking about 11 years ago. Her smoking use included cigarettes. She has never used smokeless tobacco. She reports current alcohol use. She reports that she does not use drugs.   Review of Systems    Lipid history: She has been treated with Crestor and Zetia by her PCP with the following results    Lab Results  Component Value Date   CHOL  154 04/20/2021   HDL 61.40 04/20/2021   LDLCALC 62 04/20/2021   TRIG 155.0 (H) 04/20/2021   CHOLHDL 3 04/20/2021        CARDIAC: Last ejection fraction on echo 55%     Hypertension: Has been treated with low-dose Coreg and spironolactone 25 mg as well as Entresto Blood pressure history:  BP Readings from Last 3 Encounters:  04/20/21 110/70  01/27/21 114/61  12/02/20 122/80   Lab Results  Component Value Date   K 3.8 04/20/2021     Most recent foot exam: 10/22 No symptoms of numbness but occasionally may have some tingling in her lower legs  She had COVID infection diagnosed on 02/17/2020  LABS:  Office Visit on 04/20/2021  Component Date Value Ref Range Status   Hemoglobin A1C 04/20/2021 6.1 (A)  4.0 - 5.6 % Final   Microalb, Ur 04/20/2021 1.6  0.0 - 1.9 mg/dL Final   Creatinine,U 04/20/2021 224.9  mg/dL Final   Microalb Creat Ratio 04/20/2021 0.7  0.0 - 30.0 mg/g Final   Cholesterol 04/20/2021 154  0 - 200 mg/dL Final   ATP III Classification       Desirable:  < 200 mg/dL               Borderline High:  200 - 239 mg/dL          High:  > = 240 mg/dL   Triglycerides 04/20/2021 155.0 (H)  0.0 - 149.0 mg/dL Final   Normal:  <150 mg/dLBorderline High:  150 - 199 mg/dL   HDL 04/20/2021 61.40  >39.00 mg/dL Final   VLDL 04/20/2021 31.0  0.0 - 40.0 mg/dL Final   LDL Cholesterol 04/20/2021 62  0 - 99 mg/dL Final   Total CHOL/HDL Ratio 04/20/2021 3   Final                  Men          Women1/2 Average Risk     3.4          3.3Average Risk          5.0          4.42X Average Risk          9.6          7.13X Average Risk          15.0          11.0                       NonHDL 04/20/2021 92.53   Final   NOTE:  Non-HDL goal should be 30 mg/dL higher than patient's LDL goal (i.e. LDL goal of < 70 mg/dL, would have non-HDL goal of < 100 mg/dL)   Sodium 04/20/2021 138  135 - 145 mEq/L Final   Potassium 04/20/2021 3.8  3.5 - 5.1 mEq/L Final   Chloride 04/20/2021 101  96 - 112 mEq/L  Final   CO2 04/20/2021 28  19 - 32 mEq/L Final   Glucose, Bld 04/20/2021 143 (H)  70 - 99 mg/dL Final   BUN 04/20/2021 13  6 - 23 mg/dL Final   Creatinine, Ser 04/20/2021 0.69  0.40 - 1.20 mg/dL Final   Total Bilirubin 04/20/2021 0.4  0.2 - 1.2 mg/dL Final   Alkaline Phosphatase 04/20/2021 54  39 - 117 U/L Final   AST 04/20/2021 13  0 - 37 U/L Final   ALT 04/20/2021 14  0 - 35 U/L Final   Total Protein 04/20/2021 6.7  6.0 - 8.3 g/dL Final   Albumin 04/20/2021 4.3  3.5 - 5.2 g/dL Final   GFR 04/20/2021 90.49  >60.00 mL/min Final   Calculated using the CKD-EPI Creatinine Equation (2021)   Calcium 04/20/2021 9.8  8.4 - 10.5 mg/dL Final    Physical Examination:  BP 110/70    Pulse 76    Ht _0  (1.626 m)    Wt 236 lb 3.2 oz (107.1 kg)    SpO2 97%    BMI 40.54 kg/m   No ankle edema present    ASSESSMENT:  Diabetes type 2 with BMI around 40  See history of present illness for detailed discussion of current diabetes assessment and problems identified  A1c is stable at 6.1  Current treatment is metformin ER 1500 mg daily  She has had no progression of her diabetes Generally trying to watch her diet but unable to lose weight and able to exercise Blood sugars are excellent at home and about the same fasting and evening readings at various times  Eye exams: She will schedule soon  History of hypercholesterolemia: will recheck labs today  PLAN:    She will continue on 3 metformin tablets daily and take them consistently Again continue to check blood sugars alternating fasting and after meals Discussed fasting and postmeal targets  Follow-up in 6 months unless she has any issues with her blood sugar   There are no Patient Instructions on file for this visit.  Elayne Snare 04/20/2021, 4:01 PM   Note: This office note was prepared with Dragon voice recognition system technology. Any transcriptional errors that result from this process are unintentional.  Addendum: Urine  microalbumin, lipids normal, glucose 143

## 2021-04-22 ENCOUNTER — Ambulatory Visit: Payer: Medicare Other | Admitting: Endocrinology

## 2021-06-04 ENCOUNTER — Other Ambulatory Visit: Payer: Self-pay | Admitting: Endocrinology

## 2021-06-15 DIAGNOSIS — H524 Presbyopia: Secondary | ICD-10-CM | POA: Diagnosis not present

## 2021-06-15 DIAGNOSIS — H5212 Myopia, left eye: Secondary | ICD-10-CM | POA: Diagnosis not present

## 2021-06-15 DIAGNOSIS — H43393 Other vitreous opacities, bilateral: Secondary | ICD-10-CM | POA: Diagnosis not present

## 2021-06-15 DIAGNOSIS — H43813 Vitreous degeneration, bilateral: Secondary | ICD-10-CM | POA: Diagnosis not present

## 2021-06-15 DIAGNOSIS — Z7984 Long term (current) use of oral hypoglycemic drugs: Secondary | ICD-10-CM | POA: Diagnosis not present

## 2021-06-15 DIAGNOSIS — H52203 Unspecified astigmatism, bilateral: Secondary | ICD-10-CM | POA: Diagnosis not present

## 2021-06-15 DIAGNOSIS — E119 Type 2 diabetes mellitus without complications: Secondary | ICD-10-CM | POA: Diagnosis not present

## 2021-06-15 DIAGNOSIS — Z961 Presence of intraocular lens: Secondary | ICD-10-CM | POA: Diagnosis not present

## 2021-06-21 ENCOUNTER — Encounter: Payer: Self-pay | Admitting: Physician Assistant

## 2021-06-21 ENCOUNTER — Ambulatory Visit: Payer: Medicare Other | Admitting: Physician Assistant

## 2021-06-21 ENCOUNTER — Ambulatory Visit (INDEPENDENT_AMBULATORY_CARE_PROVIDER_SITE_OTHER): Payer: Medicare Other

## 2021-06-21 DIAGNOSIS — M25511 Pain in right shoulder: Secondary | ICD-10-CM | POA: Diagnosis not present

## 2021-06-21 MED ORDER — METHYLPREDNISOLONE ACETATE 40 MG/ML IJ SUSP
80.0000 mg | INTRAMUSCULAR | Status: AC | PRN
  Administered 2021-06-21: 80 mg via INTRA_ARTICULAR

## 2021-06-21 MED ORDER — LIDOCAINE HCL 1 % IJ SOLN
2.0000 mL | INTRAMUSCULAR | Status: AC | PRN
  Administered 2021-06-21: 2 mL

## 2021-06-21 MED ORDER — BUPIVACAINE HCL 0.25 % IJ SOLN
2.0000 mL | INTRAMUSCULAR | Status: AC | PRN
  Administered 2021-06-21: 2 mL via INTRA_ARTICULAR

## 2021-06-21 NOTE — Progress Notes (Signed)
? ?Office Visit Note ?  ?Patient: [name]           ?Date of Birth: Jul 22, 1955           ?MRN: [id number] ?Visit Date: 06/21/2021 ?             ?Requested by: [name], Randa Evens, PA-C ?[place],  [place] [address] ?PCP: [name], Randa Evens, PA-C ? ?Chief Complaint  ?Patient presents with  ?? Right Shoulder - Pain  ? ? ? ? ?HPI: ?Patient is a pleasant 66 year old woman who comes in with acute right shoulder pain since last night.  She denies any specific injury but has had problems with the shoulder in the past.  She said the last time this happened Dr. Durward Fortes gave her an injection and she had significant relief.  She has global pain all over the shoulder joint.  Denies any paresthesias.  She is very resistant to range of motion because of the pain ? ?Assessment & Plan: ?Visit Diagnoses:  ?1. Acute pain of right shoulder   ? ? 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  Exam is difficult because she is resistant.  Pain.  I did go forward inject her and then she tolerated me doing passive range of motion.  I instructed her on pendulum exercises.  I would like her to be reevaluated in 3-week to see for improvement with Dr. Durward Fortes.  Certainly if she was improving we could try some physical therapy if she had significant pain and weakness could consider an MRI to evaluate her rotator cuff as well ? ?Follow-Up Instructions: No follow-ups on file.  ? ?Ortho Exam ? ?Patient is alert, oriented, no adenopathy, well-dressed, normal affect, normal respiratory effort. ?Right arm distal pulses intact patient is extremely resistant to any range of motion either passively or actively.  I can come forward slightly to about 20 degrees and then she has me stop because of pain.  She will not internally rotate behind the back. ? ?Imaging: ?XR Shoulder Right ? ?Result Date: 06/21/2021 ?X-rays of her right shoulder reviewed today.  They demonstrate advanced  degenerative the joint changes of the [facility] joint.  Also sclerotic changes and cyst formation both in the humeral head and the glenoid.  Some calcifications consistent with calcific tendinitis.  Shoulder is reduced in the glenoid fossa x-rays have progressed with arthritic changes since previous films done in 2021  ?No images are attached to the encounter. ? ?Labs: ?Lab Results  ?Component Value Date  ? HGBA1C 6.1 (A) 04/20/2021  ? HGBA1C 6.2 (A) 12/02/2020  ? HGBA1C 6.0 (A) 06/29/2020  ? ESRSEDRATE 17 12/17/2018  ? REPTSTATUS 04/08/2012 FINAL 04/08/2012  ? REPTSTATUS 04/10/2012 FINAL 04/08/2012  ? GRAMSTAIN  04/08/2012  ?  FEW WBC PRESENT,BOTH PMN AND MONONUCLEAR ?FEW SQUAMOUS EPITHELIAL CELLS PRESENT ?FEW GRAM NEGATIVE RODS ?FEW GRAM POSITIVE RODS ?FEW GRAM NEGATIVE COCCI  ? CULT  04/08/2012  ?  NORMAL OROPHARYNGEAL FLORA ?Note: MICROSCOPIC FINDINGS SUGGEST THAT THIS SPECIMEN IS NOT REPRESENTATIVE OF LOWER RESPIRATORY SECRETIONS. PLEASE RECOLLECT.  ? ? ? ?Lab Results  ?Component Value Date  ? ALBUMIN 4.3 04/20/2021  ? ALBUMIN 4.0 02/24/2020  ? ALBUMIN 4.4 09/13/2018  ? ? ?Lab Results  ?Component Value Date  ? MG 1.9 01/24/2018  ? MG 1.7 11/07/2016  ? MG 1.8 05/21/2015  ? ?No results found for: VD25OH ? ?No results found for: PREALBUMIN ? ?  Latest Ref Rng &  Units 08/23/2019  ? 10:13 AM 06/04/2019  ? 12:52 PM 04/05/2018  ? 11:56 AM  ?CBC EXTENDED  ?WBC 3.8 - 10.8 Thousand/uL 8.7   7.4   8.8    ?RBC 3.80 - 5.10 Million/uL 4.47   4.06   4.48    ?Hemoglobin 11.7 - 15.5 g/dL 14.3   12.6   13.9    ?HCT 35.0 - 45.0 % 41.2   38.1   40.7    ?Platelets 140 - 400 Thousand/uL 361   328   420    ?NEUT# 1,500 - 7,800 cells/uL 5,168   4,055   5,139    ?Lymph# 850 - 3,900 cells/uL 2,793   2,583   2,834    ? ? ? ?There is no height or weight on file to calculate BMI. ? ?Orders:  ?Orders Placed This Encounter  ?Procedures  ?? XR Shoulder Right  ? ?No orders of the defined types were placed in this encounter. ? ? ? Procedures: ?Large Joint  Inj: R subacromial bursa on 06/21/2021 4:15 PM ?Indications: diagnostic evaluation and pain ?Details: 22 G 1.5 in needle, posterior approach ? ?Arthrogram: No ? ?Medications: 2 mL lidocaine 1 %; 80 mg methylPREDNISolone acetate 40 MG/ML; 2 mL bupivacaine 0.25 % ?Outcome: tolerated well, no immediate complications ?Procedure, treatment alternatives, risks and benefits explained, specific risks discussed. Consent was given by the patient. Immediately prior to procedure a time out was called to verify the correct patient, procedure, equipment, support staff and site/side marked as required. Patient was prepped and draped in the usual sterile fashion.  ? ? ? ?Clinical Data: ?No additional findings. ? ?ROS: ? ?All other systems negative, except as noted in the HPI. ?Review of Systems ? ?Objective: ?Vital Signs: There were no vitals taken for this visit. ? ?Specialty Comments:  ?No specialty comments available. ? ?PMFS History: ?Patient Active Problem List  ? Diagnosis Date Noted  ?? Family history of colon cancer in mother 04/15/2020  ?? Shoulder pain, bilateral 12/11/2019  ?? Type 2 diabetes mellitus without complications ([place]) 90/24/0973  ?? PVC's (premature ventricular contractions) 11/04/2015  ?? Aortic stenosis, mild   ?? Heart palpitations 07/28/2015  ?? Morbid obesity ([place]) 05/23/2015  ?? Hypertensive heart disease   ?? Nonischemic cardiomyopathy ([place])   ?? NSVT (nonsustained ventricular tachycardia) ([place]) 05/21/2015  ?? Chronic diastolic heart failure ([place]) 05/20/2015  ?? OSA (obstructive sleep apnea) 01/02/2014  ?? LBBB (left bundle branch block) 01/02/2014  ?? Mixed hyperlipidemia 04/15/2010  ?? Personal history of malignant neoplasm of breast 04/15/2010  ? ?Past Medical History:  ?Diagnosis Date  ?? Anxiety   ?? Aortic stenosis, mild   ? echo 2017  ?? Breast cancer ([place]) 1996  ? left breast  ?? Carpal tunnel syndrome   ?? Cataract   ?? Chronic diastolic CHF (congestive heart failure) ([place])   ? NYHA class II  ??  Diabetes mellitus without complication ([place])   ?? Hyperlipidemia   ?? Hypertension   ?? Left bundle branch block (LBBB)   ?? Low back pain   ?? Neuropathy   ?? NICM (nonischemic cardiomyopathy) ([place])   ? EF 30%, cath 05/20/2015 clean coronary.  EF resolved by echo with EF 55%  ?? OSA on CPAP   ?? Prediabetes   ?? Pulmonary HTN ([place]) 05/2015  ? Severe with PASP 67/3mHg - resolved on followup echo  ?? PVC's (premature ventricular contractions) 11/04/2015  ?? Sleep apnea   ? cpap  ?  ?Family History  ?Problem Relation  Age of Onset  ?? Arthritis Mother   ?? Depression Mother   ?? Hearing loss Mother   ?? Hyperlipidemia Mother   ?? Miscarriages / [place] Mother   ?? Colon cancer Mother 33  ?     2010  ?? Colon polyps Mother   ?? Alcohol abuse Father   ?? Cancer Father   ?     PROSTATE  ?? Depression Father   ?? Hypertension Father   ?? Hyperlipidemia Father   ?? Drug abuse Brother   ?? Early death Maternal Grandmother   ?? Cancer Paternal Grandmother   ?? Heart attack Neg Hx   ?? Diabetes Neg Hx   ?? Esophageal cancer Neg Hx   ?? Rectal cancer Neg Hx   ?? Stomach cancer Neg Hx   ?  ?Past Surgical History:  ?Procedure Laterality Date  ?? BREAST SURGERY    ?? CARDIAC CATHETERIZATION N/A 05/20/2015  ? Procedure: Right/Left Heart Cath and Coronary Angiography;  Surgeon: Troy Sine, MD;  Location: [place];  Service: Cardiovascular;  Laterality: N/A;  ?? CATARACT EXTRACTION W/ INTRAOCULAR LENS  IMPLANT, BILATERAL    ?? COLONOSCOPY  2018  ?? DILATION AND CURETTAGE OF UTERUS    ?? lumpectomy left breast    ?? POLYPECTOMY    ?? UTERINE FIBROID SURGERY    ? ?Social History  ? ?Occupational History  ?? Occupation: Administrator  ?Tobacco Use  ?? Smoking status: Former  ?  Types: Cigarettes  ?  Quit date: 06/18/2009  ?  Years since quitting: 12.0  ?? Smokeless tobacco: Never  ?Vaping Use  ?? Vaping Use: Never used  ?Substance and Sexual Activity  ?? Alcohol use: Yes  ?  Comment: once every 2 months  ?? Drug use: No  ??  Sexual activity: Yes  ? ? ? ? ? ?

## 2021-06-25 ENCOUNTER — Other Ambulatory Visit: Payer: Self-pay | Admitting: Cardiology

## 2021-06-29 DIAGNOSIS — G4733 Obstructive sleep apnea (adult) (pediatric): Secondary | ICD-10-CM | POA: Diagnosis not present

## 2021-07-13 ENCOUNTER — Encounter: Payer: Self-pay | Admitting: Orthopaedic Surgery

## 2021-07-13 ENCOUNTER — Ambulatory Visit: Payer: Medicare Other | Admitting: Orthopaedic Surgery

## 2021-07-13 DIAGNOSIS — M25511 Pain in right shoulder: Secondary | ICD-10-CM

## 2021-07-13 NOTE — Progress Notes (Signed)
Office Visit Note   Patient: [name]           Date of Birth: 14-May-1955           MRN: [id number] Visit Date: 07/13/2021              Requested by: Peck, Joanna Evens, PA-C [place],  [place] [address] PCP: Joanna Lathe, PA-C   Assessment & Plan: Visit Diagnoses:   Plan: Patient follows up for her right shoulder pain.  She was given an injection 3 weeks ago and she is doing much better.  She is doing a self-directed exercise program.  She just has a very small amount of pain.  X-rays demonstrate subchondral cyst formation and evidence of some calcific tendinitis.  We discussed the natural history of her rotator cuff and her arthritis.  Certainly if she has any more difficulty she can follow-up for another injection in a couple months  Follow-Up Instructions: No follow-ups on file.   Orders:  No orders of the defined types were placed in this encounter.  No orders of the defined types were placed in this encounter.     Procedures: No procedures performed   Clinical Data: No additional findings.   Subjective: Chief Complaint  Patient presents with   Right Shoulder - Follow-up  Patient presents today for a three week follow up on her right shoulder. She received a cortisone injection with [name]. She said that she is doing so much better.     Review of Systems  All other systems reviewed and are negative.   Objective: Vital Signs: There were no vitals taken for this visit.  Physical Exam Constitutional:      Appearance: Normal appearance.  Pulmonary:     Effort: Pulmonary effort is normal.  Skin:    General: Skin is warm and dry.  Neurological:     Mental Status: She is alert.    Ortho Exam Examination of her right shoulder she has almost full forward elevation.  No pain with internal rotation behind her back motion is slightly limited but significantly improved.  Strength is 5 out of 5 Specialty Comments:  No specialty  comments available.  Imaging: No results found.   PMFS History: Patient Active Problem List   Diagnosis Date Noted   Family history of colon cancer in mother 04/15/2020   Shoulder pain, bilateral 12/11/2019   Type 2 diabetes mellitus without complications ([place]) 19/14/7829   PVC's (premature ventricular contractions) 11/04/2015   Aortic stenosis, mild    Heart palpitations 07/28/2015   Morbid obesity ([place]) 05/23/2015   Hypertensive heart disease    Nonischemic cardiomyopathy ([facility])    NSVT (nonsustained ventricular tachycardia) ([place]) 05/21/2015   Chronic diastolic heart failure ([place]) 05/20/2015   OSA (obstructive sleep apnea) 01/02/2014   LBBB (left bundle branch block) 01/02/2014   Mixed hyperlipidemia 04/15/2010   Personal history of malignant neoplasm of breast 04/15/2010   Past Medical History:  Diagnosis Date   Anxiety    Aortic stenosis, mild    echo 2017   Breast cancer ([place]) 1996   left breast   Carpal tunnel syndrome    Cataract    Chronic diastolic CHF (congestive heart failure) ([facility])    NYHA class II   Diabetes mellitus without complication ([facility])    Hyperlipidemia    Hypertension    Left bundle branch block (LBBB)    Low back pain    Neuropathy    NICM (  nonischemic cardiomyopathy) ([place])    EF 30%, cath 05/20/2015 clean coronary.  EF resolved by echo with EF 55%   OSA on CPAP    Prediabetes    Pulmonary HTN ([place]) 05/2015   Severe with PASP 67/19mHg - resolved on followup echo   PVC's (premature ventricular contractions) 11/04/2015   Sleep apnea    cpap    Family History  Problem Relation Age of Onset   Arthritis Mother    Depression Mother    Hearing loss Mother    Hyperlipidemia Mother    Miscarriages / [place]    Colon cancer Mother 80       2010   Colon polyps Mother    Alcohol abuse Father    Cancer Father        PROSTATE   Depression Father    Hypertension Father    Hyperlipidemia Father    Drug abuse Brother    Early death  Maternal Grandmother    Cancer Paternal Grandmother    Heart attack Neg Hx    Diabetes Neg Hx    Esophageal cancer Neg Hx    Rectal cancer Neg Hx    Stomach cancer Neg Hx     Past Surgical History:  Procedure Laterality Date   BREAST SURGERY     CARDIAC CATHETERIZATION N/A 05/20/2015   Procedure: Right/Left Heart Cath and Coronary Angiography;  Surgeon: TTroy Sine MD;  [facility];  Service: Cardiovascular;  Laterality: N/A;   CATARACT EXTRACTION W/ INTRAOCULAR LENS  IMPLANT, BILATERAL     COLONOSCOPY  2018   DILATION AND CURETTAGE OF UTERUS     lumpectomy left breast     POLYPECTOMY     UTERINE FIBROID SURGERY     Social History   Occupational History   Occupation: Truck DGeophysicist/field seismologist Tobacco Use   Smoking status: Former    Types: Cigarettes    Quit date: 06/18/2009    Years since quitting: 12.0   Smokeless tobacco: Never  Vaping Use   Vaping Use: Never used  Substance and Sexual Activity   Alcohol use: Yes    Comment: once every 2 months   Drug use: No   Sexual activity: Yes

## 2021-07-18 ENCOUNTER — Other Ambulatory Visit: Payer: Self-pay | Admitting: Endocrinology

## 2021-07-18 ENCOUNTER — Other Ambulatory Visit: Payer: Self-pay | Admitting: Cardiology

## 2021-07-18 DIAGNOSIS — E1169 Type 2 diabetes mellitus with other specified complication: Secondary | ICD-10-CM

## 2021-07-28 ENCOUNTER — Ambulatory Visit: Payer: Medicare Other | Admitting: Physician Assistant

## 2021-07-28 ENCOUNTER — Other Ambulatory Visit: Payer: Self-pay | Admitting: Cardiology

## 2021-08-05 ENCOUNTER — Encounter: Payer: Self-pay | Admitting: Orthopaedic Surgery

## 2021-08-05 ENCOUNTER — Other Ambulatory Visit: Payer: Self-pay | Admitting: Cardiology

## 2021-08-05 ENCOUNTER — Telehealth: Payer: Self-pay | Admitting: Cardiology

## 2021-08-05 NOTE — Telephone Encounter (Signed)
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

## 2021-08-05 NOTE — Telephone Encounter (Signed)
Advised patient that we can get this ordered and scheduled next week when she is here to see Robbie Lis, PA. Patient verbalized understanding.

## 2021-08-06 ENCOUNTER — Telehealth: Payer: Self-pay | Admitting: Endocrinology

## 2021-08-06 DIAGNOSIS — E1169 Type 2 diabetes mellitus with other specified complication: Secondary | ICD-10-CM

## 2021-08-06 NOTE — Telephone Encounter (Signed)
Patient called to advise that she tests her blood sugar 2-3x per day and that the most recent RX was sent to her pharmacy to test 2-3x per week.  Is requesting updated Rx to be sent to [facility] on [address]

## 2021-08-09 MED ORDER — ONETOUCH VERIO VI STRP: ORAL_STRIP | 2 refills | Status: DC

## 2021-08-10 ENCOUNTER — Encounter: Payer: Self-pay | Admitting: Physician Assistant

## 2021-08-10 ENCOUNTER — Ambulatory Visit: Payer: Medicare Other | Admitting: Physician Assistant

## 2021-08-10 ENCOUNTER — Encounter: Payer: Self-pay | Admitting: Family Medicine

## 2021-08-10 ENCOUNTER — Ambulatory Visit (INDEPENDENT_AMBULATORY_CARE_PROVIDER_SITE_OTHER): Payer: Medicare Other | Admitting: Family Medicine

## 2021-08-10 VITALS — BP 112/78 | HR 76 | Ht 64.0 in | Wt 238.0 lb

## 2021-08-10 VITALS — BP 118/74 | HR 73 | Temp 97.2°F | Ht 64.0 in | Wt 237.2 lb

## 2021-08-10 DIAGNOSIS — I10 Essential (primary) hypertension: Secondary | ICD-10-CM

## 2021-08-10 DIAGNOSIS — G4733 Obstructive sleep apnea (adult) (pediatric): Secondary | ICD-10-CM

## 2021-08-10 DIAGNOSIS — I5042 Chronic combined systolic (congestive) and diastolic (congestive) heart failure: Secondary | ICD-10-CM

## 2021-08-10 DIAGNOSIS — I350 Nonrheumatic aortic (valve) stenosis: Secondary | ICD-10-CM | POA: Diagnosis not present

## 2021-08-10 DIAGNOSIS — Z9989 Dependence on other enabling machines and devices: Secondary | ICD-10-CM

## 2021-08-10 DIAGNOSIS — I5032 Chronic diastolic (congestive) heart failure: Secondary | ICD-10-CM

## 2021-08-10 DIAGNOSIS — E785 Hyperlipidemia, unspecified: Secondary | ICD-10-CM

## 2021-08-10 DIAGNOSIS — I110 Hypertensive heart disease with heart failure: Secondary | ICD-10-CM | POA: Diagnosis not present

## 2021-08-10 DIAGNOSIS — Z23 Encounter for immunization: Secondary | ICD-10-CM | POA: Diagnosis not present

## 2021-08-10 DIAGNOSIS — I428 Other cardiomyopathies: Secondary | ICD-10-CM

## 2021-08-10 DIAGNOSIS — Z780 Asymptomatic menopausal state: Secondary | ICD-10-CM

## 2021-08-10 DIAGNOSIS — B372 Candidiasis of skin and nail: Secondary | ICD-10-CM

## 2021-08-10 DIAGNOSIS — E782 Mixed hyperlipidemia: Secondary | ICD-10-CM | POA: Diagnosis not present

## 2021-08-10 DIAGNOSIS — E119 Type 2 diabetes mellitus without complications: Secondary | ICD-10-CM

## 2021-08-10 MED ORDER — ROSUVASTATIN CALCIUM 5 MG PO TABS: 5.0000 mg | ORAL_TABLET | Freq: Every day | ORAL | 3 refills | Status: DC

## 2021-08-10 MED ORDER — CLOTRIMAZOLE 1 % EX CREA: 1.0000 | TOPICAL_CREAM | Freq: Two times a day (BID) | CUTANEOUS | 0 refills | Status: DC

## 2021-08-10 MED ORDER — EZETIMIBE 10 MG PO TABS: 10.0000 mg | ORAL_TABLET | Freq: Every day | ORAL | 3 refills | Status: DC

## 2021-08-25 ENCOUNTER — Ambulatory Visit (HOSPITAL_COMMUNITY): Payer: Medicare Other | Attending: Internal Medicine

## 2021-08-25 DIAGNOSIS — I350 Nonrheumatic aortic (valve) stenosis: Secondary | ICD-10-CM | POA: Diagnosis not present

## 2021-08-25 DIAGNOSIS — I5042 Chronic combined systolic (congestive) and diastolic (congestive) heart failure: Secondary | ICD-10-CM | POA: Diagnosis not present

## 2021-08-25 DIAGNOSIS — I10 Essential (primary) hypertension: Secondary | ICD-10-CM | POA: Insufficient documentation

## 2021-08-25 DIAGNOSIS — G4733 Obstructive sleep apnea (adult) (pediatric): Secondary | ICD-10-CM | POA: Insufficient documentation

## 2021-08-25 DIAGNOSIS — E785 Hyperlipidemia, unspecified: Secondary | ICD-10-CM | POA: Insufficient documentation

## 2021-08-25 DIAGNOSIS — Z9989 Dependence on other enabling machines and devices: Secondary | ICD-10-CM | POA: Insufficient documentation

## 2021-08-25 LAB — ECHOCARDIOGRAM COMPLETE
AR max vel: 1.28 cm2
AV Area VTI: 1.37 cm2
AV Area mean vel: 1.29 cm2
AV Mean grad: 19 mmHg
AV Peak grad: 30.9 mmHg
Ao pk vel: 2.78 m/s
Area-P 1/2: 5.97 cm2
S' Lateral: 3.8 cm

## 2021-08-30 ENCOUNTER — Other Ambulatory Visit: Payer: Self-pay | Admitting: Cardiology

## 2021-09-06 ENCOUNTER — Other Ambulatory Visit: Payer: Self-pay | Admitting: Cardiology

## 2021-09-08 ENCOUNTER — Telehealth: Payer: Self-pay | Admitting: Physician Assistant

## 2021-09-08 NOTE — Telephone Encounter (Signed)
Copied from [place] [phone]. Topic: Medicare AWV >> Sep 08, 2021 11:33 AM Devoria Glassing wrote: Reason for CRM: Left message for patient to schedule Annual Wellness Visit.  Please schedule with Nurse Health Advisor Charlott Rakes, RN at [facility]. This appt can be telephone or office visit. Please call [phone] ask for [facility]

## 2021-10-14 ENCOUNTER — Other Ambulatory Visit: Payer: Self-pay | Admitting: Family Medicine

## 2021-10-14 ENCOUNTER — Other Ambulatory Visit: Payer: Medicare Other

## 2021-10-14 DIAGNOSIS — Z780 Asymptomatic menopausal state: Secondary | ICD-10-CM

## 2021-10-14 DIAGNOSIS — G4733 Obstructive sleep apnea (adult) (pediatric): Secondary | ICD-10-CM | POA: Diagnosis not present

## 2021-10-21 ENCOUNTER — Encounter: Payer: Self-pay | Admitting: Endocrinology

## 2021-10-21 ENCOUNTER — Ambulatory Visit: Payer: Medicare Other | Admitting: Endocrinology

## 2021-10-21 DIAGNOSIS — E1169 Type 2 diabetes mellitus with other specified complication: Secondary | ICD-10-CM | POA: Diagnosis not present

## 2021-10-21 DIAGNOSIS — E782 Mixed hyperlipidemia: Secondary | ICD-10-CM | POA: Diagnosis not present

## 2021-10-21 LAB — POCT GLYCOSYLATED HEMOGLOBIN (HGB A1C): Hemoglobin A1C: 6.1 % — AB (ref 4.0–5.6)

## 2021-10-21 NOTE — Patient Instructions (Signed)
Check blood sugars on waking up 3/7  Also check blood sugars about 2 hours after meals and do this after different meals by rotation  Recommended blood sugar levels on waking up are 90-130 and about 2 hours after meal is 130-160  Please bring your blood sugar monitor to each visit, thank you

## 2021-10-21 NOTE — Progress Notes (Signed)
Patient ID: [name], female   DOB: January 02, 1956, 66 y.o.   MRN: [id number]           Reason for Appointment: Follow-up for Type 2 Diabetes   History of Present Illness:          Date of diagnosis of type 2 diabetes mellitus: 09/2017       Background history:   Her diabetes was diagnosed with an A1c of 6.8 Review of her records indicate that probably that her highest fasting blood sugar has been only 130 outside the hospital settings  Recent history:   Most recent A1c is 6.1 and about the same consistently Previous range 6.4-7.1 overall  Fructosamine is last 219  Non-insulin hypoglycemic drugs the patient is taking are: Metformin ER 500 mg 3 tablets daily  Current management, blood sugar patterns and problems identified:  She is doing fairly well and trying to stick with her diet although occasionally will have sweets She is quite consistent with trying to check her sugars but mostly in the mornings And again she is limited in her exercise ability but has still lost 4 pounds She tries to take her metformin with one of her meals and occasionally may only get a chance to eat 1 meal a day No side effects from this Blood sugars after meals are excellent with highest reading only 128 late evening  Last consultation with dietitian: 10/21       Side effects from medications have been: Vaginitis from Jardiance               Exercise:  minimal  Glucose monitoring:  Using One Touch Verio, last 30-day data:   PRE-MEAL Fasting Lunch Dinner Bedtime Overall  Glucose range: 79-122    79-128  Mean/median: 104       POST-MEAL PC Breakfast PC Lunch PC Dinner  Glucose range:   104-119  Mean/median:      Previously  PRE-MEAL Fasting Lunch Dinner Bedtime Overall  Glucose range: 88-139      Mean/median: 111    108   POST-MEAL PC Breakfast PC Lunch PC Dinner  Glucose range:   102-123  Mean/median:   113     Weight history: Highest 262   Wt Readings from Last 3 Encounters:   10/21/21 234 lb 6.4 oz (106.3 kg)  08/10/21 238 lb (108 kg)  08/10/21 237 lb 3.2 oz (107.6 kg)    Glycemic control:   Lab Results  Component Value Date   HGBA1C 6.1 (A) 10/21/2021   HGBA1C 6.1 (A) 04/20/2021   HGBA1C 6.2 (A) 12/02/2020   Lab Results  Component Value Date   MICROALBUR 1.6 04/20/2021   LDLCALC 62 04/20/2021   CREATININE 0.69 04/20/2021   Lab Results  Component Value Date   MICRALBCREAT 0.7 04/20/2021    Lab Results  Component Value Date   FRUCTOSAMINE 219 12/23/2019      Allergies as of 10/21/2021       Reactions   Jardiance [empagliflozin] Anaphylaxis, Itching, Rash, Other (See Comments)   Yeast infection   Atorvastatin Other (See Comments)   REACTION: mylagias   Pravastatin Sodium Other (See Comments)   REACTION: myalgias   Sulfa Antibiotics Hives        Medication List        Accurate as of October 21, 2021 10:35 AM. If you have any questions, ask your nurse or doctor.          acetaminophen 650 MG CR tablet Commonly known  as: TYLENOL Take 1,300 mg by mouth as needed for pain.   ascorbic acid 500 MG tablet Commonly known as: VITAMIN C Take 2,000 mg by mouth as needed.   aspirin 81 MG tablet Take 81 mg by mouth daily.   BENADRYL ALLERGY PO Take by mouth.   carvedilol 6.25 MG tablet Commonly known as: COREG TAKE 1 TABLET BY MOUTH TWICE DAILY WITH A MEAL   cetirizine 10 MG tablet Commonly known as: ZYRTEC Take 1 tablet (10 mg total) by mouth daily.   clotrimazole 1 % cream Commonly known as: LOTRIMIN Apply 1 Application topically 2 (two) times daily.   diclofenac Sodium 1 % Gel Commonly known as: VOLTAREN Apply topically 4 (four) times daily. Arthritis   Entresto 24-26 MG Generic drug: sacubitril-valsartan Take 1 tablet by mouth 2 (two) times daily.   ezetimibe 10 MG tablet Commonly known as: ZETIA Take 1 tablet (10 mg total) by mouth daily.   fluticasone 50 MCG/ACT nasal spray Commonly known as:  FLONASE Place 2 sprays into both nostrils daily.   furosemide 40 MG tablet Commonly known as: LASIX TAKE ONE TO TWO TABLETS BY MOUTH DAILY AS DIRECTED   gabapentin 100 MG capsule Commonly known as: NEURONTIN Take 1 capsule (100 mg total) by mouth 3 (three) times daily as needed.   metFORMIN 500 MG 24 hr tablet Commonly known as: GLUCOPHAGE-XR TAKE 3 TABLETS BY MOUTH ONCE DAILY WITH SUPPER   multivitamin tablet Take 1 tablet by mouth daily.   OneTouch Delica Lancets 16X Misc Use OneTouch Delica lancets to check blood sugar 3 times weekly, alternating between fasting and 2 hours after meals.   OneTouch Verio Flex System w/Device Kit Use onetouch verio flex meter to check blood sugar 3 times weekly, alternating between fasting and 2 hours after meals.   OneTouch Verio test strip Generic drug: glucose blood USE 1 STRIP TO CHECK BLOOD SUGAR TWO TO THREE TIMES A DAY   rosuvastatin 5 MG tablet Commonly known as: CRESTOR Take 1 tablet (5 mg total) by mouth daily.   spironolactone 25 MG tablet Commonly known as: ALDACTONE TAKE 1/2 (ONE-HALF) TABLET BY MOUTH ONCE DAILY . APPOINTMENT REQUIRED FOR FUTURE REFILLS        Allergies:  Allergies  Allergen Reactions   Jardiance [Empagliflozin] Anaphylaxis, Itching, Rash and Other (See Comments)    Yeast infection   Atorvastatin Other (See Comments)    REACTION: mylagias   Pravastatin Sodium Other (See Comments)    REACTION: myalgias   Sulfa Antibiotics Hives    Past Medical History:  Diagnosis Date   Anxiety    Aortic stenosis, mild    echo 2017   Breast cancer ([place]) 1996   left breast   Carpal tunnel syndrome    Cataract    Chronic diastolic CHF (congestive heart failure) ([facility])    NYHA class II   Diabetes mellitus without complication ([facility])    Hyperlipidemia    Hypertension    Left bundle branch block (LBBB)    Low back pain    Neuropathy    NICM (nonischemic cardiomyopathy) ([place])    EF 30%, cath 05/20/2015 clean  coronary.  EF resolved by echo with EF 55%   OSA on CPAP    Prediabetes    Pulmonary HTN ([place]) 05/2015   Severe with PASP 67/53mHg - resolved on followup echo   PVC's (premature ventricular contractions) 11/04/2015   Sleep apnea    cpap    Past Surgical History:  Procedure Laterality Date  BREAST SURGERY     CARDIAC CATHETERIZATION N/A 05/20/2015   Procedure: Right/Left Heart Cath and Coronary Angiography;  Surgeon: Troy Sine, MD;  Location: [facility];  Service: Cardiovascular;  Laterality: N/A;   CATARACT EXTRACTION W/ INTRAOCULAR LENS  IMPLANT, BILATERAL     COLONOSCOPY  2018   DILATION AND CURETTAGE OF UTERUS     lumpectomy left breast     POLYPECTOMY     UTERINE FIBROID SURGERY      Family History  Problem Relation Age of Onset   Arthritis Mother    Depression Mother    Hearing loss Mother    Hyperlipidemia Mother    Miscarriages / [place] Mother    Colon cancer Mother 80       2010   Colon polyps Mother    Alcohol abuse Father    Cancer Father        PROSTATE   Depression Father    Hypertension Father    Hyperlipidemia Father    Drug abuse Brother    Early death Maternal Grandmother    Cancer Paternal Grandmother    Heart attack Neg Hx    Diabetes Neg Hx    Esophageal cancer Neg Hx    Rectal cancer Neg Hx    Stomach cancer Neg Hx     Social History:  reports that she quit smoking about 12 years ago. Her smoking use included cigarettes. She has never used smokeless tobacco. She reports current alcohol use. She reports that she does not use drugs.   Review of Systems   Lipid history: She has been treated with Crestor and Zetia by her PCP with the following results    Lab Results  Component Value Date   CHOL 154 04/20/2021   HDL 61.40 04/20/2021   LDLCALC 62 04/20/2021   TRIG 155.0 (H) 04/20/2021   CHOLHDL 3 04/20/2021        CARDIAC: Last ejection fraction on echo 55%     Hypertension: Has been treated with low-dose Coreg and  spironolactone 25 mg as well as Entresto Blood pressure history:  BP Readings from Last 3 Encounters:  10/21/21 138/64  08/10/21 112/78  08/10/21 118/74   Lab Results  Component Value Date   K 3.8 04/20/2021     Most recent foot exam: 10/22 No symptoms of numbness but occasionally may have some tingling in her lower legs  She had COVID infection diagnosed on 02/17/2020  LABS:  Office Visit on 10/21/2021  Component Date Value Ref Range Status   Hemoglobin A1C 10/21/2021 6.1 (A)  4.0 - 5.6 % Final    Physical Examination:  BP 138/64   Pulse 72   Ht 5' 4"  (1.626 m)   Wt 234 lb 6.4 oz (106.3 kg)   SpO2 96%   BMI 40.23 kg/m       ASSESSMENT:  Diabetes type 2 with BMI around 40  See history of present illness for detailed discussion of current diabetes assessment and problems identified  A1c is stable at 6.1  Current treatment is metformin ER 1500 mg daily  She has had consistent control of her diabetes She is doing well with diet and able to maintain her weight slightly lower Although she may be a candidate for GLP-1 she is very reluctant to consider injections  History of hypercholesterolemia: Well-controlled  PLAN:    No change in 1500 mg metformin She can cut back on checking her sugars in the morning Encouraged her to be  as active as possible To call if blood sugars are starting to rise more than usual  Discussed continuing medications for blood pressure and lipids and follow-up with PCP Reassured her that she is doing all possible measures to reduce cardiovascular risk  Follow-up in 6 months unless follow-up needed earlier   There are no Patient Instructions on file for this visit.     Elayne Snare 10/21/2021, 10:35 AM   Note: This office note was prepared with Dragon voice recognition system technology. Any transcriptional errors that result from this process are unintentional.  Addendum: Urine microalbumin, lipids normal, glucose 143

## 2021-11-08 ENCOUNTER — Encounter: Payer: Self-pay | Admitting: *Deleted

## 2021-11-11 ENCOUNTER — Other Ambulatory Visit: Payer: Medicare Other

## 2021-12-02 ENCOUNTER — Other Ambulatory Visit: Payer: Medicare Other

## 2021-12-02 DIAGNOSIS — U071 COVID-19: Secondary | ICD-10-CM | POA: Diagnosis not present

## 2021-12-02 DIAGNOSIS — E119 Type 2 diabetes mellitus without complications: Secondary | ICD-10-CM | POA: Diagnosis not present

## 2021-12-02 DIAGNOSIS — R531 Weakness: Secondary | ICD-10-CM | POA: Diagnosis not present

## 2021-12-02 DIAGNOSIS — R110 Nausea: Secondary | ICD-10-CM | POA: Diagnosis not present

## 2021-12-02 DIAGNOSIS — R519 Headache, unspecified: Secondary | ICD-10-CM | POA: Diagnosis not present

## 2021-12-02 DIAGNOSIS — R059 Cough, unspecified: Secondary | ICD-10-CM | POA: Diagnosis not present

## 2021-12-02 DIAGNOSIS — I509 Heart failure, unspecified: Secondary | ICD-10-CM | POA: Diagnosis not present

## 2021-12-02 DIAGNOSIS — I10 Essential (primary) hypertension: Secondary | ICD-10-CM | POA: Diagnosis not present

## 2021-12-03 ENCOUNTER — Other Ambulatory Visit: Payer: Self-pay | Admitting: Cardiology

## 2021-12-03 DIAGNOSIS — R059 Cough, unspecified: Secondary | ICD-10-CM | POA: Diagnosis not present

## 2021-12-22 ENCOUNTER — Telehealth: Payer: Self-pay | Admitting: Physician Assistant

## 2021-12-22 NOTE — Telephone Encounter (Signed)
Copied from [place] [id number]. Topic: Medicare AWV >> Dec 22, 2021 11:22 AM Gillis Santa wrote: Reason for CRM: LVM FOR PATIENT TO CALL [phone] TO SCHEDULE AWVI WITH HEALTH COACH, [place]

## 2021-12-28 ENCOUNTER — Ambulatory Visit (INDEPENDENT_AMBULATORY_CARE_PROVIDER_SITE_OTHER): Payer: Medicare Other | Admitting: Physician Assistant

## 2021-12-28 ENCOUNTER — Encounter: Payer: Self-pay | Admitting: Physician Assistant

## 2021-12-28 VITALS — BP 106/69 | HR 80 | Temp 97.8°F | Ht 64.0 in | Wt 231.2 lb

## 2021-12-28 DIAGNOSIS — I110 Hypertensive heart disease with heart failure: Secondary | ICD-10-CM

## 2021-12-28 DIAGNOSIS — E119 Type 2 diabetes mellitus without complications: Secondary | ICD-10-CM | POA: Diagnosis not present

## 2021-12-28 NOTE — Progress Notes (Signed)
Subjective:    Patient ID: [name], female    DOB: 02/20/1955, 66 y.o.   MRN: [id number]  Chief Complaint  Patient presents with   Follow-up   Diabetes    HPI Patient is in today for routine follow-up.  She has a history of diabetes and hypertension, well controlled.  It has been almost 1 year since her last visit with me.  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  She has a form that she would like completed for her employer today agreeing that she can work.  She recently was in the emergency department with COVID-19 and has recovered well.  She also has her recent DOT card with her, that has cleared her for work.  Past Medical History:  Diagnosis Date   Anxiety    Aortic stenosis, mild    echo 2017   Breast cancer ([place]) 1996   left breast   Carpal tunnel syndrome    Cataract    Chronic diastolic CHF (congestive heart failure) (HCC)    NYHA class II   COVID-19    12/02/21   Diabetes mellitus without complication (HCC)    Hyperlipidemia    Hypertension    Left bundle branch block (LBBB)    Low back pain    Neuropathy    NICM (nonischemic cardiomyopathy) ([place])    EF 30%, cath 05/20/2015 clean coronary.  EF resolved by echo with EF 55%   OSA on CPAP    Prediabetes    Pulmonary HTN ([place]) 05/2015   Severe with PASP 67/57mHg - resolved on followup echo   PVC's (premature ventricular contractions) 11/04/2015   Sleep apnea    cpap    Past Surgical History:  Procedure Laterality Date   BREAST SURGERY     CARDIAC CATHETERIZATION N/A 05/20/2015   Procedure: Right/Left Heart Cath and Coronary Angiography;  Surgeon: TTroy Sine MD;  [facility];  Service: Cardiovascular;  Laterality: N/A;   CATARACT EXTRACTION W/ INTRAOCULAR LENS  IMPLANT, BILATERAL     COLONOSCOPY  2018   DILATION AND CURETTAGE OF UTERUS     lumpectomy left breast     POLYPECTOMY     UTERINE FIBROID SURGERY      Family History   Problem Relation Age of Onset   Arthritis Mother    Depression Mother    Hearing loss Mother    Hyperlipidemia Mother    Miscarriages / SKoreaMother    Colon cancer Mother 80       2010   Colon polyps Mother    Alcohol abuse Father    Cancer Father        PROSTATE   Depression Father    Hypertension Father    Hyperlipidemia Father    Drug abuse Brother    Early death Maternal Grandmother    Cancer Paternal Grandmother    Heart attack Neg Hx    Diabetes Neg Hx    Esophageal cancer Neg Hx    Rectal cancer Neg Hx    Stomach cancer Neg Hx     Social History   Tobacco Use   Smoking status: Former    Types: Cigarettes    Quit date: 06/18/2009    Years since quitting: 12.5   Smokeless tobacco: Never  Vaping Use   Vaping Use: Never used  Substance Use Topics   Alcohol use: Yes    Comment: once every  2 months   Drug use: No     Allergies  Allergen Reactions   Jardiance [Empagliflozin] Anaphylaxis, Itching, Rash and Other (See Comments)    Yeast infection   Atorvastatin Other (See Comments)    REACTION: mylagias   Pravastatin Sodium Other (See Comments)    REACTION: myalgias   Sulfa Antibiotics Hives    Review of Systems NEGATIVE UNLESS OTHERWISE INDICATED IN HPI      Objective:     BP 106/69 (BP Location: Right Arm, Patient Position: Sitting)   Pulse 80   Temp 97.8 F (36.6 C) (Temporal)   Ht '5\' 4"'$  (1.626 m)   Wt 231 lb 3.2 oz (104.9 kg)   SpO2 96%   BMI 39.69 kg/m   Wt Readings from Last 3 Encounters:  12/28/21 231 lb 3.2 oz (104.9 kg)  10/21/21 234 lb 6.4 oz (106.3 kg)  08/10/21 238 lb (108 kg)    BP Readings from Last 3 Encounters:  12/28/21 106/69  10/21/21 138/64  08/10/21 112/78     Physical Exam Vitals reviewed.  Constitutional:      Appearance: Normal appearance. She is well-developed. She is obese.  HENT:     Head: Normocephalic and atraumatic.  Eyes:     Conjunctiva/sclera: Conjunctivae normal.     Pupils: Pupils are  equal, round, and reactive to light.  Neck:     Thyroid: No thyromegaly.     Vascular: No carotid bruit.  Cardiovascular:     Rate and Rhythm: Normal rate and regular rhythm.     Heart sounds: Normal heart sounds. No murmur heard. Pulmonary:     Effort: Pulmonary effort is normal.     Breath sounds: Normal breath sounds.  Musculoskeletal:     Cervical back: Normal range of motion and neck supple.     Right lower leg: Edema present.     Left lower leg: Edema present.     Comments: Non-pitting edema BLE  Lymphadenopathy:     Cervical: No cervical adenopathy.  Skin:    General: Skin is warm and dry.     Capillary Refill: Capillary refill takes less than 2 seconds.     Findings: No rash.  Neurological:     General: No focal deficit present.     Mental Status: She is alert and oriented to person, place, and time.     Cranial Nerves: No cranial nerve deficit.     Coordination: Coordination normal.     Deep Tendon Reflexes: Reflexes normal.  Psychiatric:        Behavior: Behavior normal.        Assessment & Plan:  Type 2 diabetes mellitus without complication, without long-term current use of insulin [facility]) Assessment & Plan: Patient follows with endocrinology, Dr. Dwyane Dee.  Her diabetes is stable and well-controlled.  She takes metformin XR 500 mg 3 tablets by mouth once daily with supper.  He continues to work nutrition and walking when able. Lab Results  Component Value Date   HGBA1C 6.1 (A) 10/21/2021      Hypertensive heart disease with congestive heart failure, unspecified heart failure type [facility]) Assessment & Plan: Patient follows regularly with her cardiologist.  Her blood pressure is stable and at goal.  She takes morning aspirin 81 mg daily.She takes Coreg 6.25 mg twice daily.  She also takes Entresto 24-26 mg 1 tablet twice daily, and spironolactone 25 mg 1/2 tablet by mouth once daily.  She is asymptomatic.  She is doing well.  Return in about 4 weeks  (around 01/25/2022) for [place] - PHONE OK.  This note was prepared with assistance of Systems analyst. Occasional wrong-word or sound-a-like substitutions may have occurred due to the inherent limitations of voice recognition software.     Jasminne Mealy M Ronny Korff, PA-C

## 2022-01-02 NOTE — Assessment & Plan Note (Signed)
Patient follows regularly with her cardiologist.  Her blood pressure is stable and at goal.  She takes morning aspirin 81 mg daily.She takes Coreg 6.25 mg twice daily.  She also takes Entresto 24-26 mg 1 tablet twice daily, and spironolactone 25 mg 1/2 tablet by mouth once daily.  She is asymptomatic.  She is doing well.

## 2022-01-02 NOTE — Assessment & Plan Note (Signed)
Patient follows with endocrinology, Dr. Dwyane Dee.  Her diabetes is stable and well-controlled.  She takes metformin XR 500 mg 3 tablets by mouth once daily with supper.  He continues to work nutrition and walking when able. Lab Results  Component Value Date   HGBA1C 6.1 (A) 10/21/2021

## 2022-01-13 DIAGNOSIS — G4733 Obstructive sleep apnea (adult) (pediatric): Secondary | ICD-10-CM | POA: Diagnosis not present

## 2022-01-24 ENCOUNTER — Other Ambulatory Visit: Payer: Self-pay

## 2022-01-24 MED ORDER — ENTRESTO 24-26 MG PO TABS: 1.0000 | ORAL_TABLET | Freq: Two times a day (BID) | ORAL | 1 refills | Status: DC

## 2022-01-29 ENCOUNTER — Encounter (HOSPITAL_BASED_OUTPATIENT_CLINIC_OR_DEPARTMENT_OTHER): Payer: Self-pay | Admitting: Emergency Medicine

## 2022-01-29 ENCOUNTER — Other Ambulatory Visit: Payer: Self-pay

## 2022-01-29 ENCOUNTER — Emergency Department (HOSPITAL_BASED_OUTPATIENT_CLINIC_OR_DEPARTMENT_OTHER)
Admission: EM | Admit: 2022-01-29 | Discharge: 2022-01-29 | Disposition: A | Discharge status: Home / Self Care | Payer: Medicare Other | Attending: Emergency Medicine | Admitting: Emergency Medicine

## 2022-01-29 DIAGNOSIS — E119 Type 2 diabetes mellitus without complications: Secondary | ICD-10-CM | POA: Insufficient documentation

## 2022-01-29 DIAGNOSIS — I509 Heart failure, unspecified: Secondary | ICD-10-CM | POA: Insufficient documentation

## 2022-01-29 DIAGNOSIS — Z7982 Long term (current) use of aspirin: Secondary | ICD-10-CM | POA: Insufficient documentation

## 2022-01-29 DIAGNOSIS — Z79899 Other long term (current) drug therapy: Secondary | ICD-10-CM | POA: Insufficient documentation

## 2022-01-29 DIAGNOSIS — K047 Periapical abscess without sinus: Secondary | ICD-10-CM

## 2022-01-29 DIAGNOSIS — I10 Essential (primary) hypertension: Secondary | ICD-10-CM | POA: Diagnosis not present

## 2022-01-29 DIAGNOSIS — Z7984 Long term (current) use of oral hypoglycemic drugs: Secondary | ICD-10-CM | POA: Diagnosis not present

## 2022-01-29 DIAGNOSIS — K0889 Other specified disorders of teeth and supporting structures: Secondary | ICD-10-CM | POA: Insufficient documentation

## 2022-01-29 MED ORDER — KETOROLAC TROMETHAMINE 60 MG/2ML IM SOLN
15.0000 mg | Freq: Once | INTRAMUSCULAR | Status: AC
  Administered 2022-01-29: 15 mg via INTRAMUSCULAR
  Filled 2022-01-29: qty 2

## 2022-01-29 MED ORDER — KETOROLAC TROMETHAMINE 15 MG/ML IJ SOLN
15.0000 mg | Freq: Once | INTRAMUSCULAR | Status: DC
  Filled 2022-01-29: qty 1

## 2022-01-29 MED ORDER — BUPIVACAINE-EPINEPHRINE (PF) 0.5% -1:200000 IJ SOLN
10.0000 mL | Freq: Once | INTRAMUSCULAR | Status: AC
  Administered 2022-01-29: 10 mL
  Filled 2022-01-29: qty 10.8

## 2022-01-29 MED ORDER — AMOXICILLIN-POT CLAVULANATE 875-125 MG PO TABS
1.0000 | ORAL_TABLET | Freq: Once | ORAL | Status: AC
  Administered 2022-01-29: 1 via ORAL
  Filled 2022-01-29: qty 1

## 2022-01-29 MED ORDER — AMOXICILLIN-POT CLAVULANATE 875-125 MG PO TABS: 1.0000 | ORAL_TABLET | Freq: Two times a day (BID) | ORAL | 0 refills | Status: DC

## 2022-01-29 NOTE — ED Provider Notes (Signed)
[facility] EMERGENCY DEPARTMENT Provider Note   CSN: [id number] Arrival date & time: 01/29/22  1220     History  Chief Complaint  Patient presents with   Dental Pain    [name] is a 66 y.o. female.  Is a 66 year old female with history of congestive heart failure and diabetes who presents the ER complaining of left upper dental pain for the past week.  She has been taking Tylenol without relief.  No fevers or chills.  Chest pain or shortness of breath, no trouble swallowing or breathing.  She states the pain has been getting worse so she decided to come to the ED for further evaluation.  She has a Pharmacist, community but has not seen them for this issue yet.  No drainage from the mouth.   Dental Pain      Home Medications Prior to Admission medications   Medication Sig Start Date End Date Taking? Authorizing Provider  acetaminophen (TYLENOL) 650 MG CR tablet Take 1,300 mg by mouth as needed for pain.    [provider]  aspirin 81 MG tablet Take 81 mg by mouth daily.    [provider]  Blood Glucose Monitoring Suppl ([place]) w/Device KIT Use onetouch verio flex meter to check blood sugar 3 times weekly, alternating between fasting and 2 hours after meals. 10/03/19   Elayne Snare, MD  carvedilol (COREG) 6.25 MG tablet TAKE 1 TABLET BY MOUTH TWICE DAILY WITH A MEAL 09/07/21   Bhagat, Bhavinkumar, PA  cetirizine (ZYRTEC) 10 MG tablet Take 1 tablet (10 mg total) by mouth daily. 09/28/20   Nche, Charlene Brooke, NP  clotrimazole (LOTRIMIN) 1 % cream Apply 1 Application topically 2 (two) times daily. 08/10/21   Leamon Arnt, MD  diclofenac Sodium (VOLTAREN) 1 % GEL Apply topically 4 (four) times daily. Arthritis    [provider]  diphenhydrAMINE HCl (BENADRYL ALLERGY PO) Take by mouth.    [provider]  ezetimibe (ZETIA) 10 MG tablet Take 1 tablet (10 mg total) by mouth daily. 08/10/21   Bhagat, Bhavinkumar, PA  fluticasone  (FLONASE) 50 MCG/ACT nasal spray Place 2 sprays into both nostrils daily. 09/28/20   Nche, Charlene Brooke, NP  furosemide (LASIX) 40 MG tablet TAKE 1 TO 2 TABLETS BY MOUTH ONCE DAILY AS DIRECTED 12/03/21   Sueanne Margarita, MD  gabapentin (NEURONTIN) 100 MG capsule Take 1 capsule (100 mg total) by mouth 3 (three) times daily as needed. 01/27/21   [name], Randa Evens, PA-C  glucose blood (ONETOUCH VERIO) test strip USE 1 STRIP TO CHECK BLOOD SUGAR TWO TO THREE TIMES A DAY 08/09/21   Elayne Snare, MD  metFORMIN (GLUCOPHAGE-XR) 500 MG 24 hr tablet TAKE 3 TABLETS BY MOUTH ONCE DAILY WITH SUPPER 06/07/21   Elayne Snare, MD  Multiple Vitamin (MULTIVITAMIN) tablet Take 1 tablet by mouth daily.     [provider]  OneTouch Delica Lancets 21J MISC Use OneTouch Delica lancets to check blood sugar 3 times weekly, alternating between fasting and 2 hours after meals. 10/03/19   Elayne Snare, MD  PAXLOVID, 300/100, 20 x 150 MG & 10 x 100MG TBPK Take by mouth. 12/03/21   [provider]  rosuvastatin (CRESTOR) 5 MG tablet Take 1 tablet (5 mg total) by mouth daily. 08/10/21   Leamon Arnt, MD  sacubitril-valsartan (ENTRESTO) 24-26 MG Take 1 tablet by mouth 2 (two) times daily. 01/24/22   Sueanne Margarita, MD  spironolactone (ALDACTONE) 25 MG  tablet TAKE 1/2 (ONE-HALF) TABLET BY MOUTH ONCE DAILY . APPOINTMENT REQUIRED FOR FUTURE REFILLS 08/30/21   Leanor Kail, PA  vitamin C (ASCORBIC ACID) 500 MG tablet Take 2,000 mg by mouth as needed.     [provider]      Allergies    Jardiance [empagliflozin], Atorvastatin, Pravastatin sodium, and Sulfa antibiotics    Review of Systems   Review of Systems  HENT:  Positive for dental problem.     Physical Exam Updated Vital Signs BP (!) 141/76 (BP Location: Right Arm)   Pulse 78   Temp 97.6 F (36.4 C) (Oral)   Resp 16   Ht _0  (1.626 m)   Wt 111.1 kg   SpO2 96%   BMI 42.05 kg/m  Physical Exam Vitals and nursing note reviewed.   Constitutional:      General: She is not in acute distress.    Appearance: She is well-developed.  HENT:     Head: Normocephalic and atraumatic.     Jaw: No trismus, tenderness, swelling or pain on movement.     Nose: Nose normal.     Mouth/Throat:     Lips: Pink.     Mouth: Mucous membranes are moist.     Dentition: Dental tenderness present.     Pharynx: Oropharynx is clear.  Eyes:     Conjunctiva/sclera: Conjunctivae normal.  Cardiovascular:     Rate and Rhythm: Normal rate and regular rhythm.     Heart sounds: No murmur heard. Pulmonary:     Effort: Pulmonary effort is normal. No respiratory distress.     Breath sounds: Normal breath sounds.  Abdominal:     Palpations: Abdomen is soft.     Tenderness: There is no abdominal tenderness.  Musculoskeletal:        General: No swelling.     Cervical back: Neck supple.  Skin:    General: Skin is warm and dry.     Capillary Refill: Capillary refill takes less than 2 seconds.  Neurological:     Mental Status: She is alert.  Psychiatric:        Mood and Affect: Mood normal.     ED Results / Procedures / Treatments   Labs (all labs ordered are listed, but only abnormal results are displayed) Labs Reviewed - No data to display  EKG EKG Interpretation  Date/Time:  Saturday January 29 2022 12:54:48 EST Ventricular Rate:  79 PR Interval:  172 QRS Duration: 136 QT Interval:  398 QTC Calculation: 456 R Axis:   69 Text Interpretation: Normal sinus rhythm Non-specific intra-ventricular conduction block Minimal voltage criteria for LVH, may be normal variant ( Cornell product ) Cannot rule out Anteroseptal infarct , age undetermined Abnormal ECG When compared with ECG of 07-Nov-2016 12:02, PREVIOUS ECG IS PRESENT Similar to 2018 tracing Confirmed by Nanda Quinton [id number]) on 01/29/2022 1:04:13 PM  Radiology No results found.  Procedures .Nerve Block  Date/Time: 01/29/2022 2:28 PM  Performed by: Gwenevere Abbot,  PA-C Authorized by: Gwenevere Abbot, PA-C   Consent:    Consent obtained:  Verbal   Consent given by:  Patient Universal protocol:    Procedure explained and questions answered to patient or proxy's satisfaction: yes   Indications:    Indications:  Pain relief Location:    Nerve block body site: periapical-tooth #13.   Laterality:  Left Skin anesthesia:    Skin anesthesia method:  None Procedure details:    Block needle gauge:  27 G  Anesthetic injected:  Bupivacaine 0.5% WITH epi   Injection procedure:  Negative aspiration for blood Post-procedure details:    Procedure completion:  Tolerated well, no immediate complications Comments:     Pt had good pain relief      Medications Ordered in ED Medications - No data to display  ED Course/ Medical Decision Making/ A&P                           Medical Decision Making Diagnosis: Dental abscess, sinusitis, periorbital cellulitis, other ED course: Patient presents with several days of left upper dental pain, worse with chewing, other no drainable collection identified, she has no trismus, no trouble swallowing or breathing, no sublingual or submental edema or tenderness.  No signs of looking for angina, there is no facial cellulitis.  There is localized pain to the #13 tooth.  Patient was offered dental block but was agreeable with this.  She got immediate pain relief with this.  Given Toradol in the ED to help with continued pain control advised on antibiotics, dental follow-up on Monday and over-the-counter Tylenol at home for pain.  She states she does have a private dentist to follow-up with.  She was given strict return precautions.           Final Clinical Impression(s) / ED Diagnoses Final diagnoses:  None    Rx / DC Orders ED Discharge Orders     None         Gwenevere Abbot, PA-C 01/29/22 1432    Margette Fast, MD 01/30/22 1036

## 2022-01-29 NOTE — Discharge Instructions (Addendum)
Is being treated with antibiotics, follow-up with the dentist on Monday.  Come back to the ER for any new or worsening symptoms.  you can take over-the-counter Tylenol as directed on the packaging to help with the pain.

## 2022-01-29 NOTE — ED Triage Notes (Signed)
Pt arrives pov, c/o LT side dental pain x 1 week. Denies injury. Denies CP

## 2022-01-29 NOTE — ED Notes (Signed)
Pt discharged to home. Discharge instructions have been discussed with patient and/or family members. Pt verbally acknowledges understanding d/c instructions, and endorses comprehension to checkout at registration before leaving.  °

## 2022-02-03 ENCOUNTER — Telehealth: Payer: Self-pay | Admitting: Physician Assistant

## 2022-02-03 NOTE — Telephone Encounter (Signed)
Copied from [place] [id number]. Topic: Medicare AWV >> Feb 03, 2022  8:56 AM Gillis Santa wrote: Reason for CRM: LVM PATIENT TO CALL [phone] TO [place]

## 2022-04-11 ENCOUNTER — Telehealth: Payer: Self-pay

## 2022-04-11 NOTE — Telephone Encounter (Signed)
**Note De-Identified [name] Obfuscation** The pts completed NPAF application for Entresto assistance was left at the office with documents.  I have competed the providers page of her application and have e-mailed all to the nurse working with Dr Radford Pax today so she can obtain her signature, date it, and to then fax all to NPAF at the fax number written on the cover letter included.

## 2022-04-14 ENCOUNTER — Other Ambulatory Visit: Payer: Self-pay

## 2022-04-14 ENCOUNTER — Telehealth: Payer: Self-pay | Admitting: Physician Assistant

## 2022-04-14 MED ORDER — CARVEDILOL 6.25 MG PO TABS: 6.2500 mg | ORAL_TABLET | Freq: Two times a day (BID) | ORAL | 3 refills | Status: DC

## 2022-04-14 NOTE — Telephone Encounter (Addendum)
**Note De-Identified [name] Obfuscation** I called NPAF and s/w Lysbeth Galas who advised me that the pt was approved for [facility] assistance on 04/13/2022 until 02/14/2023. Pt ID: [id number]  Lysbeth Galas also advised me that the pt needs to contact them-NPAF at [phone] to finalize her application process.  I called the pt and advised her of the above and that we are leaving her 2 weeks of Entresto 24-26 mg samples in the front office for her to pick up and she is aware to call NPAF to finalize her application.  She thanked me for our assistance.

## 2022-04-14 NOTE — Telephone Encounter (Signed)
Pt calling back for an update on [employer] paperwork. She states she may need some samples depending on when paperwork is completed

## 2022-04-14 NOTE — Telephone Encounter (Signed)
Leaving pt 1 bottle of Entresto 24-26 mg tablets samples at [employer] at [place] front desk for pt to pick up.  Lot#  [id number]   Exp: 09/2023   Juluis Rainier

## 2022-04-14 NOTE — Telephone Encounter (Signed)
Contacted Alcide Evener [facility] to schedule their annual wellness visit. Appointment made for 04/26/2022.  [place] Direct Dial [phone]

## 2022-04-20 LAB — HM MAMMOGRAPHY

## 2022-04-21 ENCOUNTER — Ambulatory Visit: Payer: Medicare Other | Admitting: Endocrinology

## 2022-04-22 NOTE — Telephone Encounter (Signed)
PATIENT WAS APPROVED FOR ENTRESTO UNTIL 02/14/23. PATIENT ID # [id number]

## 2022-04-26 ENCOUNTER — Ambulatory Visit (INDEPENDENT_AMBULATORY_CARE_PROVIDER_SITE_OTHER): Payer: Medicare Other

## 2022-04-26 ENCOUNTER — Encounter: Payer: Self-pay | Admitting: Physician Assistant

## 2022-04-26 ENCOUNTER — Ambulatory Visit: Payer: Medicare Other | Admitting: Physician Assistant

## 2022-04-26 VITALS — Wt 235.0 lb

## 2022-04-26 DIAGNOSIS — M25512 Pain in left shoulder: Secondary | ICD-10-CM | POA: Diagnosis not present

## 2022-04-26 DIAGNOSIS — Z0000 Encounter for general adult medical examination without abnormal findings: Secondary | ICD-10-CM

## 2022-04-26 MED ORDER — LIDOCAINE HCL 1 % IJ SOLN
2.0000 mL | INTRAMUSCULAR | Status: AC | PRN
  Administered 2022-04-26: 2 mL

## 2022-04-26 MED ORDER — BUPIVACAINE HCL 0.25 % IJ SOLN
2.0000 mL | INTRAMUSCULAR | Status: AC | PRN
  Administered 2022-04-26: 2 mL via INTRA_ARTICULAR

## 2022-04-26 MED ORDER — METHYLPREDNISOLONE ACETATE 40 MG/ML IJ SUSP
40.0000 mg | INTRAMUSCULAR | Status: AC | PRN
  Administered 2022-04-26: 40 mg via INTRA_ARTICULAR

## 2022-04-26 NOTE — Progress Notes (Signed)
Office Visit Note   Patient: [name]           Date of Birth: 09/03/1955           MRN: [id number] Visit Date: 04/26/2022              Requested by: [name], Randa Evens, PA-C [place],  [place] [id number] PCP: Joanna Lathe, PA-C   Assessment & Plan: Visit Diagnoses:  1. Acute pain of left shoulder     Plan: Pleasant left-hand-dominant 67 year old woman who have seen in the past for right shoulder pain.  She comes in today with a 4 to 5-day history of left shoulder pain.  No particular injury hurts when she goes overhead and behind her back.  X-ray does demonstrate arthritis of the [facility] joint.  Glenohumeral joint is fairly well-preserved.  Finding consistent with biceps finding with a positive speeds test and impingement findings.  Discussed the natural history of this with the patient.  She has had an injection into the right side and done well wondering if this may help her.  She has diabetes but is controlled.  We will go forward with an injection today she will follow-up as needed  Follow-Up Instructions: Return if symptoms worsen or fail to improve.   Orders:  Orders Placed This Encounter  Procedures   XR Shoulder Left   No orders of the defined types were placed in this encounter.     Procedures: Large Joint Inj: L subacromial bursa on 04/26/2022 9:37 AM Indications: diagnostic evaluation and pain Details: 25 G 1.5 in needle, posterior approach  Arthrogram: No  Medications: 2 mL lidocaine 1 %; 40 mg methylPREDNISolone acetate 40 MG/ML; 2 mL bupivacaine 0.25 % Outcome: tolerated well, no immediate complications Procedure, treatment alternatives, risks and benefits explained, specific risks discussed. Consent was given by the patient.     Clinical Data: No additional findings.   Subjective: Chief Complaint  Patient presents with   Left Shoulder - Pain    HPIEvette is a pleasant 67 year old woman who I have seen in the past for right  shoulder pain.  She comes in today complaining of left shoulder pain for about 4 5 days.  She denies any particular injuries but she does drive for living.  She points to the pain in her anterior shoulder.  She is a diabetic but fairly well-controlled.  Pain Assessment  Average Pain: 6 Current Pain: 6 Aggravating Factors: sleeping on her left shoulder. Overhead activity Alleviating Factors:  has taken ibuprofen but is not supposed to use NSAIDS Review of Systems  All other systems reviewed and are negative.    Objective: Vital Signs: There were no vitals taken for this visit.  Physical Exam Constitutional:      Appearance: Normal appearance.  Pulmonary:     Effort: Pulmonary effort is normal.  Skin:    General: Skin is warm and dry.  Neurological:     Mental Status: She is alert.  Psychiatric:        Mood and Affect: Mood normal.     Ortho Exam Examination of her left shoulder.  She is able to go full forward elevation though gets quite a bit of pain and about 130 degrees.  Pain also present with internal rotation behind her back.  No significant pain on external rotation she has a positive empty can test positive speeds test Specialty Comments:  No specialty comments available.  Imaging: XR Shoulder Left  Result Date:  04/26/2022 Radiographs of her left shoulder were obtained in multiple projections today.  Overall well-maintained glenohumeral joint space.  She does have arthritis of the [facility] joint with sclerotic changes loss of space and osteophyte formation    PMFS History: Patient Active Problem List   Diagnosis Date Noted   Family history of colon cancer in mother 04/15/2020   Shoulder pain, bilateral 12/11/2019   Type 2 diabetes mellitus without complications ([place]) [id number]   PVC's (premature ventricular contractions) 11/04/2015   Aortic stenosis, mild    Heart palpitations 07/28/2015   Morbid obesity ([place]) 05/23/2015   Hypertensive heart disease     Nonischemic cardiomyopathy (HCC)    NSVT (nonsustained ventricular tachycardia) ([place]) 05/21/2015   Chronic diastolic heart failure ([place]) 05/20/2015   OSA (obstructive sleep apnea) 01/02/2014   LBBB (left bundle branch block) 01/02/2014   Mixed hyperlipidemia 04/15/2010   Personal history of malignant neoplasm of breast 04/15/2010   Past Medical History:  Diagnosis Date   Anxiety    Aortic stenosis, mild    echo 2017   Breast cancer ([place]) 1996   left breast   Carpal tunnel syndrome    Cataract    Chronic diastolic CHF (congestive heart failure) (HCC)    NYHA class II   COVID-19    12/02/21   Diabetes mellitus without complication (HCC)    Hyperlipidemia    Hypertension    Left bundle branch block (LBBB)    Low back pain    Neuropathy    NICM (nonischemic cardiomyopathy) ([place])    EF 30%, cath 05/20/2015 clean coronary.  EF resolved by echo with EF 55%   OSA on CPAP    Prediabetes    Pulmonary HTN ([place]) 05/2015   Severe with PASP 67/96mHg - resolved on followup echo   PVC's (premature ventricular contractions) 11/04/2015   Sleep apnea    cpap    Family History  Problem Relation Age of Onset   Arthritis Mother    Depression Mother    Hearing loss Mother    Hyperlipidemia Mother    Miscarriages / SKoreaMother    Colon cancer Mother 80       2010   Colon polyps Mother    Alcohol abuse Father    Cancer Father        PROSTATE   Depression Father    Hypertension Father    Hyperlipidemia Father    Drug abuse Brother    Early death Maternal Grandmother    Cancer Paternal Grandmother    Heart attack Neg Hx    Diabetes Neg Hx    Esophageal cancer Neg Hx    Rectal cancer Neg Hx    Stomach cancer Neg Hx     Past Surgical History:  Procedure Laterality Date   BREAST SURGERY     CARDIAC CATHETERIZATION N/A 05/20/2015   Procedure: Right/Left Heart Cath and Coronary Angiography;  Surgeon: TTroy Sine MD;  [facility];  Service: Cardiovascular;   Laterality: N/A;   CATARACT EXTRACTION W/ INTRAOCULAR LENS  IMPLANT, BILATERAL     COLONOSCOPY  2018   DILATION AND CURETTAGE OF UTERUS     lumpectomy left breast     POLYPECTOMY     UTERINE FIBROID SURGERY     Social History   Occupational History   Occupation: Truck DGeophysicist/field seismologist Tobacco Use   Smoking status: Former    Types: Cigarettes    Quit date: 06/18/2009    Years since quitting: 12.8  Smokeless tobacco: Never  Vaping Use   Vaping Use: Never used  Substance and Sexual Activity   Alcohol use: Yes    Comment: once every 2 months   Drug use: No   Sexual activity: Yes

## 2022-04-26 NOTE — Patient Instructions (Signed)
[name] , Thank you for taking time to come for your Medicare Wellness Visit. I appreciate your ongoing commitment to your health goals. Please review the following plan we discussed and let me know if I can assist you in the future.   These are the goals we discussed:  Goals      Patient Stated     Weight loss         This is a list of the screening recommended for you and due dates:  Health Maintenance  Topic Date Due   DTaP/Tdap/Td vaccine (1 - Tdap) Never done   COVID-19 Vaccine (4 - 2023-24 season) 10/15/2021   Complete foot exam   12/02/2021   Yearly kidney function blood test for diabetes  04/21/2022   Yearly kidney health urinalysis for diabetes  04/21/2022   Hemoglobin A1C  04/21/2022   DEXA scan (bone density measurement)  12/29/2022*   Eye exam for diabetics  06/15/2022   Colon Cancer Screening  10/16/2022   Mammogram  04/20/2023   Medicare Annual Wellness Visit  04/26/2023   Pneumonia Vaccine  Completed   Flu Shot  Completed   Hepatitis C Screening: USPSTF Recommendation to screen - Ages 18-79 yo.  Completed   Zoster (Shingles) Vaccine  Completed   HPV Vaccine  Aged Out  *Topic was postponed. The date shown is not the original due date.    Advanced directives: Advance directive discussed with you today. Even though you declined this today please call our office should you change your mind and we can give you the proper paperwork for you to fill out.  Conditions/risks identified: continue to lose weight   Next appointment: Follow up in one year for your annual wellness visit    Preventive Care 65 Years and Older, Female Preventive care refers to lifestyle choices and visits with your health care provider that can promote health and wellness. What does preventive care include? A yearly physical exam. This is also called an annual well check. Dental exams once or twice a year. Routine eye exams. Ask your health care provider how often you should have your eyes  checked. Personal lifestyle choices, including: Daily care of your teeth and gums. Regular physical activity. Eating a healthy diet. Avoiding tobacco and drug use. Limiting alcohol use. Practicing safe sex. Taking low-dose aspirin every day. Taking vitamin and mineral supplements as recommended by your health care provider. What happens during an annual well check? The services and screenings done by your health care provider during your annual well check will depend on your age, overall health, lifestyle risk factors, and family history of disease. Counseling  Your health care provider may ask you questions about your: Alcohol use. Tobacco use. Drug use. Emotional well-being. Home and relationship well-being. Sexual activity. Eating habits. History of falls. Memory and ability to understand (cognition). Work and work Statistician. Reproductive health. Screening  You may have the following tests or measurements: Height, weight, and BMI. Blood pressure. Lipid and cholesterol levels. These may be checked every 5 years, or more frequently if you are over 57 years old. Skin check. Lung cancer screening. You may have this screening every year starting at age 30 if you have a 30-pack-year history of smoking and currently smoke or have quit within the past 15 years. Fecal occult blood test (FOBT) of the stool. You may have this test every year starting at age 39. Flexible sigmoidoscopy or colonoscopy. You may have a sigmoidoscopy every 5 years or a colonoscopy  every 10 years starting at age 26. Hepatitis C blood test. Hepatitis B blood test. Sexually transmitted disease (STD) testing. Diabetes screening. This is done by checking your blood sugar (glucose) after you have not eaten for a while (fasting). You may have this done every 1-3 years. Bone density scan. This is done to screen for osteoporosis. You may have this done starting at age 34. Mammogram. This may be done every 1-2  years. Talk to your health care provider about how often you should have regular mammograms. Talk with your health care provider about your test results, treatment options, and if necessary, the need for more tests. Vaccines  Your health care provider may recommend certain vaccines, such as: Influenza vaccine. This is recommended every year. Tetanus, diphtheria, and acellular pertussis (Tdap, Td) vaccine. You may need a Td booster every 10 years. Zoster vaccine. You may need this after age 8. Pneumococcal 13-valent conjugate (PCV13) vaccine. One dose is recommended after age 9. Pneumococcal polysaccharide (PPSV23) vaccine. One dose is recommended after age 2. Talk to your health care provider about which screenings and vaccines you need and how often you need them. This information is not intended to replace advice given to you by your health care provider. Make sure you discuss any questions you have with your health care provider. Document Released: 02/27/2015 Document Revised: 10/21/2015 Document Reviewed: 12/02/2014 Elsevier Interactive Patient Education  2017 [place] Prevention in the Home Falls can cause injuries. They can happen to people of all ages. There are many things you can do to make your home safe and to help prevent falls. What can I do on the outside of my home? Regularly fix the edges of walkways and driveways and fix any cracks. Remove anything that might make you trip as you walk through a door, such as a raised step or threshold. Trim any bushes or trees on the path to your home. Use bright outdoor lighting. Clear any walking paths of anything that might make someone trip, such as rocks or tools. Regularly check to see if handrails are loose or broken. Make sure that both sides of any steps have handrails. Any raised decks and porches should have guardrails on the edges. Have any leaves, snow, or ice cleared regularly. Use sand or salt on walking paths  during winter. Clean up any spills in your garage right away. This includes oil or grease spills. What can I do in the bathroom? Use night lights. Install grab bars by the toilet and in the tub and shower. Do not use towel bars as grab bars. Use non-skid mats or decals in the tub or shower. If you need to sit down in the shower, use a plastic, non-slip stool. Keep the floor dry. Clean up any water that spills on the floor as soon as it happens. Remove soap buildup in the tub or shower regularly. Attach bath mats securely with double-sided non-slip rug tape. Do not have throw rugs and other things on the floor that can make you trip. What can I do in the bedroom? Use night lights. Make sure that you have a light by your bed that is easy to reach. Do not use any sheets or blankets that are too big for your bed. They should not hang down onto the floor. Have a firm chair that has side arms. You can use this for support while you get dressed. Do not have throw rugs and other things on the floor that can make  you trip. What can I do in the kitchen? Clean up any spills right away. Avoid walking on wet floors. Keep items that you use a lot in easy-to-reach places. If you need to reach something above you, use a strong step stool that has a grab bar. Keep electrical cords out of the way. Do not use floor polish or wax that makes floors slippery. If you must use wax, use non-skid floor wax. Do not have throw rugs and other things on the floor that can make you trip. What can I do with my stairs? Do not leave any items on the stairs. Make sure that there are handrails on both sides of the stairs and use them. Fix handrails that are broken or loose. Make sure that handrails are as long as the stairways. Check any carpeting to make sure that it is firmly attached to the stairs. Fix any carpet that is loose or worn. Avoid having throw rugs at the top or bottom of the stairs. If you do have throw  rugs, attach them to the floor with carpet tape. Make sure that you have a light switch at the top of the stairs and the bottom of the stairs. If you do not have them, ask someone to add them for you. What else can I do to help prevent falls? Wear shoes that: Do not have high heels. Have rubber bottoms. Are comfortable and fit you well. Are closed at the toe. Do not wear sandals. If you use a stepladder: Make sure that it is fully opened. Do not climb a closed stepladder. Make sure that both sides of the stepladder are locked into place. Ask someone to hold it for you, if possible. Clearly mark and make sure that you can see: Any grab bars or handrails. First and last steps. Where the edge of each step is. Use tools that help you move around (mobility aids) if they are needed. These include: Canes. Walkers. Scooters. Crutches. Turn on the lights when you go into a dark area. Replace any light bulbs as soon as they burn out. Set up your furniture so you have a clear path. Avoid moving your furniture around. If any of your floors are uneven, fix them. If there are any pets around you, be aware of where they are. Review your medicines with your doctor. Some medicines can make you feel dizzy. This can increase your chance of falling. Ask your doctor what other things that you can do to help prevent falls. This information is not intended to replace advice given to you by your health care provider. Make sure you discuss any questions you have with your health care provider. Document Released: 11/27/2008 Document Revised: 07/09/2015 Document Reviewed: 03/07/2014 Elsevier Interactive Patient Education  2017 [employer].

## 2022-04-26 NOTE — Progress Notes (Signed)
I connected with  [name] on 04/26/22 by a audio enabled telemedicine application and verified that I am speaking with the correct person using two identifiers.  Patient Location: Home  Provider Location: Office/Clinic  I discussed the limitations of evaluation and management by telemedicine. The patient expressed understanding and agreed to proceed.   Subjective:   [name] is a 67 y.o. female who presents for Medicare Annual (Subsequent) preventive examination.  Review of Systems     Cardiac Risk Factors include: advanced age (>29mn, >>20women);obesity (BMI >30kg/m2);diabetes mellitus;hypertension;dyslipidemia     Objective:    Today's Vitals   04/26/22 0904  Weight: 235 lb (106.6 kg)   Body mass index is 40.34 kg/m.     04/26/2022    9:13 AM 01/29/2022   12:50 PM 11/23/2019    5:04 PM 11/15/2019   10:54 AM 10/20/2019    2:47 PM 11/08/2016    4:00 AM 05/02/2016   10:04 AM  Advanced Directives  Does Patient Have a Medical Advance Directive? No No No No No No No  Would patient like information on creating a medical advance directive? No - Patient declined   Yes (MAU/Ambulatory/Procedural Areas - Information given)  No - Patient declined     Current Medications (verified) Outpatient Encounter Medications as of 04/26/2022  Medication Sig   acetaminophen (TYLENOL) 650 MG CR tablet Take 1,300 mg by mouth as needed for pain.   aspirin 81 MG tablet Take 81 mg by mouth daily.   Blood Glucose Monitoring Suppl (ONETOUCH VERIO FLEX SYSTEM) w/Device KIT Use onetouch verio flex meter to check blood sugar 3 times weekly, alternating between fasting and 2 hours after meals.   carvedilol (COREG) 6.25 MG tablet Take 1 tablet (6.25 mg total) by mouth 2 (two) times daily with a meal.   cetirizine (ZYRTEC) 10 MG tablet Take 1 tablet (10 mg total) by mouth daily.   clotrimazole (LOTRIMIN) 1 % cream Apply 1 Application topically 2 (two) times daily.   diclofenac Sodium (VOLTAREN) 1  % GEL Apply topically 4 (four) times daily. Arthritis   diphenhydrAMINE HCl (BENADRYL ALLERGY PO) Take by mouth.   ezetimibe (ZETIA) 10 MG tablet Take 1 tablet (10 mg total) by mouth daily.   furosemide (LASIX) 40 MG tablet TAKE 1 TO 2 TABLETS BY MOUTH ONCE DAILY AS DIRECTED   gabapentin (NEURONTIN) 100 MG capsule Take 1 capsule (100 mg total) by mouth 3 (three) times daily as needed.   glucose blood (ONETOUCH VERIO) test strip USE 1 STRIP TO CHECK BLOOD SUGAR TWO TO THREE TIMES A DAY   metFORMIN (GLUCOPHAGE-XR) 500 MG 24 hr tablet TAKE 3 TABLETS BY MOUTH ONCE DAILY WITH SUPPER   Multiple Vitamin (MULTIVITAMIN) tablet Take 1 tablet by mouth daily.    OneTouch Delica Lancets [id number] Use OneTouch Delica lancets to check blood sugar 3 times weekly, alternating between fasting and 2 hours after meals.   rosuvastatin (CRESTOR) 5 MG tablet Take 1 tablet (5 mg total) by mouth daily.   sacubitril-valsartan (ENTRESTO) 24-26 MG Take 1 tablet by mouth 2 (two) times daily.   spironolactone (ALDACTONE) 25 MG tablet TAKE 1/2 (ONE-HALF) TABLET BY MOUTH ONCE DAILY . APPOINTMENT REQUIRED FOR FUTURE REFILLS   vitamin C (ASCORBIC ACID) 500 MG tablet Take 2,000 mg by mouth as needed.    fluticasone (FLONASE) 50 MCG/ACT nasal spray Place 2 sprays into both nostrils daily. (Patient not taking: Reported on 04/26/2022)   [DISCONTINUED] amoxicillin-clavulanate (AUGMENTIN) 875-125 MG tablet  Take 1 tablet by mouth every 12 (twelve) hours.   [DISCONTINUED] PAXLOVID, 300/100, 20 x 150 MG & 10 x '100MG'$  TBPK Take by mouth.   No facility-administered encounter medications on file as of 04/26/2022.    Allergies (verified) Jardiance [empagliflozin], Atorvastatin, Pravastatin sodium, and Sulfa antibiotics   History: Past Medical History:  Diagnosis Date   Anxiety    Aortic stenosis, mild    echo 2017   Breast cancer ([place]) 1996   left breast   Carpal tunnel syndrome    Cataract    Chronic diastolic CHF (congestive heart  failure) (HCC)    NYHA class II   COVID-19    12/02/21   Diabetes mellitus without complication (HCC)    Hyperlipidemia    Hypertension    Left bundle branch block (LBBB)    Low back pain    Neuropathy    NICM (nonischemic cardiomyopathy) ([place])    EF 30%, cath 05/20/2015 clean coronary.  EF resolved by echo with EF 55%   OSA on CPAP    Prediabetes    Pulmonary HTN ([place]) 05/2015   Severe with PASP 67/15mHg - resolved on followup echo   PVC's (premature ventricular contractions) 11/04/2015   Sleep apnea    cpap   Past Surgical History:  Procedure Laterality Date   BREAST SURGERY     CARDIAC CATHETERIZATION N/A 05/20/2015   Procedure: Right/Left Heart Cath and Coronary Angiography;  Surgeon: TTroy Sine MD;  [facility];  Service: Cardiovascular;  Laterality: N/A;   CATARACT EXTRACTION W/ INTRAOCULAR LENS  IMPLANT, BILATERAL     COLONOSCOPY  2018   DILATION AND CURETTAGE OF UTERUS     lumpectomy left breast     POLYPECTOMY     UTERINE FIBROID SURGERY     Family History  Problem Relation Age of Onset   Arthritis Mother    Depression Mother    Hearing loss Mother    Hyperlipidemia Mother    Miscarriages / [place]Mother    Colon cancer Mother 80       2010   Colon polyps Mother    Alcohol abuse Father    Cancer Father        PROSTATE   Depression Father    Hypertension Father    Hyperlipidemia Father    Drug abuse Brother    Early death Maternal Grandmother    Cancer Paternal Grandmother    Heart attack Neg Hx    Diabetes Neg Hx    Esophageal cancer Neg Hx    Rectal cancer Neg Hx    Stomach cancer Neg Hx    Social History   Socioeconomic History   Marital status: Significant Other    Spouse name: Not on file   Number of children: Not on file   Years of education: Not on file   Highest education level: Not on file  Occupational History   Occupation: Truck DGeophysicist/field seismologist Tobacco Use   Smoking status: Former    Types: Cigarettes    Quit  date: 06/18/2009    Years since quitting: 12.8   Smokeless tobacco: Never  Vaping Use   Vaping Use: Never used  Substance and Sexual Activity   Alcohol use: Yes    Comment: once every 2 months   Drug use: No   Sexual activity: Yes  Other Topics Concern   Not on file  Social History Narrative   ** Merged History Encounter **  Social Determinants of Health   Financial Resource Strain: Low Risk  (04/26/2022)   Overall Financial Resource Strain (CARDIA)    Difficulty of Paying Living Expenses: Not hard at all  Food Insecurity: No Food Insecurity (04/26/2022)   Hunger Vital Sign    Worried About Running Out of Food in the Last Year: Never true    Ran Out of Food in the Last Year: Never true  Transportation Needs: No Transportation Needs (04/26/2022)   PRAPARE - Hydrologist (Medical): No    Lack of Transportation (Non-Medical): No  Physical Activity: Inactive (04/26/2022)   Exercise Vital Sign    Days of Exercise per Week: 0 days    Minutes of Exercise per Session: 0 min  Stress: Stress Concern Present (04/26/2022)   [place]    Feeling of Stress : To some extent  Social Connections: Socially Isolated (04/26/2022)   Social Connection and Isolation Panel [NHANES]    Frequency of Communication with Friends and Family: More than three times a week    Frequency of Social Gatherings with Friends and Family: More than three times a week    Attends Religious Services: Never    Marine scientist or Organizations: No    Attends Music therapist: Never    Marital Status: Never married    Tobacco Counseling Counseling given: Not Answered   Clinical Intake:  Pre-visit preparation completed: Yes  Pain : No/denies pain     BMI - recorded: 40.34 Nutritional Status: BMI > 30  Obese Nutritional Risks: None Diabetes: Yes CBG done?: No CBG resulted in Enter/ Edit  results?: No Did pt. bring in CBG monitor from home?: No  How often do you need to have someone help you when you read instructions, pamphlets, or other written materials from your doctor or pharmacy?: 1 - Never  Diabetic?Nutrition Risk Assessment:  Has the patient had any N/V/D within the last 2 months?  No  Does the patient have any non-healing wounds?  No  Has the patient had any unintentional weight loss or weight gain?  No   Diabetes:  Is the patient diabetic?  Yes  If diabetic, was a CBG obtained today?  No  Did the patient bring in their glucometer from home?  No  How often do you monitor your CBG's? Twice a day .   Financial Strains and Diabetes Management:  Are you having any financial strains with the device, your supplies or your medication? No .  Does the patient want to be seen by Chronic Care Management for management of their diabetes?  No  Would the patient like to be referred to a Nutritionist or for Diabetic Management?  No   Diabetic Exams:  Diabetic Eye Exam: Completed 06/14/21 Diabetic Foot Exam: Overdue, Pt has been advised about the importance in completing this exam. Pt is scheduled for diabetic foot exam on next appt .   Interpreter Needed?: No  Information entered by :: Charlott Rakes, LPN   Activities of Daily Living    04/26/2022    9:14 AM  In your present state of health, do you have any difficulty performing the following activities:  Hearing? 1  Comment wears hearing aids  Vision? 0  Difficulty concentrating or making decisions? 0  Walking or climbing stairs? 1  Comment knee issues one at a time  Dressing or bathing? 0  Doing errands, shopping? 0  Preparing Food and  eating ? N  Using the Toilet? N  In the past six months, have you accidently leaked urine? Y  Comment wears  a pad  Do you have problems with loss of bowel control? N  Managing your Medications? N  Managing your Finances? N  Housekeeping or managing your Housekeeping? N     Patient Care Team: [name], Randa Evens, PA-C as PCP - General (Physician Assistant) Sueanne Margarita, MD as PCP - Cardiology (Cardiology) Elayne Snare, MD as Consulting Physician (Endocrinology)  Indicate any recent Medical Services you may have received from other than [facility] providers in the past year (date may be approximate).     Assessment:   This is a routine wellness examination for [name].  Hearing/Vision screen Hearing Screening - Comments:: Pt wears hearing aids  Vision Screening - Comments:: Pt follows up with Dr Rex Kras for annual eye exams   Dietary issues and exercise activities discussed:     Goals Addressed             This Visit's Progress    Patient Stated       Weight loss        Depression Screen    04/26/2022    9:11 AM 08/10/2021    8:37 AM 01/27/2021   10:09 AM 11/15/2019   10:52 AM 08/23/2019    9:21 AM 09/27/2017    9:13 AM  PHQ 2/9 Scores  PHQ - 2 Score 2 0 0 1 0 0  PHQ- 9 Score 2    2 0    Fall Risk    04/26/2022    9:14 AM 12/28/2021    2:12 PM 08/10/2021    8:38 AM 04/15/2020   10:37 AM 11/15/2019   10:52 AM  Fall Risk   Falls in the past year? 0 0 0 1 0  Number falls in past yr: 0 0 0 0   Injury with Fall? 0 0 0 1   Risk for fall due to : Impaired mobility;Impaired vision;Impaired balance/gait No Fall Risks No Fall Risks    Follow up Falls prevention discussed Falls evaluation completed       FALL RISK PREVENTION PERTAINING TO THE HOME:  Any stairs in or around the home? Yes  If so, are there any without handrails? No  Home free of loose throw rugs in walkways, pet beds, electrical cords, etc? Yes  Adequate lighting in your home to reduce risk of falls? Yes   ASSISTIVE DEVICES UTILIZED TO PREVENT FALLS:  Life alert? No  Use of a cane, walker or w/c? No  Grab bars in the bathroom? Yes  Shower chair or bench in shower? No  Elevated toilet seat or a handicapped toilet? No   TIMED UP AND GO:  Was the test performed? No .    Cognitive Function:        04/26/2022    9:16 AM  6CIT Screen  What Year? 0 points  What month? 0 points  What time? 0 points  Count back from 20 0 points  Months in reverse 0 points  Repeat phrase 0 points  Total Score 0 points    Immunizations Immunization History  Administered Date(s) Administered   Fluad Quad(high Dose 65+) 12/14/2021   Influenza Split 11/25/2013   Influenza, High Dose Seasonal PF 12/25/2019   Influenza,inj,Quad PF,6+ Mos 03/12/2015, 01/24/2018, 11/08/2018   Influenza-Unspecified 01/04/2016, 10/21/2020   PFIZER(Purple Top)SARS-COV-2 Vaccination 04/19/2019, 05/15/2019, 12/27/2019   Pneumococcal Conjugate-13 03/21/2016   Pneumococcal Polysaccharide-23 05/23/2015, 08/10/2021  RSV,unspecified 11/15/2021   Zoster Recombinat (Shingrix) 09/27/2017, 01/24/2018    TDAP status: Due, Education has been provided regarding the importance of this vaccine. Advised may receive this vaccine at local pharmacy or Health Dept. Aware to provide a copy of the vaccination record if obtained from local pharmacy or Health Dept. Verbalized acceptance and understanding.  Flu Vaccine status: Up to date  Pneumococcal vaccine status: Up to date  Covid-19 vaccine status: Completed vaccines  Qualifies for Shingles Vaccine? Yes   Zostavax completed Yes   Shingrix Completed?: Yes  Screening Tests Health Maintenance  Topic Date Due   DTaP/Tdap/Td (1 - Tdap) Never done   COVID-19 Vaccine (4 - 2023-24 season) 10/15/2021   FOOT EXAM  12/02/2021   Diabetic kidney evaluation - eGFR measurement  04/21/2022   Diabetic kidney evaluation - Urine ACR  04/21/2022   HEMOGLOBIN A1C  04/21/2022   DEXA SCAN  12/29/2022 (Originally 12/22/2019)   OPHTHALMOLOGY EXAM  06/15/2022   COLONOSCOPY (Pts 45-66yr Insurance coverage will need to be confirmed)  10/16/2022   MAMMOGRAM  04/20/2023   Medicare Annual Wellness (AWV)  04/26/2023   Pneumonia Vaccine 67 Years old  Completed   INFLUENZA  VACCINE  Completed   Hepatitis C Screening  Completed   Zoster Vaccines- Shingrix  Completed   HPV VACCINES  Aged Out    Health Maintenance  Health Maintenance Due  Topic Date Due   DTaP/Tdap/Td (1 - Tdap) Never done   COVID-19 Vaccine (4 - 2023-24 season) 10/15/2021   FOOT EXAM  12/02/2021   Diabetic kidney evaluation - eGFR measurement  04/21/2022   Diabetic kidney evaluation - Urine ACR  04/21/2022   HEMOGLOBIN A1C  04/21/2022    Colorectal cancer screening: Type of screening: Colonoscopy. Completed 10/16/19. Repeat every 3 years  Mammogram status: Completed 04/20/22. Repeat every year  Bone Density status: Ordered 10/14/21. Pt provided with contact info and advised to call to schedule appt.  Additional Screening:  Hepatitis C Screening:  Completed 03/12/15  Vision Screening: Recommended annual ophthalmology exams for early detection of glaucoma and other disorders of the eye. Is the patient up to date with their annual eye exam?  Yes  Who is the provider or what is the name of the office in which the patient attends annual eye exams? Dr TRex Kras If pt is not established with a provider, would they like to be referred to a provider to establish care? mo.   Dental Screening: Recommended annual dental exams for proper oral hygiene  Community Resource Referral / Chronic Care Management: CRR required this visit?  No   CCM required this visit?  No      Plan:     I have personally reviewed and noted the following in the patient's chart:   Medical and social history Use of alcohol, tobacco or illicit drugs  Current medications and supplements including opioid prescriptions. Patient is not currently taking opioid prescriptions. Functional ability and status Nutritional status Physical activity Advanced directives List of other physicians Hospitalizations, surgeries, and ER visits in previous 12 months Vitals Screenings to include cognitive, depression, and  falls Referrals and appointments  In addition, I have reviewed and discussed with patient certain preventive protocols, quality metrics, and best practice recommendations. A written personalized care plan for preventive services as well as general preventive health recommendations were provided to patient.     TWillette Brace LPN   [id number]  Nurse Notes: none

## 2022-05-20 ENCOUNTER — Ambulatory Visit: Payer: Medicare Other | Admitting: Endocrinology

## 2022-05-20 ENCOUNTER — Encounter: Payer: Self-pay | Admitting: Endocrinology

## 2022-05-20 VITALS — BP 122/78 | HR 73 | Ht 64.0 in | Wt 223.0 lb

## 2022-05-20 DIAGNOSIS — E1169 Type 2 diabetes mellitus with other specified complication: Secondary | ICD-10-CM

## 2022-05-20 DIAGNOSIS — Z7984 Long term (current) use of oral hypoglycemic drugs: Secondary | ICD-10-CM

## 2022-05-20 DIAGNOSIS — E782 Mixed hyperlipidemia: Secondary | ICD-10-CM | POA: Diagnosis not present

## 2022-05-20 LAB — COMPREHENSIVE METABOLIC PANEL
ALT: 21 U/L (ref 0–35)
AST: 18 U/L (ref 0–37)
Albumin: 4.3 g/dL (ref 3.5–5.2)
Alkaline Phosphatase: 50 U/L (ref 39–117)
BUN: 16 mg/dL (ref 6–23)
CO2: 25 mEq/L (ref 19–32)
Calcium: 9.6 mg/dL (ref 8.4–10.5)
Chloride: 108 mEq/L (ref 96–112)
Creatinine, Ser: 0.64 mg/dL (ref 0.40–1.20)
GFR: 91.45 mL/min (ref >60.00)
Glucose, Bld: 100 mg/dL — ABNORMAL HIGH (ref 70–99)
Potassium: 3.7 mEq/L (ref 3.5–5.1)
Sodium: 140 mEq/L (ref 135–145)
Total Bilirubin: 0.3 mg/dL (ref 0.2–1.2)
Total Protein: 7 g/dL (ref 6.0–8.3)

## 2022-05-20 LAB — LIPID PANEL
Cholesterol: 144 mg/dL (ref 0–200)
HDL: 64.2 mg/dL (ref >39.00)
LDL Cholesterol: 65 mg/dL (ref 0–99)
NonHDL: 80.17
Total CHOL/HDL Ratio: 2
Triglycerides: 77 mg/dL (ref 0.0–149.0)
VLDL: 15.4 mg/dL (ref 0.0–40.0)

## 2022-05-20 LAB — POCT GLYCOSYLATED HEMOGLOBIN (HGB A1C): Hemoglobin A1C: 5.8 % — AB (ref 4.0–5.6)

## 2022-05-20 LAB — MICROALBUMIN / CREATININE URINE RATIO
Creatinine,U: 91.6 mg/dL
Microalb Creat Ratio: 0.8 mg/g (ref 0.0–30.0)
Microalb, Ur: <0.7 mg/dL (ref 0.0–1.9)

## 2022-05-20 MED ORDER — METFORMIN HCL ER 500 MG PO TB24: ORAL_TABLET | ORAL | 3 refills | Status: DC

## 2022-05-20 NOTE — Progress Notes (Unsigned)
Patient ID: [name], female   DOB: 03/17/1955, 67 y.o.   MRN: [id number]           Reason for Appointment: Follow-up for Type 2 Diabetes   History of Present Illness:          Date of diagnosis of type 2 diabetes mellitus: 09/2017       Background history:   Her diabetes was diagnosed with an A1c of 6.8 Review of her records indicate that probably that her highest fasting blood sugar has been only 130 outside the hospital settings  Recent history:   Most recent A1c is 6.1 and about the same consistently Previous range 6.4-7.1 overall  Fructosamine is last 219  Non-insulin hypoglycemic drugs the patient is taking are: Metformin ER 500 mg 3 tablets daily  Current management, blood sugar patterns and problems identified:  She is doing fairly well and trying to stick with her diet although occasionally will have sweets She is quite consistent with trying to check her sugars but mostly in the mornings And again she is limited in her exercise ability but has still lost 4 pounds She tries to take her metformin with one of her meals and occasionally may only get a chance to eat 1 meal a day No side effects from this Blood sugars after meals are excellent with highest reading only 128 late evening Salads  Last consultation with dietitian: 10/21       Side effects from medications have been: Vaginitis from Jardiance               Exercise:  minimal  Glucose monitoring:  Using One Touch Verio, last 30-day data:   PRE-MEAL Fasting Lunch Dinner Bedtime Overall  Glucose range:     82-124  Mean/median:     106   POST-MEAL PC Breakfast PC Lunch PC Dinner  Glucose range:     Mean/median:       PRE-MEAL Fasting Lunch Dinner Bedtime Overall  Glucose range: 79-122    79-128  Mean/median: 104       POST-MEAL PC Breakfast PC Lunch PC Dinner  Glucose range:   104-119  Mean/median:        Weight history: Highest 262   Wt Readings from Last 3 Encounters:  05/20/22 223 lb  (101.2 kg)  04/26/22 235 lb (106.6 kg)  01/29/22 245 lb (111.1 kg)    Glycemic control:   Lab Results  Component Value Date   HGBA1C 5.8 (A) 05/20/2022   HGBA1C 6.1 (A) 10/21/2021   HGBA1C 6.1 (A) 04/20/2021   Lab Results  Component Value Date   MICROALBUR 1.6 04/20/2021   LDLCALC 62 04/20/2021   CREATININE 0.69 04/20/2021   Lab Results  Component Value Date   MICRALBCREAT 0.7 04/20/2021    Lab Results  Component Value Date   FRUCTOSAMINE 219 12/23/2019      Allergies as of 05/20/2022       Reactions   Jardiance [empagliflozin] Anaphylaxis, Itching, Rash, Other (See Comments)   Yeast infection   Atorvastatin Other (See Comments)   REACTION: mylagias   Pravastatin Sodium Other (See Comments)   REACTION: myalgias   Sulfa Antibiotics Hives        Medication List        Accurate as of May 20, 2022 10:57 AM. If you have any questions, ask your nurse or doctor.          acetaminophen 650 MG CR tablet Commonly known as: TYLENOL Take 1,300  mg by mouth as needed for pain.   ascorbic acid 500 MG tablet Commonly known as: VITAMIN C Take 2,000 mg by mouth as needed.   aspirin 81 MG tablet Take 81 mg by mouth daily.   BENADRYL ALLERGY PO Take by mouth.   carvedilol 6.25 MG tablet Commonly known as: COREG Take 1 tablet (6.25 mg total) by mouth 2 (two) times daily with a meal.   cetirizine 10 MG tablet Commonly known as: ZYRTEC Take 1 tablet (10 mg total) by mouth daily.   clotrimazole 1 % cream Commonly known as: LOTRIMIN Apply 1 Application topically 2 (two) times daily.   diclofenac Sodium 1 % Gel Commonly known as: VOLTAREN Apply topically 4 (four) times daily. Arthritis   Entresto 24-26 MG Generic drug: sacubitril-valsartan Take 1 tablet by mouth 2 (two) times daily.   ezetimibe 10 MG tablet Commonly known as: ZETIA Take 1 tablet (10 mg total) by mouth daily.   fluticasone 50 MCG/ACT nasal spray Commonly known as: FLONASE Place 2  sprays into both nostrils daily.   furosemide 40 MG tablet Commonly known as: LASIX TAKE 1 TO 2 TABLETS BY MOUTH ONCE DAILY AS DIRECTED   gabapentin 100 MG capsule Commonly known as: NEURONTIN Take 1 capsule (100 mg total) by mouth 3 (three) times daily as needed.   metFORMIN 500 MG 24 hr tablet Commonly known as: GLUCOPHAGE-XR TAKE 3 TABLETS BY MOUTH ONCE DAILY WITH SUPPER   multivitamin tablet Take 1 tablet by mouth daily.   OneTouch Delica Lancets 30G Misc Use OneTouch Delica lancets to check blood sugar 3 times weekly, alternating between fasting and 2 hours after meals.   OneTouch Verio Flex System w/Device Kit Use onetouch verio flex meter to check blood sugar 3 times weekly, alternating between fasting and 2 hours after meals.   OneTouch Verio test strip Generic drug: glucose blood USE 1 STRIP TO CHECK BLOOD SUGAR TWO TO THREE TIMES A DAY   rosuvastatin 5 MG tablet Commonly known as: CRESTOR Take 1 tablet (5 mg total) by mouth daily.   spironolactone 25 MG tablet Commonly known as: ALDACTONE TAKE 1/2 (ONE-HALF) TABLET BY MOUTH ONCE DAILY . APPOINTMENT REQUIRED FOR FUTURE REFILLS        Allergies:  Allergies  Allergen Reactions   Jardiance [Empagliflozin] Anaphylaxis, Itching, Rash and Other (See Comments)    Yeast infection   Atorvastatin Other (See Comments)    REACTION: mylagias   Pravastatin Sodium Other (See Comments)    REACTION: myalgias   Sulfa Antibiotics Hives    Past Medical History:  Diagnosis Date   Anxiety    Aortic stenosis, mild    echo 2017   Breast cancer 1996   left breast   Carpal tunnel syndrome    Cataract    Chronic diastolic CHF (congestive heart failure)    NYHA class II   COVID-19    12/02/21   Diabetes mellitus without complication    Hyperlipidemia    Hypertension    Left bundle branch block (LBBB)    Low back pain    Neuropathy    NICM (nonischemic cardiomyopathy)    EF 30%, cath 05/20/2015 clean coronary.  EF  resolved by echo with EF 55%   OSA on CPAP    Prediabetes    Pulmonary HTN 05/2015   Severe with PASP 67/72mmHg - resolved on followup echo   PVC's (premature ventricular contractions) 11/04/2015   Sleep apnea    cpap    Past Surgical History:  Procedure Laterality Date   BREAST SURGERY     CARDIAC CATHETERIZATION N/A 05/20/2015   Procedure: Right/Left Heart Cath and Coronary Angiography;  Surgeon: Lennette Bihari, MD;  Location: [facility] INVASIVE CV LAB;  Service: Cardiovascular;  Laterality: N/A;   CATARACT EXTRACTION W/ INTRAOCULAR LENS  IMPLANT, BILATERAL     COLONOSCOPY  2018   DILATION AND CURETTAGE OF UTERUS     lumpectomy left breast     POLYPECTOMY     UTERINE FIBROID SURGERY      Family History  Problem Relation Age of Onset   Arthritis Mother    Depression Mother    Hearing loss Mother    Hyperlipidemia Mother    Miscarriages / [place] Mother    Colon cancer Mother 67       2010   Colon polyps Mother    Alcohol abuse Father    Cancer Father        PROSTATE   Depression Father    Hypertension Father    Hyperlipidemia Father    Drug abuse Brother    Early death Maternal Grandmother    Cancer Paternal Grandmother    Heart attack Neg Hx    Diabetes Neg Hx    Esophageal cancer Neg Hx    Rectal cancer Neg Hx    Stomach cancer Neg Hx     Social History:  reports that she quit smoking about 12 years ago. Her smoking use included cigarettes. She has never used smokeless tobacco. She reports current alcohol use. She reports that she does not use drugs.   Review of Systems  Constitutional:  Negative for weight loss.     Lipid history: She has been treated with Crestor and Zetia by her PCP with the following results    Lab Results  Component Value Date   CHOL 154 04/20/2021   HDL 61.40 04/20/2021   LDLCALC 62 04/20/2021   TRIG 155.0 (H) 04/20/2021   CHOLHDL 3 04/20/2021        CARDIAC: Last ejection fraction on echo 55%     Hypertension: Has been  treated with low-dose Coreg and spironolactone 25 mg as well as Entresto Blood pressure history:  BP Readings from Last 3 Encounters:  05/20/22 122/78  01/29/22 (!) 141/76  12/28/21 106/69   Lab Results  Component Value Date   K 3.8 04/20/2021     Most recent foot exam: 10/22 No symptoms of numbness but occasionally may have some tingling in her lower legs  She had COVID infection diagnosed on 02/17/2020  LABS:  Office Visit on 05/20/2022  Component Date Value Ref Range Status   Hemoglobin A1C 05/20/2022 5.8 (A)  4.0 - 5.6 % Final    Physical Examination:  BP 122/78   Pulse 73   Ht 5\' 4"  (1.626 m)   Wt 223 lb (101.2 kg)   SpO2 96%   BMI 38.28 kg/m   Diabetic Foot Exam - Simple   Simple Foot Form Diabetic Foot exam was performed with the following findings: Yes   Visual Inspection No deformities, no ulcerations, no other skin breakdown bilaterally: Yes Sensation Testing Intact to touch and monofilament testing bilaterally: Yes Pulse Check Posterior Tibialis and Dorsalis pulse intact bilaterally: Yes See comments: Yes Comments Reduced PT pulses        ASSESSMENT:  Diabetes type 2 with BMI <40  See history of present illness for detailed discussion of current diabetes assessment and problems identified  A1c is stable at 5.8  Current  treatment is metformin ER 1500 mg daily  She has had consistent control of her diabetes She is doing well with diet and able to maintain her weight slightly lower Although she may be a candidate for GLP-1 she is very reluctant to consider injections  History of hypercholesterolemia: Well-controlled  PLAN:    No change in 1500 mg metformin She can cut back on checking her sugars in the morning Encouraged her to be as active as possible To call if blood sugars are starting to rise more than usual  Discussed continuing medications for blood pressure and lipids and follow-up with PCP Reassured her that she is doing all  possible measures to reduce cardiovascular risk  Follow-up in 6 months unless follow-up needed earlier   There are no Patient Instructions on file for this visit.     Reather Littler 05/20/2022, 10:57 AM   Note: This office note was prepared with Dragon voice recognition system technology. Any transcriptional errors that result from this process are unintentional.  Addendum: Urine microalbumin, lipids normal, glucose 143

## 2022-05-23 ENCOUNTER — Telehealth: Payer: Self-pay | Admitting: Nutrition

## 2022-05-23 NOTE — Telephone Encounter (Signed)
-----   Message from Reather Littler, MD sent at 05/21/2022  5:30 PM EDT ----- Please call to let patient know that the lab results are normal and no further action needed

## 2022-05-23 NOTE — Telephone Encounter (Signed)
LVM that lab work is normal an no other action is needed.

## 2022-06-01 DIAGNOSIS — G4733 Obstructive sleep apnea (adult) (pediatric): Secondary | ICD-10-CM | POA: Diagnosis not present

## 2022-07-12 IMAGING — CR DG SHOULDER 2+V*L*
3 series · 3 of 3 positions shown · non-contrast
Comparison: [DATE] [DATE], [DATE], [DATE] [DATE] [DATE]

CLINICAL DATA: LEFT shoulder pain

EXAM:
LEFT SHOULDER - 2+ VIEW

[w shoulder grashey left]
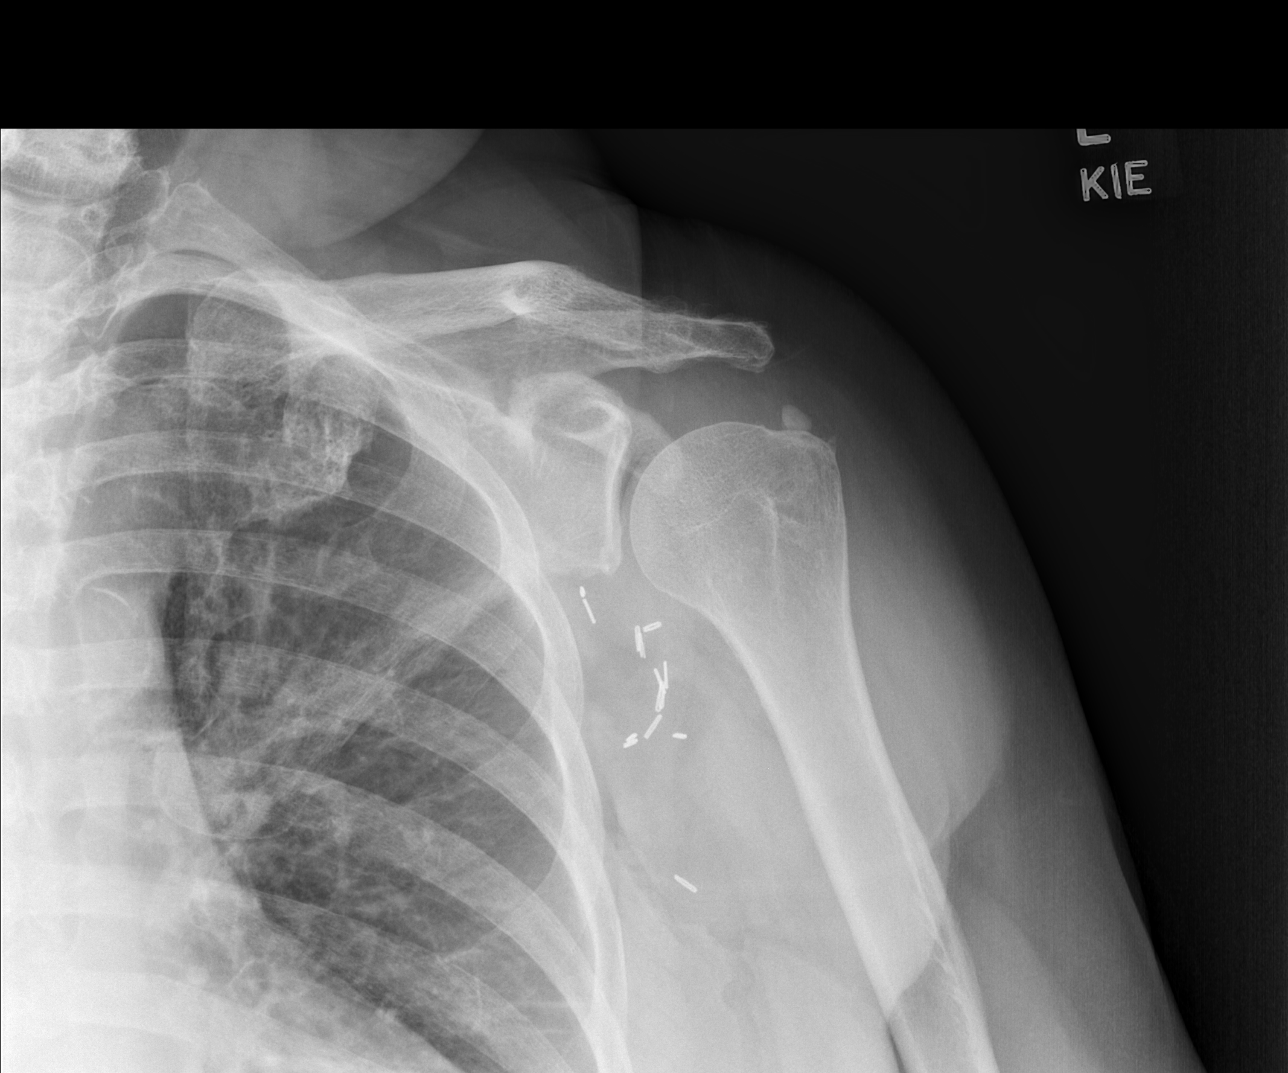

[w shoulder y view left]
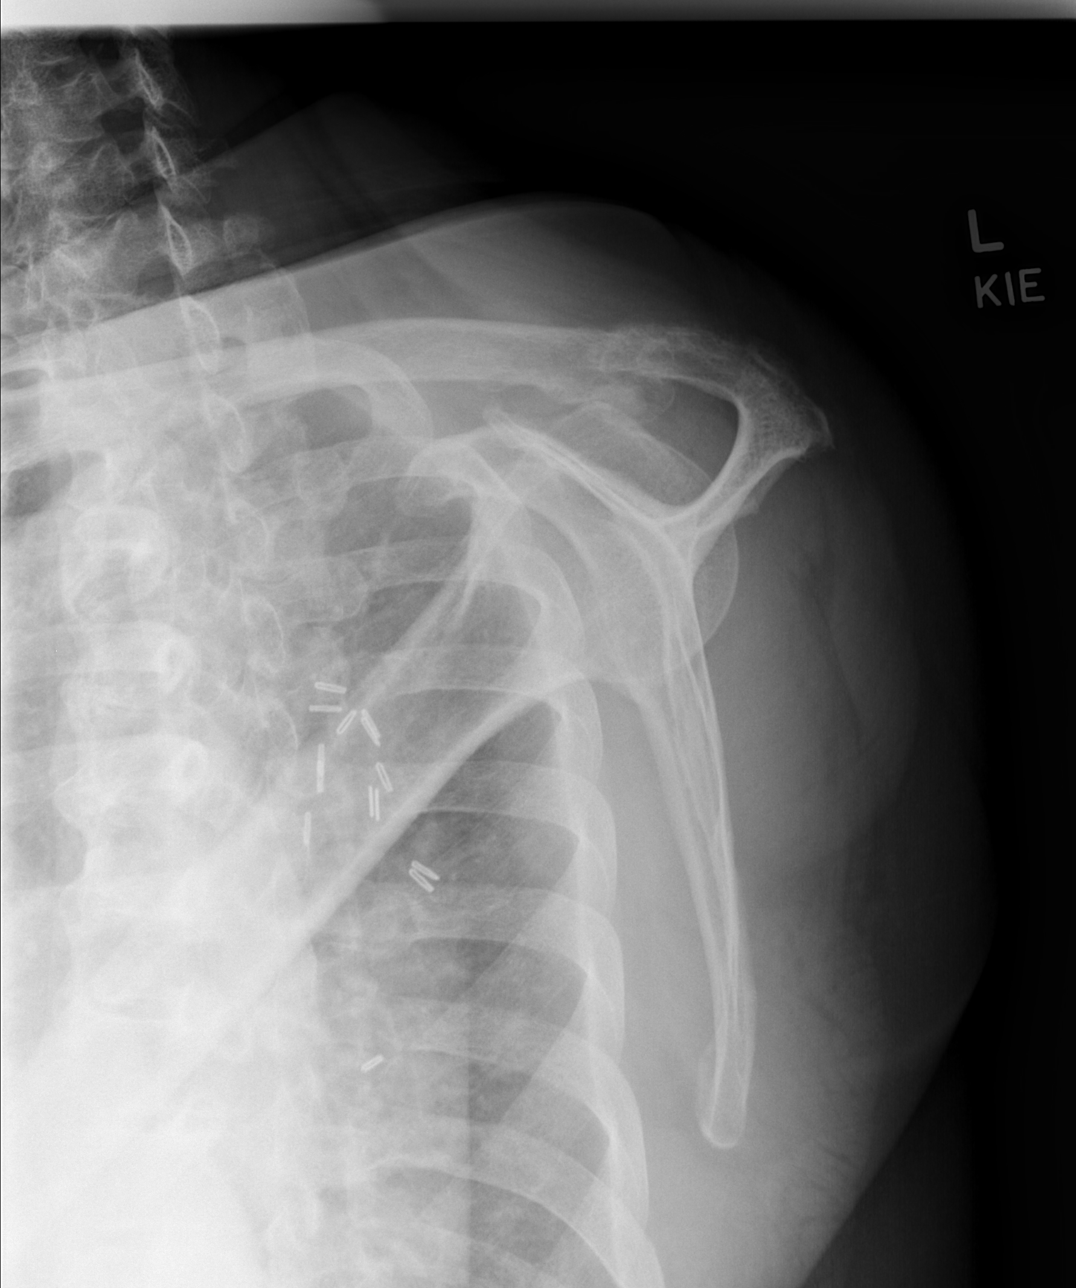

[w shoulder ap external left]
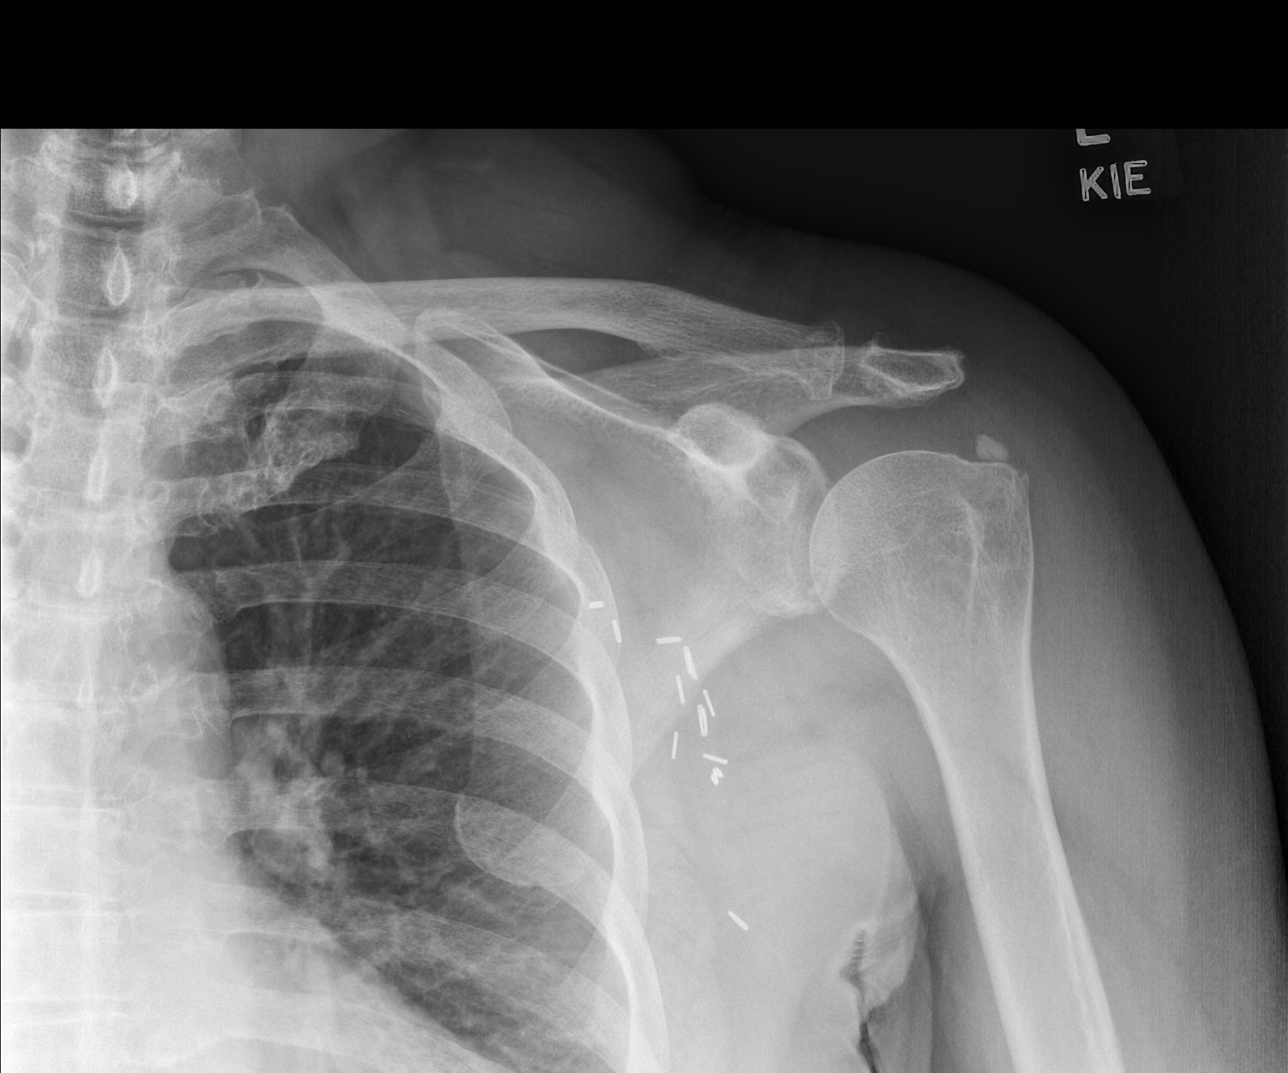

[3 of 3 positions shown; findings below may reference images not displayed]

FINDINGS: No acute fracture or dislocation. Moderate degenerative changes of
the acromioclavicular joint with subacromial spurring. No area of
erosion or osseous destruction. No unexpected radiopaque foreign
body. Surgical clips over the LEFT axilla. Atherosclerotic
calcifications of the aorta. Globular calcific density overlying the
region of the rotator cuff insertion may reflect underlying calcific
tendinitis.
IMPRESSION: 1. No acute osseous abnormality.
2. Moderate degenerative changes of the left AC joint.
3. Globular calcific density overlying the rotator cuff insertion
may reflect underlying calcific tendinitis in the appropriate
clinical setting.
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
## 2022-07-19 DIAGNOSIS — H524 Presbyopia: Secondary | ICD-10-CM | POA: Diagnosis not present

## 2022-07-19 DIAGNOSIS — H52203 Unspecified astigmatism, bilateral: Secondary | ICD-10-CM | POA: Diagnosis not present

## 2022-07-19 DIAGNOSIS — H43393 Other vitreous opacities, bilateral: Secondary | ICD-10-CM | POA: Diagnosis not present

## 2022-07-19 DIAGNOSIS — Z7984 Long term (current) use of oral hypoglycemic drugs: Secondary | ICD-10-CM | POA: Diagnosis not present

## 2022-07-19 DIAGNOSIS — E119 Type 2 diabetes mellitus without complications: Secondary | ICD-10-CM | POA: Diagnosis not present

## 2022-07-19 DIAGNOSIS — H04123 Dry eye syndrome of bilateral lacrimal glands: Secondary | ICD-10-CM | POA: Diagnosis not present

## 2022-07-19 DIAGNOSIS — H43813 Vitreous degeneration, bilateral: Secondary | ICD-10-CM | POA: Diagnosis not present

## 2022-07-19 DIAGNOSIS — Z961 Presence of intraocular lens: Secondary | ICD-10-CM | POA: Diagnosis not present

## 2022-07-19 DIAGNOSIS — H5212 Myopia, left eye: Secondary | ICD-10-CM | POA: Diagnosis not present

## 2022-07-19 LAB — HM DIABETES EYE EXAM

## 2022-08-12 DIAGNOSIS — G4733 Obstructive sleep apnea (adult) (pediatric): Secondary | ICD-10-CM | POA: Diagnosis not present

## 2022-08-16 ENCOUNTER — Telehealth: Payer: Self-pay | Admitting: Cardiology

## 2022-08-16 MED ORDER — EZETIMIBE 10 MG PO TABS: 10.0000 mg | ORAL_TABLET | Freq: Every day | ORAL | 0 refills | Status: DC

## 2022-08-16 NOTE — Telephone Encounter (Signed)
*  STAT* If patient is at the pharmacy, call can be transferred to refill team.   1. Which medications need to be refilled? (please list name of each medication and dose if known) ezetimibe (ZETIA) 10 MG tablet   2. Which pharmacy/location (including street and city if local pharmacy) is medication to be sent to? [id number] -  ([place]), [place] - [address]    3. Do they need a 30 day or 90 day supply? 90 day

## 2022-08-16 NOTE — Progress Notes (Unsigned)
Cardiology Office Note:  .   Date:  08/16/2022  ID:  [name], DOB 04/29/1955, MRN [id number] PCP: [name], Crist Infante, PA-C  [facility] Providers Cardiologist:  Armanda Magic, MD {  History of Present Illness: .   [name] is a 67 y.o. female with a history of chronic combined CHF, mild AS, HTN, LBBB, obesity and obstructive sleep apnea on CPAP seen in follow-up appointment.  Stress test in 2015 with inferior and anterior wall defects and normal coronary arteries by cath, EF 30 to 35% and severe pulmonary hypertension and NSVT noted on telemetry at the time of hospitalization for cath.  Had 2D echocardiogram in August 2018 which showed mildly reduced LVEF 40 to 45% with diffuse HK.  Mild AS.  Mean AV gradient was 13 mmHg.  Has a history of PACs and PVCs by event monitor and chronic lower extremity edema.  She lives a sedentary lifestyle.  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  Last echo 08/2020 with LVEF 40 to 45%, grade 2 DD, normal RV function, mild to moderate AAS with mean gradient of 11 mmHg.  Last appointment 08/10/2021.  At that time no regular exercise.  Mostly plays games.  Active only when at work by lifting luggage going up and down stairs.  Patient denied chest pain, shortness of breath, orthopnea, PND, syncope, lower extremity edema or melena.  Uses low salt diet.  Today, she***  ROS: ***  Studies Reviewed: .        Echo 08/2020 1. Diffuse hypokinesis worse in the inferior wall . Left ventricular  ejection fraction, by estimation, is 40 to 45%. The left ventricle has  mildly decreased function. The left ventricle has no regional wall motion  abnormalities. The left ventricular  internal cavity size was moderately dilated. Left ventricular diastolic  parameters are consistent with Grade II diastolic dysfunction  (pseudonormalization). Elevated left ventricular end-diastolic pressure.   2. Right ventricular  systolic function is normal. The right ventricular  size is normal. There is normal pulmonary artery systolic pressure.   3. Left atrial size was moderately dilated.   4. The mitral valve is degenerative. Trivial mitral valve regurgitation.  No evidence of mitral stenosis. Moderate mitral annular calcification.   5. Mild to moderate appearing low flow AS with AVA 1.25 cm2 DVI 0.4  gradients actually slightly lower than TTE done 09/04/19 at similar EF .  The aortic valve is tricuspid. There is moderate calcification of the  aortic valve. There is moderate thickening  of the aortic valve. Aortic valve regurgitation is not visualized. Mild to  moderate aortic valve stenosis.   6. The inferior vena cava is normal in size with greater than 50%  respiratory variability, suggesting right atrial pressure of 3 mmHg.    Right/Left Heart Cath and Coronary Angiography  05/2015    Conclusion   Significant right heart pressure elevation with severe pulmonary hypertension.   Normal coronary arteries.   Probable nonischemic cardiomyopathy.   RECOMMENDATION: The patient will be admitted to the TCU for close observation and IV diuresis.  A 2-D echo Doppler study will be done to assess left ventricular systolic and diastolic function.  Medication titration will be necessary. Risk Assessment/Calculations:   {Does this patient have ATRIAL FIBRILLATION?:[phone]} No BP recorded.  {Refresh Note OR Click here to enter BP  :1}***       Physical Exam:   VS:  There were no vitals  taken for this visit.   Wt Readings from Last 3 Encounters:  05/20/22 223 lb (101.2 kg)  04/26/22 235 lb (106.6 kg)  01/29/22 245 lb (111.1 kg)    GEN: Well nourished, well developed in no acute distress NECK: No JVD; No carotid bruits CARDIAC: ***RRR, no murmurs, rubs, gallops RESPIRATORY:  Clear to auscultation without rales, wheezing or rhonchi  ABDOMEN: Soft, non-tender, non-distended EXTREMITIES:  No edema; No deformity    ASSESSMENT AND PLAN: .   1.  Chronic combined CHF 2.  Mild to moderate AAS 3.  HTN 4.  OSA on CPAP    {Are you ordering a CV Procedure (e.g. stress test, cath, DCCV, TEE, etc)?   Press F2        :[id number]}  Dispo: ***  Signed, Sharlene Dory, PA-C

## 2022-08-16 NOTE — Telephone Encounter (Signed)
Pt's medication was sent to pt's pharmacy as requested. Confirmation received.  °

## 2022-08-17 ENCOUNTER — Other Ambulatory Visit: Payer: Self-pay | Admitting: Family Medicine

## 2022-08-17 ENCOUNTER — Encounter: Payer: Self-pay | Admitting: Physician Assistant

## 2022-08-17 ENCOUNTER — Ambulatory Visit: Payer: Medicare Other | Attending: Physician Assistant | Admitting: Physician Assistant

## 2022-08-17 VITALS — BP 102/74 | HR 70 | Ht 64.0 in | Wt 226.2 lb

## 2022-08-17 DIAGNOSIS — G4733 Obstructive sleep apnea (adult) (pediatric): Secondary | ICD-10-CM | POA: Diagnosis not present

## 2022-08-17 DIAGNOSIS — I5042 Chronic combined systolic (congestive) and diastolic (congestive) heart failure: Secondary | ICD-10-CM | POA: Diagnosis not present

## 2022-08-17 DIAGNOSIS — I10 Essential (primary) hypertension: Secondary | ICD-10-CM | POA: Diagnosis not present

## 2022-08-17 DIAGNOSIS — I350 Nonrheumatic aortic (valve) stenosis: Secondary | ICD-10-CM | POA: Diagnosis not present

## 2022-08-17 NOTE — Patient Instructions (Signed)
Medication Instructions:  Your physician recommends that you continue on your current medications as directed. Please refer to the Current Medication list given to you today.  *If you need a refill on your cardiac medications before your next appointment, please call your pharmacy*  Lab Work: None ordered If you have labs (blood work) drawn today and your tests are completely normal, you will receive your results only by: MyChart Message (if you have MyChart) OR A paper copy in the mail If you have any lab test that is abnormal or we need to change your treatment, we will call you to review the results.  Testing/Procedures: Your physician has requested that you have an echocardiogram. Echocardiography is a painless test that uses sound waves to create images of your heart. It provides your doctor with information about the size and shape of your heart and how well your heart's chambers and valves are working. This procedure takes approximately one hour. There are no restrictions for this procedure. Please do NOT wear cologne, perfume, aftershave, or lotions (deodorant is allowed). Please arrive 15 minutes prior to your appointment time.   Follow-Up: At [facility], you and your health needs are our priority.  As part of our continuing mission to provide you with exceptional heart care, we have created designated Provider Care Teams.  These Care Teams include your primary Cardiologist (physician) and Advanced Practice Providers (APPs -  Physician Assistants and Nurse Practitioners) who all work together to provide you with the care you need, when you need it.  Your next appointment:   6-8 month(s)  Provider:   Armanda Magic, MD   Low-Sodium Eating Plan Salt (sodium) helps you keep a healthy balance of fluids in your body. Too much sodium can raise your blood pressure. It can also cause fluid and waste to be held in your body. Your health care provider or dietitian may recommend a  low-sodium eating plan if you have high blood pressure (hypertension), kidney disease, liver disease, or heart failure. Eating less sodium can help lower your blood pressure and reduce swelling. It can also protect your heart, liver, and kidneys. What are tips for following this plan? Reading food labels  Check food labels for the amount of sodium per serving. If you eat more than one serving, you must multiply the listed amount by the number of servings. Choose foods with less than 140 milligrams (mg) of sodium per serving. Avoid foods with 300 mg of sodium or more per serving. Always check how much sodium is in a product, even if the label says "unsalted" or "no salt added." Shopping  Buy products labeled as "low-sodium" or "no salt added." Buy fresh foods. Avoid canned foods and pre-made or frozen meals. Avoid canned, cured, or processed meats. Buy breads that have less than 80 mg of sodium per slice. Cooking  Eat more home-cooked food. Try to eat less restaurant, buffet, and fast food. Try not to add salt when you cook. Use salt-free seasonings or herbs instead of table salt or sea salt. Check with your provider or pharmacist before using salt substitutes. Cook with plant-based oils, such as canola, sunflower, or olive oil. Meal planning When eating at a restaurant, ask if your food can be made with less salt or no salt. Avoid dishes labeled as brined, pickled, cured, or smoked. Avoid dishes made with soy sauce, miso, or teriyaki sauce. Avoid foods that have monosodium glutamate (MSG) in them. MSG may be added to some restaurant food, sauces,  soups, bouillon, and canned foods. Make meals that can be grilled, baked, poached, roasted, or steamed. These are often made with less sodium. General information Try to limit your sodium intake to 1,500-2,300 mg each day, or the amount told by your provider. What foods should I eat? Fruits Fresh, frozen, or canned fruit. Fruit  juice. Vegetables Fresh or frozen vegetables. "No salt added" canned vegetables. "No salt added" tomato sauce and paste. Low-sodium or reduced-sodium tomato and vegetable juice. Grains Low-sodium cereals, such as oats, puffed wheat and rice, and shredded wheat. Low-sodium crackers. Unsalted rice. Unsalted pasta. Low-sodium bread. Whole grain breads and whole grain pasta. Meats and other proteins Fresh or frozen meat, poultry, seafood, and fish. These should have no added salt. Low-sodium canned tuna and salmon. Unsalted nuts. Dried peas, beans, and lentils without added salt. Unsalted canned beans. Eggs. Unsalted nut butters. Dairy Milk. Soy milk. Cheese that is naturally low in sodium, such as ricotta cheese, fresh mozzarella, or Swiss cheese. Low-sodium or reduced-sodium cheese. Cream cheese. Yogurt. Seasonings and condiments Fresh and dried herbs and spices. Salt-free seasonings. Low-sodium mustard and ketchup. Sodium-free salad dressing. Sodium-free light mayonnaise. Fresh or refrigerated horseradish. Lemon juice. Vinegar. Other foods Homemade, reduced-sodium, or low-sodium soups. Unsalted popcorn and pretzels. Low-salt or salt-free chips. The items listed above may not be all the foods and drinks you can have. Talk to a dietitian to learn more. What foods should I avoid? Vegetables Sauerkraut, pickled vegetables, and relishes. Olives. [place] fries. Onion rings. Regular canned vegetables, except low-sodium or reduced-sodium items. Regular canned tomato sauce and paste. Regular tomato and vegetable juice. Frozen vegetables in sauces. Grains Instant hot cereals. Bread stuffing, pancake, and biscuit mixes. Croutons. Seasoned rice or pasta mixes. Noodle soup cups. Boxed or frozen macaroni and cheese. Regular salted crackers. Self-rising flour. Meats and other proteins Meat or fish that is salted, canned, smoked, spiced, or pickled. Precooked or cured meat, such as sausages or meat loaves. Tomasa Blase.  Ham. Pepperoni. Hot dogs. Corned beef. Chipped beef. Salt pork. Jerky. Pickled herring, anchovies, and sardines. Regular canned tuna. Salted nuts. Dairy Processed cheese and cheese spreads. Hard cheeses. Cheese curds. Blue cheese. Feta cheese. String cheese. Regular cottage cheese. Buttermilk. Canned milk. Fats and oils Salted butter. Regular margarine. Ghee. Bacon fat. Seasonings and condiments Onion salt, garlic salt, seasoned salt, table salt, and sea salt. Canned and packaged gravies. Worcestershire sauce. Tartar sauce. Barbecue sauce. Teriyaki sauce. Soy sauce, including reduced-sodium soy sauce. Steak sauce. Fish sauce. Oyster sauce. Cocktail sauce. Horseradish that you find on the shelf. Regular ketchup and mustard. Meat flavorings and tenderizers. Bouillon cubes. Hot sauce. Pre-made or packaged marinades. Pre-made or packaged taco seasonings. Relishes. Regular salad dressings. Salsa. Other foods Salted popcorn and pretzels. Corn chips and puffs. Potato and tortilla chips. Canned or dried soups. Pizza. Frozen entrees and pot pies. The items listed above may not be all the foods and drinks you should avoid. Talk to a dietitian to learn more. This information is not intended to replace advice given to you by your health care provider. Make sure you discuss any questions you have with your health care provider. Document Revised: 02/17/2022 Document Reviewed: 02/17/2022 Elsevier Patient Education  2024 Elsevier Inc[employer]  Heart-Healthy Eating Plan Many factors influence your heart health, including eating and exercise habits. Heart health is also called coronary health. Coronary risk increases with abnormal blood fat (lipid) levels. A heart-healthy eating plan includes limiting unhealthy fats, increasing healthy fats, limiting salt (sodium) intake, and making other  diet and lifestyle changes. What is my plan? Your health care provider may recommend that: You limit your fat intake to _________% or  less of your total calories each day. You limit your saturated fat intake to _________% or less of your total calories each day. You limit the amount of cholesterol in your diet to less than _________ mg per day. You limit the amount of sodium in your diet to less than _________ mg per day. What are tips for following this plan? Cooking Cook foods using methods other than frying. Baking, boiling, grilling, and broiling are all good options. Other ways to reduce fat include: Removing the skin from poultry. Removing all visible fats from meats. Steaming vegetables in water or broth. Meal planning  At meals, imagine dividing your plate into fourths: Fill one-half of your plate with vegetables and green salads. Fill one-fourth of your plate with whole grains. Fill one-fourth of your plate with lean protein foods. Eat 2-4 cups of vegetables per day. One cup of vegetables equals 1 cup (91 g) broccoli or cauliflower florets, 2 medium carrots, 1 large bell pepper, 1 large sweet potato, 1 large tomato, 1 medium white potato, 2 cups (150 g) raw leafy greens. Eat 1-2 cups of fruit per day. One cup of fruit equals 1 small apple, 1 large banana, 1 cup (237 g) mixed fruit, 1 large orange,  cup (82 g) dried fruit, 1 cup (240 mL) 100% fruit juice. Eat more foods that contain soluble fiber. Examples include apples, broccoli, carrots, beans, peas, and barley. Aim to get 25-30 g of fiber per day. Increase your consumption of legumes, nuts, and seeds to 4-5 servings per week. One serving of dried beans or legumes equals  cup (90 g) cooked, 1 serving of nuts is  oz (12 almonds, 24 pistachios, or 7 walnut halves), and 1 serving of seeds equals  oz (8 g). Fats Choose healthy fats more often. Choose monounsaturated and polyunsaturated fats, such as olive and canola oils, avocado oil, flaxseeds, walnuts, almonds, and seeds. Eat more omega-3 fats. Choose salmon, mackerel, sardines, tuna, flaxseed oil, and ground  flaxseeds. Aim to eat fish at least 2 times each week. Check food labels carefully to identify foods with trans fats or high amounts of saturated fat. Limit saturated fats. These are found in animal products, such as meats, butter, and cream. Plant sources of saturated fats include palm oil, palm kernel oil, and coconut oil. Avoid foods with partially hydrogenated oils in them. These contain trans fats. Examples are stick margarine, some tub margarines, cookies, crackers, and other baked goods. Avoid fried foods. General information Eat more home-cooked food and less restaurant, buffet, and fast food. Limit or avoid alcohol. Limit foods that are high in added sugar and simple starches such as foods made using white refined flour (white breads, pastries, sweets). Lose weight if you are overweight. Losing just 5-10% of your body weight can help your overall health and prevent diseases such as diabetes and heart disease. Monitor your sodium intake, especially if you have high blood pressure. Talk with your health care provider about your sodium intake. Try to incorporate more vegetarian meals weekly. What foods should I eat? Fruits All fresh, canned (in natural juice), or frozen fruits. Vegetables Fresh or frozen vegetables (raw, steamed, roasted, or grilled). Green salads. Grains Most grains. Choose whole wheat and whole grains most of the time. Rice and pasta, including brown rice and pastas made with whole wheat. Meats and other proteins Lean, well-trimmed  beef, veal, pork, and lamb. Chicken and [place] without skin. All fish and shellfish. Wild duck, rabbit, pheasant, and venison. Egg whites or low-cholesterol egg substitutes. Dried beans, peas, lentils, and tofu. Seeds and most nuts. Dairy Low-fat or nonfat cheeses, including ricotta and mozzarella. Skim or 1% milk (liquid, powdered, or evaporated). Buttermilk made with low-fat milk. Nonfat or low-fat yogurt. Fats and oils Non-hydrogenated  (trans-free) margarines. Vegetable oils, including soybean, sesame, sunflower, olive, avocado, peanut, safflower, corn, canola, and cottonseed. Salad dressings or mayonnaise made with a vegetable oil. Beverages Water (mineral or sparkling). Coffee and tea. Unsweetened ice tea. Diet beverages. Sweets and desserts Sherbet, gelatin, and fruit ice. Small amounts of dark chocolate. Limit all sweets and desserts. Seasonings and condiments All seasonings and condiments. The items listed above may not be a complete list of foods and beverages you can eat. Contact a dietitian for more options. What foods should I avoid? Fruits Canned fruit in heavy syrup. Fruit in cream or butter sauce. Fried fruit. Limit coconut. Vegetables Vegetables cooked in cheese, cream, or butter sauce. Fried vegetables. Grains Breads made with saturated or trans fats, oils, or whole milk. Croissants. Sweet rolls. Donuts. High-fat crackers, such as cheese crackers and chips. Meats and other proteins Fatty meats, such as hot dogs, ribs, sausage, bacon, rib-eye roast or steak. High-fat deli meats, such as salami and bologna. Caviar. Domestic duck and goose. Organ meats, such as liver. Dairy Cream, sour cream, cream cheese, and creamed cottage cheese. Whole-milk cheeses. Whole or 2% milk (liquid, evaporated, or condensed). Whole buttermilk. Cream sauce or high-fat cheese sauce. Whole-milk yogurt. Fats and oils Meat fat, or shortening. Cocoa butter, hydrogenated oils, palm oil, coconut oil, palm kernel oil. Solid fats and shortenings, including bacon fat, salt pork, lard, and butter. Nondairy cream substitutes. Salad dressings with cheese or sour cream. Beverages Regular sodas and any drinks with added sugar. Sweets and desserts Frosting. Pudding. Cookies. Cakes. Pies. Milk chocolate or white chocolate. Buttered syrups. Full-fat ice cream or ice cream drinks. The items listed above may not be a complete list of foods and  beverages to avoid. Contact a dietitian for more information. Summary Heart-healthy meal planning includes limiting unhealthy fats, increasing healthy fats, limiting salt (sodium) intake and making other diet and lifestyle changes. Lose weight if you are overweight. Losing just 5-10% of your body weight can help your overall health and prevent diseases such as diabetes and heart disease. Focus on eating a balance of foods, including fruits and vegetables, low-fat or nonfat dairy, lean protein, nuts and legumes, whole grains, and heart-healthy oils and fats. This information is not intended to replace advice given to you by your health care provider. Make sure you discuss any questions you have with your health care provider. Document Revised: 03/08/2021 Document Reviewed: 03/08/2021 Elsevier Patient Education  2024 [employer].

## 2022-08-21 ENCOUNTER — Other Ambulatory Visit: Payer: Self-pay | Admitting: Cardiology

## 2022-08-22 ENCOUNTER — Other Ambulatory Visit: Payer: Self-pay

## 2022-08-22 MED ORDER — SPIRONOLACTONE 25 MG PO TABS: ORAL_TABLET | ORAL | 3 refills | Status: DC

## 2022-08-23 ENCOUNTER — Other Ambulatory Visit (HOSPITAL_COMMUNITY): Payer: Medicare Other

## 2022-09-09 ENCOUNTER — Ambulatory Visit (HOSPITAL_COMMUNITY): Payer: Medicare Other | Attending: Cardiovascular Disease

## 2022-09-09 DIAGNOSIS — I350 Nonrheumatic aortic (valve) stenosis: Secondary | ICD-10-CM | POA: Insufficient documentation

## 2022-09-09 LAB — ECHOCARDIOGRAM COMPLETE
AR max vel: 1.03 cm2
AV Area VTI: 1.04 cm2
AV Area mean vel: 1.1 cm2
AV Mean grad: 28 mmHg
AV Peak grad: 46.8 mmHg
Ao pk vel: 3.42 m/s
Area-P 1/2: 2.92 cm2
S' Lateral: 4 cm

## 2022-09-14 ENCOUNTER — Other Ambulatory Visit: Payer: Self-pay | Admitting: *Deleted

## 2022-09-14 DIAGNOSIS — I350 Nonrheumatic aortic (valve) stenosis: Secondary | ICD-10-CM

## 2022-09-14 DIAGNOSIS — I5042 Chronic combined systolic (congestive) and diastolic (congestive) heart failure: Secondary | ICD-10-CM

## 2022-09-20 ENCOUNTER — Ambulatory Visit: Payer: Medicare Other | Admitting: Endocrinology

## 2022-09-20 ENCOUNTER — Other Ambulatory Visit: Payer: Medicare Other

## 2022-09-28 ENCOUNTER — Telehealth: Payer: Self-pay | Admitting: Physician Assistant

## 2022-09-28 NOTE — Telephone Encounter (Signed)
[name] with [facility] states she did PVD Screening - Patient's left leg was abnormal. Result was mild.

## 2022-09-29 ENCOUNTER — Other Ambulatory Visit: Payer: Self-pay

## 2022-09-29 DIAGNOSIS — I739 Peripheral vascular disease, unspecified: Secondary | ICD-10-CM

## 2022-09-29 NOTE — Telephone Encounter (Signed)
Please see call note and advise next steps

## 2022-09-30 ENCOUNTER — Ambulatory Visit (HOSPITAL_COMMUNITY): Admission: RE | Admit: 2022-09-30 | Payer: Medicare Other | Source: Ambulatory Visit

## 2022-10-03 ENCOUNTER — Encounter: Payer: Medicare Other | Admitting: Surgery

## 2022-10-07 NOTE — Telephone Encounter (Signed)
Pt would like a call back about message below

## 2022-10-20 ENCOUNTER — Encounter: Payer: Self-pay | Admitting: Gastroenterology

## 2022-10-26 ENCOUNTER — Encounter: Payer: Self-pay | Admitting: Vascular Surgery

## 2022-10-26 ENCOUNTER — Ambulatory Visit: Payer: Medicare Other | Admitting: Vascular Surgery

## 2022-10-26 ENCOUNTER — Ambulatory Visit (HOSPITAL_COMMUNITY)
Admission: RE | Admit: 2022-10-26 | Discharge: 2022-10-26 | Disposition: A | Discharge status: Home / Self Care | Payer: Medicare Other | Source: Ambulatory Visit | Attending: Surgery | Admitting: Surgery

## 2022-10-26 VITALS — BP 106/59 | HR 72 | Temp 98.0°F | Resp 20 | Ht 64.0 in | Wt 229.0 lb

## 2022-10-26 DIAGNOSIS — I739 Peripheral vascular disease, unspecified: Secondary | ICD-10-CM | POA: Insufficient documentation

## 2022-10-26 DIAGNOSIS — M25561 Pain in right knee: Secondary | ICD-10-CM

## 2022-10-26 DIAGNOSIS — M25562 Pain in left knee: Secondary | ICD-10-CM | POA: Diagnosis not present

## 2022-10-26 LAB — VAS US ABI WITH/WO TBI
Left ABI: 1.04
Right ABI: 1.03

## 2022-10-26 NOTE — Progress Notes (Signed)
Patient ID: [name], female   DOB: 08/27/1955, 67 y.o.   MRN: [id number]  Reason for Consult: New Patient (Initial Visit)   Referred by [name], Crist Infante, PA-C  Subjective:     HPI:  [name] is a 67 y.o. 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  She has a history of bilateral lower extremity pain mostly focused on her joints and has been evaluated for knee replacements in the past.  She did not have any tissue loss or ulceration.  She does have occasional swelling of both lower extremities with a history of heart failure.  She has diabetes, hyperlipidemia and hypertension.  Other than knee pain she is able to walk without limitations.  Past Medical History:  Diagnosis Date   Anxiety    Aortic stenosis, mild    echo 2017   Breast cancer (HCC) 1996   left breast   Carpal tunnel syndrome    Cataract    Chronic diastolic CHF (congestive heart failure) (HCC)    NYHA class II   COVID-19    12/02/21   Diabetes mellitus without complication (HCC)    Hyperlipidemia    Hypertension    Left bundle branch block (LBBB)    Low back pain    Neuropathy    NICM (nonischemic cardiomyopathy) (HCC)    EF 30%, cath 05/20/2015 clean coronary.  EF resolved by echo with EF 55%   OSA on CPAP    Prediabetes    Pulmonary HTN (HCC) 05/2015   Severe with PASP 67/59mmHg - resolved on followup echo   PVC's (premature ventricular contractions) 11/04/2015   Sleep apnea    cpap   Family History  Problem Relation Age of Onset   Arthritis Mother    Depression Mother    Hearing loss Mother    Hyperlipidemia Mother    Miscarriages / [place] Mother    Colon cancer Mother 37       2010   Colon polyps Mother    Alcohol abuse Father    Cancer Father        PROSTATE   Depression Father    Hypertension Father    Hyperlipidemia Father    Drug abuse Brother    Early death Maternal Grandmother    Cancer Paternal Grandmother    Heart attack Neg Hx    Diabetes Neg Hx    Esophageal  cancer Neg Hx    Rectal cancer Neg Hx    Stomach cancer Neg Hx    Past Surgical History:  Procedure Laterality Date   BREAST SURGERY     CARDIAC CATHETERIZATION N/A 05/20/2015   Procedure: Right/Left Heart Cath and Coronary Angiography;  Surgeon: Lennette Bihari, MD;  Location: [facility];  Service: Cardiovascular;  Laterality: N/A;   CATARACT EXTRACTION W/ INTRAOCULAR LENS  IMPLANT, BILATERAL     COLONOSCOPY  2018   DILATION AND CURETTAGE OF UTERUS     lumpectomy left breast     POLYPECTOMY     UTERINE FIBROID SURGERY      Short Social History:  Social History   Tobacco Use   Smoking status: Former    Current packs/day: 0.00    Types: Cigarettes    Quit date: 06/18/2009    Years since quitting: 13.3   Smokeless tobacco: Never  Substance Use Topics   Alcohol use: Yes    Comment: once every 2 months    Allergies  Allergen Reactions   Jardiance [Empagliflozin] Anaphylaxis,  Itching, Rash and Other (See Comments)    Yeast infection   Atorvastatin Other (See Comments)    REACTION: mylagias   Pravastatin Sodium Other (See Comments)    REACTION: myalgias   Sulfa Antibiotics Hives    Current Outpatient Medications  Medication Sig Dispense Refill   acetaminophen (TYLENOL) 650 MG CR tablet Take 1,300 mg by mouth as needed for pain.     aspirin 81 MG tablet Take 81 mg by mouth daily.     Blood Glucose Monitoring Suppl (ONETOUCH VERIO FLEX SYSTEM) w/Device KIT Use onetouch verio flex meter to check blood sugar 3 times weekly, alternating between fasting and 2 hours after meals. 1 kit 0   carvedilol (COREG) 6.25 MG tablet TAKE 1 TABLET BY MOUTH TWICE DAILY WITH A MEAL 180 tablet 3   cetirizine (ZYRTEC) 10 MG tablet Take 1 tablet (10 mg total) by mouth daily. 14 tablet 0   clotrimazole (LOTRIMIN) 1 % cream Apply 1 Application topically 2 (two) times daily. 60 g 0   Diclofenac Sodium (VOLTAREN PO) Take 1 capsule by mouth daily. *Pt unsure of strength*     diclofenac Sodium  (VOLTAREN) 1 % GEL Apply topically 4 (four) times daily. Arthritis     diphenhydrAMINE HCl (BENADRYL ALLERGY PO) Take by mouth.     ezetimibe (ZETIA) 10 MG tablet Take 1 tablet (10 mg total) by mouth daily. 90 tablet 0   fluticasone (FLONASE) 50 MCG/ACT nasal spray Place 2 sprays into both nostrils daily. 16 g 0   furosemide (LASIX) 40 MG tablet TAKE 1 TO 2 TABLETS BY MOUTH ONCE DAILY AS DIRECTED 180 tablet 2   gabapentin (NEURONTIN) 100 MG capsule Take 1 capsule (100 mg total) by mouth 3 (three) times daily as needed. 90 capsule 1   glucose blood (ONETOUCH VERIO) test strip USE 1 STRIP TO CHECK BLOOD SUGAR TWO TO THREE TIMES A DAY 300 each 2   metFORMIN (GLUCOPHAGE-XR) 500 MG 24 hr tablet 3 tabs qd 270 tablet 3   Multiple Vitamin (MULTIVITAMIN) tablet Take 1 tablet by mouth daily.      OneTouch Delica Lancets 30G MISC Use OneTouch Delica lancets to check blood sugar 3 times weekly, alternating between fasting and 2 hours after meals. 100 each 2   rosuvastatin (CRESTOR) 5 MG tablet Take 1 tablet by mouth once daily 90 tablet 0   sacubitril-valsartan (ENTRESTO) 24-26 MG Take 1 tablet by mouth 2 (two) times daily. 180 tablet 1   spironolactone (ALDACTONE) 25 MG tablet TAKE 1/2 (ONE-HALF) TABLET BY MOUTH ONCE DAILY . 45 tablet 3   vitamin C (ASCORBIC ACID) 500 MG tablet Take 2,000 mg by mouth as needed.      No current facility-administered medications for this visit.    Review of Systems  Constitutional:  Constitutional negative. HENT: HENT negative.  Eyes: Eyes negative.  Respiratory: Respiratory negative.  Cardiovascular: Positive for leg swelling. Negative for claudication.  GI: Gastrointestinal negative.  Musculoskeletal: Positive for gait problem, leg pain and joint pain.  Skin: Skin negative.  Hematologic: Hematologic/lymphatic negative.  Psychiatric: Psychiatric negative.        Objective:  Objective   Vitals:   10/26/22 1416  BP: (!) 106/59  Pulse: 72  Resp: 20  Temp: 98  F (36.7 C)  SpO2: 96%  Weight: 229 lb (103.9 kg)  Height: 5\' 4"  (1.626 m)   Body mass index is 39.31 kg/m.  Physical Exam HENT:     Head: Normocephalic.     Nose:  Nose normal.  Eyes:     Pupils: Pupils are equal, round, and reactive to light.  Neck:     Vascular: No carotid bruit.  Cardiovascular:     Pulses: Normal pulses.     Heart sounds: Murmur heard.  Pulmonary:     Effort: Pulmonary effort is normal.  Abdominal:     General: Abdomen is flat.     Palpations: Abdomen is soft.  Musculoskeletal:        General: Normal range of motion.     Cervical back: Neck supple.     Right lower leg: No edema.     Left lower leg: No edema.  Skin:    General: Skin is warm.     Capillary Refill: Capillary refill takes less than 2 seconds.  Neurological:     General: No focal deficit present.     Mental Status: She is alert.  Psychiatric:        Mood and Affect: Mood normal.        Thought Content: Thought content normal.        Judgment: Judgment normal.     Data: ABI Findings:  +---------+------------------+-----+-----------+--------+  Right   Rt Pressure (mmHg)IndexWaveform   Comment   +---------+------------------+-----+-----------+--------+  Brachial 116                                         +---------+------------------+-----+-----------+--------+  PTA     120               1.03                      +---------+------------------+-----+-----------+--------+  PERO                           multiphasic          +---------+------------------+-----+-----------+--------+  DP      110               0.95 multiphasic          +---------+------------------+-----+-----------+--------+  Great Toe96                0.83 Normal               +---------+------------------+-----+-----------+--------+   +---------+------------------+-----+-----------+-------+  Left    Lt Pressure (mmHg)IndexWaveform   Comment   +---------+------------------+-----+-----------+-------+  PTA     121               1.04 multiphasic         +---------+------------------+-----+-----------+-------+  DP      120               1.03 multiphasic         +---------+------------------+-----+-----------+-------+  Great Toe105               0.91 Normal              +---------+------------------+-----+-----------+-------+   +-------+-----------+-----------+------------+------------+  ABI/TBIToday's ABIToday's TBIPrevious ABIPrevious TBI  +-------+-----------+-----------+------------+------------+  Right 1.03       0.83                                 +-------+-----------+-----------+------------+------------+  Left  1.04       0.91                                 +-------+-----------+-----------+------------+------------+  Summary:  Right: Resting right ankle-brachial index is within normal range. The  right toe-brachial index is normal.   Left: Resting left ankle-brachial index is within normal range. The left  toe-brachial index is normal.      Assessment/Plan:    67 year old female with bilateral lower extremity pain that appears to be orthopedic in nature.  She does not have any arterial insufficiency as such, an as-needed basis.     Maeola Harman MD [facility]

## 2022-10-31 ENCOUNTER — Encounter: Payer: Self-pay | Admitting: Physician Assistant

## 2022-10-31 ENCOUNTER — Telehealth (INDEPENDENT_AMBULATORY_CARE_PROVIDER_SITE_OTHER): Payer: Medicare Other | Admitting: Physician Assistant

## 2022-10-31 VITALS — Ht 64.0 in | Wt 225.0 lb

## 2022-10-31 DIAGNOSIS — U071 COVID-19: Secondary | ICD-10-CM | POA: Diagnosis not present

## 2022-10-31 MED ORDER — NIRMATRELVIR/RITONAVIR (PAXLOVID)TABLET: 3.0000 | ORAL_TABLET | Freq: Two times a day (BID) | ORAL | 0 refills | Status: DC

## 2022-10-31 NOTE — Progress Notes (Signed)
Virtual Visit via Video   I connected with [name] on 10/31/22 at  8:40 AM EDT by a video enabled telemedicine application and verified that I am speaking with the correct person using two identifiers. Location patient: Home Location provider: [facility], Office Persons participating in the virtual visit: [name], Schroll PA-C, Corky Mull, LPN   I discussed the limitations of evaluation and management by telemedicine and the availability of in person appointments. The patient expressed understanding and agreed to proceed.  I acted as a Neurosurgeon for [employer], PA-C [employer], LPN   Subjective:   HPI:   COVID-19 Positive Symptom onset: started Thursday 9/12 Travel/contacts: Travel, Drives a commercial bus  Vaccination status: 3 vaccines Testing results: Home test positive yesterday  She has had COVID in Oct 2023 and took paxlovid  Patient endorses the following symptoms: cough expectorating yellow sputum, headache, chest congestion,sore throat, poor appetite  Patient denies the following symptoms: Denies chest pain, SOB  Treatments tried: Robitussin, Corcidin  ROS: See pertinent positives and negatives per HPI.  Patient Active Problem List   Diagnosis Date Noted   Family history of colon cancer in mother 04/15/2020   Shoulder pain, bilateral 12/11/2019   Type 2 diabetes mellitus without complications (HCC) 11/07/2016   PVC's (premature ventricular contractions) 11/04/2015   Aortic stenosis, mild    Heart palpitations 07/28/2015   Morbid obesity (HCC) 05/23/2015   Hypertensive heart disease    Nonischemic cardiomyopathy (HCC)    NSVT (nonsustained ventricular tachycardia) (HCC) 05/21/2015   Chronic diastolic heart failure (HCC) 05/20/2015   OSA (obstructive sleep apnea) 01/02/2014   LBBB (left bundle branch block) 01/02/2014   Mixed hyperlipidemia 04/15/2010   Personal history of malignant neoplasm of breast 04/15/2010    Social  History   Tobacco Use   Smoking status: Former    Current packs/day: 0.00    Types: Cigarettes    Quit date: 06/18/2009    Years since quitting: 13.3   Smokeless tobacco: Never  Substance Use Topics   Alcohol use: Yes    Comment: once every 2 months    Current Outpatient Medications:    acetaminophen (TYLENOL) 650 MG CR tablet, Take 1,300 mg by mouth as needed for pain., Disp: , Rfl:    aspirin 81 MG tablet, Take 81 mg by mouth daily., Disp: , Rfl:    Blood Glucose Monitoring Suppl (ONETOUCH VERIO FLEX SYSTEM) w/Device KIT, Use onetouch verio flex meter to check blood sugar 3 times weekly, alternating between fasting and 2 hours after meals., Disp: 1 kit, Rfl: 0   carvedilol (COREG) 6.25 MG tablet, TAKE 1 TABLET BY MOUTH TWICE DAILY WITH A MEAL, Disp: 180 tablet, Rfl: 3   cetirizine (ZYRTEC) 10 MG tablet, Take 1 tablet (10 mg total) by mouth daily., Disp: 14 tablet, Rfl: 0   clotrimazole (LOTRIMIN) 1 % cream, Apply 1 Application topically 2 (two) times daily., Disp: 60 g, Rfl: 0   Diclofenac Sodium (VOLTAREN PO), Take 1 capsule by mouth daily. *Pt unsure of strength*, Disp: , Rfl:    diclofenac Sodium (VOLTAREN) 1 % GEL, Apply topically 4 (four) times daily. Arthritis, Disp: , Rfl:    diphenhydrAMINE HCl (BENADRYL ALLERGY PO), Take by mouth., Disp: , Rfl:    ezetimibe (ZETIA) 10 MG tablet, Take 1 tablet (10 mg total) by mouth daily., Disp: 90 tablet, Rfl: 0   furosemide (LASIX) 40 MG tablet, TAKE 1 TO 2 TABLETS BY MOUTH ONCE DAILY AS DIRECTED,  Disp: 180 tablet, Rfl: 2   glucose blood (ONETOUCH VERIO) test strip, USE 1 STRIP TO CHECK BLOOD SUGAR TWO TO THREE TIMES A DAY, Disp: 300 each, Rfl: 2   metFORMIN (GLUCOPHAGE-XR) 500 MG 24 hr tablet, 3 tabs qd, Disp: 270 tablet, Rfl: 3   Multiple Vitamin (MULTIVITAMIN) tablet, Take 1 tablet by mouth daily. , Disp: , Rfl:    nirmatrelvir/ritonavir (PAXLOVID) 20 x 150 MG & 10 x 100MG  TABS, Take 3 tablets by mouth 2 (two) times daily for 5 days. (Take  nirmatrelvir 150 mg two tablets twice daily for 5 days and ritonavir 100 mg one tablet twice daily for 5 days) HOLD ROSUVASTATIN WHILE TAKING THIS MEDICATION, Disp: 30 tablet, Rfl: 0   OneTouch Delica Lancets 30G MISC, Use OneTouch Delica lancets to check blood sugar 3 times weekly, alternating between fasting and 2 hours after meals., Disp: 100 each, Rfl: 2   rosuvastatin (CRESTOR) 5 MG tablet, Take 1 tablet by mouth once daily, Disp: 90 tablet, Rfl: 0   sacubitril-valsartan (ENTRESTO) 24-26 MG, Take 1 tablet by mouth 2 (two) times daily., Disp: 180 tablet, Rfl: 1   spironolactone (ALDACTONE) 25 MG tablet, TAKE 1/2 (ONE-HALF) TABLET BY MOUTH ONCE DAILY ., Disp: 45 tablet, Rfl: 3   vitamin C (ASCORBIC ACID) 500 MG tablet, Take 2,000 mg by mouth as needed. , Disp: , Rfl:    fluticasone (FLONASE) 50 MCG/ACT nasal spray, Place 2 sprays into both nostrils daily. (Patient not taking: Reported on 10/31/2022), Disp: 16 g, Rfl: 0  Allergies  Allergen Reactions   Jardiance [Empagliflozin] Anaphylaxis, Itching, Rash and Other (See Comments)    Yeast infection   Atorvastatin Other (See Comments)    REACTION: mylagias   Pravastatin Sodium Other (See Comments)    REACTION: myalgias   Sulfa Antibiotics Hives    Objective:   VITALS: Per patient if applicable, see vitals. GENERAL: Alert, appears well and in no acute distress. HEENT: Atraumatic, conjunctiva clear, no obvious abnormalities on inspection of external nose and ears. NECK: Normal movements of the head and neck. CARDIOPULMONARY: No increased WOB. Speaking in clear sentences. I:E ratio WNL.  MS: Moves all visible extremities without noticeable abnormality. PSYCH: Pleasant and cooperative, well-groomed. Speech normal rate and rhythm. Affect is appropriate. Insight and judgement are appropriate. Attention is focused, linear, and appropriate.  NEURO: CN grossly intact. Oriented as arrived to appointment on time with no prompting. Moves both UE  equally.  SKIN: No obvious lesions, wounds, erythema, or cyanosis noted on face or hands.  Assessment and Plan:   Laurelle was seen today for covid positive.  Diagnoses and all orders for this visit:  COVID-19  Other orders -     nirmatrelvir/ritonavir (PAXLOVID) 20 x 150 MG & 10 x 100MG  TABS; Take 3 tablets by mouth 2 (two) times daily for 5 days. (Take nirmatrelvir 150 mg two tablets twice daily for 5 days and ritonavir 100 mg one tablet twice daily for 5 days) HOLD ROSUVASTATIN WHILE TAKING THIS MEDICATION    No red flags on discussion, patient is not in any obvious distress during our visit. Discussed progression of most viral illnesses, and recommended supportive care at this point in time.   Will prescribe Paxlovid for her current illness. Discussed potential medication interaction with her rosuvastatin - discussed recommendation to hold rosuvastatin during duration of Paxlovid.  Discussed over the counter supportive care options, including Tylenol 500 mg q 8 hours, with recommendations to push fluids and rest. Reviewed return precautions including  new/worsening fever, SOB, new/worsening cough, sudden onset changes of symptoms. Recommended need to self-quarantine and practice social distancing until symptoms resolve. I recommend that patient follow-up if symptoms worsen or persist despite treatment x 7-10 days, sooner if needed.  I discussed the assessment and treatment plan with the patient. The patient was provided an opportunity to ask questions and all were answered. The patient agreed with the plan and demonstrated an understanding of the instructions.   The patient was advised to call back or seek an in-person evaluation if the symptoms worsen or if the condition fails to improve as anticipated.    [place], [place] 10/31/2022

## 2022-11-01 ENCOUNTER — Encounter (HOSPITAL_COMMUNITY): Payer: Self-pay | Admitting: Internal Medicine

## 2022-11-01 ENCOUNTER — Emergency Department (HOSPITAL_COMMUNITY): Payer: Medicare Other

## 2022-11-01 ENCOUNTER — Observation Stay (HOSPITAL_COMMUNITY)
Admission: EM | Admit: 2022-11-01 | Discharge: 2022-11-02 | Disposition: A | Discharge status: Home / Self Care | Payer: Medicare Other | Attending: Internal Medicine | Admitting: Internal Medicine

## 2022-11-01 ENCOUNTER — Other Ambulatory Visit: Payer: Self-pay

## 2022-11-01 DIAGNOSIS — G4733 Obstructive sleep apnea (adult) (pediatric): Secondary | ICD-10-CM | POA: Diagnosis present

## 2022-11-01 DIAGNOSIS — I119 Hypertensive heart disease without heart failure: Secondary | ICD-10-CM | POA: Diagnosis present

## 2022-11-01 DIAGNOSIS — J1282 Pneumonia due to coronavirus disease 2019: Secondary | ICD-10-CM | POA: Diagnosis not present

## 2022-11-01 DIAGNOSIS — Z79899 Other long term (current) drug therapy: Secondary | ICD-10-CM | POA: Insufficient documentation

## 2022-11-01 DIAGNOSIS — Z8616 Personal history of COVID-19: Secondary | ICD-10-CM | POA: Diagnosis not present

## 2022-11-01 DIAGNOSIS — I5042 Chronic combined systolic (congestive) and diastolic (congestive) heart failure: Secondary | ICD-10-CM | POA: Diagnosis not present

## 2022-11-01 DIAGNOSIS — Z1152 Encounter for screening for COVID-19: Secondary | ICD-10-CM | POA: Insufficient documentation

## 2022-11-01 DIAGNOSIS — J157 Pneumonia due to Mycoplasma pneumoniae: Secondary | ICD-10-CM | POA: Diagnosis not present

## 2022-11-01 DIAGNOSIS — R079 Chest pain, unspecified: Secondary | ICD-10-CM | POA: Diagnosis not present

## 2022-11-01 DIAGNOSIS — R509 Fever, unspecified: Secondary | ICD-10-CM | POA: Diagnosis not present

## 2022-11-01 DIAGNOSIS — J189 Pneumonia, unspecified organism: Secondary | ICD-10-CM | POA: Insufficient documentation

## 2022-11-01 DIAGNOSIS — Z853 Personal history of malignant neoplasm of breast: Secondary | ICD-10-CM | POA: Insufficient documentation

## 2022-11-01 DIAGNOSIS — Z87891 Personal history of nicotine dependence: Secondary | ICD-10-CM | POA: Diagnosis not present

## 2022-11-01 DIAGNOSIS — E782 Mixed hyperlipidemia: Secondary | ICD-10-CM | POA: Insufficient documentation

## 2022-11-01 DIAGNOSIS — J9601 Acute respiratory failure with hypoxia: Secondary | ICD-10-CM | POA: Diagnosis not present

## 2022-11-01 DIAGNOSIS — R0602 Shortness of breath: Secondary | ICD-10-CM | POA: Diagnosis present

## 2022-11-01 DIAGNOSIS — U071 COVID-19: Secondary | ICD-10-CM | POA: Diagnosis not present

## 2022-11-01 DIAGNOSIS — I110 Hypertensive heart disease with heart failure: Secondary | ICD-10-CM | POA: Insufficient documentation

## 2022-11-01 DIAGNOSIS — I5023 Acute on chronic systolic (congestive) heart failure: Secondary | ICD-10-CM | POA: Diagnosis present

## 2022-11-01 DIAGNOSIS — E7800 Pure hypercholesterolemia, unspecified: Secondary | ICD-10-CM | POA: Diagnosis present

## 2022-11-01 DIAGNOSIS — E119 Type 2 diabetes mellitus without complications: Secondary | ICD-10-CM

## 2022-11-01 DIAGNOSIS — Z7982 Long term (current) use of aspirin: Secondary | ICD-10-CM | POA: Diagnosis not present

## 2022-11-01 DIAGNOSIS — R059 Cough, unspecified: Secondary | ICD-10-CM | POA: Diagnosis not present

## 2022-11-01 LAB — CBC WITH DIFFERENTIAL/PLATELET
Abs Immature Granulocytes: 0.03 10*3/uL (ref 0.00–0.07)
Basophils Absolute: 0 10*3/uL (ref 0.0–0.1)
Basophils Relative: 0 %
Eosinophils Absolute: 0.1 10*3/uL (ref 0.0–0.5)
Eosinophils Relative: 1 %
HCT: 35.6 % — ABNORMAL LOW (ref 36.0–46.0)
Hemoglobin: 11.7 g/dL — ABNORMAL LOW (ref 12.0–15.0)
Immature Granulocytes: 0 %
Lymphocytes Relative: 17 %
Lymphs Abs: 1.7 10*3/uL (ref 0.7–4.0)
MCH: 30.4 pg (ref 26.0–34.0)
MCHC: 32.9 g/dL (ref 30.0–36.0)
MCV: 92.5 fL (ref 80.0–100.0)
Monocytes Absolute: 0.8 10*3/uL (ref 0.1–1.0)
Monocytes Relative: 8 %
Neutro Abs: 7.1 10*3/uL (ref 1.7–7.7)
Neutrophils Relative %: 74 %
Platelets: 285 10*3/uL (ref 150–400)
RBC: 3.85 MIL/uL — ABNORMAL LOW (ref 3.87–5.11)
RDW: 14.1 % (ref 11.5–15.5)
WBC: 9.7 10*3/uL (ref 4.0–10.5)
nRBC: 0 % (ref 0.0–0.2)

## 2022-11-01 LAB — COMPREHENSIVE METABOLIC PANEL
ALT: 19 U/L (ref 0–44)
AST: 16 U/L (ref 15–41)
Albumin: 3.2 g/dL — ABNORMAL LOW (ref 3.5–5.0)
Alkaline Phosphatase: 44 U/L (ref 38–126)
Anion gap: 9 (ref 5–15)
BUN: 6 mg/dL — ABNORMAL LOW (ref 8–23)
CO2: 23 mmol/L (ref 22–32)
Calcium: 8.9 mg/dL (ref 8.9–10.3)
Chloride: 101 mmol/L (ref 98–111)
Creatinine, Ser: 0.64 mg/dL (ref 0.44–1.00)
GFR, Estimated: >60 mL/min (ref >60)
Glucose, Bld: 116 mg/dL — ABNORMAL HIGH (ref 70–99)
Potassium: 3.5 mmol/L (ref 3.5–5.1)
Sodium: 133 mmol/L — ABNORMAL LOW (ref 135–145)
Total Bilirubin: 0.6 mg/dL (ref 0.3–1.2)
Total Protein: 6.7 g/dL (ref 6.5–8.1)

## 2022-11-01 LAB — URINALYSIS, W/ REFLEX TO CULTURE (INFECTION SUSPECTED)
Bilirubin Urine: NEGATIVE
Glucose, UA: NEGATIVE mg/dL
Ketones, ur: NEGATIVE mg/dL
Leukocytes,Ua: NEGATIVE
Nitrite: NEGATIVE
Protein, ur: 30 mg/dL — AB
Specific Gravity, Urine: 1.017 (ref 1.005–1.030)
pH: 6 (ref 5.0–8.0)

## 2022-11-01 LAB — RESPIRATORY PANEL BY PCR
Adenovirus: NOT DETECTED
Bordetella Parapertussis: NOT DETECTED
Bordetella pertussis: NOT DETECTED
Chlamydophila pneumoniae: NOT DETECTED
Coronavirus 229E: NOT DETECTED
Coronavirus HKU1: NOT DETECTED
Coronavirus NL63: NOT DETECTED
Coronavirus OC43: NOT DETECTED
Influenza A: NOT DETECTED
Influenza B: NOT DETECTED
Metapneumovirus: NOT DETECTED
Mycoplasma pneumoniae: DETECTED — AB
Parainfluenza Virus 1: NOT DETECTED
Parainfluenza Virus 2: NOT DETECTED
Parainfluenza Virus 3: NOT DETECTED
Parainfluenza Virus 4: NOT DETECTED
Respiratory Syncytial Virus: NOT DETECTED
Rhinovirus / Enterovirus: NOT DETECTED

## 2022-11-01 LAB — CBG MONITORING, ED: Glucose-Capillary: 129 mg/dL — ABNORMAL HIGH (ref 70–99)

## 2022-11-01 LAB — SARS CORONAVIRUS 2 BY RT PCR: SARS Coronavirus 2 by RT PCR: NEGATIVE

## 2022-11-01 LAB — RESP PANEL BY RT-PCR (RSV, FLU A&B, COVID)  RVPGX2
Influenza A by PCR: NEGATIVE
Influenza B by PCR: NEGATIVE
Resp Syncytial Virus by PCR: NEGATIVE
SARS Coronavirus 2 by RT PCR: NEGATIVE

## 2022-11-01 LAB — PROCALCITONIN: Procalcitonin: <0.1 ng/mL

## 2022-11-01 LAB — I-STAT CG4 LACTIC ACID, ED: Lactic Acid, Venous: 0.6 mmol/L (ref 0.5–1.9)

## 2022-11-01 MED ORDER — INSULIN ASPART 100 UNIT/ML IJ SOLN
0.0000 [IU] | Freq: Three times a day (TID) | INTRAMUSCULAR | Status: DC
  Administered 2022-11-01: 2 [IU] via SUBCUTANEOUS

## 2022-11-01 MED ORDER — ENOXAPARIN SODIUM 40 MG/0.4ML IJ SOSY
40.0000 mg | PREFILLED_SYRINGE | INTRAMUSCULAR | Status: DC
  Administered 2022-11-01: 40 mg via SUBCUTANEOUS
  Filled 2022-11-01: qty 0.4

## 2022-11-01 MED ORDER — DM-GUAIFENESIN ER 30-600 MG PO TB12
1.0000 | ORAL_TABLET | Freq: Two times a day (BID) | ORAL | Status: DC
  Administered 2022-11-01 – 2022-11-02 (×3): 1 via ORAL
  Filled 2022-11-01 (×4): qty 1

## 2022-11-01 MED ORDER — ACETAMINOPHEN 325 MG PO TABS
650.0000 mg | ORAL_TABLET | Freq: Four times a day (QID) | ORAL | Status: DC | PRN
  Administered 2022-11-01 – 2022-11-02 (×2): 650 mg via ORAL
  Filled 2022-11-01 (×2): qty 2

## 2022-11-01 MED ORDER — EZETIMIBE 10 MG PO TABS
10.0000 mg | ORAL_TABLET | Freq: Every day | ORAL | Status: DC
  Filled 2022-11-01: qty 1

## 2022-11-01 MED ORDER — ACETAMINOPHEN 325 MG PO TABS
650.0000 mg | ORAL_TABLET | Freq: Once | ORAL | Status: AC
  Administered 2022-11-01: 650 mg via ORAL
  Filled 2022-11-01: qty 2

## 2022-11-01 MED ORDER — POLYETHYLENE GLYCOL 3350 17 G PO PACK: 17.0000 g | PACK | Freq: Every day | ORAL | Status: DC | PRN

## 2022-11-01 MED ORDER — SODIUM CHLORIDE 0.9 % IV SOLN
500.0000 mg | Freq: Once | INTRAVENOUS | Status: AC
  Administered 2022-11-01: 500 mg via INTRAVENOUS
  Filled 2022-11-01: qty 5

## 2022-11-01 MED ORDER — ACETAMINOPHEN 650 MG RE SUPP: 650.0000 mg | Freq: Four times a day (QID) | RECTAL | Status: DC | PRN

## 2022-11-01 MED ORDER — AZITHROMYCIN 250 MG PO TABS
250.0000 mg | ORAL_TABLET | Freq: Every day | ORAL | 0 refills | Status: DC
Start: 2022-11-01 — End: 2022-11-02

## 2022-11-01 MED ORDER — SPIRONOLACTONE 12.5 MG HALF TABLET
12.5000 mg | ORAL_TABLET | Freq: Every day | ORAL | Status: DC
  Administered 2022-11-02: 12.5 mg via ORAL
  Filled 2022-11-01: qty 1

## 2022-11-01 MED ORDER — FUROSEMIDE 40 MG PO TABS
40.0000 mg | ORAL_TABLET | Freq: Two times a day (BID) | ORAL | Status: DC
  Administered 2022-11-02: 40 mg via ORAL
  Filled 2022-11-01: qty 2
  Filled 2022-11-01: qty 1

## 2022-11-01 MED ORDER — CARVEDILOL 6.25 MG PO TABS
6.2500 mg | ORAL_TABLET | Freq: Two times a day (BID) | ORAL | Status: DC
  Administered 2022-11-01 – 2022-11-02 (×2): 6.25 mg via ORAL
  Filled 2022-11-01: qty 1
  Filled 2022-11-01: qty 2

## 2022-11-01 MED ORDER — SODIUM CHLORIDE 0.9 % IV SOLN
1.0000 g | Freq: Once | INTRAVENOUS | Status: AC
  Administered 2022-11-01: 1 g via INTRAVENOUS
  Filled 2022-11-01: qty 10

## 2022-11-01 MED ORDER — BENZONATATE 100 MG PO CAPS
100.0000 mg | ORAL_CAPSULE | Freq: Three times a day (TID) | ORAL | 0 refills | Status: DC
Start: 2022-11-01 — End: 2022-11-02

## 2022-11-01 MED ORDER — NIRMATRELVIR/RITONAVIR (PAXLOVID)TABLET
3.0000 | ORAL_TABLET | Freq: Two times a day (BID) | ORAL | Status: DC
  Filled 2022-11-01: qty 30

## 2022-11-01 MED ORDER — SODIUM CHLORIDE 0.9% FLUSH
3.0000 mL | Freq: Two times a day (BID) | INTRAVENOUS | Status: DC
  Administered 2022-11-01 – 2022-11-02 (×3): 3 mL via INTRAVENOUS

## 2022-11-01 MED ORDER — SACUBITRIL-VALSARTAN 24-26 MG PO TABS
1.0000 | ORAL_TABLET | Freq: Two times a day (BID) | ORAL | Status: DC
  Administered 2022-11-01 – 2022-11-02 (×2): 1 via ORAL
  Filled 2022-11-01 (×3): qty 1

## 2022-11-01 MED ORDER — ENSURE ENLIVE PO LIQD
237.0000 mL | Freq: Two times a day (BID) | ORAL | Status: DC
  Administered 2022-11-02: 237 mL via ORAL

## 2022-11-01 NOTE — ED Provider Notes (Signed)
[facility]ER[facility] DEPARTMENT AT [facility] Provider Note   CSN: [id number] Arrival date & time: 11/01/22  7829     History  Chief Complaint  Patient presents with   Shortness of Breath   Cough   Chest Pain   Covid Positive   Fever    [name] is a 67 y.o. female with a medical history significant for CHF and sleep apnea presents today for fever, cough, shortness of breath since Friday.  Patient reports at home COVID test was positive on Friday.  She began Paxlovid yesterday.  She admits congestion, positional orthopnea, body aches, chills.  She denies vomiting, diarrhea, pleuritic chest pain.   Shortness of Breath Associated symptoms: cough, ear pain, fever, headaches and sore throat   Associated symptoms: no chest pain and no vomiting   Cough Associated symptoms: chills, ear pain, fever, headaches, myalgias, shortness of breath and sore throat   Associated symptoms: no chest pain   Chest Pain Associated symptoms: cough, fatigue, fever, headache and shortness of breath   Associated symptoms: no vomiting and no weakness   Fever Associated symptoms: chills, congestion, cough, ear pain, headaches, myalgias and sore throat   Associated symptoms: no chest pain, no diarrhea and no vomiting        Home Medications Prior to Admission medications   Medication Sig Start Date End Date Taking? Authorizing Provider  acetaminophen (TYLENOL) 650 MG CR tablet Take 1,300 mg by mouth as needed for pain.   Yes [provider]  aspirin 81 MG tablet Take 81 mg by mouth daily.   Yes [provider]  azithromycin (ZITHROMAX) 250 MG tablet Take 1 tablet (250 mg total) by mouth daily. Take first 2 tablets together, then 1 every day until finished. 11/01/22  Yes Dolphus Jenny, PA-C  benzonatate (TESSALON) 100 MG capsule Take 1 capsule (100 mg total) by mouth every 8 (eight) hours. 11/01/22  Yes Dolphus Jenny, PA-C  carvedilol (COREG) 6.25 MG tablet TAKE 1  TABLET BY MOUTH TWICE DAILY WITH A MEAL 08/23/22  Yes Turner, Cornelious Bryant, MD  clotrimazole (LOTRIMIN) 1 % cream Apply 1 Application topically 2 (two) times daily. 08/10/21  Yes Willow Ora, MD  diclofenac Sodium (VOLTAREN) 1 % GEL Apply topically 4 (four) times daily. Arthritis   Yes [provider]  diphenhydrAMINE HCl (BENADRYL ALLERGY PO) Take 1 tablet by mouth as needed.   Yes [provider]  ezetimibe (ZETIA) 10 MG tablet Take 1 tablet (10 mg total) by mouth daily. 08/16/22  Yes Turner, Cornelious Bryant, MD  furosemide (LASIX) 40 MG tablet TAKE 1 TO 2 TABLETS BY MOUTH ONCE DAILY AS DIRECTED 12/03/21  Yes Turner, Cornelious Bryant, MD  metFORMIN (GLUCOPHAGE-XR) 500 MG 24 hr tablet 3 tabs qd 05/20/22  Yes Reather Littler, MD  Multiple Vitamin (MULTIVITAMIN) tablet Take 1 tablet by mouth daily.    Yes [provider]  nirmatrelvir/ritonavir (PAXLOVID) 20 x 150 MG & 10 x 100MG  TABS Take 3 tablets by mouth 2 (two) times daily for 5 days. (Take nirmatrelvir 150 mg two tablets twice daily for 5 days and ritonavir 100 mg one tablet twice daily for 5 days) HOLD ROSUVASTATIN WHILE TAKING THIS MEDICATION 10/31/22 11/05/22 Yes Jarold Motto, PA  rosuvastatin (CRESTOR) 5 MG tablet Take 1 tablet by mouth once daily 08/19/22  Yes [name], [name], PA-C  sacubitril-valsartan (ENTRESTO) 24-26 MG Take 1 tablet by mouth 2 (two) times daily. 01/24/22  Yes Quintella Reichert, MD  spironolactone (ALDACTONE) 25 MG tablet TAKE 1/2 (ONE-HALF) TABLET BY MOUTH ONCE DAILY . 08/22/22  Yes Conte, Tessa N, PA-C  vitamin C (ASCORBIC ACID) 500 MG tablet Take 2,000 mg by mouth as needed.    Yes [provider]  Blood Glucose Monitoring Suppl (ONETOUCH VERIO FLEX SYSTEM) w/Device KIT Use onetouch verio flex meter to check blood sugar 3 times weekly, alternating between fasting and 2 hours after meals. 10/03/19   Reather Littler, MD  glucose blood (ONETOUCH VERIO) test strip USE 1 STRIP TO CHECK BLOOD SUGAR TWO TO THREE TIMES A DAY  08/09/21   Reather Littler, MD  OneTouch Delica Lancets 30G MISC Use OneTouch Delica lancets to check blood sugar 3 times weekly, alternating between fasting and 2 hours after meals. 10/03/19   Reather Littler, MD      Allergies    Jardiance [empagliflozin], Atorvastatin, Pravastatin sodium, and Sulfa antibiotics    Review of Systems   Review of Systems  Constitutional:  Positive for chills, fatigue and fever.  HENT:  Positive for congestion, ear pain, sinus pain and sore throat.   Respiratory:  Positive for cough and shortness of breath.   Cardiovascular:  Negative for chest pain and leg swelling.  Gastrointestinal:  Negative for constipation, diarrhea and vomiting.  Musculoskeletal:  Positive for arthralgias and myalgias.  Neurological:  Positive for headaches. Negative for weakness.    Physical Exam Updated Vital Signs BP (!) 113/58   Pulse 83   Temp 98.6 F (37 C) (Oral)   Resp (!) 25   SpO2 93%  Physical Exam Vitals and nursing note reviewed.  Constitutional:      General: She is not in acute distress.    Appearance: She is well-developed. She is not toxic-appearing.  HENT:     Head: Normocephalic and atraumatic.     Mouth/Throat:     Mouth: Mucous membranes are moist.     Pharynx: Oropharynx is clear. No oropharyngeal exudate.  Eyes:     Conjunctiva/sclera: Conjunctivae normal.  Cardiovascular:     Rate and Rhythm: Normal rate and regular rhythm.     Heart sounds: No murmur heard. Pulmonary:     Effort: Pulmonary effort is normal. No respiratory distress.     Breath sounds: Examination of the right-lower field reveals rhonchi. Rhonchi present. No decreased breath sounds.  Abdominal:     Palpations: Abdomen is soft.     Tenderness: There is no abdominal tenderness.  Musculoskeletal:        General: No swelling.     Cervical back: Neck supple.     Right lower leg: No edema.     Left lower leg: No edema.  Skin:    General: Skin is warm and dry.     Capillary Refill:  Capillary refill takes less than 2 seconds.  Neurological:     Mental Status: She is alert.  Psychiatric:        Mood and Affect: Mood normal.     ED Results / Procedures / Treatments   Labs (all labs ordered are listed, but only abnormal results are displayed) Labs Reviewed  COMPREHENSIVE METABOLIC PANEL - Abnormal; Notable for the following components:      Result Value   Sodium 133 (*)    Glucose, Bld 116 (*)    BUN 6 (*)    Albumin 3.2 (*)    All other components within normal limits  CBC WITH DIFFERENTIAL/PLATELET - Abnormal; Notable for the following components:   RBC 3.85 (*)  Hemoglobin 11.7 (*)    HCT 35.6 (*)    All other components within normal limits  URINALYSIS, W/ REFLEX TO CULTURE (INFECTION SUSPECTED) - Abnormal; Notable for the following components:   Hgb urine dipstick SMALL (*)    Protein, ur 30 (*)    Bacteria, UA RARE (*)    All other components within normal limits  RESP PANEL BY RT-PCR (RSV, FLU A&B, COVID)  RVPGX2  RESPIRATORY PANEL BY PCR  SARS CORONAVIRUS 2 BY RT PCR  HIV ANTIBODY (ROUTINE TESTING W REFLEX)  PROCALCITONIN  I-STAT CG4 LACTIC ACID, ED    EKG EKG Interpretation Date/Time:  Tuesday November 01 2022 09:18:58 EDT Ventricular Rate:  90 PR Interval:  144 QRS Duration:  134 QT Interval:  386 QTC Calculation: 472 R Axis:   102  Text Interpretation: Normal sinus rhythm Right atrial enlargement Non-specific intra-ventricular conduction block Minimal voltage criteria for LVH, may be normal variant ( Cornell product ) Lateral infarct , age undetermined T wave abnormality, consider inferior ischemia Abnormal ECG No significant change since last tracing Confirmed by Elayne Snare ([id number]) on 11/01/2022 10:48:49 AM  Radiology DG Chest 2 View  Result Date: 11/01/2022 CLINICAL DATA:  [id number] Fever [id number].  Cough.  Chest pain. EXAM: CHEST - 2 VIEW COMPARISON:  03/20/2017. FINDINGS: There are heterogeneous opacities in the right lung lower  lobe without significant volume loss, favored to represent pneumonia. Bilateral lung fields are otherwise clear. There is subtle blunting of right costophrenic angle, which may represent underlying trace pleural effusion. Left costophrenic angle is clear. Normal cardio-mediastinal silhouette. No acute osseous abnormalities. The soft tissues are within normal limits. There are multiple surgical staples in the left breast/axillary region. IMPRESSION: 1. Right lower lobe pneumonia. Radiographic follow-up to resolution is recommended. Electronically Signed   By: Jules Schick M.D.   On: 11/01/2022 11:16    Procedures Procedures    Medications Ordered in ED Medications  azithromycin (ZITHROMAX) 500 mg in sodium chloride 0.9 % 250 mL IVPB (has no administration in time range)  nirmatrelvir/ritonavir (PAXLOVID) 3 tablet (has no administration in time range)  ezetimibe (ZETIA) tablet 10 mg (has no administration in time range)  carvedilol (COREG) tablet 6.25 mg (has no administration in time range)  furosemide (LASIX) tablet 40 mg (has no administration in time range)  spironolactone (ALDACTONE) tablet 12.5 mg (has no administration in time range)  sacubitril-valsartan (ENTRESTO) 24-26 mg per tablet (has no administration in time range)  enoxaparin (LOVENOX) injection 40 mg (has no administration in time range)  sodium chloride flush (NS) 0.9 % injection 3 mL (has no administration in time range)  acetaminophen (TYLENOL) tablet 650 mg (has no administration in time range)    Or  acetaminophen (TYLENOL) suppository 650 mg (has no administration in time range)  polyethylene glycol (MIRALAX / GLYCOLAX) packet 17 g (has no administration in time range)  insulin aspart (novoLOG) injection 0-15 Units (has no administration in time range)  dextromethorphan-guaiFENesin (MUCINEX DM) 30-600 MG per 12 hr tablet 1 tablet (has no administration in time range)  acetaminophen (TYLENOL) tablet 650 mg (650 mg Oral  Given 11/01/22 0935)  cefTRIAXone (ROCEPHIN) 1 g in sodium chloride 0.9 % 100 mL IVPB (0 g Intravenous Stopped 11/01/22 1350)    ED Course/ Medical Decision Making/ A&P   This patient presents to the ED with chief complaint(s) of shortness of breath, fever, cough with pertinent past medical history of CHF and sleep apnea which further complicates the presenting complaint. The complaint  involves an extensive differential diagnosis and also carries with it a high risk of complications and morbidity.    The differential diagnosis includes COVID, pneumonia, viral infection, influenza  Additional history obtained: Records reviewed Primary Care Documents  ED Course and Reassessment:  Patient pulse ox dropped to 87 while ambulating.  Contacting hospitalist for admission to monitor oxygen levels.  Independent labs interpretation:  The following labs were independently interpreted: CBC, CMP, respiratory panel, COVID PCR, lactic acid  Independent visualization of imaging: - I independently visualized the following imaging with scope of interpretation limited to determining acute life threatening conditions related to emergency care: Chest x-ray, which revealed right lower lobe opacities favoring pneumonia and blunting of right costophrenic angle.  Consultation: - Consulted or discussed management/test interpretation w/ external professional: Hospitalist for admission  Consideration for admission or further workup: Considered for admission for acute respiratory failure with hypoxia as oxygen sat drop to 87 while ambulating.  Plan for admission.  Click here for ABCD2, HEART and other calculatorsREFRESH Note before signing :1}                              Medical Decision Making Amount and/or Complexity of Data Reviewed Labs: ordered. Radiology: ordered.  Risk OTC drugs. Prescription drug management.          Final Clinical Impression(s) / ED Diagnoses Final diagnoses:  Pneumonia  of right lower lobe due to infectious organism  Acute respiratory failure with hypoxia [facility])    Rx / DC Orders ED Discharge Orders          Ordered    azithromycin (ZITHROMAX) 250 MG tablet  Daily        11/01/22 1300    benzonatate (TESSALON) 100 MG capsule  Every 8 hours        11/01/22 1300              Dolphus Jenny, PA-C 11/01/22 1518    Elayne Snare K, DO 11/01/22 1544

## 2022-11-01 NOTE — H&P (Signed)
History and Physical   [name] FAO:[id number] DOB: 02/28/1955 DOA: 11/01/2022  PCP: Bary Leriche, PA-C   Patient coming from: Home  Chief Complaint: Shortness of breath, cough  HPI: [name] is a 67 y.o. female with medical history significant of hypertension, hyperlipidemia, diabetes, CHF, obesity, OSA, anxiety, breast cancer presenting with 4 days of shortness of breath and cough.  Patient, as above, has had 4 days of shortness of breath with associated cough.  Also reporting some chest pain and bodyaches.  She took a home test for COVID-19 infection 4 days ago which was positive.  She spoke with her PCP office by phone and was started on Paxlovid which she started yesterday.  Symptoms continued to worsen so patient came for further evaluation. Denies chills, abdominal pain, constipation, diarrhea, nausea, vomiting.  ED Course: Vital signs in the ED notable for fever to 101.8, blood pressure in the 110s to 120 systolic, respirate in the 20s.  Saturating okay on room air at rest but desaturated to 87-88% on room air with ambulation.  Lab workup showed sodium 133, glucose 116, albumin 3.2.  CBC with normal WBC, hemoglobin stable 11.7.  Lactic acid normal with repeat pending.  Respiratory panel for flu COVID RSV negative here.  Urinalysis with hemoglobin, protein, rare bacteria only.  Chest x-ray showed right lower lobe pneumonia.  Patient started on ceftriaxone and azithromycin and received a dose of Tylenol in the ED.  Review of Systems: As per HPI otherwise all other systems reviewed and are negative.  Past Medical History:  Diagnosis Date   Anxiety    Aortic stenosis, mild    echo 2017   Breast cancer (HCC) 1996   left breast   Carpal tunnel syndrome    Cataract    Chronic diastolic CHF (congestive heart failure) (HCC)    NYHA class II   COVID-19    12/02/21   Diabetes mellitus without complication (HCC)    Hyperlipidemia    Hypertension    Left bundle branch  block (LBBB)    Low back pain    Neuropathy    NICM (nonischemic cardiomyopathy) (HCC)    EF 30%, cath 05/20/2015 clean coronary.  EF resolved by echo with EF 55%   OSA on CPAP    Prediabetes    Pulmonary HTN (HCC) 05/2015   Severe with PASP 67/45mmHg - resolved on followup echo   PVC's (premature ventricular contractions) 11/04/2015   Sleep apnea    cpap    Past Surgical History:  Procedure Laterality Date   BREAST SURGERY     CARDIAC CATHETERIZATION N/A 05/20/2015   Procedure: Right/Left Heart Cath and Coronary Angiography;  Surgeon: Lennette Bihari, MD;  Location: [facility];  Service: Cardiovascular;  Laterality: N/A;   CATARACT EXTRACTION W/ INTRAOCULAR LENS  IMPLANT, BILATERAL     COLONOSCOPY  2018   DILATION AND CURETTAGE OF UTERUS     lumpectomy left breast     POLYPECTOMY     UTERINE FIBROID SURGERY      Social History  reports that she quit smoking about 13 years ago. Her smoking use included cigarettes. She has never used smokeless tobacco. She reports current alcohol use. She reports that she does not use drugs.  Allergies  Allergen Reactions   Jardiance [Empagliflozin] Anaphylaxis, Itching, Rash and Other (See Comments)    Yeast infection   Atorvastatin Other (See Comments)    REACTION: mylagias   Pravastatin Sodium Other (See Comments)  REACTION: myalgias   Sulfa Antibiotics Hives    Family History  Problem Relation Age of Onset   Arthritis Mother    Depression Mother    Hearing loss Mother    Hyperlipidemia Mother    Miscarriages / [place] Mother    Colon cancer Mother 70       2010   Colon polyps Mother    Alcohol abuse Father    Cancer Father        PROSTATE   Depression Father    Hypertension Father    Hyperlipidemia Father    Drug abuse Brother    Early death Maternal Grandmother    Cancer Paternal Grandmother    Heart attack Neg Hx    Diabetes Neg Hx    Esophageal cancer Neg Hx    Rectal cancer Neg Hx    Stomach cancer Neg  Hx   Reviewed on admission  Prior to Admission medications   Medication Sig Start Date End Date Taking? Authorizing Provider  acetaminophen (TYLENOL) 650 MG CR tablet Take 1,300 mg by mouth as needed for pain.   Yes [provider]  aspirin 81 MG tablet Take 81 mg by mouth daily.   Yes [provider]  azithromycin (ZITHROMAX) 250 MG tablet Take 1 tablet (250 mg total) by mouth daily. Take first 2 tablets together, then 1 every day until finished. 11/01/22  Yes Dolphus Jenny, PA-C  benzonatate (TESSALON) 100 MG capsule Take 1 capsule (100 mg total) by mouth every 8 (eight) hours. 11/01/22  Yes Dolphus Jenny, PA-C  carvedilol (COREG) 6.25 MG tablet TAKE 1 TABLET BY MOUTH TWICE DAILY WITH A MEAL 08/23/22  Yes Turner, Cornelious Bryant, MD  clotrimazole (LOTRIMIN) 1 % cream Apply 1 Application topically 2 (two) times daily. 08/10/21  Yes Willow Ora, MD  diclofenac Sodium (VOLTAREN) 1 % GEL Apply topically 4 (four) times daily. Arthritis   Yes [provider]  diphenhydrAMINE HCl (BENADRYL ALLERGY PO) Take 1 tablet by mouth as needed.   Yes [provider]  ezetimibe (ZETIA) 10 MG tablet Take 1 tablet (10 mg total) by mouth daily. 08/16/22  Yes Turner, Cornelious Bryant, MD  furosemide (LASIX) 40 MG tablet TAKE 1 TO 2 TABLETS BY MOUTH ONCE DAILY AS DIRECTED 12/03/21  Yes Turner, Cornelious Bryant, MD  metFORMIN (GLUCOPHAGE-XR) 500 MG 24 hr tablet 3 tabs qd 05/20/22  Yes Reather Littler, MD  Multiple Vitamin (MULTIVITAMIN) tablet Take 1 tablet by mouth daily.    Yes [provider]  nirmatrelvir/ritonavir (PAXLOVID) 20 x 150 MG & 10 x 100MG  TABS Take 3 tablets by mouth 2 (two) times daily for 5 days. (Take nirmatrelvir 150 mg two tablets twice daily for 5 days and ritonavir 100 mg one tablet twice daily for 5 days) HOLD ROSUVASTATIN WHILE TAKING THIS MEDICATION 10/31/22 11/05/22 Yes Jarold Motto, PA  rosuvastatin (CRESTOR) 5 MG tablet Take 1 tablet by mouth once daily 08/19/22  Yes [name],  [name], PA-C  sacubitril-valsartan (ENTRESTO) 24-26 MG Take 1 tablet by mouth 2 (two) times daily. 01/24/22  Yes Turner, Cornelious Bryant, MD  spironolactone (ALDACTONE) 25 MG tablet TAKE 1/2 (ONE-HALF) TABLET BY MOUTH ONCE DAILY . 08/22/22  Yes Conte, Tessa N, PA-C  vitamin C (ASCORBIC ACID) 500 MG tablet Take 2,000 mg by mouth as needed.    Yes [provider]  Blood Glucose Monitoring Suppl (ONETOUCH VERIO FLEX SYSTEM) w/Device KIT Use onetouch verio flex meter to check blood sugar 3 times weekly,  alternating between fasting and 2 hours after meals. 10/03/19   Reather Littler, MD  glucose blood (ONETOUCH VERIO) test strip USE 1 STRIP TO CHECK BLOOD SUGAR TWO TO THREE TIMES A DAY 08/09/21   Reather Littler, MD  OneTouch Delica Lancets 30G MISC Use OneTouch Delica lancets to check blood sugar 3 times weekly, alternating between fasting and 2 hours after meals. 10/03/19   Reather Littler, MD    Physical Exam: Vitals:   11/01/22 0917 11/01/22 1051 11/01/22 1100  BP: 123/75 121/63 (!) 113/58  Pulse: 93 87 83  Resp: (!) 22 18 (!) 25  Temp: (!) 101.8 F (38.8 C) 98.6 F (37 C)   TempSrc: Oral Oral   SpO2: 97% 93% 93%    Physical Exam Constitutional:      General: She is not in acute distress.    Appearance: Normal appearance.  HENT:     Head: Normocephalic and atraumatic.     Mouth/Throat:     Mouth: Mucous membranes are moist.     Pharynx: Oropharynx is clear.  Eyes:     Extraocular Movements: Extraocular movements intact.     Pupils: Pupils are equal, round, and reactive to light.  Cardiovascular:     Rate and Rhythm: Normal rate and regular rhythm.     Pulses: Normal pulses.     Heart sounds: Normal heart sounds.  Pulmonary:     Effort: Pulmonary effort is normal. No respiratory distress.     Breath sounds: Examination of the right-lower field reveals rhonchi and rales. Rhonchi and rales present.  Abdominal:     General: Bowel sounds are normal. There is no distension.     Palpations:  Abdomen is soft.     Tenderness: There is no abdominal tenderness.  Musculoskeletal:        General: No swelling or deformity.  Skin:    General: Skin is warm and dry.  Neurological:     General: No focal deficit present.     Mental Status: Mental status is at baseline.    Labs on Admission: I have personally reviewed following labs and imaging studies  CBC: Recent Labs  Lab 11/01/22 0927  WBC 9.7  NEUTROABS 7.1  HGB 11.7*  HCT 35.6*  MCV 92.5  PLT 285    Basic Metabolic Panel: Recent Labs  Lab 11/01/22 0927  NA 133*  K 3.5  CL 101  CO2 23  GLUCOSE 116*  BUN 6*  CREATININE 0.64  CALCIUM 8.9    GFR: Estimated Creatinine Clearance: 79.4 mL/min (by C-G formula based on SCr of 0.64 mg/dL).  Liver Function Tests: Recent Labs  Lab 11/01/22 0927  AST 16  ALT 19  ALKPHOS 44  BILITOT 0.6  PROT 6.7  ALBUMIN 3.2*    Urine analysis:    Component Value Date/Time   COLORURINE YELLOW 11/01/2022 0927   APPEARANCEUR CLEAR 11/01/2022 0927   LABSPEC 1.017 11/01/2022 0927   PHURINE 6.0 11/01/2022 0927   GLUCOSEU NEGATIVE 11/01/2022 0927   GLUCOSEU NEGATIVE 05/25/2020 1518   HGBUR SMALL (A) 11/01/2022 0927   BILIRUBINUR NEGATIVE 11/01/2022 0927   KETONESUR NEGATIVE 11/01/2022 0927   PROTEINUR 30 (A) 11/01/2022 0927   UROBILINOGEN 0.2 05/25/2020 1518   NITRITE NEGATIVE 11/01/2022 0927   LEUKOCYTESUR NEGATIVE 11/01/2022 0927    Radiological Exams on Admission: DG Chest 2 View  Result Date: 11/01/2022 CLINICAL DATA:  [id number] Fever [id number].  Cough.  Chest pain. EXAM: CHEST - 2 VIEW COMPARISON:  03/20/2017. FINDINGS: There are  heterogeneous opacities in the right lung lower lobe without significant volume loss, favored to represent pneumonia. Bilateral lung fields are otherwise clear. There is subtle blunting of right costophrenic angle, which may represent underlying trace pleural effusion. Left costophrenic angle is clear. Normal cardio-mediastinal silhouette. No  acute osseous abnormalities. The soft tissues are within normal limits. There are multiple surgical staples in the left breast/axillary region. IMPRESSION: 1. Right lower lobe pneumonia. Radiographic follow-up to resolution is recommended. Electronically Signed   By: Jules Schick M.D.   On: 11/01/2022 11:16    EKG: Independently reviewed.  Sinus rhythm at 90 bpm.  Bundle branch block with QRS 134, LVH with repolarization abnormality/J-point elevation.  Nonspecific T wave changes.  Similar to previous Assessment/Plan Principal Problem:   Acute respiratory failure with hypoxia (HCC) Active Problems:   Mixed hyperlipidemia   OSA (obstructive sleep apnea)   Chronic combined systolic and diastolic CHF (congestive heart failure) (HCC)   Morbid obesity (HCC)   Hypertensive heart disease   Type 2 diabetes mellitus without complications (HCC)   PNA (pneumonia)   Pneumonia ?  COVID-19 infection Hypoxic respiratory failure > Patient presenting with 4 days of shortness of breath and cough with some associated chest pain and bodyaches. > Positive home test for COVID-19 but negative here.  Will likely need a third test to confirm. > Chest x-ray with right lower lobe pneumonia.  No leukocytosis.  Likely does indicate viral infection but did receive ceftriaxone and azithromycin in the ED to cover for possible bacterial superinfection. > To further evaluate for viral versus bacterial etiology we will check full respiratory viral panel in addition to repeat COVID screen.  Will also check procalcitonin. > Desaturated with ambulation in the ED down to 87-88%. - Monitor on telemetry overnight with continuous pulse ox - Continue with supplemental oxygen, wean as tolerated - Hold off on further antibiotics pending further evaluation this evening - Follow-up RVP and repeat COVID screen - Trend fever curve WBC - Supportive care - Plan for steroids if repeat COVID-19 positive - Will continue with outpatient  initiated Paxlovid  Hypertension - Continue home Coreg, Lasix, Entresto, spironolactone  Hyperlipidemia - Continue home Zetia - Holding rosuvastatin while on Paxlovid  Diabetes - SSI  Chronic combined systolic and diastolic CHF > Last echo was earlier this year with EF 45-50%, G1 DD, normal RV function. - Continue home Coreg, Lasix, spironolactone, Entresto  OSA - Continue home CPAP  Obesity - Noted  History of breast cancer - Noted  DVT prophylaxis: Lovenox Code Status:   Full Family Communication:  None on admission  Disposition Plan:   Patient is from:  Home  Anticipated DC to:  Home  Anticipated DC date:  1 to 2 days  Anticipated DC barriers: None  Consults called:  None Admission status:  Observation, telemetry  Severity of Illness: The appropriate patient status for this patient is OBSERVATION. Observation status is judged to be reasonable and necessary in order to provide the required intensity of service to ensure the patient's safety. The patient's presenting symptoms, physical exam findings, and initial radiographic and laboratory data in the context of their medical condition is felt to place them at decreased risk for further clinical deterioration. Furthermore, it is anticipated that the patient will be medically stable for discharge from the hospital within 2 midnights of admission.    Synetta Fail MD [facility]  How to contact the [facility] Attending or Consulting provider 7A - 7P or covering provider during  after hours 7P -7A, for this patient?   Check the care team in [facility] and look for a) attending/consulting [facility] provider listed and b) the [facility] team listed Log into [email or web address] and use [facility] universal password to access. If you do not have the password, please contact the hospital operator. Locate the [facility] provider you are looking for under [facility] and page to a number that you can be directly reached. If you still have  difficulty reaching the provider, please page the [facility] (Director on Call) for the Hospitalists listed on amion for assistance.  11/01/2022, 3:12 PM

## 2022-11-01 NOTE — ED Triage Notes (Signed)
Pt. Stated, [name] been sick since Friday and I did a home test which was positive for COVID. Ive had a cough, chest pain, bodyaches. I called the Dr. And he put me on Paxovid . But I need a Dr. To see me.

## 2022-11-01 NOTE — ED Notes (Signed)
ED TO INPATIENT HANDOFF REPORT  ED Nurse Name and Phone #: Mignon Pine RN, MSN [phone]  S Name/Age/Gender [name] 67 y.o. female Room/Bed: 045C/045C  Code Status   Code Status: Full Code  Home/SNF/Other Home Patient oriented to: self, person, time, situation. GCS 15. Is this baseline? Yes   Triage Complete: Triage complete  Chief Complaint Acute respiratory failure with hypoxia (HCC) [J96.01]  Triage Note Pt. Stated, Lavenia Atlas been sick since Friday and I did a home test which was positive for COVID. Ive had a cough, chest pain, bodyaches. I called the Dr. And he put me on Paxovid . But I need a Dr. To see me.   Allergies Allergies  Allergen Reactions   Jardiance [Empagliflozin] Anaphylaxis, Itching, Rash and Other (See Comments)    Yeast infection   Atorvastatin Other (See Comments)    REACTION: mylagias   Pravastatin Sodium Other (See Comments)    REACTION: myalgias   Sulfa Antibiotics Hives    Level of Care/Admitting Diagnosis ED Disposition     ED Disposition  Admit   Condition  --   Comment  Hospital Area: [facility] [[id number]]  Level of Care: Telemetry Medical [104]  May place patient in observation at [facility] or [facility] if equivalent level of care is available:: No  Covid Evaluation: Symptomatic Person Under Investigation (PUI) or recent exposure (last 10 days) *Testing Required*  Diagnosis: Acute respiratory failure with hypoxia [facility]) [[id number]]  Admitting Physician: Synetta Fail [[id number]]  Attending Physician: Synetta Fail [[id number]]          B Medical/Surgery History Past Medical History:  Diagnosis Date   Anxiety    Aortic stenosis, mild    echo 2017   Breast cancer (HCC) 1996   left breast   Carpal tunnel syndrome    Cataract    Chronic diastolic CHF (congestive heart failure) (HCC)    NYHA class II   COVID-19    12/02/21   Diabetes mellitus without complication (HCC)    Hyperlipidemia     Hypertension    Left bundle branch block (LBBB)    Low back pain    Neuropathy    NICM (nonischemic cardiomyopathy) (HCC)    EF 30%, cath 05/20/2015 clean coronary.  EF resolved by echo with EF 55%   OSA on CPAP    Prediabetes    Pulmonary HTN (HCC) 05/2015   Severe with PASP 67/54mmHg - resolved on followup echo   PVC's (premature ventricular contractions) 11/04/2015   Sleep apnea    cpap   Past Surgical History:  Procedure Laterality Date   BREAST SURGERY     CARDIAC CATHETERIZATION N/A 05/20/2015   Procedure: Right/Left Heart Cath and Coronary Angiography;  Surgeon: Lennette Bihari, MD;  Location: [facility] INVASIVE CV LAB;  Service: Cardiovascular;  Laterality: N/A;   CATARACT EXTRACTION W/ INTRAOCULAR LENS  IMPLANT, BILATERAL     COLONOSCOPY  2018   DILATION AND CURETTAGE OF UTERUS     lumpectomy left breast     POLYPECTOMY     UTERINE FIBROID SURGERY       A IV Location/Drains/Wounds Patient Lines/Drains/Airways Status     Active Line/Drains/Airways     Name Placement date Placement time Site Days   Peripheral IV 11/01/22 20 G Left Antecubital 11/01/22  1108  Antecubital  less than 1            Intake/Output Last 24 hours No intake or output data in the  24 hours ending 11/01/22 2139  Labs/Imaging Results for orders placed or performed during the hospital encounter of 11/01/22 (from the past 48 hour(s))  Comprehensive metabolic panel     Status: Abnormal   Collection Time: 11/01/22  9:27 AM  Result Value Ref Range   Sodium 133 (L) 135 - 145 mmol/L   Potassium 3.5 3.5 - 5.1 mmol/L   Chloride 101 98 - 111 mmol/L   CO2 23 22 - 32 mmol/L   Glucose, Bld 116 (H) 70 - 99 mg/dL    Comment: Glucose reference range applies only to samples taken after fasting for at least 8 hours.   BUN 6 (L) 8 - 23 mg/dL   Creatinine, Ser 6.96 0.44 - 1.00 mg/dL   Calcium 8.9 8.9 - 29.5 mg/dL   Total Protein 6.7 6.5 - 8.1 g/dL   Albumin 3.2 (L) 3.5 - 5.0 g/dL   AST 16 15 - 41 U/L   ALT 19  0 - 44 U/L   Alkaline Phosphatase 44 38 - 126 U/L   Total Bilirubin 0.6 0.3 - 1.2 mg/dL   GFR, Estimated >28 >41 mL/min    Comment: (NOTE) Calculated using the CKD-EPI Creatinine Equation (2021)    Anion gap 9 5 - 15    Comment: Performed at [facility], [address] [place], [place] [address]  CBC with Differential     Status: Abnormal   Collection Time: 11/01/22  9:27 AM  Result Value Ref Range   WBC 9.7 4.0 - 10.5 K/uL   RBC 3.85 (L) 3.87 - 5.11 MIL/uL   Hemoglobin 11.7 (L) 12.0 - 15.0 g/dL   HCT 10.2 (L) 72.5 - 36.6 %   MCV 92.5 80.0 - 100.0 fL   MCH 30.4 26.0 - 34.0 pg   MCHC 32.9 30.0 - 36.0 g/dL   RDW 44.0 34.7 - 42.5 %   Platelets 285 150 - 400 K/uL   nRBC 0.0 0.0 - 0.2 %   Neutrophils Relative % 74 %   Neutro Abs 7.1 1.7 - 7.7 K/uL   Lymphocytes Relative 17 %   Lymphs Abs 1.7 0.7 - 4.0 K/uL   Monocytes Relative 8 %   Monocytes Absolute 0.8 0.1 - 1.0 K/uL   Eosinophils Relative 1 %   Eosinophils Absolute 0.1 0.0 - 0.5 K/uL   Basophils Relative 0 %   Basophils Absolute 0.0 0.0 - 0.1 K/uL   Immature Granulocytes 0 %   Abs Immature Granulocytes 0.03 0.00 - 0.07 K/uL    Comment: Performed at [facility], [address], [place], [place] [address]  Urinalysis, w/ Reflex to Culture (Infection Suspected) -Urine, Clean Catch     Status: Abnormal   Collection Time: 11/01/22  9:27 AM  Result Value Ref Range   Specimen Source URINE, CLEAN CATCH    Color, Urine YELLOW YELLOW   APPearance CLEAR CLEAR   Specific Gravity, Urine 1.017 1.005 - 1.030   pH 6.0 5.0 - 8.0   Glucose, UA NEGATIVE NEGATIVE mg/dL   Hgb urine dipstick SMALL (A) NEGATIVE   Bilirubin Urine NEGATIVE NEGATIVE   Ketones, ur NEGATIVE NEGATIVE mg/dL   Protein, ur 30 (A) NEGATIVE mg/dL   Nitrite NEGATIVE NEGATIVE   Leukocytes,Ua NEGATIVE NEGATIVE   RBC / HPF 21-50 0 - 5 RBC/hpf   WBC, UA 0-5 0 - 5 WBC/hpf    Comment:        Reflex urine culture not performed if WBC <=10, OR if Squamous  epithelial cells >5. If Squamous epithelial cells >5 suggest recollection.    Bacteria, UA RARE (A) NONE SEEN   Squamous Epithelial / HPF 0-5 0 - 5 /HPF   Mucus PRESENT     Comment: Performed at [facility], [address], [place], [place] [address]  Resp panel by RT-PCR (RSV, Flu A&B, Covid) Anterior Nasal Swab     Status: None   Collection Time: 11/01/22  9:28 AM   Specimen: Anterior Nasal Swab  Result Value Ref Range   SARS Coronavirus 2 by RT PCR NEGATIVE NEGATIVE   Influenza A by PCR NEGATIVE NEGATIVE   Influenza B by PCR NEGATIVE NEGATIVE    Comment: (NOTE) The Xpert Xpress SARS-CoV-2/FLU/RSV plus assay is intended as an aid in the diagnosis of influenza from Nasopharyngeal swab specimens and should not be used as a sole basis for treatment. Nasal washings and aspirates are unacceptable for Xpert Xpress SARS-CoV-2/FLU/RSV testing.  Fact Sheet for Patients: BloggerCourse.com  Fact Sheet for Healthcare Providers: SeriousBroker.it  This test is not yet approved or cleared by the [place] FDA and has been authorized for detection and/or diagnosis of SARS-CoV-2 by FDA under an Emergency Use Authorization (EUA). This EUA will remain in effect (meaning this test can be used) for the duration of the COVID-19 declaration under Section 564(b)(1) of the Act, 21 U.S.C. section 360bbb-3(b)(1), unless the authorization is terminated or revoked.     Resp Syncytial Virus by PCR NEGATIVE NEGATIVE    Comment: (NOTE) Fact Sheet for Patients: BloggerCourse.com  Fact Sheet for Healthcare Providers: SeriousBroker.it  This test is not yet approved or cleared by the [place] FDA and has been authorized for detection and/or diagnosis of SARS-CoV-2 by FDA under an Emergency Use Authorization (EUA). This EUA will remain in effect (meaning this test can be used) for the  duration of the COVID-19 declaration under Section 564(b)(1) of the Act, 21 U.S.C. section 360bbb-3(b)(1), unless the authorization is terminated or revoked.  Performed at [facility], [address], [place], [place] [address]   I-Stat Lactic Acid, ED     Status: None   Collection Time: 11/01/22  9:39 AM  Result Value Ref Range   Lactic Acid, Venous 0.6 0.5 - 1.9 mmol/L  Respiratory (~20 pathogens) panel by PCR     Status: Abnormal   Collection Time: 11/01/22  2:47 PM   Specimen: Nasopharyngeal Swab; Respiratory  Result Value Ref Range   Adenovirus NOT DETECTED NOT DETECTED   Coronavirus 229E NOT DETECTED NOT DETECTED    Comment: (NOTE) The Coronavirus on the Respiratory Panel, DOES NOT test for the novel  Coronavirus (2019 nCoV)    Coronavirus HKU1 NOT DETECTED NOT DETECTED   Coronavirus NL63 NOT DETECTED NOT DETECTED   Coronavirus OC43 NOT DETECTED NOT DETECTED   Metapneumovirus NOT DETECTED NOT DETECTED   Rhinovirus / Enterovirus NOT DETECTED NOT DETECTED   Influenza A NOT DETECTED NOT DETECTED   Influenza B NOT DETECTED NOT DETECTED   Parainfluenza Virus 1 NOT DETECTED NOT DETECTED   Parainfluenza Virus 2 NOT DETECTED NOT DETECTED   Parainfluenza Virus 3 NOT DETECTED NOT DETECTED   Parainfluenza Virus 4 NOT DETECTED NOT DETECTED   Respiratory Syncytial Virus NOT DETECTED NOT DETECTED   Bordetella pertussis NOT DETECTED NOT DETECTED   Bordetella Parapertussis NOT DETECTED NOT DETECTED   Chlamydophila pneumoniae NOT DETECTED NOT DETECTED   Mycoplasma pneumoniae DETECTED (A) NOT DETECTED    Comment: Performed at [facility]  Lab, [address], [place], [place] [address]  SARS Coronavirus 2 by RT PCR (hospital order, performed in [facility] hospital lab) *cepheid single result test* Anterior Nasal Swab     Status: None   Collection Time: 11/01/22  2:56 PM   Specimen: Anterior Nasal Swab  Result Value Ref Range   SARS Coronavirus 2 by RT PCR NEGATIVE NEGATIVE     Comment: Performed at [facility], [address], [place], [place] [address]  CBG monitoring, ED     Status: Abnormal   Collection Time: 11/01/22  5:01 PM  Result Value Ref Range   Glucose-Capillary 129 (H) 70 - 99 mg/dL    Comment: Glucose reference range applies only to samples taken after fasting for at least 8 hours.  Procalcitonin     Status: None   Collection Time: 11/01/22  5:41 PM  Result Value Ref Range   Procalcitonin <0.10 ng/mL    Comment:        Interpretation: PCT (Procalcitonin) <= 0.5 ng/mL: Systemic infection (sepsis) is not likely. Local bacterial infection is possible. (NOTE)       Sepsis PCT Algorithm           Lower Respiratory Tract                                      Infection PCT Algorithm    ----------------------------     ----------------------------         PCT < 0.25 ng/mL                PCT < 0.10 ng/mL          Strongly encourage             Strongly discourage   discontinuation of antibiotics    initiation of antibiotics    ----------------------------     -----------------------------       PCT 0.25 - 0.50 ng/mL            PCT 0.10 - 0.25 ng/mL               OR       >80% decrease in PCT            Discourage initiation of                                            antibiotics      Encourage discontinuation           of antibiotics    ----------------------------     -----------------------------         PCT >= 0.50 ng/mL              PCT 0.26 - 0.50 ng/mL               AND        <80% decrease in PCT             Encourage initiation of                                             antibiotics       Encourage continuation  of antibiotics    ----------------------------     -----------------------------        PCT >= 0.50 ng/mL                  PCT > 0.50 ng/mL               AND         increase in PCT                  Strongly encourage                                      initiation of antibiotics    Strongly encourage  escalation           of antibiotics                                     -----------------------------                                           PCT <= 0.25 ng/mL                                                 OR                                        > 80% decrease in PCT                                      Discontinue / Do not initiate                                             antibiotics  Performed at [facility], [address], [place], [place] [address]    DG Chest 2 View  Result Date: 11/01/2022 CLINICAL DATA:  [id number] Fever [phone].  Cough.  Chest pain. EXAM: CHEST - 2 VIEW COMPARISON:  03/20/2017. FINDINGS: There are heterogeneous opacities in the right lung lower lobe without significant volume loss, favored to represent pneumonia. Bilateral lung fields are otherwise clear. There is subtle blunting of right costophrenic angle, which may represent underlying trace pleural effusion. Left costophrenic angle is clear. Normal cardio-mediastinal silhouette. No acute osseous abnormalities. The soft tissues are within normal limits. There are multiple surgical staples in the left breast/axillary region. IMPRESSION: 1. Right lower lobe pneumonia. Radiographic follow-up to resolution is recommended. Electronically Signed   By: Jules Schick M.D.   On: 11/01/2022 11:16    Pending Labs Unresulted Labs (From admission, onward)     Start     Ordered   11/08/22 0500  Creatinine, serum  (enoxaparin (LOVENOX)    CrCl >/= 30 ml/min)  Weekly,   R     Comments: while on enoxaparin therapy  11/01/22 1456   11/02/22 0500  Comprehensive metabolic panel  Tomorrow morning,   R        11/01/22 1456   11/02/22 0500  CBC  Tomorrow morning,   R        11/01/22 1456   11/01/22 1900  HIV Antibody (routine testing w rflx)  Once,   R        11/01/22 1900   11/01/22 1457  Procalcitonin  Daily,   R     References:    Procalcitonin Lower Respiratory Tract Infection AND Sepsis Procalcitonin  Algorithm   11/01/22 1456            Vitals/Pain Today's Vitals   11/01/22 1730 11/01/22 1800 11/01/22 2023 11/01/22 2024  BP: (!) 107/54 (!) 137/90  125/69  Pulse: 92 90  92  Resp: (!) 23 (!) 30  20  Temp:    98.6 F (37 C)  TempSrc:    Oral  SpO2: 96% 94%  93%  PainSc:   5      Isolation Precautions Airborne and Contact precautions  Medications Medications  nirmatrelvir/ritonavir (PAXLOVID) 3 tablet (has no administration in time range)  ezetimibe (ZETIA) tablet 10 mg (has no administration in time range)  carvedilol (COREG) tablet 6.25 mg (6.25 mg Oral Given 11/01/22 1638)  furosemide (LASIX) tablet 40 mg (40 mg Oral Patient Refused/Not Given 11/01/22 1725)  spironolactone (ALDACTONE) tablet 12.5 mg (has no administration in time range)  sacubitril-valsartan (ENTRESTO) 24-26 mg per tablet (has no administration in time range)  enoxaparin (LOVENOX) injection 40 mg (has no administration in time range)  sodium chloride flush (NS) 0.9 % injection 3 mL (3 mLs Intravenous Given 11/01/22 1535)  acetaminophen (TYLENOL) tablet 650 mg (650 mg Oral Given 11/01/22 2029)    Or  acetaminophen (TYLENOL) suppository 650 mg ( Rectal See Alternative 11/01/22 2029)  polyethylene glycol (MIRALAX / GLYCOLAX) packet 17 g (has no administration in time range)  insulin aspart (novoLOG) injection 0-15 Units (2 Units Subcutaneous Given 11/01/22 1722)  dextromethorphan-guaiFENesin (MUCINEX DM) 30-600 MG per 12 hr tablet 1 tablet (1 tablet Oral Given 11/01/22 1600)  feeding supplement (ENSURE ENLIVE / ENSURE PLUS) liquid 237 mL (has no administration in time range)  acetaminophen (TYLENOL) tablet 650 mg (650 mg Oral Given 11/01/22 0935)  cefTRIAXone (ROCEPHIN) 1 g in sodium chloride 0.9 % 100 mL IVPB (0 g Intravenous Stopped 11/01/22 1350)  azithromycin (ZITHROMAX) 500 mg in sodium chloride 0.9 % 250 mL IVPB (0 mg Intravenous Stopped 11/01/22 1722)    Mobility walks     Focused  Assessments    R Recommendations: See Admitting Provider Note  Report given to:   Additional Notes:

## 2022-11-01 NOTE — Discharge Instructions (Addendum)
May return to work on November 08, 2022.

## 2022-11-01 NOTE — ED Provider Notes (Signed)
Patient seen in conjunction with orienting PA Mellody Dance  In summation 67 year old here for evaluation of shortness of breath and cough.  She cannot home COVID test which is positive, subsequently starting Paxlovid.  Feeling worse.  Has a history of heart failure however does not appear grossly fluid overloaded.  Some rales at bases.  On arrival febrile, tachypneic, however no hypoxia.  Defervesced with Tylenol.  Labs and imaging personally viewed and interpreted:  CBC without leukocytosis Metabolic panel without significant abnormality COVID, flu negative Lactic acid 0.6 Chest x-ray shows a right lower lobe pneumonia  Patient assessed at bedside.  Oxygen 93% on room air just sitting there.  She was ambulated by nursing staff, dropped to 87% while ambulating.  She is able to recover to the low 90s however due to her hypoxia will admit for acute hypoxic respiratory failure likely due to pneumonia.  She was given Rocephin as well as Zithromycin.  Consult with hospitalist Dr. Alinda Money who is agreeable to evaluate patient for admission  The patient appears reasonably stabilized for admission considering the current resources, flow, and capabilities available in the ED at this time, and I doubt any other [facility] requiring further screening and/or treatment in the ED prior to admission.   CRITICAL CARE Performed by: Linwood Dibbles Total critical care time: 32 minutes Critical care time was exclusive of separately billable procedures and treating other patients. Critical care was necessary to treat or prevent imminent or life-threatening deterioration. Critical care was time spent personally by me on the following activities: development of treatment plan with patient and/or surrogate as well as nursing, discussions with consultants, evaluation of patient's response to treatment, examination of patient, obtaining history from patient or surrogate, ordering and performing treatments and interventions,  ordering and review of laboratory studies, ordering and review of radiographic studies, pulse oximetry and re-evaluation of patient's condition.      Harlem Thresher A, PA-C 11/01/22 1459    Rexford Maus, DO 11/01/22 1544

## 2022-11-01 NOTE — ED Notes (Signed)
Report received from Keenan Bachelor RN. Assumed care of pt at this time.

## 2022-11-01 NOTE — ED Notes (Signed)
Pt 02 was 88 while walking and went back up to 93 after sitting down.

## 2022-11-01 NOTE — ED Notes (Signed)
Pt refuses to be transported with cardiac leads on. Pt transported to floor accompanied by RN.

## 2022-11-02 ENCOUNTER — Other Ambulatory Visit (HOSPITAL_COMMUNITY): Payer: Self-pay

## 2022-11-02 DIAGNOSIS — J9601 Acute respiratory failure with hypoxia: Secondary | ICD-10-CM | POA: Diagnosis not present

## 2022-11-02 LAB — GLUCOSE, CAPILLARY
Glucose-Capillary: 112 mg/dL — ABNORMAL HIGH (ref 70–99)
Glucose-Capillary: 112 mg/dL — ABNORMAL HIGH (ref 70–99)
Glucose-Capillary: 131 mg/dL — ABNORMAL HIGH (ref 70–99)
Glucose-Capillary: 89 mg/dL (ref 70–99)

## 2022-11-02 MED ORDER — AZITHROMYCIN 250 MG PO TABS
250.0000 mg | ORAL_TABLET | Freq: Every day | ORAL | 0 refills | Status: AC
  Filled 2022-11-02: qty 3, 3d supply, fill #0

## 2022-11-02 MED ORDER — BENZONATATE 200 MG PO CAPS
200.0000 mg | ORAL_CAPSULE | Freq: Three times a day (TID) | ORAL | 0 refills | Status: DC | PRN
Start: 2022-11-02 — End: 2022-12-02
  Filled 2022-11-02: qty 20, 7d supply, fill #0

## 2022-11-02 MED ORDER — BENZONATATE 100 MG PO CAPS
200.0000 mg | ORAL_CAPSULE | Freq: Three times a day (TID) | ORAL | Status: DC | PRN
  Administered 2022-11-02: 200 mg via ORAL
  Filled 2022-11-02: qty 2

## 2022-11-02 MED ORDER — POTASSIUM CHLORIDE CRYS ER 20 MEQ PO TBCR
40.0000 meq | EXTENDED_RELEASE_TABLET | Freq: Once | ORAL | Status: AC
  Administered 2022-11-02: 40 meq via ORAL
  Filled 2022-11-02: qty 2

## 2022-11-02 MED ORDER — DM-GUAIFENESIN ER 30-600 MG PO TB12
1.0000 | ORAL_TABLET | Freq: Two times a day (BID) | ORAL | 0 refills | Status: AC
Start: 2022-11-02 — End: 2022-11-09
  Filled 2022-11-02: qty 14, 7d supply, fill #0

## 2022-11-02 MED ORDER — AZITHROMYCIN 500 MG PO TABS
250.0000 mg | ORAL_TABLET | Freq: Every day | ORAL | Status: DC
  Administered 2022-11-02: 250 mg via ORAL
  Filled 2022-11-02: qty 1

## 2022-11-02 MED ORDER — POTASSIUM CHLORIDE CRYS ER 20 MEQ PO TBCR
20.0000 meq | EXTENDED_RELEASE_TABLET | Freq: Once | ORAL | Status: DC
Start: 2022-11-02 — End: 2022-11-02

## 2022-11-02 NOTE — Plan of Care (Signed)

## 2022-11-02 NOTE — Discharge Summary (Signed)
Physician Discharge Summary  [name] UXL:[id number] DOB: 1955-04-05 DOA: 11/01/2022  PCP: Bary Leriche, PA-C  Admit date: 11/01/2022 Discharge date: 11/02/2022  Admitted From: Home Disposition:  Home  Discharge Condition:Stable CODE STATUS:FULL Diet recommendation: Heart Healthy   Brief/Interim Summary: Patient  is a 67 y.o. female with medical history significant of hypertension, hyperlipidemia, diabetes, CHF, obesity, OSA, anxiety, breast cancer presenting with 4 days of shortness of breath and cough. Also reported some chest pain and bodyaches.  She took a home test for COVID-19 infection 4 days ago which was positive.  She spoke with her PCP office by phone and was started on Paxlovid .  On presentation, she was febrile, tachycardic.  Flu/COVID found to be negative.  CXR right lower lobe pneumonia.  Respiratory viral panel positive for mycoplasma.  Clinically she has significantly improved.  This morning she is on room air.  She is currently on ceftriaxone.  Medically stable for discharge today with oral antibiotic.   Following problems were addressed during the hospitalization:  Pneumonia/Acute hypoxic respiratory failure > Patient presenting with 4 days of shortness of breath and cough with some associated chest pain and bodyaches. > Chest x-ray with right lower lobe pneumonia.  No leukocytosis.   Repeat COVID test negative.  Paxlovid  was discontinued.  Respiratory viral panel showed mycoplasma.  Continue azithromycin.  On room air this morning  Hypertension - Continue home Coreg, Lasix, Entresto, spironolactone   Hyperlipidemia - Continue home Zetia, statin   Diabetes -Continue home regimen   Chronic combined systolic and diastolic CHF > Last echo was earlier this year with EF 45-50%, G1 DD, normal RV function. - Continue home Coreg, Lasix, spironolactone, Entresto -Currently euvolemic   OSA - Continue home CPAP   Obesity - Noted  Discharge Diagnoses:   Principal Problem:   Acute respiratory failure with hypoxia (HCC) Active Problems:   Mixed hyperlipidemia   OSA (obstructive sleep apnea)   Chronic combined systolic and diastolic CHF (congestive heart failure) (HCC)   Morbid obesity (HCC)   Hypertensive heart disease   Type 2 diabetes mellitus without complications (HCC)   PNA (pneumonia)    Discharge Instructions  Discharge Instructions     Diet - low sodium heart healthy   Complete by: As directed    Discharge instructions   Complete by: As directed    1)Please take prescribed medications as instructed 2)Follow up with your PCP in a week   Increase activity slowly   Complete by: As directed       Allergies as of 11/02/2022       Reactions   Jardiance [empagliflozin] Anaphylaxis, Itching, Rash, Other (See Comments)   Yeast infection   Atorvastatin Other (See Comments)   REACTION: mylagias   Pravastatin Sodium Other (See Comments)   REACTION: myalgias   Sulfa Antibiotics Hives        Medication List     STOP taking these medications    nirmatrelvir/ritonavir 20 x 150 MG & 10 x 100MG  Tabs Commonly known as: PAXLOVID       TAKE these medications    acetaminophen 650 MG CR tablet Commonly known as: TYLENOL Take 1,300 mg by mouth as needed for pain.   ascorbic acid 500 MG tablet Commonly known as: VITAMIN C Take 2,000 mg by mouth as needed.   aspirin 81 MG tablet Take 81 mg by mouth daily.   azithromycin 250 MG tablet Commonly known as: ZITHROMAX Take 1 tablet (250 mg total) by mouth  daily for 3 days. Start taking on: November 03, 2022   BENADRYL ALLERGY PO Take 1 tablet by mouth as needed.   benzonatate 200 MG capsule Commonly known as: TESSALON Take 1 capsule (200 mg total) by mouth 3 (three) times daily as needed for cough.   carvedilol 6.25 MG tablet Commonly known as: COREG TAKE 1 TABLET BY MOUTH TWICE DAILY WITH A MEAL   clotrimazole 1 % cream Commonly known as: LOTRIMIN Apply 1  Application topically 2 (two) times daily.   dextromethorphan-guaiFENesin 30-600 MG 12hr tablet Commonly known as: MUCINEX DM Take 1 tablet by mouth 2 (two) times daily for 7 days.   diclofenac Sodium 1 % Gel Commonly known as: VOLTAREN Apply topically 4 (four) times daily. Arthritis   Entresto 24-26 MG Generic drug: sacubitril-valsartan Take 1 tablet by mouth 2 (two) times daily.   ezetimibe 10 MG tablet Commonly known as: ZETIA Take 1 tablet (10 mg total) by mouth daily.   furosemide 40 MG tablet Commonly known as: LASIX TAKE 1 TO 2 TABLETS BY MOUTH ONCE DAILY AS DIRECTED   metFORMIN 500 MG 24 hr tablet Commonly known as: GLUCOPHAGE-XR 3 tabs qd   multivitamin tablet Take 1 tablet by mouth daily.   OneTouch Delica Lancets 30G Misc Use OneTouch Delica lancets to check blood sugar 3 times weekly, alternating between fasting and 2 hours after meals.   OneTouch Verio Flex System w/Device Kit Use onetouch verio flex meter to check blood sugar 3 times weekly, alternating between fasting and 2 hours after meals.   OneTouch Verio test strip Generic drug: glucose blood USE 1 STRIP TO CHECK BLOOD SUGAR TWO TO THREE TIMES A DAY   rosuvastatin 5 MG tablet Commonly known as: CRESTOR Take 1 tablet by mouth once daily   spironolactone 25 MG tablet Commonly known as: ALDACTONE TAKE 1/2 (ONE-HALF) TABLET BY MOUTH ONCE DAILY .        Follow-up Information     [name], [name], PA-C. Schedule an appointment as soon as possible for a visit in 1 week(s).   Specialty: Physician Assistant Contact information: [address] [place] [address] [phone]                Allergies  Allergen Reactions   Jardiance [Empagliflozin] Anaphylaxis, Itching, Rash and Other (See Comments)    Yeast infection   Atorvastatin Other (See Comments)    REACTION: mylagias   Pravastatin Sodium Other (See Comments)    REACTION: myalgias   Sulfa Antibiotics Hives     Consultations: None   Procedures/Studies: DG Chest 2 View  Result Date: 11/01/2022 CLINICAL DATA:  [id number] Fever [id number].  Cough.  Chest pain. EXAM: CHEST - 2 VIEW COMPARISON:  03/20/2017. FINDINGS: There are heterogeneous opacities in the right lung lower lobe without significant volume loss, favored to represent pneumonia. Bilateral lung fields are otherwise clear. There is subtle blunting of right costophrenic angle, which may represent underlying trace pleural effusion. Left costophrenic angle is clear. Normal cardio-mediastinal silhouette. No acute osseous abnormalities. The soft tissues are within normal limits. There are multiple surgical staples in the left breast/axillary region. IMPRESSION: 1. Right lower lobe pneumonia. Radiographic follow-up to resolution is recommended. Electronically Signed   By: Jules Schick M.D.   On: 11/01/2022 11:16   VAS [place] ABI WITH/WO TBI  Result Date: 10/26/2022  LOWER EXTREMITY DOPPLER STUDY Patient Name:  AHRI STEGENGA  Date of Exam:   10/26/2022 Medical Rec #: [id number]  Accession #:    [id number] Date of Birth: [birth year]       Patient Gender: F Patient Age:   1 years Exam Location:  [facility] Procedure:      VAS [place] ABI WITH/WO TBI Referring Phys: Coral Else --------------------------------------------------------------------------------  Indications: Peripheral artery disease. High Risk Factors: Hypertension, hyperlipidemia, Diabetes.  Performing Technologist: [place] Ponder RVS  Examination Guidelines: A complete evaluation includes at minimum, Doppler waveform signals and systolic blood pressure reading at the level of bilateral brachial, anterior tibial, and posterior tibial arteries, when vessel segments are accessible. Bilateral testing is considered an integral part of a complete examination. Photoelectric Plethysmograph (PPG) waveforms and toe systolic pressure readings are included as required and additional duplex  testing as needed. Limited examinations for reoccurring indications may be performed as noted.  ABI Findings: +---------+------------------+-----+-----------+--------+ Right    Rt Pressure (mmHg)IndexWaveform   Comment  +---------+------------------+-----+-----------+--------+ Brachial 116                                        +---------+------------------+-----+-----------+--------+ PTA      120               1.03                     +---------+------------------+-----+-----------+--------+ PERO                            multiphasic         +---------+------------------+-----+-----------+--------+ DP       110               0.95 multiphasic         +---------+------------------+-----+-----------+--------+ Great Toe96                0.83 Normal              +---------+------------------+-----+-----------+--------+ +---------+------------------+-----+-----------+-------+ Left     Lt Pressure (mmHg)IndexWaveform   Comment +---------+------------------+-----+-----------+-------+ PTA      121               1.04 multiphasic        +---------+------------------+-----+-----------+-------+ DP       120               1.03 multiphasic        +---------+------------------+-----+-----------+-------+ Great Toe105               0.91 Normal             +---------+------------------+-----+-----------+-------+ +-------+-----------+-----------+------------+------------+ ABI/TBIToday's ABIToday's TBIPrevious ABIPrevious TBI +-------+-----------+-----------+------------+------------+ Right  1.03       0.83                                +-------+-----------+-----------+------------+------------+ Left   1.04       0.91                                +-------+-----------+-----------+------------+------------+  Summary: Right: Resting right ankle-brachial index is within normal range. The right toe-brachial index is normal. Left: Resting left ankle-brachial  index is within normal range. The left toe-brachial index is normal. *See table(s) above for measurements and observations.  Electronically signed by Lemar Livings MD on 10/26/2022 at 5:30:16 PM.    Final  Subjective: Patient seen and examined at bedside today.  On room air, denies any worsening shortness of breath or cough.  Hemodynamically stable for discharge.  Discharge Exam: Vitals:   11/01/22 2358 11/02/22 0416  BP:  135/82  Pulse: 90 90  Resp: 18 18  Temp:  99 F (37.2 C)  SpO2: 93% 96%   Vitals:   11/01/22 2024 11/01/22 2328 11/01/22 2358 11/02/22 0416  BP: 125/69 114/66  135/82  Pulse: 92 82 90 90  Resp: 20 19 18 18   Temp: 98.6 F (37 C) 98.3 F (36.8 C)  99 F (37.2 C)  TempSrc: Oral Oral  Oral  SpO2: 93% 96% 93% 96%    General: Pt is alert, awake, not in acute distress Cardiovascular: RRR, S1/S2 +, no rubs, no gallops Respiratory: CTA bilaterally, no wheezing, no rhonchi Abdominal: Soft, NT, ND, bowel sounds + Extremities: no edema, no cyanosis    The results of significant diagnostics from this hospitalization (including imaging, microbiology, ancillary and laboratory) are listed below for reference.     Microbiology: Recent Results (from the past 240 hour(s))  Resp panel by RT-PCR (RSV, Flu A&B, Covid) Anterior Nasal Swab     Status: None   Collection Time: 11/01/22  9:28 AM   Specimen: Anterior Nasal Swab  Result Value Ref Range Status   SARS Coronavirus 2 by RT PCR NEGATIVE NEGATIVE Final   Influenza A by PCR NEGATIVE NEGATIVE Final   Influenza B by PCR NEGATIVE NEGATIVE Final    Comment: (NOTE) The Xpert Xpress SARS-CoV-2/FLU/RSV plus assay is intended as an aid in the diagnosis of influenza from Nasopharyngeal swab specimens and should not be used as a sole basis for treatment. Nasal washings and aspirates are unacceptable for Xpert Xpress SARS-CoV-2/FLU/RSV testing.  Fact Sheet for  Patients: BloggerCourse.com  Fact Sheet for Healthcare Providers: SeriousBroker.it  This test is not yet approved or cleared by the [place] FDA and has been authorized for detection and/or diagnosis of SARS-CoV-2 by FDA under an Emergency Use Authorization (EUA). This EUA will remain in effect (meaning this test can be used) for the duration of the COVID-19 declaration under Section 564(b)(1) of the Act, 21 U.S.C. section 360bbb-3(b)(1), unless the authorization is terminated or revoked.     Resp Syncytial Virus by PCR NEGATIVE NEGATIVE Final    Comment: (NOTE) Fact Sheet for Patients: BloggerCourse.com  Fact Sheet for Healthcare Providers: SeriousBroker.it  This test is not yet approved or cleared by the [place] FDA and has been authorized for detection and/or diagnosis of SARS-CoV-2 by FDA under an Emergency Use Authorization (EUA). This EUA will remain in effect (meaning this test can be used) for the duration of the COVID-19 declaration under Section 564(b)(1) of the Act, 21 U.S.C. section 360bbb-3(b)(1), unless the authorization is terminated or revoked.  Performed at [facility], [address], [place], [place] [address]   Respiratory (~20 pathogens) panel by PCR     Status: Abnormal   Collection Time: 11/01/22  2:47 PM   Specimen: Nasopharyngeal Swab; Respiratory  Result Value Ref Range Status   Adenovirus NOT DETECTED NOT DETECTED Final   Coronavirus 229E NOT DETECTED NOT DETECTED Final    Comment: (NOTE) The Coronavirus on the Respiratory Panel, DOES NOT test for the novel  Coronavirus (2019 nCoV)    Coronavirus HKU1 NOT DETECTED NOT DETECTED Final   Coronavirus NL63 NOT DETECTED NOT DETECTED Final   Coronavirus OC43 NOT DETECTED NOT DETECTED Final   Metapneumovirus NOT  DETECTED NOT DETECTED Final   Rhinovirus / Enterovirus NOT DETECTED NOT  DETECTED Final   Influenza A NOT DETECTED NOT DETECTED Final   Influenza B NOT DETECTED NOT DETECTED Final   Parainfluenza Virus 1 NOT DETECTED NOT DETECTED Final   Parainfluenza Virus 2 NOT DETECTED NOT DETECTED Final   Parainfluenza Virus 3 NOT DETECTED NOT DETECTED Final   Parainfluenza Virus 4 NOT DETECTED NOT DETECTED Final   Respiratory Syncytial Virus NOT DETECTED NOT DETECTED Final   Bordetella pertussis NOT DETECTED NOT DETECTED Final   Bordetella Parapertussis NOT DETECTED NOT DETECTED Final   Chlamydophila pneumoniae NOT DETECTED NOT DETECTED Final   Mycoplasma pneumoniae DETECTED (A) NOT DETECTED Final    Comment: Performed at [facility], [address] [place], [place] [address]  SARS Coronavirus 2 by RT PCR (hospital order, performed in [facility] hospital lab) *cepheid single result test* Anterior Nasal Swab     Status: None   Collection Time: 11/01/22  2:56 PM   Specimen: Anterior Nasal Swab  Result Value Ref Range Status   SARS Coronavirus 2 by RT PCR NEGATIVE NEGATIVE Final    Comment: Performed at [facility], [address] [place] [address] [place], [place] [address]     Labs: BNP (last 3 results) No results for input(s): "BNP" in the last 8760 hours. Basic Metabolic Panel: Recent Labs  Lab 11/01/22 0927 11/02/22 0729  NA 133* 137  K 3.5 3.4*  CL 101 102  CO2 23 23  GLUCOSE 116* 99  BUN 6* 6*  CREATININE 0.64 0.61  CALCIUM 8.9 9.3   Liver Function Tests: Recent Labs  Lab 11/01/22 0927 11/02/22 0729  AST 16 15  ALT 19 19  ALKPHOS 44 42  BILITOT 0.6 0.7  PROT 6.7 6.7  ALBUMIN 3.2* 3.0*   No results for input(s): "LIPASE", "AMYLASE" in the last 168 hours. No results for input(s): "AMMONIA" in the last 168 hours. CBC: Recent Labs  Lab 11/01/22 0927 11/02/22 0729  WBC 9.7 9.3  NEUTROABS 7.1  --   HGB 11.7* 11.2*  HCT 35.6* 34.1*  MCV 92.5 91.2  PLT 285 320   Cardiac Enzymes: No results for input(s): "CKTOTAL", "CKMB", "CKMBINDEX",  "TROPONINI" in the last 168 hours. BNP: Invalid input(s): "POCBNP" CBG: Recent Labs  Lab 11/01/22 1701 11/02/22 0022 11/02/22 0419 11/02/22 0804  GLUCAP 129* 112* 112* 89   D-Dimer No results for input(s): "DDIMER" in the last 72 hours. Hgb A1c No results for input(s): "HGBA1C" in the last 72 hours. Lipid Profile No results for input(s): "CHOL", "HDL", "LDLCALC", "TRIG", "CHOLHDL", "LDLDIRECT" in the last 72 hours. Thyroid function studies No results for input(s): "TSH", "T4TOTAL", "T3FREE", "THYROIDAB" in the last 72 hours.  Invalid input(s): "FREET3" Anemia work up No results for input(s): "VITAMINB12", "FOLATE", "FERRITIN", "TIBC", "IRON", "RETICCTPCT" in the last 72 hours. Urinalysis    Component Value Date/Time   COLORURINE YELLOW 11/01/2022 0927   APPEARANCEUR CLEAR 11/01/2022 0927   LABSPEC 1.017 11/01/2022 0927   PHURINE 6.0 11/01/2022 0927   GLUCOSEU NEGATIVE 11/01/2022 0927   GLUCOSEU NEGATIVE 05/25/2020 1518   HGBUR SMALL (A) 11/01/2022 0927   BILIRUBINUR NEGATIVE 11/01/2022 0927   KETONESUR NEGATIVE 11/01/2022 0927   PROTEINUR 30 (A) 11/01/2022 0927   UROBILINOGEN 0.2 05/25/2020 1518   NITRITE NEGATIVE 11/01/2022 0927   LEUKOCYTESUR NEGATIVE 11/01/2022 0927   Sepsis Labs Recent Labs  Lab 11/01/22 0927 11/02/22 0729  WBC 9.7 9.3   Microbiology Recent Results (from the  past 240 hour(s))  Resp panel by RT-PCR (RSV, Flu A&B, Covid) Anterior Nasal Swab     Status: None   Collection Time: 11/01/22  9:28 AM   Specimen: Anterior Nasal Swab  Result Value Ref Range Status   SARS Coronavirus 2 by RT PCR NEGATIVE NEGATIVE Final   Influenza A by PCR NEGATIVE NEGATIVE Final   Influenza B by PCR NEGATIVE NEGATIVE Final    Comment: (NOTE) The Xpert Xpress SARS-CoV-2/FLU/RSV plus assay is intended as an aid in the diagnosis of influenza from Nasopharyngeal swab specimens and should not be used as a sole basis for treatment. Nasal washings and aspirates are  unacceptable for Xpert Xpress SARS-CoV-2/FLU/RSV testing.  Fact Sheet for Patients: BloggerCourse.com  Fact Sheet for Healthcare Providers: SeriousBroker.it  This test is not yet approved or cleared by the [place] FDA and has been authorized for detection and/or diagnosis of SARS-CoV-2 by FDA under an Emergency Use Authorization (EUA). This EUA will remain in effect (meaning this test can be used) for the duration of the COVID-19 declaration under Section 564(b)(1) of the Act, 21 U.S.C. section 360bbb-3(b)(1), unless the authorization is terminated or revoked.     Resp Syncytial Virus by PCR NEGATIVE NEGATIVE Final    Comment: (NOTE) Fact Sheet for Patients: BloggerCourse.com  Fact Sheet for Healthcare Providers: SeriousBroker.it  This test is not yet approved or cleared by the [place] FDA and has been authorized for detection and/or diagnosis of SARS-CoV-2 by FDA under an Emergency Use Authorization (EUA). This EUA will remain in effect (meaning this test can be used) for the duration of the COVID-19 declaration under Section 564(b)(1) of the Act, 21 U.S.C. section 360bbb-3(b)(1), unless the authorization is terminated or revoked.  Performed at [facility], [address] [place], [place] [address]   Respiratory (~20 pathogens) panel by PCR     Status: Abnormal   Collection Time: 11/01/22  2:47 PM   Specimen: Nasopharyngeal Swab; Respiratory  Result Value Ref Range Status   Adenovirus NOT DETECTED NOT DETECTED Final   Coronavirus 229E NOT DETECTED NOT DETECTED Final    Comment: (NOTE) The Coronavirus on the Respiratory Panel, DOES NOT test for the novel  Coronavirus (2019 nCoV)    Coronavirus HKU1 NOT DETECTED NOT DETECTED Final   Coronavirus NL63 NOT DETECTED NOT DETECTED Final   Coronavirus OC43 NOT DETECTED NOT DETECTED Final   Metapneumovirus NOT  DETECTED NOT DETECTED Final   Rhinovirus / Enterovirus NOT DETECTED NOT DETECTED Final   Influenza A NOT DETECTED NOT DETECTED Final   Influenza B NOT DETECTED NOT DETECTED Final   Parainfluenza Virus 1 NOT DETECTED NOT DETECTED Final   Parainfluenza Virus 2 NOT DETECTED NOT DETECTED Final   Parainfluenza Virus 3 NOT DETECTED NOT DETECTED Final   Parainfluenza Virus 4 NOT DETECTED NOT DETECTED Final   Respiratory Syncytial Virus NOT DETECTED NOT DETECTED Final   Bordetella pertussis NOT DETECTED NOT DETECTED Final   Bordetella Parapertussis NOT DETECTED NOT DETECTED Final   Chlamydophila pneumoniae NOT DETECTED NOT DETECTED Final   Mycoplasma pneumoniae DETECTED (A) NOT DETECTED Final    Comment: Performed at [facility], [address], [place], [place] [address]  SARS Coronavirus 2 by RT PCR (hospital order, performed in [facility] hospital lab) *cepheid single result test* Anterior Nasal Swab     Status: None   Collection Time: 11/01/22  2:56 PM   Specimen: Anterior Nasal Swab  Result Value Ref Range Status  SARS Coronavirus 2 by RT PCR NEGATIVE NEGATIVE Final    Comment: Performed at [facility], [address] [place], [place] [address]    Please note: You were cared for by a hospitalist during your hospital stay. Once you are discharged, your primary care physician will handle any further medical issues. Please note that NO REFILLS for any discharge medications will be authorized once you are discharged, as it is imperative that you return to your primary care physician (or establish a relationship with a primary care physician if you do not have one) for your post hospital discharge needs so that they can reassess your need for medications and monitor your lab values.    Time coordinating discharge: 40 minutes  SIGNED:   Burnadette Pop, MD  [facility] 11/02/2022, 11:00 AM Pager [phone]  If 7PM-7AM, please contact  night-coverage [email or web address] Password [email or web address]

## 2022-11-02 NOTE — Progress Notes (Signed)
   11/01/22 2358  BiPAP/CPAP/SIPAP  Reason BIPAP/CPAP not in use Non-compliant (pt stated that she didnt want to wear one tonight RT asked 2x)  BiPAP/CPAP /SiPAP Vitals  Pulse Rate 90  Resp 18  SpO2 93 %  Bilateral Breath Sounds Clear;Diminished

## 2022-11-02 NOTE — Care Management Obs Status (Signed)
MEDICARE OBSERVATION STATUS NOTIFICATION   Patient Details  Name: [name] MRN: [id number] Date of Birth: 08/10/1955   Medicare Observation Status Notification Given:  Yes    Lawerance Sabal, RN 11/02/2022, 8:50 AM

## 2022-11-03 LAB — MISC LABCORP TEST (SEND OUT): Labcorp test code: 83935

## 2022-11-21 ENCOUNTER — Other Ambulatory Visit: Payer: Self-pay | Admitting: Cardiology

## 2022-11-26 ENCOUNTER — Other Ambulatory Visit: Payer: Self-pay | Admitting: Physician Assistant

## 2022-11-30 ENCOUNTER — Other Ambulatory Visit: Payer: Self-pay

## 2022-11-30 ENCOUNTER — Telehealth: Payer: Self-pay | Admitting: Physician Assistant

## 2022-11-30 MED ORDER — ROSUVASTATIN CALCIUM 5 MG PO TABS: 5.0000 mg | ORAL_TABLET | Freq: Every day | ORAL | 0 refills | Status: DC

## 2022-11-30 NOTE — Telephone Encounter (Signed)
MADE APPT FOR THIS FRIDAY BUT NEEDS MEDS ASAP, SHE IS OUT   Prescription Request  11/30/2022  LOV: 12/28/2021  What is the name of the medication or equipment?  rosuvastatin (CRESTOR) 5 MG tablet   Have you contacted your pharmacy to request a refill? Yes   Which pharmacy would you like this sent to? [facility] [address] ([address]), [place] - [address] [place]) [place] [address] Phone: [phone] Fax: [phone]   Patient notified that their request is being sent to the clinical staff for review and that they should receive a response within 2 business days.   Please advise at Mobile [phone] (mobile)

## 2022-11-30 NOTE — Telephone Encounter (Signed)
Noted and agreed, thank you.

## 2022-11-30 NOTE — Telephone Encounter (Signed)
Rx sent to pharmacy; pt scheduled for an appointment this Friday

## 2022-12-02 ENCOUNTER — Encounter: Payer: Self-pay | Admitting: Physician Assistant

## 2022-12-02 ENCOUNTER — Ambulatory Visit (INDEPENDENT_AMBULATORY_CARE_PROVIDER_SITE_OTHER): Payer: Medicare Other | Admitting: Physician Assistant

## 2022-12-02 VITALS — BP 91/59 | HR 78 | Temp 97.2°F | Ht 64.0 in | Wt 228.4 lb

## 2022-12-02 DIAGNOSIS — E119 Type 2 diabetes mellitus without complications: Secondary | ICD-10-CM

## 2022-12-02 DIAGNOSIS — E782 Mixed hyperlipidemia: Secondary | ICD-10-CM | POA: Diagnosis not present

## 2022-12-02 DIAGNOSIS — R202 Paresthesia of skin: Secondary | ICD-10-CM

## 2022-12-02 DIAGNOSIS — Z7984 Long term (current) use of oral hypoglycemic drugs: Secondary | ICD-10-CM

## 2022-12-02 DIAGNOSIS — M1990 Unspecified osteoarthritis, unspecified site: Secondary | ICD-10-CM | POA: Diagnosis not present

## 2022-12-02 DIAGNOSIS — I350 Nonrheumatic aortic (valve) stenosis: Secondary | ICD-10-CM | POA: Diagnosis not present

## 2022-12-02 DIAGNOSIS — B372 Candidiasis of skin and nail: Secondary | ICD-10-CM | POA: Diagnosis not present

## 2022-12-02 DIAGNOSIS — J189 Pneumonia, unspecified organism: Secondary | ICD-10-CM

## 2022-12-02 MED ORDER — DICLOFENAC SODIUM 1 % EX GEL: 4.0000 g | Freq: Four times a day (QID) | CUTANEOUS | 1 refills | Status: DC

## 2022-12-02 MED ORDER — CLOTRIMAZOLE 1 % EX CREA: 1.0000 | TOPICAL_CREAM | Freq: Two times a day (BID) | CUTANEOUS | 0 refills | Status: DC

## 2022-12-02 NOTE — Progress Notes (Signed)
Subjective:    Patient ID: [name], female    DOB: 27-Dec-1955, 67 y.o.   MRN: [id number]  Chief Complaint  Patient presents with   Medication Refill    HPI Discussed the use of AI scribe software for clinical note transcription with the patient, who gave verbal consent to proceed.  Presents with her partner today.  History of Present Illness   A patient with a history of diabetes managed with metformin, presents for a follow-up visit after a recent hospitalization for pneumonia. She reports that she had initially misdiagnosed herself with COVID-19 after a positive over-the-counter test and was prescribed Paxlovid. However, she felt unwell and sought further medical attention, leading to the diagnosis of pneumonia. She reports persistent coughing and thick sputum production during her illness, but these symptoms have since resolved.  Pt also discusses her diabetes management. She has been seeing an endocrinologist, Dr. Lucianne Muss, who has recently retired. Her diabetes has been well-controlled, with her last A1C at 5.8, and she is only on metformin. She expresses interest in having her diabetes managed by her primary care provider going forward.  In addition, pt mentions a heart murmur due to a narrowing aorta. She reports that she was told her aorta is narrowing, reducing the amount of blood being pumped through. She was informed that if the condition worsens, she may need a valve replacement procedure, which could be done through the groin.  Pt reports arthritis in her knees and shoulders. She has been using over-the-counter treatments with minimal relief.       Past Medical History:  Diagnosis Date   Anxiety    Aortic stenosis, mild    echo 2017   Breast cancer (HCC) 1996   left breast   Carpal tunnel syndrome    Cataract    Chronic diastolic CHF (congestive heart failure) (HCC)    NYHA class II   COVID-19    12/02/21   Diabetes mellitus without complication (HCC)     Hyperlipidemia    Hypertension    Left bundle branch block (LBBB)    Low back pain    Neuropathy    NICM (nonischemic cardiomyopathy) (HCC)    EF 30%, cath 05/20/2015 clean coronary.  EF resolved by echo with EF 55%   OSA on CPAP    Prediabetes    Pulmonary HTN (HCC) 05/2015   Severe with PASP 67/76mmHg - resolved on followup echo   PVC's (premature ventricular contractions) 11/04/2015   Sleep apnea    cpap    Past Surgical History:  Procedure Laterality Date   BREAST SURGERY     CARDIAC CATHETERIZATION N/A 05/20/2015   Procedure: Right/Left Heart Cath and Coronary Angiography;  Surgeon: Lennette Bihari, MD;  Location: [facility];  Service: Cardiovascular;  Laterality: N/A;   CATARACT EXTRACTION W/ INTRAOCULAR LENS  IMPLANT, BILATERAL     COLONOSCOPY  2018   DILATION AND CURETTAGE OF UTERUS     lumpectomy left breast     POLYPECTOMY     UTERINE FIBROID SURGERY      Family History  Problem Relation Age of Onset   Arthritis Mother    Depression Mother    Hearing loss Mother    Hyperlipidemia Mother    Miscarriages / [place] Mother    Colon cancer Mother 53       2010   Colon polyps Mother    Alcohol abuse Father    Cancer Father  PROSTATE   Depression Father    Hypertension Father    Hyperlipidemia Father    Drug abuse Brother    Early death Maternal Grandmother    Cancer Paternal Grandmother    Heart attack Neg Hx    Diabetes Neg Hx    Esophageal cancer Neg Hx    Rectal cancer Neg Hx    Stomach cancer Neg Hx     Social History   Tobacco Use   Smoking status: Former    Current packs/day: 0.00    Types: Cigarettes    Quit date: 06/18/2009    Years since quitting: 13.4   Smokeless tobacco: Never  Vaping Use   Vaping status: Never Used  Substance Use Topics   Alcohol use: Yes    Comment: once every 2 months   Drug use: No     Allergies  Allergen Reactions   Jardiance [Empagliflozin] Anaphylaxis, Itching, Rash and Other (See Comments)     Yeast infection   Atorvastatin Other (See Comments)    REACTION: mylagias   Pravastatin Sodium Other (See Comments)    REACTION: myalgias   Sulfa Antibiotics Hives    Review of Systems NEGATIVE UNLESS OTHERWISE INDICATED IN HPI      Objective:     BP (!) 91/59   Pulse 78   Temp (!) 97.2 F (36.2 C) (Temporal)   Ht 5\' 4"  (1.626 m)   Wt 228 lb 6.4 oz (103.6 kg)   SpO2 97%   BMI 39.20 kg/m   Wt Readings from Last 3 Encounters:  12/02/22 228 lb 6.4 oz (103.6 kg)  10/31/22 225 lb (102.1 kg)  10/26/22 229 lb (103.9 kg)    BP Readings from Last 3 Encounters:  12/02/22 (!) 91/59  11/02/22 135/82  10/26/22 (!) 106/59     Physical Exam Vitals reviewed.  Constitutional:      Appearance: Normal appearance. She is well-developed. She is obese.  HENT:     Head: Normocephalic and atraumatic.  Eyes:     Conjunctiva/sclera: Conjunctivae normal.     Pupils: Pupils are equal, round, and reactive to light.  Neck:     Thyroid: No thyromegaly.     Vascular: No carotid bruit.  Cardiovascular:     Rate and Rhythm: Normal rate and regular rhythm.     Heart sounds: Murmur (previously known) heard.  Pulmonary:     Effort: Pulmonary effort is normal.     Breath sounds: Normal breath sounds.  Musculoskeletal:     Cervical back: Normal range of motion and neck supple.     Right lower leg: Edema present.     Left lower leg: Edema present.     Comments: Non-pitting edema BLE  Lymphadenopathy:     Cervical: No cervical adenopathy.  Skin:    General: Skin is warm and dry.     Capillary Refill: Capillary refill takes less than 2 seconds.     Findings: No rash.  Neurological:     General: No focal deficit present.     Mental Status: She is alert and oriented to person, place, and time.     Cranial Nerves: No cranial nerve deficit.     Coordination: Coordination normal.     Deep Tendon Reflexes: Reflexes normal.  Psychiatric:        Behavior: Behavior normal.         Assessment & Plan:  Type 2 diabetes mellitus without complication, without long-term current use of insulin (HCC) -  CBC with Differential/Platelet; Future -     Comprehensive metabolic panel; Future -     Lipid panel; Future -     TSH; Future -     Hemoglobin A1c; Future -     Microalbumin / creatinine urine ratio; Future  Mixed hyperlipidemia -     Comprehensive metabolic panel; Future -     Lipid panel; Future -     Hemoglobin A1c; Future  Morbid obesity (HCC) -     CBC with Differential/Platelet; Future -     Comprehensive metabolic panel; Future -     Lipid panel; Future -     TSH; Future -     Hemoglobin A1c; Future  Pneumonia of right lower lobe due to infectious organism -     DG Chest 2 View; Future  Paresthesias -     Vitamin B12; Future  Candidiasis, intertrigo -     Clotrimazole; Apply 1 Application topically 2 (two) times daily.  Dispense: 60 g; Refill: 0  Arthritis  Aortic stenosis, mild  Other orders -     Diclofenac Sodium; Apply 4 g topically 4 (four) times daily. Arthritis  Dispense: 350 g; Refill: 1     Assessment and Plan    Diabetes Mellitus Well controlled on Metformin. Last A1c was 5.8. 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 -Continue Metformin 1500 mg every day.  -Check A1c, cholesterol, and urine for proteinuria at next lab visit.  Pneumonia Recent hospitalization for pneumonia. Currently asymptomatic with clear lung sounds on examination. -Order repeat chest x-ray to ensure resolution of pneumonia.  Aortic Stenosis Murmur noted on examination. Recent echocardiogram showed narrowing of the aorta. -Continue monitoring symptoms and follow up with cardiology in 6 months as recommended.  Arthritis Reports arthritis in knees and shoulders. Requested Diclofenac 1% gel for symptom management. -Prescribe Diclofenac 1% gel for use on knees.  Skin Fungal Infection Requested refill of Lotrimin cream for occasional skin fungal  infections. -Prescribe Lotrimin cream as needed for fungal skin infections.  Hyperlipidemia On Rosuvastatin. -Continue Rosuvastatin 5 mg every day.   General Health Maintenance -Continue annual eye exams for diabetes monitoring. -Flu shot to be administered at another location.         Return in about 6 months (around 06/02/2023) for recheck/follow-up.   Delora Gravatt M Breyson Kelm, PA-C

## 2022-12-02 NOTE — Patient Instructions (Addendum)
Please go for fasting labs and repeat chest xray next Wednesday:  [facility] [address] [place], [place] [address] Tel: [phone] Hours: M-F 8:30 am - 5:00 pm Closed for lunch 12:30 pm - 1:00 pm  Keep working on regular exercise and good nutrition.  Medications were refilled.

## 2022-12-07 ENCOUNTER — Other Ambulatory Visit (INDEPENDENT_AMBULATORY_CARE_PROVIDER_SITE_OTHER): Payer: Medicare Other

## 2022-12-07 ENCOUNTER — Telehealth: Payer: Self-pay

## 2022-12-07 ENCOUNTER — Ambulatory Visit (INDEPENDENT_AMBULATORY_CARE_PROVIDER_SITE_OTHER)
Admission: RE | Admit: 2022-12-07 | Discharge: 2022-12-07 | Disposition: A | Discharge status: Home / Self Care | Payer: Medicare Other | Source: Ambulatory Visit | Attending: Physician Assistant

## 2022-12-07 DIAGNOSIS — E119 Type 2 diabetes mellitus without complications: Secondary | ICD-10-CM

## 2022-12-07 DIAGNOSIS — E782 Mixed hyperlipidemia: Secondary | ICD-10-CM

## 2022-12-07 DIAGNOSIS — J189 Pneumonia, unspecified organism: Secondary | ICD-10-CM

## 2022-12-07 DIAGNOSIS — R202 Paresthesia of skin: Secondary | ICD-10-CM | POA: Diagnosis not present

## 2022-12-07 DIAGNOSIS — I509 Heart failure, unspecified: Secondary | ICD-10-CM | POA: Diagnosis not present

## 2022-12-07 DIAGNOSIS — R059 Cough, unspecified: Secondary | ICD-10-CM | POA: Diagnosis not present

## 2022-12-07 DIAGNOSIS — R918 Other nonspecific abnormal finding of lung field: Secondary | ICD-10-CM | POA: Diagnosis not present

## 2022-12-07 DIAGNOSIS — I110 Hypertensive heart disease with heart failure: Secondary | ICD-10-CM | POA: Diagnosis not present

## 2022-12-07 LAB — MICROALBUMIN / CREATININE URINE RATIO
Creatinine,U: 208.8 mg/dL
Microalb Creat Ratio: 0.6 mg/g (ref 0.0–30.0)
Microalb, Ur: 1.2 mg/dL (ref 0.0–1.9)

## 2022-12-07 LAB — COMPREHENSIVE METABOLIC PANEL
ALT: 10 U/L (ref 0–35)
AST: 10 U/L (ref 0–37)
Albumin: 4 g/dL (ref 3.5–5.2)
Alkaline Phosphatase: 48 U/L (ref 39–117)
BUN: 14 mg/dL (ref 6–23)
CO2: 27 meq/L (ref 19–32)
Calcium: 9.6 mg/dL (ref 8.4–10.5)
Chloride: 107 meq/L (ref 96–112)
Creatinine, Ser: 0.67 mg/dL (ref 0.40–1.20)
GFR: 90.1 mL/min (ref >60.00)
Glucose, Bld: 107 mg/dL — ABNORMAL HIGH (ref 70–99)
Potassium: 4 meq/L (ref 3.5–5.1)
Sodium: 139 meq/L (ref 135–145)
Total Bilirubin: 0.4 mg/dL (ref 0.2–1.2)
Total Protein: 6.7 g/dL (ref 6.0–8.3)

## 2022-12-07 LAB — LIPID PANEL
Cholesterol: 133 mg/dL (ref 0–200)
HDL: 54.7 mg/dL (ref >39.00)
LDL Cholesterol: 61 mg/dL (ref 0–99)
NonHDL: 78.59
Total CHOL/HDL Ratio: 2
Triglycerides: 89 mg/dL (ref 0.0–149.0)
VLDL: 17.8 mg/dL (ref 0.0–40.0)

## 2022-12-07 LAB — HEMOGLOBIN A1C: Hgb A1c MFr Bld: 6.5 % (ref 4.6–6.5)

## 2022-12-07 LAB — CBC WITH DIFFERENTIAL/PLATELET
Basophils Absolute: 0 10*3/uL (ref 0.0–0.1)
Basophils Relative: 0.4 % (ref 0.0–3.0)
Eosinophils Absolute: 0.1 10*3/uL (ref 0.0–0.7)
Eosinophils Relative: 1.3 % (ref 0.0–5.0)
HCT: 36.6 % (ref 36.0–46.0)
Hemoglobin: 12 g/dL (ref 12.0–15.0)
Lymphocytes Relative: 28.4 % (ref 12.0–46.0)
Lymphs Abs: 2.2 10*3/uL (ref 0.7–4.0)
MCHC: 32.7 g/dL (ref 30.0–36.0)
MCV: 93.9 fL (ref 78.0–100.0)
Monocytes Absolute: 0.6 10*3/uL (ref 0.1–1.0)
Monocytes Relative: 7.3 % (ref 3.0–12.0)
Neutro Abs: 4.7 10*3/uL (ref 1.4–7.7)
Neutrophils Relative %: 62.6 % (ref 43.0–77.0)
Platelets: 325 10*3/uL (ref 150.0–400.0)
RBC: 3.9 Mil/uL (ref 3.87–5.11)
RDW: 14.8 % (ref 11.5–15.5)
WBC: 7.6 10*3/uL (ref 4.0–10.5)

## 2022-12-07 LAB — TSH: TSH: 0.65 u[IU]/mL (ref 0.35–5.50)

## 2022-12-07 LAB — VITAMIN B12: Vitamin B-12: 185 pg/mL — ABNORMAL LOW (ref 211–911)

## 2022-12-07 NOTE — Telephone Encounter (Signed)
Chest Xray completed; recommends CT chest, to exclude pulmonary nodule, states small opacities, may be pneumonia but needs to exclude. PCP is aware per previous note sent

## 2022-12-09 ENCOUNTER — Telehealth: Payer: Self-pay | Admitting: Physician Assistant

## 2022-12-09 NOTE — Telephone Encounter (Signed)
Patient called for an update on lab results. States she is going to be out of the country and unreachable from 11/2-11/9. States she will have access to MyChart prior to then.

## 2022-12-12 ENCOUNTER — Other Ambulatory Visit: Payer: Self-pay

## 2022-12-12 DIAGNOSIS — J189 Pneumonia, unspecified organism: Secondary | ICD-10-CM

## 2022-12-12 NOTE — Telephone Encounter (Signed)
See lab note; Chest CT ordered per patient agreement

## 2022-12-27 ENCOUNTER — Ambulatory Visit (INDEPENDENT_AMBULATORY_CARE_PROVIDER_SITE_OTHER): Payer: Medicare Other | Admitting: Family Medicine

## 2022-12-27 ENCOUNTER — Encounter: Payer: Self-pay | Admitting: Family Medicine

## 2022-12-27 VITALS — BP 105/69 | HR 85 | Temp 97.3°F | Ht 64.0 in | Wt 231.4 lb

## 2022-12-27 DIAGNOSIS — R059 Cough, unspecified: Secondary | ICD-10-CM | POA: Diagnosis not present

## 2022-12-27 DIAGNOSIS — I110 Hypertensive heart disease with heart failure: Secondary | ICD-10-CM | POA: Diagnosis not present

## 2022-12-27 DIAGNOSIS — H6690 Otitis media, unspecified, unspecified ear: Secondary | ICD-10-CM

## 2022-12-27 LAB — POCT RAPID STREP A (OFFICE): Rapid Strep A Screen: NEGATIVE

## 2022-12-27 LAB — POCT INFLUENZA A/B
Influenza A, POC: NEGATIVE
Influenza B, POC: NEGATIVE

## 2022-12-27 LAB — POC COVID19 BINAXNOW: SARS Coronavirus 2 Ag: NEGATIVE

## 2022-12-27 MED ORDER — PROMETHAZINE-DM 6.25-15 MG/5ML PO SYRP: 5.0000 mL | ORAL_SOLUTION | Freq: Four times a day (QID) | ORAL | 0 refills | Status: DC | PRN

## 2022-12-27 MED ORDER — AMOXICILLIN-POT CLAVULANATE 875-125 MG PO TABS: 1.0000 | ORAL_TABLET | Freq: Two times a day (BID) | ORAL | 0 refills | Status: DC

## 2022-12-27 NOTE — Patient Instructions (Addendum)
It was very nice to see you today!  Please start the Augmentin and cough medication.   Return if symptoms worsen or fail to improve.   Take care, Dr Jimmey Ralph  PLEASE NOTE:  If you had any lab tests, please let us know if you have not heard back within a few days. You may see your results on mychart before we have a chance to review them but we will give you a call once they are reviewed by [place].   If we ordered any referrals today, please let us know if you have not heard from their office within the next week.   If you had any urgent prescriptions sent in today, please check with the pharmacy within an hour of our visit to make sure the prescription was transmitted appropriately.   Please try these tips to maintain a healthy lifestyle:  Eat at least 3 REAL meals and 1-2 snacks per day.  Aim for no more than 5 hours between eating.  If you eat breakfast, please do so within one hour of getting up.   Each meal should contain half fruits/vegetables, one quarter protein, and one quarter carbs (no bigger than a computer mouse)  Cut down on sweet beverages. This includes juice, soda, and sweet tea.   Drink at least 1 glass of water with each meal and aim for at least 8 glasses per day  Exercise at least 150 minutes every week.

## 2022-12-27 NOTE — Progress Notes (Signed)
   [name] is a 67 y.o. female who presents today for an office visit.  Assessment/Plan:  New/Acute Problems: Cough / Otitis Media Rapid COVID, flu, and strep negative.  Likely overall picture due to viral URI however she does have evidence of otitis media in the left ear.  Will start Augmentin.  This should also treat for any developing sinusitis or pneumonia though her exam today is otherwise reassuring without any signs of systemic illness.  Encouraged hydration.  We will also send in promethazine dextromethorphan cough syrup.  Discussed reasons to return to care.  She will follow-up as needed.  Chronic Problems Addressed Today: Hypertension  Blood pressure at goal today on current regimen Entresto 24-26 twice daily, Coreg 6.25 mg twice daily, and spironolactone 12.5 mg daily.  Need to avoid decongestants.  Osteoarthritis  Okay for her to use diclofenac gel 4 times daily as needed.  Does not need refill on this today-advised that PCP refill this last month.    Subjective:  HPI:  Patient here with  cough and congestion. This has been going on for a few days. [place] did recently [place] to [place] and is not sure if was near anyone else that was sick. Tried OTC medications without much improvement. She is having some headache and nasal congestion. She is having a lot of pain in left ear.        Objective:  Physical Exam: BP 105/69   Pulse 85   Temp (!) 97.3 F (36.3 C) (Temporal)   Ht 5\' 4"  (1.626 m)   Wt 231 lb 6.4 oz (105 kg)   SpO2 97%   BMI 39.72 kg/m   Gen: No acute distress, resting comfortably HEENT: Left TM erythematous with effusion.  Right TM erythematous. CV: Regular rate and rhythm with no murmurs appreciated Pulm: Normal work of breathing, clear to auscultation bilaterally with no crackles, wheezes, or rhonchi Neuro: Grossly normal, moves all extremities Psych: Normal affect and thought content      Brandis Matsuura M. Jimmey Ralph, MD 12/27/2022 2:47 PM

## 2023-01-04 ENCOUNTER — Other Ambulatory Visit: Payer: Self-pay

## 2023-01-04 DIAGNOSIS — J189 Pneumonia, unspecified organism: Secondary | ICD-10-CM

## 2023-01-16 ENCOUNTER — Ambulatory Visit
Admission: RE | Admit: 2023-01-16 | Discharge: 2023-01-16 | Disposition: A | Discharge status: Home / Self Care | Payer: Medicare Other | Source: Ambulatory Visit | Attending: Physician Assistant

## 2023-01-16 DIAGNOSIS — R911 Solitary pulmonary nodule: Secondary | ICD-10-CM | POA: Diagnosis not present

## 2023-01-16 DIAGNOSIS — I2510 Atherosclerotic heart disease of native coronary artery without angina pectoris: Secondary | ICD-10-CM | POA: Diagnosis not present

## 2023-01-16 DIAGNOSIS — J189 Pneumonia, unspecified organism: Secondary | ICD-10-CM

## 2023-01-16 DIAGNOSIS — I700 Atherosclerosis of aorta: Secondary | ICD-10-CM | POA: Diagnosis not present

## 2023-01-24 ENCOUNTER — Telehealth: Payer: Self-pay

## 2023-01-24 NOTE — Telephone Encounter (Signed)
Patient called regarding CT Scan results and advised Radiology not read scan as of yet, and I will call today to see if we can get this read for her ASAP. Pt verbalized understanding

## 2023-01-26 ENCOUNTER — Encounter: Payer: Self-pay | Admitting: Endocrinology

## 2023-01-26 ENCOUNTER — Ambulatory Visit: Payer: Medicare Other | Admitting: Endocrinology

## 2023-01-26 VITALS — BP 128/70 | HR 72 | Resp 20 | Ht 64.0 in | Wt 224.8 lb

## 2023-01-26 DIAGNOSIS — Z7984 Long term (current) use of oral hypoglycemic drugs: Secondary | ICD-10-CM

## 2023-01-26 DIAGNOSIS — E785 Hyperlipidemia, unspecified: Secondary | ICD-10-CM | POA: Diagnosis not present

## 2023-01-26 DIAGNOSIS — E119 Type 2 diabetes mellitus without complications: Secondary | ICD-10-CM

## 2023-01-26 MED ORDER — METFORMIN HCL ER 500 MG PO TB24: ORAL_TABLET | ORAL | 3 refills | Status: DC

## 2023-01-26 MED ORDER — ONETOUCH VERIO VI STRP: ORAL_STRIP | 2 refills | Status: DC

## 2023-01-26 MED ORDER — ONETOUCH DELICA LANCETS 30G MISC: 2 refills | Status: DC

## 2023-01-26 NOTE — Progress Notes (Signed)
Outpatient Endocrinology Note Joanna Zayli Villafuerte, MD  01/26/23  Patient's Name: [name]    DOB: 1955-02-20    MRN: [id number]                                                    REASON OF VISIT: Follow up for type 2 diabetes mellitus  PCP: [name], Crist Infante, PA-C  HISTORY OF PRESENT ILLNESS:   [name] is a 67 y.o. old female with past medical history listed below, is here for follow up for type 2 diabetes mellitus.   Pertinent Diabetes History: Patient was previously seen by Dr. Lucianne Muss and was last time seen in April 2024.  Patient was diagnosed with type 2 diabetes mellitus in August 2019, hemoglobin A1c was 6.8% at the time of diagnosis.  Patient has controlled type 2 diabetes mellitus.  Chronic Diabetes Complications : Retinopathy: no. Last ophthalmology exam was done on annually, following with ophthalmology regularly.  Nephropathy: no, on ACE/ARB / valsatan Peripheral neuropathy: no Coronary artery disease: no. Has CHF.  Stroke: no  Relevant comorbidities and cardiovascular risk factors: Obesity: yes Body mass index is 38.59 kg/m.  Hypertension: Yes  Hyperlipidemia : Yes, on statin   Current / Home Diabetic regimen includes:  Metformin ER 500 mg 3 tablets daily.  Usually with different meals.  Prior diabetic medications: Vaginitis from [place].          Glycemic data:   One Touch Verio Flex glucometer download from November 28 to January 26, 2023 reviewed.  Average blood sugar 111.  She has been checking 1-3 times a day, at different times of the day.  Highest blood sugar 203.  Lowest blood sugar 86.  Fasting blood sugar mostly 105 - 120 range.  Acceptable blood sugar in the evening and afternoon 94, 112, 97, 86,.  She had one-time 203 in the morning otherwise almost all blood sugar are in the low 100 range.  No hypoglycemia.  Hypoglycemia: Patient has no hypoglycemic episodes. Patient has hypoglycemia awareness.  Factors modifying glucose control: 1.   Diabetic diet assessment: 3 meals a day.  2.  Staying active or exercising: Not able to exercise due to knee pain.  She tries to walk.  3.  Medication compliance: compliant all of the time.  Interval history  She has been taking metformin.  No GI issues.  Glucometer data as reviewed above.  Denies numbness and ting of the feet.  No other complaints today.  REVIEW OF SYSTEMS As per history of present illness.   PAST MEDICAL HISTORY: Past Medical History:  Diagnosis Date   Anxiety    Aortic stenosis, mild    echo 2017   Breast cancer (HCC) 1996   left breast   Carpal tunnel syndrome    Cataract    Chronic diastolic CHF (congestive heart failure) (HCC)    NYHA class II   COVID-19    12/02/21   Diabetes mellitus without complication (HCC)    Hyperlipidemia    Hypertension    Left bundle branch block (LBBB)    Low back pain    Neuropathy    NICM (nonischemic cardiomyopathy) (HCC)    EF 30%, cath 05/20/2015 clean coronary.  EF resolved by echo with EF 55%   OSA on CPAP    Prediabetes    Pulmonary HTN (HCC)  05/2015   Severe with PASP 67/25mmHg - resolved on followup echo   PVC's (premature ventricular contractions) 11/04/2015   Sleep apnea    cpap    PAST SURGICAL HISTORY: Past Surgical History:  Procedure Laterality Date   BREAST SURGERY     CARDIAC CATHETERIZATION N/A 05/20/2015   Procedure: Right/Left Heart Cath and Coronary Angiography;  Surgeon: Lennette Bihari, MD;  Location: [facility];  Service: Cardiovascular;  Laterality: N/A;   CATARACT EXTRACTION W/ INTRAOCULAR LENS  IMPLANT, BILATERAL     COLONOSCOPY  2018   DILATION AND CURETTAGE OF UTERUS     lumpectomy left breast     POLYPECTOMY     UTERINE FIBROID SURGERY      ALLERGIES: Allergies  Allergen Reactions   Jardiance [Empagliflozin] Anaphylaxis, Itching, Rash and Other (See Comments)    Yeast infection   Atorvastatin Other (See Comments)    REACTION: mylagias   Pravastatin Sodium Other (See  Comments)    REACTION: myalgias   Sulfa Antibiotics Hives    FAMILY HISTORY:  Family History  Problem Relation Age of Onset   Arthritis Mother    Depression Mother    Hearing loss Mother    Hyperlipidemia Mother    Miscarriages / Stillbirths Mother    Colon cancer Mother 58       2010   Colon polyps Mother    Alcohol abuse Father    Cancer Father        PROSTATE   Depression Father    Hypertension Father    Hyperlipidemia Father    Drug abuse Brother    Early death Maternal Grandmother    Cancer Paternal Grandmother    Heart attack Neg Hx    Diabetes Neg Hx    Esophageal cancer Neg Hx    Rectal cancer Neg Hx    Stomach cancer Neg Hx     SOCIAL HISTORY: Social History   Socioeconomic History   Marital status: Significant Other    Spouse name: Not on file   Number of children: Not on file   Years of education: Not on file   Highest education level: Not on file  Occupational History   Occupation: [employer] doctor  Tobacco Use   Smoking status: Former    Current packs/day: 0.00    Types: Cigarettes    Quit date: 06/18/2009    Years since quitting: 13.6   Smokeless tobacco: Never  Vaping Use   Vaping status: Never Used  Substance and Sexual Activity   Alcohol use: Yes    Comment: once every 2 months   Drug use: No   Sexual activity: Yes  Other Topics Concern   Not on file  Social History Narrative   ** Merged History Encounter **       Social Drivers of Health   Financial Resource Strain: Low Risk  (04/26/2022)   Overall Financial Resource Strain (CARDIA)    Difficulty of Paying Living Expenses: Not hard at all  Food Insecurity: No Food Insecurity (11/01/2022)   Hunger Vital Sign    Worried About Running Out of Food in the Last Year: Never true    Ran Out of Food in the Last Year: Never true  Transportation Needs: No Transportation Needs (11/01/2022)   PRAPARE - Administrator, Civil Service (Medical): No    Lack of Transportation (Non-Medical):  No  Physical Activity: Inactive (04/26/2022)   Exercise Vital Sign    Days of Exercise per Week:  0 days    Minutes of Exercise per Session: 0 min  Stress: Stress Concern Present (04/26/2022)   [employer] of Occupational Health - Occupational Stress Questionnaire    Feeling of Stress : To some extent  Social Connections: Socially Isolated (04/26/2022)   Social Connection and Isolation Panel [NHANES]    Frequency of Communication with Friends and Family: More than three times a week    Frequency of Social Gatherings with Friends and Family: More than three times a week    Attends Religious Services: Never    Database administrator or Organizations: No    Attends Banker Meetings: Never    Marital Status: Never married    MEDICATIONS:  Current Outpatient Medications  Medication Sig Dispense Refill   acetaminophen (TYLENOL) 650 MG CR tablet Take 1,300 mg by mouth as needed for pain.     amoxicillin-clavulanate (AUGMENTIN) 875-125 MG tablet Take 1 tablet by mouth 2 (two) times daily. 20 tablet 0   aspirin 81 MG tablet Take 81 mg by mouth daily.     Blood Glucose Monitoring Suppl (ONETOUCH VERIO FLEX SYSTEM) w/Device KIT Use onetouch verio flex meter to check blood sugar 3 times weekly, alternating between fasting and 2 hours after meals. 1 kit 0   carvedilol (COREG) 6.25 MG tablet TAKE 1 TABLET BY MOUTH TWICE DAILY WITH A MEAL 180 tablet 3   clotrimazole (LOTRIMIN) 1 % cream Apply 1 Application topically 2 (two) times daily. 60 g 0   Cyanocobalamin (VITAMIN B 12 PO) Take by mouth.     diclofenac Sodium (VOLTAREN) 1 % GEL Apply 4 g topically 4 (four) times daily. Arthritis 350 g 1   diphenhydrAMINE HCl (BENADRYL ALLERGY PO) Take 1 tablet by mouth as needed.     ezetimibe (ZETIA) 10 MG tablet Take 1 tablet by mouth once daily 90 tablet 2   furosemide (LASIX) 40 MG tablet TAKE 1 TO 2 TABLETS BY MOUTH ONCE DAILY AS DIRECTED 180 tablet 2   Multiple Vitamin (MULTIVITAMIN) tablet  Take 1 tablet by mouth daily.      promethazine-dextromethorphan (PROMETHAZINE-DM) 6.25-15 MG/5ML syrup Take 5 mLs by mouth 4 (four) times daily as needed. 118 mL 0   rosuvastatin (CRESTOR) 5 MG tablet Take 1 tablet (5 mg total) by mouth daily. 90 tablet 0   sacubitril-valsartan (ENTRESTO) 24-26 MG Take 1 tablet by mouth 2 (two) times daily. 180 tablet 1   spironolactone (ALDACTONE) 25 MG tablet TAKE 1/2 (ONE-HALF) TABLET BY MOUTH ONCE DAILY . 45 tablet 3   vitamin C (ASCORBIC ACID) 500 MG tablet Take 2,000 mg by mouth as needed.      glucose blood (ONETOUCH VERIO) test strip USE 1 STRIP TO CHECK BLOOD SUGAR TWO TO THREE TIMES A DAY 300 each 2   metFORMIN (GLUCOPHAGE-XR) 500 MG 24 hr tablet 3 tabs qd 270 tablet 3   OneTouch Delica Lancets 30G MISC Use OneTouch Delica lancets to check blood sugar 3 times weekly, alternating between fasting and 2 hours after meals. 300 each 2   No current facility-administered medications for this visit.    PHYSICAL EXAM: Vitals:   01/26/23 0914  BP: 128/70  Pulse: 72  Resp: 20  SpO2: 99%  Weight: 224 lb 12.8 oz (102 kg)  Height: 5\' 4"  (1.626 m)   Body mass index is 38.59 kg/m.  Wt Readings from Last 3 Encounters:  01/26/23 224 lb 12.8 oz (102 kg)  12/27/22 231 lb 6.4 oz (105 kg)  12/02/22 228 lb 6.4 oz (103.6 kg)    General: Well developed, well nourished female in no apparent distress.  HEENT: AT/[place], no external lesions.  Eyes: Conjunctiva clear and no icterus. Neck: Neck supple  Lungs: Respirations not labored Neurologic: Alert, oriented, normal speech Extremities / Skin: Dry. No sores or rashes noted.  Psychiatric: Does not appear depressed or anxious  Diabetic Foot Exam - Simple   No data filed     LABS Reviewed Lab Results  Component Value Date   HGBA1C 6.5 12/07/2022   HGBA1C 5.8 (A) 05/20/2022   HGBA1C 6.1 (A) 10/21/2021   Lab Results  Component Value Date   FRUCTOSAMINE 219 12/23/2019   Lab Results  Component Value Date    CHOL 133 12/07/2022   HDL 54.70 12/07/2022   LDLCALC 61 12/07/2022   TRIG 89.0 12/07/2022   CHOLHDL 2 12/07/2022   Lab Results  Component Value Date   MICRALBCREAT 0.6 12/07/2022   MICRALBCREAT 0.8 05/20/2022   Lab Results  Component Value Date   CREATININE 0.67 12/07/2022   Lab Results  Component Value Date   GFR 90.10 12/07/2022    ASSESSMENT / PLAN  1. Type 2 diabetes mellitus without complication, without long-term current use of insulin (HCC)     Diabetes Mellitus type 2, complicated by no known complications.  - Diabetic status / severity: controlled.   Lab Results  Component Value Date   HGBA1C 6.5 12/07/2022    - Hemoglobin A1c goal : <7%  - Medications: no change.   I) Metformin ER 500 mg 3 tablets daily.  - Home glucose testing: At least few times a week at different times of the day. - Discussed/ Gave Hypoglycemia treatment plan.  # Consult : not required at this time.   # Annual urine for microalbuminuria/ creatinine ratio, no microalbuminuria currently, continue ACE/ARB / valsartan Last  Lab Results  Component Value Date   MICRALBCREAT 0.6 12/07/2022    # Foot check nightly.  # Annual dilated diabetic eye exams.   - Diet: Eat reasonable portion sizes to promote a healthy weight - Life style / activity / exercise: discussed.  2. Blood pressure  -  BP Readings from Last 1 Encounters:  01/26/23 128/70    - Control is in target.  - No change in current plans.  3. Lipid status / Hyperlipidemia - Last  Lab Results  Component Value Date   LDLCALC 61 12/07/2022   - Continue rosuvastatin 5 mg daily and Zetia 10 mg daily managed by PCP.    Diagnoses and all orders for this visit:  Type 2 diabetes mellitus without complication, without long-term current use of insulin (HCC) -     glucose blood (ONETOUCH VERIO) test strip; USE 1 STRIP TO CHECK BLOOD SUGAR TWO TO THREE TIMES A DAY  Other orders -     OneTouch Delica Lancets 30G MISC;  Use OneTouch Delica lancets to check blood sugar 3 times weekly, alternating between fasting and 2 hours after meals. -     metFORMIN (GLUCOPHAGE-XR) 500 MG 24 hr tablet; 3 tabs qd    DISPOSITION Follow up in clinic in 6 months suggested.   All questions answered and patient verbalized understanding of the plan.  Joanna Henry Utsey, MD [facility] Endocrinology [facility] [address] [place], [address] [place], [place] [address] Phone # [phone]  At least part of this note was generated using voice recognition software. Inadvertent word errors may have occurred, which were not recognized  during the proofreading process.

## 2023-01-30 NOTE — Telephone Encounter (Signed)
LVM informing pt of PCP's request to have either a VV or in person OV to discuss CT results.

## 2023-01-31 ENCOUNTER — Ambulatory Visit: Payer: Medicare Other | Admitting: Physician Assistant

## 2023-01-31 ENCOUNTER — Encounter: Payer: Self-pay | Admitting: Physician Assistant

## 2023-01-31 VITALS — BP 100/64 | HR 83 | Temp 97.1°F | Ht 64.0 in | Wt 223.2 lb

## 2023-01-31 DIAGNOSIS — R9389 Abnormal findings on diagnostic imaging of other specified body structures: Secondary | ICD-10-CM | POA: Diagnosis not present

## 2023-01-31 DIAGNOSIS — R059 Cough, unspecified: Secondary | ICD-10-CM | POA: Diagnosis not present

## 2023-01-31 DIAGNOSIS — Z853 Personal history of malignant neoplasm of breast: Secondary | ICD-10-CM

## 2023-01-31 DIAGNOSIS — R911 Solitary pulmonary nodule: Secondary | ICD-10-CM

## 2023-01-31 LAB — POC COVID19 BINAXNOW: SARS Coronavirus 2 Ag: NEGATIVE

## 2023-01-31 MED ORDER — AZITHROMYCIN 250 MG PO TABS: ORAL_TABLET | ORAL | 0 refills | Status: AC

## 2023-01-31 MED ORDER — ALBUTEROL SULFATE HFA 108 (90 BASE) MCG/ACT IN AERS
2.0000 | INHALATION_SPRAY | Freq: Four times a day (QID) | RESPIRATORY_TRACT | 2 refills | Status: DC | PRN
Start: 2023-01-31 — End: 2023-03-09

## 2023-01-31 MED ORDER — AMOXICILLIN-POT CLAVULANATE 875-125 MG PO TABS: 1.0000 | ORAL_TABLET | Freq: Two times a day (BID) | ORAL | 0 refills | Status: AC

## 2023-01-31 NOTE — Telephone Encounter (Signed)
Patient seen in office today by PCP

## 2023-01-31 NOTE — Telephone Encounter (Signed)
Patient scheduled in office for 01/31/23 with PCP

## 2023-01-31 NOTE — Patient Instructions (Addendum)
Your appointment is scheduled with [facility] on February 18th @ 8:30 am. [name] will meet for pre appointment in that office on March 08, 2023 at 9:00 am to discuss procedure.   [address], [place], [place] [address]  [phone] Phone     VISIT SUMMARY:  During today's visit, we discussed your persistent cough and mucus production, which have not improved despite previous treatments. We also reviewed your recent chest CT findings, your history of high cholesterol, arthritis, and aortic stenosis. Additionally, we talked about general health maintenance and lifestyle recommendations.  YOUR PLAN:  -CHRONIC COUGH: A chronic cough is a cough that lasts for an extended period of time. We will test you for COVID-19 due to your symptoms and the current prevalence of the virus. You will also start on antibiotics (Augmentin and Zithromax) to treat potential bronchitis and use an inhaler to help open your airways and clear mucus.  -RIGHT MIDDLE LOBE NODULE: A nodule is a small growth in the lung. Your recent chest CT showed a 10mm x 10mm nodule with irregular borders in the right middle lobe of your lung. Given your history of breast cancer, we are referring you to oncology for further evaluation and a potential PET scan.  -HYPERLIPIDEMIA: Hyperlipidemia means having high levels of cholesterol in your blood. Despite being on cholesterol-lowering medications, there is evidence of plaque in your aorta. We will continue your current medications to manage this condition.  -ARTHRITIS: Arthritis is inflammation of the joints, causing pain and stiffness. We will continue with your current management plan for your chronic knee and shoulder pain.   INSTRUCTIONS:  Please follow up with the oncology department for further evaluation of the lung nodule and a potential PET scan. Continue taking your prescribed medications and using the inhaler as directed. We will recheck your heart valve  in January. If your symptoms worsen or you have any concerns, please contact our office.

## 2023-01-31 NOTE — Progress Notes (Signed)
Patient ID: [name], female    DOB: 01/02/1956, 67 y.o.   MRN: [id number]   Assessment & Plan:  Cough, unspecified type -     POC COVID-19 BinaxNow -     Amoxicillin-Pot Clavulanate; Take 1 tablet by mouth 2 (two) times daily for 7 days. Take with food.  Dispense: 14 tablet; Refill: 0 -     Azithromycin; Take 2 tablets on day 1, then 1 tablet daily on days 2 through 5  Dispense: 6 tablet; Refill: 0  Abnormal chest CT -     Ambulatory referral to Hematology / Oncology  Right middle lobe pulmonary nodule -     Ambulatory referral to Hematology / Oncology  Personal history of malignant neoplasm of breast -     Ambulatory referral to Hematology / Oncology  Other orders -     Albuterol Sulfate HFA; Inhale 2 puffs into the lungs every 6 (six) hours as needed for wheezing or shortness of breath.  Dispense: 1 each; Refill: 2   Assessment and Plan    Chronic Cough Persistent cough with clear and yellow sputum. No fever, chest pain, or night sweats. History of pneumonia in September. Recent chest CT shows right middle lobe nodule with irregular borders and small airway disease. -Order COVID-19 test due to current symptoms and prevalence in the community. -Prescribe antibiotic (Augmentin and Zithromax) to cover potential bronchitis. -Prescribe inhaler to help open airways and facilitate mucus clearance.  Right Middle Lobe Nodule 10mm x 10mm nodule in the right middle lobe of the lung with irregular borders. History of left breast cancer in 1996. -Refer to oncology for further evaluation and potential PET scan.  Hyperlipidemia Evidence of plaque in the aorta despite cholesterol-lowering medications. -Continue current cholesterol-lowering medications.  Arthritis Chronic knee and shoulder pain. -Continue current management.   No follow-ups on file.    Subjective:    Chief Complaint  Patient presents with   Medical Management of Chronic Issues    Pt in office to review  and discuss CT results per PCP request; pt also stating after finishing antibiotics not feeling well, cough, sneezing, but just finished antibiotics over a week ago; mucus is mostly clear but sometimes yellow; chest tightness at times    HPI Discussed the use of AI scribe software for clinical note transcription with the patient, who gave verbal consent to proceed.  She is here with her friend, Jamesetta So, today.   History of Present Illness   The patient, with a past medical history of breast cancer in 1996, pneumonia in September, and high cholesterol, presents with persistent coughing and mucus production. The patient describes the mucus as clear and yellow, varying in heaviness. The patient also reports a feeling of pressure in the head, described as feeling "puffed on the inside." Despite previous treatment with antibiotics and cough syrup, the patient's symptoms have not improved and have led to a decrease in energy levels. The patient denies experiencing fever, chills, chest pain, or night sweats. The patient's appetite remains good. The patient also mentions a history of bronchitis when younger. 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       Past Medical History:  Diagnosis Date   Anxiety    Aortic stenosis, mild    echo 2017   Breast cancer (HCC) 1996   left breast   Carpal tunnel syndrome    Cataract    Chronic diastolic CHF (congestive heart failure) (HCC)  NYHA class II   COVID-19    12/02/21   Diabetes mellitus without complication (HCC)    Hyperlipidemia    Hypertension    Left bundle branch block (LBBB)    Low back pain    Neuropathy    NICM (nonischemic cardiomyopathy) (HCC)    EF 30%, cath 05/20/2015 clean coronary.  EF resolved by echo with EF 55%   OSA on CPAP    Prediabetes    Pulmonary HTN (HCC) 05/2015   Severe with PASP 67/75mmHg - resolved on followup echo   PVC's (premature ventricular contractions) 11/04/2015    Sleep apnea    cpap    Past Surgical History:  Procedure Laterality Date   BREAST SURGERY     CARDIAC CATHETERIZATION N/A 05/20/2015   Procedure: Right/Left Heart Cath and Coronary Angiography;  Surgeon: Lennette Bihari, MD;  Location: [facility];  Service: Cardiovascular;  Laterality: N/A;   CATARACT EXTRACTION W/ INTRAOCULAR LENS  IMPLANT, BILATERAL     COLONOSCOPY  2018   DILATION AND CURETTAGE OF UTERUS     lumpectomy left breast     POLYPECTOMY     UTERINE FIBROID SURGERY      Family History  Problem Relation Age of Onset   Arthritis Mother    Depression Mother    Hearing loss Mother    Hyperlipidemia Mother    Miscarriages / Stillbirths Mother    Colon cancer Mother 35       2010   Colon polyps Mother    Alcohol abuse Father    Cancer Father        PROSTATE   Depression Father    Hypertension Father    Hyperlipidemia Father    Drug abuse Brother    Early death Maternal Grandmother    Cancer Paternal Grandmother    Heart attack Neg Hx    Diabetes Neg Hx    Esophageal cancer Neg Hx    Rectal cancer Neg Hx    Stomach cancer Neg Hx     Social History   Tobacco Use   Smoking status: Former    Current packs/day: 0.00    Types: Cigarettes    Quit date: 06/18/2009    Years since quitting: 13.6   Smokeless tobacco: Never  Vaping Use   Vaping status: Never Used  Substance Use Topics   Alcohol use: Yes    Comment: once every 2 months   Drug use: No     Allergies  Allergen Reactions   Jardiance [Empagliflozin] Anaphylaxis, Itching, Rash and Other (See Comments)    Yeast infection   Atorvastatin Other (See Comments)    REACTION: mylagias   Pravastatin Sodium Other (See Comments)    REACTION: myalgias   Sulfa Antibiotics Hives    Review of Systems NEGATIVE UNLESS OTHERWISE INDICATED IN HPI      Objective:     BP 100/64 (BP Location: Left Arm, Patient Position: Sitting, Cuff Size: Large)   Pulse 83   Temp (!) 97.1 F (36.2 C) (Temporal)    Ht 5\' 4"  (1.626 m)   Wt 223 lb 3.2 oz (101.2 kg)   SpO2 96%   BMI 38.31 kg/m   Wt Readings from Last 3 Encounters:  01/31/23 223 lb 3.2 oz (101.2 kg)  01/26/23 224 lb 12.8 oz (102 kg)  12/27/22 231 lb 6.4 oz (105 kg)    BP Readings from Last 3 Encounters:  01/31/23 100/64  01/26/23 128/70  12/27/22 105/69  Physical Exam Vitals and nursing note reviewed.  Constitutional:      General: She is not in acute distress.    Appearance: Normal appearance. She is not ill-appearing.  HENT:     Head: Normocephalic.     Right Ear: Tympanic membrane, ear canal and external ear normal.     Left Ear: Tympanic membrane, ear canal and external ear normal.     Nose: Congestion present.     Mouth/Throat:     Mouth: Mucous membranes are moist.     Pharynx: No oropharyngeal exudate or posterior oropharyngeal erythema.  Eyes:     Extraocular Movements: Extraocular movements intact.     Conjunctiva/sclera: Conjunctivae normal.     Pupils: Pupils are equal, round, and reactive to light.  Cardiovascular:     Rate and Rhythm: Normal rate and regular rhythm.     Pulses: Normal pulses.     Heart sounds: Normal heart sounds. No murmur heard. Pulmonary:     Effort: Pulmonary effort is normal. No respiratory distress.     Breath sounds: Normal breath sounds. No wheezing.     Comments: Frequent cough while in exam room Musculoskeletal:     Cervical back: Normal range of motion.  Skin:    General: Skin is warm.  Neurological:     Mental Status: She is alert and oriented to person, place, and time.  Psychiatric:        Mood and Affect: Mood normal.        Behavior: Behavior normal.          Time Spent: 54 minutes of total time was spent on the date of the encounter performing the following actions: chart review prior to seeing the patient, obtaining history, performing a medically necessary exam, counseling on the treatment plan, placing orders, and documenting in our EHR.        Athina Fahey M Yilia Sacca, PA-C

## 2023-01-31 NOTE — Telephone Encounter (Signed)
Pt called and states she need to be seen today with regards to the CT results. 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 Please advise.Marland Kitchen

## 2023-02-06 NOTE — Telephone Encounter (Signed)
Copied from CRM [id number]. Topic: General - Other >> Feb 06, 2023 10:20 AM Leavy Cella D wrote: Reason for CRM: Patients friend Jamesetta So called in regarding a referral for patient to see Oncologist. Caller would like a status update regarding referral .

## 2023-02-10 ENCOUNTER — Encounter: Payer: Self-pay | Admitting: Gastroenterology

## 2023-02-13 ENCOUNTER — Other Ambulatory Visit (HOSPITAL_COMMUNITY): Payer: Self-pay

## 2023-02-16 ENCOUNTER — Inpatient Hospital Stay: Payer: Medicare Other

## 2023-02-16 ENCOUNTER — Inpatient Hospital Stay: Payer: Medicare Other | Admitting: Internal Medicine

## 2023-02-16 ENCOUNTER — Telehealth: Payer: Self-pay

## 2023-02-16 NOTE — Progress Notes (Deleted)
 [name] [phone]   [phone]  CONSULT NOTE  REFERRING PHYSICIAN: Alyssa [name], PA-C  REASON FOR CONSULTATION:  68 years old African-American female with suspicious lung cancer  HPI [name] is a 68 y.o. female with past medical history significant for multiple medical problems including history of left breast cancer in 1996, and anxiety, mild aortic stenosis, carpal tunnel syndrome, hypertension, dyslipidemia, left bundle branch block, low back pain, neuropathy, nonischemic cardiomyopathy with obstructive sleep apnea, prediabetes, pulmonary hypertension and premature ventricular contractions.  The patient was treated for pneumonia in October 2023.  She had CT scan of the chest without contrast on January 16, 2023 for follow-up on her pneumonia treatment.  The scan showed 1.0 x 1.0 cm right middle lobe pulmonary nodule with multiple additional pulmonary nodules in the lungs bilaterally.  This include 2-3 mm left upper lobe nodule HPI  Past Medical History:  Diagnosis Date   Anxiety    Aortic stenosis, mild    echo 2017   Breast cancer (HCC) 1996   left breast   Carpal tunnel syndrome    Cataract    Chronic diastolic CHF (congestive heart failure) (HCC)    NYHA class II   COVID-19    12/02/21   Diabetes mellitus without complication (HCC)    Hyperlipidemia    Hypertension    Left bundle branch block (LBBB)    Low back pain    Neuropathy    NICM (nonischemic cardiomyopathy) (HCC)    EF 30%, cath 05/20/2015 clean coronary.  EF resolved by echo with EF 55%   OSA on CPAP    Prediabetes    Pulmonary HTN (HCC) 05/2015   Severe with PASP 67/62mmHg - resolved on followup echo   PVC's (premature ventricular contractions) 11/04/2015   Sleep apnea    cpap    Past Surgical History:  Procedure Laterality Date   BREAST SURGERY     CARDIAC CATHETERIZATION N/A 05/20/2015   Procedure: Right/Left Heart Cath and Coronary Angiography;   Surgeon: Debby DELENA Sor, MD;  Location: [facility];  Service: Cardiovascular;  Laterality: N/A;   CATARACT EXTRACTION W/ INTRAOCULAR LENS  IMPLANT, BILATERAL     COLONOSCOPY  2018   DILATION AND CURETTAGE OF UTERUS     lumpectomy left breast     POLYPECTOMY     UTERINE FIBROID SURGERY      Family History  Problem Relation Age of Onset   Arthritis Mother    Depression Mother    Hearing loss Mother    Hyperlipidemia Mother    Miscarriages / Stillbirths Mother    Colon cancer Mother 67       2010   Colon polyps Mother    Alcohol abuse Father    Cancer Father        PROSTATE   Depression Father    Hypertension Father    Hyperlipidemia Father    Drug abuse Brother    Early death Maternal Grandmother    Cancer Paternal Grandmother    Heart attack Neg Hx    Diabetes Neg Hx    Esophageal cancer Neg Hx    Rectal cancer Neg Hx    Stomach cancer Neg Hx     Social History Social History   Tobacco Use   Smoking status: Former    Current packs/day: 0.00    Types: Cigarettes    Quit date: 06/18/2009    Years since quitting: 13.6   Smokeless tobacco:  Never  Vaping Use   Vaping status: Never Used  Substance Use Topics   Alcohol use: Yes    Comment: once every 2 months   Drug use: No    Allergies  Allergen Reactions   Jardiance  [Empagliflozin ] Anaphylaxis, Itching, Rash and Other (See Comments)    Yeast infection   Atorvastatin Other (See Comments)    REACTION: mylagias   Pravastatin Sodium Other (See Comments)    REACTION: myalgias   Sulfa Antibiotics Hives    Current Outpatient Medications  Medication Sig Dispense Refill   acetaminophen  (TYLENOL ) 650 MG CR tablet Take 1,300 mg by mouth as needed for pain.     albuterol  (PROAIR  HFA) 108 (90 Base) MCG/ACT inhaler Inhale 2 puffs into the lungs every 6 (six) hours as needed for wheezing or shortness of breath. 1 each 2   amoxicillin -clavulanate (AUGMENTIN ) 875-125 MG tablet Take 1 tablet by mouth 2 (two) times  daily. (Patient not taking: Reported on 01/31/2023) 20 tablet 0   aspirin  81 MG tablet Take 81 mg by mouth daily.     Blood Glucose Monitoring Suppl (ONETOUCH VERIO FLEX SYSTEM) w/Device KIT Use onetouch verio flex meter to check blood sugar 3 times weekly, alternating between fasting and 2 hours after meals. 1 kit 0   carvedilol  (COREG ) 6.25 MG tablet TAKE 1 TABLET BY MOUTH TWICE DAILY WITH A MEAL 180 tablet 3   clotrimazole  (LOTRIMIN ) 1 % cream Apply 1 Application topically 2 (two) times daily. 60 g 0   Cyanocobalamin  (VITAMIN B 12 PO) Take by mouth.     diclofenac  Sodium (VOLTAREN ) 1 % GEL Apply 4 g topically 4 (four) times daily. Arthritis 350 g 1   diphenhydrAMINE HCl (BENADRYL ALLERGY PO) Take 1 tablet by mouth as needed.     ezetimibe  (ZETIA ) 10 MG tablet Take 1 tablet by mouth once daily 90 tablet 2   furosemide  (LASIX ) 40 MG tablet TAKE 1 TO 2 TABLETS BY MOUTH ONCE DAILY AS DIRECTED 180 tablet 2   glucose blood (ONETOUCH VERIO) test strip USE 1 STRIP TO CHECK BLOOD SUGAR TWO TO THREE TIMES A DAY 300 each 2   metFORMIN  (GLUCOPHAGE -XR) 500 MG 24 hr tablet 3 tabs qd 270 tablet 3   Multiple Vitamin (MULTIVITAMIN) tablet Take 1 tablet by mouth daily.      OneTouch Delica Lancets 30G MISC Use OneTouch Delica lancets to check blood sugar 3 times weekly, alternating between fasting and 2 hours after meals. 300 each 2   promethazine -dextromethorphan  (PROMETHAZINE -DM) 6.25-15 MG/5ML syrup Take 5 mLs by mouth 4 (four) times daily as needed. (Patient not taking: Reported on 01/31/2023) 118 mL 0   rosuvastatin  (CRESTOR ) 5 MG tablet Take 1 tablet (5 mg total) by mouth daily. 90 tablet 0   sacubitril -valsartan  (ENTRESTO ) 24-26 MG Take 1 tablet by mouth 2 (two) times daily. 180 tablet 1   spironolactone  (ALDACTONE ) 25 MG tablet TAKE 1/2 (ONE-HALF) TABLET BY MOUTH ONCE DAILY . 45 tablet 3   vitamin C (ASCORBIC ACID) 500 MG tablet Take 2,000 mg by mouth as needed.      No current facility-administered  medications for this visit.    Review of Systems  {Ros - complete:30496}  Physical Exam  RAL:{CHL ONC PE GENERAL:[phone]} SKIN: {CHL ONC PE DXPW:8845999797} HEAD: {CHL ONC PE YZJI:8845999796} EYES: {CHL ONC PE ZBZD:8845999795} EARS: {CHL ONC PE ZJMD:8845999794} OROPHARYNX:{CHL ONC PE OROPHARYNX:[id number]}  NECK: {CHL ONC PE WZRX:8845999792} LYMPH:  {CHL ONC PE OBFEY:8845999791} BREAST:{CHL ONC PE [phone]} LUNGS: {CHL ONC PE  OLWHD:8845999790} HEART: {CHL ONC PE YZJMU:8845999788} ABDOMEN:{CHL ONC PE ABDOMEN:[phone]} BACK: {CHL ONC PE AJRX:8845999786} EXTREMITIES:{CHL ONC PE EXTREMITIES:[phone]}  NEURO: {CHL ONC PE NEURO:[phone]}  PERFORMANCE STATUS: ECOG ***  LABORATORY DATA: Lab Results  Component Value Date   WBC 7.6 12/07/2022   HGB 12.0 12/07/2022   HCT 36.6 12/07/2022   MCV 93.9 12/07/2022   PLT 325.0 12/07/2022      Chemistry      Component Value Date/Time   NA 139 12/07/2022 1056   NA 139 09/13/2018 1000   K 4.0 12/07/2022 1056   CL 107 12/07/2022 1056   CO2 27 12/07/2022 1056   BUN 14 12/07/2022 1056   BUN 13 09/13/2018 1000   CREATININE 0.67 12/07/2022 1056   CREATININE 0.66 08/23/2019 1013      Component Value Date/Time   CALCIUM  9.6 12/07/2022 1056   ALKPHOS 48 12/07/2022 1056   AST 10 12/07/2022 1056   ALT 10 12/07/2022 1056   BILITOT 0.4 12/07/2022 1056   BILITOT 0.3 09/13/2018 1000       RADIOGRAPHIC STUDIES: No results found.  ASSESSMENT:   PLAN:  The patient voices understanding of current disease status and treatment options and is in agreement with the current care plan.  All questions were answered. The patient knows to call the clinic with any problems, questions or concerns. We can certainly see the patient much sooner if necessary.  Thank you so much for allowing me to participate in the care of MARCELINA MCLAURIN. I will continue to follow up the patient with you and assist in her care.  I spent {CHL  ONC TIME VISIT - DTPQU:8845999869} counseling the patient face to face. The total time spent in the appointment was {CHL ONC TIME VISIT - DTPQU:8845999869}.  Disclaimer: This note was dictated with voice recognition software. Similar sounding words can inadvertently be transcribed and may not be corrected upon review.   Sherrod MARLA Sherrod February 16, 2023, 8:35 AM

## 2023-02-16 NOTE — Telephone Encounter (Addendum)
 Patient Advocate Encounter   The patient was approved for a [employer] grant that will help cover the cost of ENTRESTO  Total amount awarded, $10,000.  Effective: 01/18/23 - 01/17/24   [id number] ERW:[id number] Hmnle:[id number]  Ileana Lehmann, CPhT  Pharmacy Patient Advocate Specialist  Direct Number: [phone] Fax: [phone]

## 2023-02-17 ENCOUNTER — Other Ambulatory Visit (HOSPITAL_COMMUNITY): Payer: Self-pay

## 2023-02-17 ENCOUNTER — Other Ambulatory Visit: Payer: Self-pay

## 2023-02-17 MED ORDER — ENTRESTO 24-26 MG PO TABS
1.0000 | ORAL_TABLET | Freq: Two times a day (BID) | ORAL | 0 refills | Status: DC
  Filled 2023-02-17: qty 180, 90d supply, fill #0

## 2023-02-17 NOTE — Telephone Encounter (Signed)
 Please send in prescription for ENTRESTO to [facility] at [facility] [place], [place]  [address])

## 2023-02-17 NOTE — Telephone Encounter (Signed)
 Per OV note by Tessa on 08/17/22: 1.  Chronic combined CHF -Updated echocardiogram ordered today -She has not had much fluid retention and overall feels well -Would continue her current medications which include aspirin  81 mg daily, carvedilol  6.25 mg twice a day, Zetia  10 mg daily, Lasix  40 mg (1-2 tabs), Crestor  5 mg daily, Entresto  24-26 mg twice a day, Aldactone  12 and half milligrams daily -Euvolemic on exam today   Entresto  refill sent to pharmacy at this time.

## 2023-02-22 ENCOUNTER — Inpatient Hospital Stay: Payer: Medicare Other

## 2023-02-22 ENCOUNTER — Other Ambulatory Visit: Payer: Self-pay | Admitting: Medical Oncology

## 2023-02-22 ENCOUNTER — Inpatient Hospital Stay: Payer: Medicare Other | Attending: Internal Medicine | Admitting: Internal Medicine

## 2023-02-22 VITALS — BP 120/69 | HR 70 | Temp 97.7°F | Resp 17 | Ht 64.0 in | Wt 226.4 lb

## 2023-02-22 DIAGNOSIS — Z923 Personal history of irradiation: Secondary | ICD-10-CM

## 2023-02-22 DIAGNOSIS — Z9221 Personal history of antineoplastic chemotherapy: Secondary | ICD-10-CM

## 2023-02-22 DIAGNOSIS — N6320 Unspecified lump in the left breast, unspecified quadrant: Secondary | ICD-10-CM

## 2023-02-22 DIAGNOSIS — F419 Anxiety disorder, unspecified: Secondary | ICD-10-CM | POA: Insufficient documentation

## 2023-02-22 DIAGNOSIS — R911 Solitary pulmonary nodule: Secondary | ICD-10-CM

## 2023-02-22 DIAGNOSIS — Z800 Family history of malignant neoplasm of digestive organs: Secondary | ICD-10-CM | POA: Diagnosis not present

## 2023-02-22 DIAGNOSIS — Z853 Personal history of malignant neoplasm of breast: Secondary | ICD-10-CM

## 2023-02-22 DIAGNOSIS — I110 Hypertensive heart disease with heart failure: Secondary | ICD-10-CM

## 2023-02-22 DIAGNOSIS — Z79899 Other long term (current) drug therapy: Secondary | ICD-10-CM | POA: Diagnosis not present

## 2023-02-22 DIAGNOSIS — Z8042 Family history of malignant neoplasm of prostate: Secondary | ICD-10-CM

## 2023-02-22 DIAGNOSIS — E1140 Type 2 diabetes mellitus with diabetic neuropathy, unspecified: Secondary | ICD-10-CM

## 2023-02-22 DIAGNOSIS — I272 Pulmonary hypertension, unspecified: Secondary | ICD-10-CM | POA: Diagnosis not present

## 2023-02-22 DIAGNOSIS — Z87891 Personal history of nicotine dependence: Secondary | ICD-10-CM | POA: Diagnosis not present

## 2023-02-22 DIAGNOSIS — I509 Heart failure, unspecified: Secondary | ICD-10-CM

## 2023-02-22 LAB — CMP (CANCER CENTER ONLY)
ALT: 16 U/L (ref 0–44)
AST: 13 U/L — ABNORMAL LOW (ref 15–41)
Albumin: 4.1 g/dL (ref 3.5–5.0)
Alkaline Phosphatase: 51 U/L (ref 38–126)
Anion gap: 6 (ref 5–15)
BUN: 15 mg/dL (ref 8–23)
CO2: 26 mmol/L (ref 22–32)
Calcium: 9.6 mg/dL (ref 8.9–10.3)
Chloride: 108 mmol/L (ref 98–111)
Creatinine: 0.66 mg/dL (ref 0.44–1.00)
GFR, Estimated: >60 mL/min (ref >60)
Glucose, Bld: 101 mg/dL — ABNORMAL HIGH (ref 70–99)
Potassium: 3.9 mmol/L (ref 3.5–5.1)
Sodium: 140 mmol/L (ref 135–145)
Total Bilirubin: 0.3 mg/dL (ref 0.0–1.2)
Total Protein: 6.8 g/dL (ref 6.5–8.1)

## 2023-02-22 LAB — CBC WITH DIFFERENTIAL (CANCER CENTER ONLY)
Abs Immature Granulocytes: 0.02 10*3/uL (ref 0.00–0.07)
Basophils Absolute: 0 10*3/uL (ref 0.0–0.1)
Basophils Relative: 0 %
Eosinophils Absolute: 0.2 10*3/uL (ref 0.0–0.5)
Eosinophils Relative: 2 %
HCT: 37.9 % (ref 36.0–46.0)
Hemoglobin: 12.7 g/dL (ref 12.0–15.0)
Immature Granulocytes: 0 %
Lymphocytes Relative: 36 %
Lymphs Abs: 2.6 10*3/uL (ref 0.7–4.0)
MCH: 30.7 pg (ref 26.0–34.0)
MCHC: 33.5 g/dL (ref 30.0–36.0)
MCV: 91.5 fL (ref 80.0–100.0)
Monocytes Absolute: 0.5 10*3/uL (ref 0.1–1.0)
Monocytes Relative: 8 %
Neutro Abs: 3.8 10*3/uL (ref 1.7–7.7)
Neutrophils Relative %: 54 %
Platelet Count: 297 10*3/uL (ref 150–400)
RBC: 4.14 MIL/uL (ref 3.87–5.11)
RDW: 13.9 % (ref 11.5–15.5)
WBC Count: 7.2 10*3/uL (ref 4.0–10.5)
nRBC: 0 % (ref 0.0–0.2)

## 2023-02-22 NOTE — Progress Notes (Signed)
 [facility] [phone]   [phone]  CONSULT NOTE  REFERRING PHYSICIAN: Alyssa [name], PA-C  REASON FOR CONSULTATION:  68 years old African-American female with suspicious right lung nodule  HPI [name] is a 68 y.o. female came with a friend today for initial evaluation of his suspicious right lung nodule. Discussed the use of AI scribe software for clinical note transcription with the patient, who gave verbal consent to proceed.  History of Present Illness   The patient, a 68 year old with a history of left breast cancer, congestive heart failure, diabetes, hypertension, hyperlipidemia, neuropathy, sleep apnea, pulmonary hypertension, aortic stenosis, and anxiety, initially presented with a respiratory illness in September 2024. The patient suspected COVID-19 and was started on Paxlovid  at home following a false positive test. However, the medication did not alleviate the symptoms, prompting the patient to seek emergency care.  In the emergency room, the patient was diagnosed with right lower lobe pneumonia based on a chest x-ray and was admitted overnight due to low oxygen levels. A follow-up chest x-ray in October 2024 revealed an unidentified density in the right lower lobe, which was suspected to be scarring or possibly a malignancy. A subsequent CT scan in December 2024 identified a 1.0 cm nodule in the right middle lobe and multiple tiny nodules in both lungs.  In the midst of these investigations, the patient noticed a new lump under the left breast, where she had previously undergone surgery for breast cancer in 1996. The patient also reported difficulty walking due to arthritis and a history of heart failure with fluid accumulation requiring hospitalization. The patient denied any current symptoms such as nausea, vomiting, diarrhea, headaches, changes in vision, or coughing up blood.  The patient has a significant smoking history but  quit approximately 14 years ago. She is single, has no children, and works as a actuary. The patient's mother had a history of colon cancer and Alzheimer's disease, and the father had hypertension, hypercholesterolemia, and prostate cancer. The patient is allergic to sulfa drugs and some statins.      HPI  Past Medical History:  Diagnosis Date   Anxiety    Aortic stenosis, mild    echo 2017   Breast cancer (HCC) 1996   left breast   Carpal tunnel syndrome    Cataract    Chronic diastolic CHF (congestive heart failure) (HCC)    NYHA class II   COVID-19    12/02/21   Diabetes mellitus without complication (HCC)    Hyperlipidemia    Hypertension    Left bundle branch block (LBBB)    Low back pain    Neuropathy    NICM (nonischemic cardiomyopathy) (HCC)    EF 30%, cath 05/20/2015 clean coronary.  EF resolved by echo with EF 55%   OSA on CPAP    Prediabetes    Pulmonary HTN (HCC) 05/2015   Severe with PASP 67/55mmHg - resolved on followup echo   PVC's (premature ventricular contractions) 11/04/2015   Sleep apnea    cpap    Past Surgical History:  Procedure Laterality Date   BREAST SURGERY     CARDIAC CATHETERIZATION N/A 05/20/2015   Procedure: Right/Left Heart Cath and Coronary Angiography;  Surgeon: Debby DELENA Sor, MD;  Location: [facility];  Service: Cardiovascular;  Laterality: N/A;   CATARACT EXTRACTION W/ INTRAOCULAR LENS  IMPLANT, BILATERAL     COLONOSCOPY  2018   DILATION AND CURETTAGE OF UTERUS  lumpectomy left breast     POLYPECTOMY     UTERINE FIBROID SURGERY      Family History  Problem Relation Age of Onset   Arthritis Mother    Depression Mother    Hearing loss Mother    Hyperlipidemia Mother    Miscarriages / Stillbirths Mother    Colon cancer Mother 90       2010   Colon polyps Mother    Alcohol abuse Father    Cancer Father        PROSTATE   Depression Father    Hypertension Father    Hyperlipidemia Father    Drug  abuse Brother    Early death Maternal Grandmother    Cancer Paternal Grandmother    Heart attack Neg Hx    Diabetes Neg Hx    Esophageal cancer Neg Hx    Rectal cancer Neg Hx    Stomach cancer Neg Hx     Social History Social History   Tobacco Use   Smoking status: Former    Current packs/day: 0.00    Types: Cigarettes    Quit date: 06/18/2009    Years since quitting: 13.6   Smokeless tobacco: Never  Vaping Use   Vaping status: Never Used  Substance Use Topics   Alcohol use: Yes    Comment: once every 2 months   Drug use: No    Allergies  Allergen Reactions   Jardiance  [Empagliflozin ] Anaphylaxis, Itching, Rash and Other (See Comments)    Yeast infection   Atorvastatin Other (See Comments)    REACTION: mylagias   Pravastatin Sodium Other (See Comments)    REACTION: myalgias   Sulfa Antibiotics Hives    Current Outpatient Medications  Medication Sig Dispense Refill   acetaminophen  (TYLENOL ) 650 MG CR tablet Take 1,300 mg by mouth as needed for pain.     albuterol  (PROAIR  HFA) 108 (90 Base) MCG/ACT inhaler Inhale 2 puffs into the lungs every 6 (six) hours as needed for wheezing or shortness of breath. 1 each 2   amoxicillin -clavulanate (AUGMENTIN ) 875-125 MG tablet Take 1 tablet by mouth 2 (two) times daily. (Patient not taking: Reported on 01/31/2023) 20 tablet 0   aspirin  81 MG tablet Take 81 mg by mouth daily.     Blood Glucose Monitoring Suppl (ONETOUCH VERIO FLEX SYSTEM) w/Device KIT Use onetouch verio flex meter to check blood sugar 3 times weekly, alternating between fasting and 2 hours after meals. 1 kit 0   carvedilol  (COREG ) 6.25 MG tablet TAKE 1 TABLET BY MOUTH TWICE DAILY WITH A MEAL 180 tablet 3   clotrimazole  (LOTRIMIN ) 1 % cream Apply 1 Application topically 2 (two) times daily. 60 g 0   Cyanocobalamin  (VITAMIN B 12 PO) Take by mouth.     diclofenac  Sodium (VOLTAREN ) 1 % GEL Apply 4 g topically 4 (four) times daily. Arthritis 350 g 1   diphenhydrAMINE HCl  (BENADRYL ALLERGY PO) Take 1 tablet by mouth as needed.     ezetimibe  (ZETIA ) 10 MG tablet Take 1 tablet by mouth once daily 90 tablet 2   furosemide  (LASIX ) 40 MG tablet TAKE 1 TO 2 TABLETS BY MOUTH ONCE DAILY AS DIRECTED 180 tablet 2   glucose blood (ONETOUCH VERIO) test strip USE 1 STRIP TO CHECK BLOOD SUGAR TWO TO THREE TIMES A DAY 300 each 2   metFORMIN  (GLUCOPHAGE -XR) 500 MG 24 hr tablet 3 tabs qd 270 tablet 3   Multiple Vitamin (MULTIVITAMIN) tablet Take 1 tablet by mouth daily.  OneTouch Delica Lancets 30G MISC Use OneTouch Delica lancets to check blood sugar 3 times weekly, alternating between fasting and 2 hours after meals. 300 each 2   promethazine -dextromethorphan  (PROMETHAZINE -DM) 6.25-15 MG/5ML syrup Take 5 mLs by mouth 4 (four) times daily as needed. (Patient not taking: Reported on 01/31/2023) 118 mL 0   rosuvastatin  (CRESTOR ) 5 MG tablet Take 1 tablet (5 mg total) by mouth daily. 90 tablet 0   sacubitril -valsartan  (ENTRESTO ) 24-26 MG Take 1 tablet by mouth 2 (two) times daily. 180 tablet 0   spironolactone  (ALDACTONE ) 25 MG tablet TAKE 1/2 (ONE-HALF) TABLET BY MOUTH ONCE DAILY . 45 tablet 3   vitamin C (ASCORBIC ACID) 500 MG tablet Take 2,000 mg by mouth as needed.      No current facility-administered medications for this visit.    Review of Systems  Constitutional: positive for fatigue Eyes: negative Ears, nose, mouth, throat, and face: negative Respiratory: negative Cardiovascular: negative Gastrointestinal: negative Genitourinary:negative Integument/breast: positive for breast lump Hematologic/lymphatic: negative Musculoskeletal:positive for arthralgias Neurological: negative Behavioral/Psych: negative Endocrine: negative Allergic/Immunologic: negative  Physical Exam  MJO:jozmu, healthy, no distress, well nourished, well developed, and anxious SKIN: skin color, texture, turgor are normal, no rashes or significant lesions HEAD: Normocephalic, No masses,  lesions, tenderness or abnormalities EYES: normal, PERRLA, Conjunctiva are pink and non-injected EARS: External ears normal, Canals clear OROPHARYNX:no exudate, no erythema, and lips, buccal mucosa, and tongue normal  NECK: supple, no adenopathy, no JVD LYMPH:  no palpable lymphadenopathy, no hepatosplenomegaly BREAST:abnormal mass palpable in the lower part of the left breast measuring less than 1.0 cm LUNGS: clear to auscultation , and palpation HEART: regular rate & rhythm, no murmurs, and no gallops ABDOMEN:abdomen soft, non-tender, normal bowel sounds, and no masses or organomegaly BACK: Back symmetric, no curvature., No CVA tenderness EXTREMITIES:no joint deformities, effusion, or inflammation, no edema  NEURO: alert & oriented x 3 with fluent speech, no focal motor/sensory deficits  PERFORMANCE STATUS: ECOG 1  LABORATORY DATA: Lab Results  Component Value Date   WBC 7.6 12/07/2022   HGB 12.0 12/07/2022   HCT 36.6 12/07/2022   MCV 93.9 12/07/2022   PLT 325.0 12/07/2022      Chemistry      Component Value Date/Time   NA 139 12/07/2022 1056   NA 139 09/13/2018 1000   K 4.0 12/07/2022 1056   CL 107 12/07/2022 1056   CO2 27 12/07/2022 1056   BUN 14 12/07/2022 1056   BUN 13 09/13/2018 1000   CREATININE 0.67 12/07/2022 1056   CREATININE 0.66 08/23/2019 1013      Component Value Date/Time   CALCIUM  9.6 12/07/2022 1056   ALKPHOS 48 12/07/2022 1056   AST 10 12/07/2022 1056   ALT 10 12/07/2022 1056   BILITOT 0.4 12/07/2022 1056   BILITOT 0.3 09/13/2018 1000       RADIOGRAPHIC STUDIES: No results found.  ASSESSMENT AND PLAN:     Pulmonary Nodule 1.0 x 1.0 cm right middle lobe pulmonary nodule identified on CT scan. Differential diagnosis includes inflammation and lung cancer. Smoking history increases malignancy risk. Multiple tiny nodules present in both lungs. No immediate symptoms. PET scan not immediately necessary due to potential inflammation. Pulmonologist  to decide on biopsy or follow-up imaging in 2-3 months. - Refer to pulmonologist for further evaluation and management  Left Breast Nodule Palpable nodule under left breast, noted approximately two weeks ago. Previous left breast cancer treated with lumpectomy, chemotherapy, and radiation in 1996. Last mammogram six months ago showed  calcifications but no malignancy. Repeat mammogram needed to evaluate the nodule. - Refer to Dr. Cloretta Peck for evaluation and repeat mammogram  General Health Maintenance Multiple chronic conditions including congestive heart failure, diabetes, hypertension, hyperlipidemia, neuropathy, sleep apnea, pulmonary hypertension, aortic stenosis, and anxiety. No new symptoms reported. - Continue regular follow-ups with primary care and specialists - Ensure compliance with current medication regimen and lifestyle modifications  Follow-up - Schedule pulmonologist appointment with lung coordinator [name] - Contact Dr. Cloretta / PCP for mammogram and further breast evaluation.   The patient was advised to call if she has any concerning symptoms in the interval. The patient voices understanding of current disease status and treatment options and is in agreement with the current care plan.  All questions were answered. The patient knows to call the clinic with any problems, questions or concerns. We can certainly see the patient much sooner if necessary.  Thank you so much for allowing me to participate in the care of [name]. I will continue to follow up the patient with you and assist in her care.  The total time spent in the appointment was 60 minutes.  Disclaimer: This note was dictated with voice recognition software. Similar sounding words can inadvertently be transcribed and may not be corrected upon review.   [name] February 22, 2023, 11:07 AM

## 2023-02-28 ENCOUNTER — Other Ambulatory Visit: Payer: Self-pay | Admitting: Physician Assistant

## 2023-03-08 ENCOUNTER — Ambulatory Visit: Payer: Medicare Other

## 2023-03-08 VITALS — Ht 64.0 in | Wt 225.0 lb

## 2023-03-08 DIAGNOSIS — Z860100 Personal history of colon polyps, unspecified: Secondary | ICD-10-CM

## 2023-03-08 MED ORDER — SUFLAVE 178.7 G PO SOLR: 1.0000 | ORAL | 0 refills | Status: DC

## 2023-03-08 NOTE — Progress Notes (Signed)
No egg or soy allergy known to patient  No issues known to pt with past sedation with any surgeries or procedures Patient denies ever being told they had issues or difficulty with intubation  No FH of Malignant Hyperthermia Pt is not on diet pills Pt is not on  home 02  Pt is not on blood thinners  Pt denies issues with constipation  No A fib or A flutter Have any cardiac testing pending--Yes, Repeat Echo scheduled for 03/13/23 Pt can ambulate  Pt denies use of chewing tobacco Discussed diabetic I weight loss medication holds Discussed NSAID holds Checked BMI Pt instructed to use Single[employer] or GoodRx for a price reduction on prep  Patient's chart reviewed by Cathlyn Parsons CNRA prior to previsit and patient appropriate for the [facility].  Pre visit completed and red dot placed by patient's name on their procedure day (on provider's schedule).

## 2023-03-09 ENCOUNTER — Other Ambulatory Visit (HOSPITAL_COMMUNITY)
Admission: RE | Admit: 2023-03-09 | Discharge: 2023-03-09 | Disposition: A | Discharge status: Home / Self Care | Payer: Medicare Other | Source: Ambulatory Visit | Attending: Physician Assistant | Admitting: Physician Assistant

## 2023-03-09 ENCOUNTER — Ambulatory Visit: Payer: Medicare Other | Admitting: Physician Assistant

## 2023-03-09 VITALS — BP 102/68 | HR 66 | Temp 97.1°F | Ht 64.0 in | Wt 220.8 lb

## 2023-03-09 DIAGNOSIS — R911 Solitary pulmonary nodule: Secondary | ICD-10-CM | POA: Diagnosis not present

## 2023-03-09 DIAGNOSIS — Z01419 Encounter for gynecological examination (general) (routine) without abnormal findings: Secondary | ICD-10-CM | POA: Diagnosis present

## 2023-03-09 DIAGNOSIS — Z1151 Encounter for screening for human papillomavirus (HPV): Secondary | ICD-10-CM | POA: Insufficient documentation

## 2023-03-09 DIAGNOSIS — Z124 Encounter for screening for malignant neoplasm of cervix: Secondary | ICD-10-CM

## 2023-03-09 DIAGNOSIS — Z853 Personal history of malignant neoplasm of breast: Secondary | ICD-10-CM

## 2023-03-09 DIAGNOSIS — N6323 Unspecified lump in the left breast, lower outer quadrant: Secondary | ICD-10-CM

## 2023-03-09 DIAGNOSIS — R9389 Abnormal findings on diagnostic imaging of other specified body structures: Secondary | ICD-10-CM | POA: Diagnosis not present

## 2023-03-09 NOTE — Progress Notes (Signed)
Patient ID: [name], female    DOB: 05/16/1955, 68 y.o.   MRN: [id number]   Assessment & Plan:  Screening for cervical cancer -     Cytology - PAP  Mass of lower outer quadrant of left breast -     MM 3D DIAGNOSTIC MAMMOGRAM BILATERAL BREAST; Future  Abnormal chest CT  Right middle lobe pulmonary nodule  Personal history of malignant neoplasm of breast -     MM 3D DIAGNOSTIC MAMMOGRAM BILATERAL BREAST; Future      Pulmonary Nodule 10mm irregular nodule in the right middle lobe identified on CT scan in December 2024. No follow-up appointment with pulmonologist has been scheduled yet. -Scheduled appointment with pulmonologist for 03/16/2023 at 1pm.  Breast Nodule Palpable, firm, non-mobile, non-tender nodule less than 1cm in size on the lower left part of the breast. History of breast cancer treated with lumpectomy, chemotherapy, and radiation in 1996. -Order diagnostic mammogram.  Routine Gynecological Care Updated Pap smear per oncologist's request. No vaginal symptoms reported. -Completed Pap smear during today's visit.  Follow-up in April 2025 to check on patient's condition. Patient advised to call if any questions or concerns arise.        Subjective:    Chief Complaint  Patient presents with   Medical Management of Chronic Issues    Pt in office to discuss diagnostic mammogram; per visit with Oncologist requested pt to complete a Pap Smear; called [facility] to get faxed documentation of all recent vaccines/immunizations administered in the pharmacy, nothing reported to [facility]    HPI Discussed the use of AI scribe software for clinical note transcription with the patient, who gave verbal consent to proceed.  History of Present Illness   The patient, with a history of breast cancer treated with lumpectomy, chemotherapy, and radiation in 1996, presents with concerns about a palpable nodule on her left breast. The nodule, described as firm, nonmobile,  and nontender, is less than a centimeter in size and located on the lower part of the breast. The patient reports that the size of the nodule has not changed.  In addition to the breast nodule, the patient had an abnormal CT scan of the chest in December 2024, which showed a right middle lobe pulmonary nodule measuring 10mm with irregular borders. The patient expresses frustration about the lack of follow-up appointments with the pulmonologist and oncologist, which is causing her financial stress due to missed work.       Past Medical History:  Diagnosis Date   Anxiety    Aortic stenosis, mild    echo 2017   Arthritis    Breast cancer (HCC) 1996   left breast   Carpal tunnel syndrome    Cataract    Chronic diastolic CHF (congestive heart failure) (HCC)    NYHA class II   COVID-19    12/02/21   Diabetes mellitus without complication (HCC)    Hyperlipidemia    Hypertension    Left bundle branch block (LBBB)    Low back pain    Neuropathy    NICM (nonischemic cardiomyopathy) (HCC)    EF 30%, cath 05/20/2015 clean coronary.  EF resolved by echo with EF 55%   OSA on CPAP    Prediabetes    Pulmonary HTN (HCC) 05/2015   Severe with PASP 67/62mmHg - resolved on followup echo   PVC's (premature ventricular contractions) 11/04/2015   Sleep apnea    cpap    Past Surgical History:  Procedure Laterality  Date   BREAST SURGERY     CARDIAC CATHETERIZATION N/A 05/20/2015   Procedure: Right/Left Heart Cath and Coronary Angiography;  Surgeon: Lennette Bihari, MD;  Location: [facility] IN[facility] CV LAB;  Service: Cardiovascular;  Laterality: N/A;   CATARACT EXTRACTION W/ INTRAOCULAR LENS  IMPLANT, BILATERAL     COLONOSCOPY  2018   DILATION AND CURETTAGE OF UTERUS     lumpectomy left breast     POLYPECTOMY     UTERINE FIBROID SURGERY      Family History  Problem Relation Age of Onset   Arthritis Mother    Depression Mother    Hearing loss Mother    Hyperlipidemia Mother    Miscarriages /  Stillbirths Mother    Colon cancer Mother 67       2010   Colon polyps Mother    Alcohol abuse Father    Cancer Father        PROSTATE   Depression Father    Hypertension Father    Hyperlipidemia Father    Drug abuse Brother    Early death Maternal Grandmother    Cancer Paternal Grandmother    Heart attack Neg Hx    Diabetes Neg Hx    Esophageal cancer Neg Hx    Rectal cancer Neg Hx    Stomach cancer Neg Hx     Social History   Tobacco Use   Smoking status: Former    Current packs/day: 0.00    Types: Cigarettes    Quit date: 06/18/2009    Years since quitting: 13.7   Smokeless tobacco: Never  Vaping Use   Vaping status: Never Used  Substance Use Topics   Alcohol use: Yes    Comment: once every 2 months   Drug use: No     Allergies  Allergen Reactions   Jardiance [Empagliflozin] Anaphylaxis, Itching, Rash and Other (See Comments)    Yeast infection   Atorvastatin Other (See Comments)    REACTION: mylagias   Pravastatin Sodium Other (See Comments)    REACTION: myalgias   Sulfa Antibiotics Hives    Review of Systems NEGATIVE UNLESS OTHERWISE INDICATED IN HPI      Objective:     BP 102/68 (BP Location: Left Arm, Patient Position: Sitting)   Pulse 66   Temp (!) 97.1 F (36.2 C) (Temporal)   Ht 5\' 4"  (1.626 m)   Wt 220 lb 12.8 oz (100.2 kg)   SpO2 98%   BMI 37.90 kg/m   Wt Readings from Last 3 Encounters:  03/09/23 220 lb 12.8 oz (100.2 kg)  03/08/23 225 lb (102.1 kg)  02/22/23 226 lb 6.4 oz (102.7 kg)    BP Readings from Last 3 Encounters:  03/09/23 102/68  02/22/23 120/69  01/31/23 100/64     Physical Exam Vitals and nursing note reviewed. Exam conducted with a chaperone present.  Constitutional:      Appearance: Normal appearance. She is normal weight. She is not toxic-appearing.  HENT:     Head: Normocephalic and atraumatic.     Right Ear: External ear normal.     Left Ear: External ear normal.  Eyes:     Extraocular Movements:  Extraocular movements intact.     Conjunctiva/sclera: Conjunctivae normal.     Pupils: Pupils are equal, round, and reactive to light.  Neck:     Thyroid: No thyroid mass, thyromegaly or thyroid tenderness.  Cardiovascular:     Rate and Rhythm: Normal rate and regular rhythm.  Pulses: Normal pulses.     Heart sounds: Normal heart sounds.  Pulmonary:     Effort: Pulmonary effort is normal.     Breath sounds: Normal breath sounds.  Chest:     Chest wall: No mass.  Breasts:    Right: Normal. No swelling, bleeding, inverted nipple, mass, nipple discharge, skin change or tenderness.     Left: Mass and skin change (prior lumpectomy) present. No swelling, bleeding, inverted nipple, nipple discharge or tenderness.     Comments: Palpable nodule on lower left breast, less than 1 cm, firm, non-mobile, nontender. Abdominal:     General: Abdomen is flat. Bowel sounds are normal.     Palpations: Abdomen is soft.     Tenderness: There is no right CVA tenderness or left CVA tenderness.  Genitourinary:    General: Normal vulva.     Labia:        Right: No rash, tenderness or lesion.        Left: No rash, tenderness or lesion.      Vagina: Normal.     Cervix: Normal.     Uterus: Normal.      Adnexa: Right adnexa normal and left adnexa normal.  Musculoskeletal:        General: Normal range of motion.     Cervical back: Normal range of motion and neck supple.     Right lower leg: No edema.     Left lower leg: No edema.  Lymphadenopathy:     Cervical: No cervical adenopathy.     Upper Body:     Right upper body: No supraclavicular, axillary or pectoral adenopathy.     Left upper body: No supraclavicular, axillary or pectoral adenopathy.  Skin:    General: Skin is warm and dry.     Findings: No lesion.  Neurological:     General: No focal deficit present.     Mental Status: She is alert and oriented to person, place, and time.  Psychiatric:        Mood and Affect: Mood normal.         Behavior: Behavior normal.       Ailyne Pawley M Jakala Herford, PA-C

## 2023-03-09 NOTE — Patient Instructions (Addendum)
You are scheduled to see Dr Delton Coombes at [facility] on Thursday, January 30th at 1:00 pm. Please arrive 15 minutes prior to appointment for check in.   [facility] [address], [place], [place] [address] Phone: [phone]    VISIT SUMMARY:  During today's visit, we addressed your concerns about a palpable nodule on your left breast and discussed the findings of an abnormal CT scan of your chest from December 2024. We also completed your routine gynecological care, including an updated Pap smear.  YOUR PLAN:  -PULMONARY NODULE: A pulmonary nodule is a small, round growth in the lung. Your CT scan showed a 10mm irregular nodule in the right middle lobe of your lung. We have scheduled a follow-up appointment with a pulmonologist on March 16, 2023, at 1pm to further evaluate this finding.  -BREAST NODULE: A breast nodule is a small lump in the breast tissue. You have a firm, non-mobile, non-tender nodule less than 1cm in size on the lower left part of your breast. Given your history of breast cancer, we have ordered a diagnostic mammogram to investigate this further.  -ROUTINE GYNECOLOGICAL CARE: Routine gynecological care includes regular check-ups and screenings to maintain reproductive health. We completed your Pap smear today as requested by your oncologist. You did not report any vaginal symptoms.  INSTRUCTIONS:  Please follow up with the pulmonologist on March 16, 2023, at 1pm for the evaluation of your pulmonary nodule. Additionally, we will follow up with you in April 2025 to check on your condition. If you have any questions or concerns before then, please do not hesitate to call.

## 2023-03-10 ENCOUNTER — Other Ambulatory Visit: Payer: Self-pay | Admitting: Physician Assistant

## 2023-03-10 DIAGNOSIS — Z853 Personal history of malignant neoplasm of breast: Secondary | ICD-10-CM

## 2023-03-10 DIAGNOSIS — N6323 Unspecified lump in the left breast, lower outer quadrant: Secondary | ICD-10-CM

## 2023-03-13 ENCOUNTER — Ambulatory Visit (HOSPITAL_COMMUNITY): Payer: Medicare Other | Attending: Physician Assistant

## 2023-03-13 DIAGNOSIS — I350 Nonrheumatic aortic (valve) stenosis: Secondary | ICD-10-CM

## 2023-03-13 DIAGNOSIS — I5042 Chronic combined systolic (congestive) and diastolic (congestive) heart failure: Secondary | ICD-10-CM

## 2023-03-13 LAB — ECHOCARDIOGRAM COMPLETE
AR max vel: 2.07 cm2
AV Area VTI: 0.79 cm2
AV Area mean vel: 1.71 cm2
AV Mean grad: 31 mm[Hg]
AV Peak grad: 41.1 mm[Hg]
AV Vena cont: 0.17 cm
Ao pk vel: 3.21 m/s
Area-P 1/2: 5.13 cm2
Est EF: 45
S' Lateral: 4.37 cm

## 2023-03-14 ENCOUNTER — Encounter: Payer: Self-pay | Admitting: Physician Assistant

## 2023-03-14 ENCOUNTER — Telehealth: Payer: Self-pay | Admitting: Cardiology

## 2023-03-14 ENCOUNTER — Telehealth: Payer: Self-pay

## 2023-03-14 LAB — CYTOLOGY - PAP
Chlamydia: NEGATIVE
Comment: NEGATIVE
Comment: NEGATIVE
Comment: NEGATIVE
Comment: NORMAL
Diagnosis: NEGATIVE
High risk HPV: NEGATIVE
Neisseria Gonorrhea: NEGATIVE
Trichomonas: NEGATIVE

## 2023-03-14 NOTE — Telephone Encounter (Signed)
John, Please review this patient's Echo report from 03/13/2023.  It appears this patient's [facility] procedure will need to be moved to the hospital.  Please confirm this information as this patient's PV appt has already been completed and her procedure is currently scheduled on 03/22/2023 in the [facility] and will need to be rescheduled if this information is confirmed.  Thank you Bre, PV RN

## 2023-03-14 NOTE — Telephone Encounter (Signed)
Called pt advised provider has not reviewed Echo results.  Once reviewed an explanation will be placed on my chart or we will call. Pt report has 2 medical conditions going on.  Has an oncologist for breast and lung nodule.  Oncologist referred to specialist for lung nodule has an OV on 03/16/23.  If surgery is needed pt will need Aortic Valve status and possible surgery. Advised pt to go to OV on 03/16/23 see what specialist says and go from there.  If surgery is needed specialist will notify our office to get a medical clearance.  Pt then asked what if she needs AV surgery.  Advised pt to take it one step at a time so doesn't overwhelm herself. Pt reports is off work this week and would like to get things done this week.  Advised I will send message to provider and will go from there.

## 2023-03-14 NOTE — Telephone Encounter (Signed)
[facility] procedure cancelled; awaiting contact with patient;

## 2023-03-14 NOTE — Telephone Encounter (Signed)
Dr. Adela Lank (sending to Jan as well as Sharol Harness), Please review previous message and advise if you would like to see the patient at an OV or can she be a direct at the hospital.  Side note: the patient as already completed a PV appt as she was originally scheduled  in [facility], however, ECHO results completed on 03/13/2023 with updated information we needed to be aware of. Please advise as patient will need to be contacted with this information Thank you Bre, PV RN

## 2023-03-14 NOTE — Telephone Encounter (Signed)
Dr. Adela Lank is out of the office this week. Will await his review upon his return.

## 2023-03-14 NOTE — Telephone Encounter (Signed)
Patient calling about her echo results. Please advise

## 2023-03-14 NOTE — Telephone Encounter (Signed)
Attempted to reach patient at phone number [phone] concerning need to be scheduled for an OV and having her [facility] procedure cancelled; Will attempt to reach patient at a later time/date;

## 2023-03-15 ENCOUNTER — Encounter: Payer: Self-pay | Admitting: Physician Assistant

## 2023-03-15 NOTE — Telephone Encounter (Signed)
Called and spoke with patient- patient advised of Dr. Lanetta Inch recommendations and is requsting to hold off on the colonoscopy at this time;  she understands that her colonoscopy will need to be scheduled at the hospital when she is scheduled- patient will need to be put on the recall list (patient is requesting to be contacted in 3-6 months to see if she is appropriate for the colonoscopy) fr the hospital;   [facility], Please place this patient on the hospital recall list for 3-6 months from now- she understands she will need an OV with Dr. Retia Passe or one of the APP's prior to being scheduled at the hospital; Thank you Bre

## 2023-03-15 NOTE — Telephone Encounter (Signed)
Called and scheduled patient for OV with Dr. Adela Lank in April to discuss colon at [facility].  Placed on Hospital wait list.

## 2023-03-16 ENCOUNTER — Ambulatory Visit: Payer: Medicare Other | Admitting: Emergency Medicine

## 2023-03-16 ENCOUNTER — Ambulatory Visit
Admission: RE | Admit: 2023-03-16 | Discharge: 2023-03-16 | Disposition: A | Discharge status: Home / Self Care | Payer: Medicare Other | Source: Ambulatory Visit | Attending: Physician Assistant | Admitting: Physician Assistant

## 2023-03-16 ENCOUNTER — Encounter: Payer: Self-pay | Admitting: Emergency Medicine

## 2023-03-16 VITALS — BP 92/52 | HR 79 | Temp 97.2°F | Ht 64.0 in | Wt 223.4 lb

## 2023-03-16 DIAGNOSIS — R911 Solitary pulmonary nodule: Secondary | ICD-10-CM | POA: Diagnosis not present

## 2023-03-16 DIAGNOSIS — R918 Other nonspecific abnormal finding of lung field: Secondary | ICD-10-CM | POA: Insufficient documentation

## 2023-03-16 DIAGNOSIS — J301 Allergic rhinitis due to pollen: Secondary | ICD-10-CM

## 2023-03-16 DIAGNOSIS — I350 Nonrheumatic aortic (valve) stenosis: Secondary | ICD-10-CM

## 2023-03-16 DIAGNOSIS — G4733 Obstructive sleep apnea (adult) (pediatric): Secondary | ICD-10-CM | POA: Diagnosis not present

## 2023-03-16 DIAGNOSIS — N6323 Unspecified lump in the left breast, lower outer quadrant: Secondary | ICD-10-CM

## 2023-03-16 DIAGNOSIS — I5042 Chronic combined systolic (congestive) and diastolic (congestive) heart failure: Secondary | ICD-10-CM

## 2023-03-16 DIAGNOSIS — J309 Allergic rhinitis, unspecified: Secondary | ICD-10-CM | POA: Insufficient documentation

## 2023-03-16 DIAGNOSIS — Z853 Personal history of malignant neoplasm of breast: Secondary | ICD-10-CM

## 2023-03-16 DIAGNOSIS — N641 Fat necrosis of breast: Secondary | ICD-10-CM | POA: Diagnosis not present

## 2023-03-16 DIAGNOSIS — C50919 Malignant neoplasm of unspecified site of unspecified female breast: Secondary | ICD-10-CM

## 2023-03-16 NOTE — Assessment & Plan Note (Signed)
Pulmonary Nodules Multiple bilateral pulmonary nodules identified on CT scan, with the most prominent being a 10mm right ventral pulmonary nodule. History of smoking and breast cancer. Moderate suspicion for malignancy. No symptoms of cough or shortness of breath. -Order combined PET/CT scan in mid-March 2025 to assess metabolic activity and changes in size of nodules. -Consider bronchoscopy for biopsy depending on imaging results and cardiology clearance.

## 2023-03-16 NOTE — Assessment & Plan Note (Signed)
Breast Cancer (History) Treated with lumpectomy, radiation, and chemotherapy in 1996. No recurrence. -Continue routine follow-up and screening as per oncology guidelines.

## 2023-03-16 NOTE — Assessment & Plan Note (Signed)
Severe Aortic Valve Calcification Identified on echocardiogram from 03/13/2023. No symptoms of shortness of breath or decreased activity level. -Refer to cardiologist (Dr. Mayford Knife) for further evaluation and management. -May need intervention given the progression of her AV calcification prior to general anesthesia for bronchoscopy

## 2023-03-16 NOTE — Assessment & Plan Note (Signed)
Symptoms of postnasal drip and cough, possibly exacerbated by CPAP. -Continue Benadryl as needed for symptoms.

## 2023-03-16 NOTE — Progress Notes (Signed)
Subjective:    Patient ID: [name], female    DOB: 06/09/1955, 69 y.o.   MRN: [id number]  HPI The patient is a 68 year old female with a history of breast cancer, diabetes, hypertension, non-ischemic cardiomyopathy, obstructive sleep apnea, and pulmonary hypertension who presents with an abnormal CT scan of the chest. She was referred by Dr. Arbutus Ped for evaluation of a right middle lobe pulmonary nodule.  An abnormal CT scan of the chest performed on January 16, 2023, revealed thickening of the distal esophagus with a small hernia, small mediastinal nodes, a 10x10 mm right middle lobe pulmonary nodule, and multiple other small punctate nodules present bilaterally, including a 3 mm left upper lobe nodule, a 5 mm left lower lobe nodule, and a 2 mm subpleural right upper lobe nodule.  In September 2024, she was hospitalized for right lower lobe pneumonia, initially suspected to be COVID-19, but was confirmed as pneumonia. A follow-up chest X-ray showed an area of concern, leading to the CT scan in December.  She has a significant past medical history including breast cancer treated in 1996 with lumpectomy, radiation, and chemotherapy. She recently had a breast exam due to a nodule under the same breast, which was determined to be calcification. She also had a recent Pap smear which was negative.  She has a history of aortic valve stenosis, which has progressed from mild to severe calcification as noted on an echocardiogram from March 13, 2023.  She is a former smoker, having smoked a pack a day for over 20 years, starting at age 61, and quit smoking over 20 years ago. 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 No significant shortness of breath or limitations in her activities.  She uses CPAP therapy nightly for obstructive sleep apnea and reports compliance with the device. She experiences coughing, particularly in the morning, which she  attributes to allergies and CPAP use. She occasionally takes Benadryl for allergy symptoms.   RADIOLOGY Chest CT: Thickening of the distal esophagus with a small hernia, small mediastinal nodes (none pathologically enlarged), 10x10 mm right ventral pulmonary nodule, multiple small punctate nodules bilaterally including 3 mm left upper lobe nodule, 5 mm left lower lobe nodule, 2 mm subpleural right upper lobe nodule (01/16/2023)  DIAGNOSTIC Echocardiogram: Severe aortic valve calcification (03/13/2023)   Review of Systems As per HPI  Past Medical History:  Diagnosis Date   Anxiety    Aortic stenosis, mild    echo 2017   Arthritis    Breast cancer (HCC) 1996   left breast   Carpal tunnel syndrome    Cataract    Chronic diastolic CHF (congestive heart failure) (HCC)    NYHA class II   COVID-19    12/02/21   Diabetes mellitus without complication (HCC)    Hyperlipidemia    Hypertension    Left bundle branch block (LBBB)    Low back pain    Neuropathy    NICM (nonischemic cardiomyopathy) (HCC)    EF 30%, cath 05/20/2015 clean coronary.  EF resolved by echo with EF 55%   OSA on CPAP    Prediabetes    Pulmonary HTN (HCC) 05/2015   Severe with PASP 67/70mmHg - resolved on followup echo   PVC's (premature ventricular contractions) 11/04/2015   Sleep apnea    cpap     Family History  Problem Relation Age of Onset   Arthritis Mother    Depression Mother  Hearing loss Mother    Hyperlipidemia Mother    Miscarriages / [place] Mother    Colon cancer Mother 29       2010   Colon polyps Mother    Alcohol abuse Father    Cancer Father        PROSTATE   Depression Father    Hypertension Father    Hyperlipidemia Father    Drug abuse Brother    Early death Maternal Grandmother    Cancer Paternal Grandmother    Heart attack Neg Hx    Diabetes Neg Hx    Esophageal cancer Neg Hx    Rectal cancer Neg Hx    Stomach cancer Neg Hx      Social History   Socioeconomic  History   Marital status: Significant Other    Spouse name: Not on file   Number of children: Not on file   Years of education: Not on file   Highest education level: Not on file  Occupational History   Occupation: Truck [employer] doctor  Tobacco Use   Smoking status: Former    Current packs/day: 0.00    Types: Cigarettes    Quit date: 06/18/2009    Years since quitting: 13.7   Smokeless tobacco: Never  Vaping Use   Vaping status: Never Used  Substance and Sexual Activity   Alcohol use: Yes    Comment: once every 2 months   Drug use: No   Sexual activity: Yes  Other Topics Concern   Not on file  Social History Narrative   ** Merged History Encounter **       Social Drivers of Health   Financial Resource Strain: Low Risk  (04/26/2022)   Overall Financial Resource Strain (CARDIA)    Difficulty of Paying Living Expenses: Not hard at all  Food Insecurity: No Food Insecurity (11/01/2022)   Hunger Vital Sign    Worried About Running Out of Food in the Last Year: Never true    Ran Out of Food in the Last Year: Never true  Transportation Needs: No Transportation Needs (11/01/2022)   PRAPARE - Administrator, Civil Service (Medical): No    Lack of Transportation (Non-Medical): No  Physical Activity: Inactive (04/26/2022)   Exercise Vital Sign    Days of Exercise per Week: 0 days    Minutes of Exercise per Session: 0 min  Stress: Stress Concern Present (04/26/2022)   [employer] of Occupational Health - Occupational Stress Questionnaire    Feeling of Stress : To some extent  Social Connections: Socially Isolated (04/26/2022)   Social Connection and Isolation Panel [NHANES]    Frequency of Communication with Friends and Family: More than three times a week    Frequency of Social Gatherings with Friends and Family: More than three times a week    Attends Religious Services: Never    Database administrator or Organizations: No    Attends Banker Meetings: Never     Marital Status: Never married  Intimate Partner Violence: Not At Risk (11/01/2022)   Humiliation, Afraid, Rape, and Kick questionnaire    Fear of Current or Ex-Partner: No    Emotionally Abused: No    Physically Abused: No    Sexually Abused: No    - Former smoker (1 pack a day for 20 years) - Occupation: Art therapist  Allergies  Allergen Reactions   Jardiance [Empagliflozin] Anaphylaxis, Itching, Rash and Other (See Comments)    Yeast infection   Atorvastatin  Other (See Comments)    REACTION: mylagias   Pravastatin Sodium Other (See Comments)    REACTION: myalgias   Sulfa Antibiotics Hives     Outpatient Medications Prior to Visit  Medication Sig Dispense Refill   acetaminophen (TYLENOL) 650 MG CR tablet Take 1,300 mg by mouth as needed for pain.     aspirin 81 MG tablet Take 81 mg by mouth daily.     Blood Glucose Monitoring Suppl (ONETOUCH VERIO FLEX SYSTEM) w/Device KIT Use onetouch verio flex meter to check blood sugar 3 times weekly, alternating between fasting and 2 hours after meals. 1 kit 0   carvedilol (COREG) 6.25 MG tablet TAKE 1 TABLET BY MOUTH TWICE DAILY WITH A MEAL 180 tablet 3   clotrimazole (LOTRIMIN) 1 % cream Apply 1 Application topically 2 (two) times daily. 60 g 0   Cyanocobalamin (VITAMIN B 12 PO) Take by mouth.     diclofenac Sodium (VOLTAREN) 1 % GEL Apply 4 g topically 4 (four) times daily. Arthritis 350 g 1   diphenhydrAMINE HCl (BENADRYL ALLERGY PO) Take 1 tablet by mouth as needed.     ezetimibe (ZETIA) 10 MG tablet Take 1 tablet by mouth once daily 90 tablet 2   furosemide (LASIX) 40 MG tablet TAKE 1 TO 2 TABLETS BY MOUTH ONCE DAILY AS DIRECTED 180 tablet 2   glucose blood (ONETOUCH VERIO) test strip USE 1 STRIP TO CHECK BLOOD SUGAR TWO TO THREE TIMES A DAY 300 each 2   metFORMIN (GLUCOPHAGE-XR) 500 MG 24 hr tablet 3 tabs qd 270 tablet 3   Multiple Vitamin (MULTIVITAMIN) tablet Take 1 tablet by mouth daily.     OneTouch Delica Lancets 30G MISC  Use OneTouch Delica lancets to check blood sugar 3 times weekly, alternating between fasting and 2 hours after meals. 300 each 2   PEG 3350-KCl-NaCl-NaSulf-MgSul (SUFLAVE) 178.7 g SOLR Take 1 kit by mouth as directed. 1 each 0   rosuvastatin (CRESTOR) 5 MG tablet Take 1 tablet by mouth once daily 90 tablet 0   sacubitril-valsartan (ENTRESTO) 24-26 MG Take 1 tablet by mouth 2 (two) times daily. 180 tablet 0   spironolactone (ALDACTONE) 25 MG tablet TAKE 1/2 (ONE-HALF) TABLET BY MOUTH ONCE DAILY . 45 tablet 3   vitamin C (ASCORBIC ACID) 500 MG tablet Take 2,000 mg by mouth as needed.      No facility-administered medications prior to visit.        Objective:   Physical Exam  Vitals:   03/16/23 1247  BP: (!) 92/52  Pulse: 79  Temp: (!) 97.2 F (36.2 C)  TempSrc: Oral  SpO2: 98%  Weight: 223 lb 6.4 oz (101.3 kg)  Height: 5\' 4"  (1.626 m)   Gen: Pleasant, overweight woman, in no distress,  normal affect  ENT: No lesions,  mouth clear,  oropharynx clear, no postnasal drip  Neck: No JVD, no stridor  Lungs: No use of accessory muscles, no crackles or wheezing on normal respiration, no wheeze on forced expiration  Cardiovascular: RRR, 2/6 systolic murmur  Musculoskeletal: No deformities, no cyanosis or clubbing  Neuro: alert, awake, non focal  Skin: Warm, no lesions or rash      Assessment & Plan:  Pulmonary nodules Pulmonary Nodules Multiple bilateral pulmonary nodules identified on CT scan, with the most prominent being a 10mm right ventral pulmonary nodule. History of smoking and breast cancer. Moderate suspicion for malignancy. No symptoms of cough or shortness of breath. -Order combined PET/CT scan in mid-March 2025 to  assess metabolic activity and changes in size of nodules. -Consider bronchoscopy for biopsy depending on imaging results and cardiology clearance.   Aortic stenosis, mild Severe Aortic Valve Calcification Identified on echocardiogram from 03/13/2023. No  symptoms of shortness of breath or decreased activity level. -Refer to cardiologist (Dr. Mayford Knife) for further evaluation and management. -May need intervention given the progression of her AV calcification prior to general anesthesia for bronchoscopy   OSA (obstructive sleep apnea) Obstructive Sleep Apnea On CPAP therapy with good compliance. -Continue CPAP therapy nightly.  Allergic rhinitis Symptoms of postnasal drip and cough, possibly exacerbated by CPAP. -Continue Benadryl as needed for symptoms.  Malignant tumor of breast (HCC) Breast Cancer (History) Treated with lumpectomy, radiation, and chemotherapy in 1996. No recurrence. -Continue routine follow-up and screening as per oncology guidelines.   Levy Pupa, MD, PhD 03/16/2023, 3:20 PM [facility] [phone] or if no answer before 7:00PM call [phone] For any issues after 7:00PM please call eLink [phone]

## 2023-03-16 NOTE — Assessment & Plan Note (Signed)
Obstructive Sleep Apnea On CPAP therapy with good compliance. -Continue CPAP therapy nightly.

## 2023-03-16 NOTE — Patient Instructions (Signed)
VISIT SUMMARY:  Today, we discussed the findings from your recent CT scan and reviewed your overall health status. We focused on the pulmonary nodules found in your lungs, your severe aortic valve calcification, and your history of breast cancer. We also touched on your obstructive sleep apnea and allergies.  YOUR PLAN:  -PULMONARY NODULES: Pulmonary nodules are small growths in the lungs. Given your history of smoking and breast cancer, there is a moderate suspicion for malignancy. We will order a combined PET/CT scan in mid-March 2025 to assess the activity and size of these nodules. Depending on the results and cardiology clearance, a bronchoscopy for biopsy may be considered.  -SEVERE AORTIC VALVE CALCIFICATION: Severe aortic valve calcification means that your aortic valve has significant calcium buildup, which can affect its function. Although you have no symptoms, we will refer you to Dr. Mayford Knife, for further evaluation and management, any appropriate care that needs to be done in preparation for possible bronchoscopy  -BREAST CANCER (HISTORY): You have a history of breast cancer treated in 1996 with lumpectomy, radiation, and chemotherapy. There has been no recurrence. We will continue routine follow-up and screening as per oncology guidelines.  -OBSTRUCTIVE SLEEP APNEA: Obstructive sleep apnea is a condition where your breathing stops and starts during sleep. You are using CPAP therapy with good compliance. Please continue using your CPAP device nightly.  -ALLERGIES: You have symptoms of postnasal drip and cough, which may be worsened by your CPAP use. Continue taking Benadryl as needed for these symptoms.  INSTRUCTIONS:  Please schedule a combined PET/CT scan for mid-March 2025. Follow up with Dr. Mayford Knife, the cardiologist, for your aortic valve calcification. Continue with your routine breast cancer screenings and CPAP therapy. Use Benadryl as needed for allergy symptoms.

## 2023-03-18 ENCOUNTER — Encounter: Payer: Self-pay | Admitting: Physician Assistant

## 2023-03-20 ENCOUNTER — Ambulatory Visit: Payer: Medicare Other | Attending: Cardiovascular Disease | Admitting: Cardiovascular Disease

## 2023-03-20 ENCOUNTER — Other Ambulatory Visit: Payer: Self-pay

## 2023-03-20 ENCOUNTER — Encounter: Payer: Self-pay | Admitting: Cardiovascular Disease

## 2023-03-20 VITALS — BP 100/68 | HR 67 | Ht 64.0 in | Wt 222.0 lb

## 2023-03-20 DIAGNOSIS — I350 Nonrheumatic aortic (valve) stenosis: Secondary | ICD-10-CM

## 2023-03-20 NOTE — Patient Instructions (Signed)
Medication Instructions:  No changes *If you need a refill on your cardiac medications before your next appointment, please call your pharmacy*   Lab Work: None If you have labs (blood work) drawn today and your tests are completely normal, you will receive your results only by: MyChart Message (if you have MyChart) OR A paper copy in the mail If you have any lab test that is abnormal or we need to change your treatment, we will call you to review the results.   Testing/Procedures: CT scans - chest, abdomen, pelvis and heart - see letter provided today   Follow-Up: Per Structural Heart Team  Other Instructions

## 2023-03-20 NOTE — Progress Notes (Signed)
[facility] Consult Note  Chief Complaint  Patient presents with   New Patient (Initial Visit)    Aortic stenosis   History of Present Illness: 68 yo female with history of chronic combined systolic and diastolic CHF, HTN, hyperlipidemia, peripheral neuropathy, PACs, PVCs, LBBB, sleep apnea, former tobacco abuse, obesity, DM and aortic stenosis who is here today as a new consult, referred by Dr. Mayford Knife, for further discussion regarding her aortic stenosis and possible TAVR. She has had reduced LV systolic dating back to 2015 with LVEF around 30% at that time. Cardiac cath in 2015 with no evidence of CAD. She has been followed for moderate aortic stenosis. She was seen in our office in July 2024 by Jari Favre, PA and was doing well. Echo January 2025 with LVEF=45% with global hypokinesis. Grade 2 diastolic dysfunction. Normal RV function. The aortic valve leaflets are thickened and calcified. Severe low flow/low gradient aortic stenosis with mean gradient 31 mmHg, AVA 0.79 cm2, SVI 32, DI 0.26.   She tells me today that she feels well overall. She denies dyspnea or chest pain. She is very active. No dizziness or near syncope. No LE edema. She lives in [place], [place]. She works as a Midwife for [employer]. She has no active dental issues.   Primary Care Physician: [name], Crist Infante, PA-C Primary Cardiologist: Mayford Knife Referring Cardiologist: Mayford Knife  Past Medical History:  Diagnosis Date   Anxiety    Aortic stenosis, mild    echo 2017   Arthritis    Breast cancer (HCC) 1996   left breast   Carpal tunnel syndrome    Cataract    Chronic diastolic CHF (congestive heart failure) (HCC)    NYHA class II   COVID-19    12/02/21   Diabetes mellitus without complication (HCC)    Hyperlipidemia    Hypertension    Left bundle branch block (LBBB)    Low back pain    Neuropathy    NICM (nonischemic cardiomyopathy) (HCC)    EF 30%, cath 05/20/2015 clean coronary.  EF resolved by  echo with EF 55%   OSA on CPAP    Prediabetes    Pulmonary HTN (HCC) 05/2015   Severe with PASP 67/33mmHg - resolved on followup echo   PVC's (premature ventricular contractions) 11/04/2015   Sleep apnea    cpap    Past Surgical History:  Procedure Laterality Date   BREAST SURGERY     CARDIAC CATHETERIZATION N/A 05/20/2015   Procedure: Right/Left Heart Cath and Coronary Angiography;  Surgeon: Lennette Bihari, MD;  Location: [facility];  Service: Cardiovascular;  Laterality: N/A;   CATARACT EXTRACTION W/ INTRAOCULAR LENS  IMPLANT, BILATERAL     COLONOSCOPY  2018   DILATION AND CURETTAGE OF UTERUS     lumpectomy left breast     POLYPECTOMY     UTERINE FIBROID SURGERY      Current Outpatient Medications  Medication Sig Dispense Refill   acetaminophen (TYLENOL) 650 MG CR tablet Take 1,300 mg by mouth as needed for pain.     aspirin 81 MG tablet Take 81 mg by mouth daily.     Blood Glucose Monitoring Suppl (ONETOUCH VERIO FLEX SYSTEM) w/Device KIT Use onetouch verio flex meter to check blood sugar 3 times weekly, alternating between fasting and 2 hours after meals. 1 kit 0   carvedilol (COREG) 6.25 MG tablet TAKE 1 TABLET BY MOUTH TWICE DAILY WITH A MEAL 180 tablet 3   clotrimazole (LOTRIMIN)  1 % cream Apply 1 Application topically 2 (two) times daily. 60 g 0   Cyanocobalamin (VITAMIN B 12 PO) Take by mouth.     diclofenac Sodium (VOLTAREN) 1 % GEL Apply 4 g topically 4 (four) times daily. Arthritis 350 g 1   diphenhydrAMINE HCl (BENADRYL ALLERGY PO) Take 1 tablet by mouth as needed.     ezetimibe (ZETIA) 10 MG tablet Take 1 tablet by mouth once daily 90 tablet 2   furosemide (LASIX) 40 MG tablet TAKE 1 TO 2 TABLETS BY MOUTH ONCE DAILY AS DIRECTED 180 tablet 2   glucose blood (ONETOUCH VERIO) test strip USE 1 STRIP TO CHECK BLOOD SUGAR TWO TO THREE TIMES A DAY 300 each 2   metFORMIN (GLUCOPHAGE-XR) 500 MG 24 hr tablet 3 tabs qd 270 tablet 3   Multiple Vitamin (MULTIVITAMIN)  tablet Take 1 tablet by mouth daily.     OneTouch Delica Lancets 30G MISC Use OneTouch Delica lancets to check blood sugar 3 times weekly, alternating between fasting and 2 hours after meals. 300 each 2   PEG 3350-KCl-NaCl-NaSulf-MgSul (SUFLAVE) 178.7 g SOLR Take 1 kit by mouth as directed. 1 each 0   rosuvastatin (CRESTOR) 5 MG tablet Take 1 tablet by mouth once daily 90 tablet 0   sacubitril-valsartan (ENTRESTO) 24-26 MG Take 1 tablet by mouth 2 (two) times daily. 180 tablet 0   spironolactone (ALDACTONE) 25 MG tablet TAKE 1/2 (ONE-HALF) TABLET BY MOUTH ONCE DAILY . 45 tablet 3   vitamin C (ASCORBIC ACID) 500 MG tablet Take 2,000 mg by mouth as needed.      No current facility-administered medications for this visit.    Allergies  Allergen Reactions   Jardiance [Empagliflozin] Anaphylaxis, Itching, Rash and Other (See Comments)    Yeast infection   Atorvastatin Other (See Comments)    REACTION: mylagias   Pravastatin Sodium Other (See Comments)    REACTION: myalgias   Sulfa Antibiotics Hives    Social History   Socioeconomic History   Marital status: Significant Other    Spouse name: Not on file   Number of children: 0   Years of education: Not on file   Highest education level: Not on file  Occupational History   Occupation: [employer] doctor   Occupation: Environmental manager  Tobacco Use   Smoking status: Former    Current packs/day: 0.00    Types: Cigarettes    Quit date: 06/18/2009    Years since quitting: 13.7   Smokeless tobacco: Never  Vaping Use   Vaping status: Never Used  Substance and Sexual Activity   Alcohol use: Yes    Comment: once every 2 months   Drug use: No   Sexual activity: Yes  Other Topics Concern   Not on file  Social History Narrative   ** Merged History Encounter **       Social Drivers of Health   Financial Resource Strain: Low Risk  (04/26/2022)   Overall Financial Resource Strain (CARDIA)    Difficulty of Paying Living Expenses: Not  hard at all  Food Insecurity: No Food Insecurity (11/01/2022)   Hunger Vital Sign    Worried About Running Out of Food in the Last Year: Never true    Ran Out of Food in the Last Year: Never true  Transportation Needs: No Transportation Needs (11/01/2022)   PRAPARE - Administrator, Civil Service (Medical): No    Lack of Transportation (Non-Medical): No  Physical Activity: Inactive (04/26/2022)  Exercise Vital Sign    Days of Exercise per Week: 0 days    Minutes of Exercise per Session: 0 min  Stress: Stress Concern Present (04/26/2022)   [employer] of Occupational Health - Occupational Stress Questionnaire    Feeling of Stress : To some extent  Social Connections: Socially Isolated (04/26/2022)   Social Connection and Isolation Panel [NHANES]    Frequency of Communication with Friends and Family: More than three times a week    Frequency of Social Gatherings with Friends and Family: More than three times a week    Attends Religious Services: Never    Database administrator or Organizations: No    Attends Banker Meetings: Never    Marital Status: Never married  Intimate Partner Violence: Not At Risk (11/01/2022)   Humiliation, Afraid, Rape, and Kick questionnaire    Fear of Current or Ex-Partner: No    Emotionally Abused: No    Physically Abused: No    Sexually Abused: No    Family History  Problem Relation Age of Onset   Arthritis Mother    Depression Mother    Hearing loss Mother    Hyperlipidemia Mother    Miscarriages / Stillbirths Mother    Colon cancer Mother 69       2010   Colon polyps Mother    Alcohol abuse Father    Cancer Father        PROSTATE   Depression Father    Hypertension Father    Hyperlipidemia Father    Drug abuse Brother    Early death Maternal Grandmother    Cancer Paternal Grandmother    Heart attack Neg Hx    Diabetes Neg Hx    Esophageal cancer Neg Hx    Rectal cancer Neg Hx    Stomach cancer Neg Hx      Review of Systems:  As stated in the HPI and otherwise negative.   BP 100/68   Pulse 67   Ht 5\' 4"  (1.626 m)   Wt 100.7 kg   SpO2 98%   BMI 38.11 kg/m   Physical Examination: General: Well developed, well nourished, NAD  HEENT: OP clear, mucus membranes moist  SKIN: warm, dry. No rashes. Neuro: No focal deficits  Musculoskeletal: Muscle strength 5/5 all ext  Psychiatric: Mood and affect normal  Neck: No JVD, no carotid bruits, no thyromegaly, no lymphadenopathy.  Lungs:Clear bilaterally, no wheezes, rhonci, crackles Cardiovascular: Regular rate and rhythm. Loud, harsh, late peaking systolic murmur.  Abdomen:Soft. Bowel sounds present. Non-tender.  Extremities:  No lower extremity edema. Pulses are 2 + in the bilateral DP/PT.  EKG:  EKG is not ordered today. The ekg ordered today demonstrates   Echo 03/13/23:  1. Left ventricular ejection fraction, by estimation, is 45%. The left  ventricle has mildly decreased function. The left ventricle demonstrates  global hypokinesis. The left ventricular internal cavity size was mildly  dilated. Left ventricular diastolic  parameters are consistent with Grade II diastolic dysfunction  (pseudonormalization). The average left ventricular global longitudinal  strain is -13.1 %. The global longitudinal strain is abnormal.   2. Right ventricular systolic function is normal. The right ventricular  size is normal. There is normal pulmonary artery systolic pressure. The  estimated right ventricular systolic pressure is 32.4 mmHg.   3. The mitral valve is degenerative. Trivial mitral valve regurgitation.  No evidence of mitral stenosis. Moderate mitral annular calcification.   4. The aortic valve is tricuspid. There is  severe calcifcation of the  aortic valve. Aortic valve regurgitation is trivial. Severe low flow/low  gradient aortic valve stenosis. Aortic valve area, by VTI measures 0.79  cm. Aortic valve mean gradient  measures 31.0  mmHg.   5. The inferior vena cava is normal in size with greater than 50%  respiratory variability, suggesting right atrial pressure of 3 mmHg.   FINDINGS   Left Ventricle: Left ventricular ejection fraction, by estimation, is  45%. The left ventricle has mildly decreased function. The left ventricle  demonstrates global hypokinesis. The average left ventricular global  longitudinal strain is -13.1 %. The  global longitudinal strain is abnormal. The left ventricular internal  cavity size was mildly dilated. There is no left ventricular hypertrophy.  Left ventricular diastolic parameters are consistent with Grade II  diastolic dysfunction  (pseudonormalization).   Right Ventricle: The right ventricular size is normal. No increase in  right ventricular wall thickness. Right ventricular systolic function is  normal. There is normal pulmonary artery systolic pressure. The tricuspid  regurgitant velocity is 2.71 m/s, and   with an assumed right atrial pressure of 3 mmHg, the estimated right  ventricular systolic pressure is 32.4 mmHg.   Left Atrium: Left atrial size was normal in size.   Right Atrium: Right atrial size was normal in size.   Pericardium: There is no evidence of pericardial effusion.   Mitral Valve: The mitral valve is degenerative in appearance. There is  mild calcification of the mitral valve leaflet(s). Moderate mitral annular  calcification. Trivial mitral valve regurgitation. No evidence of mitral  valve stenosis.   Tricuspid Valve: The tricuspid valve is normal in structure. Tricuspid  valve regurgitation is trivial.   Aortic Valve: The aortic valve is tricuspid. There is severe calcifcation  of the aortic valve. Aortic valve regurgitation is trivial. Severe aortic  stenosis is present. Aortic valve mean gradient measures 31.0 mmHg. Aortic  valve peak gradient measures  41.1 mmHg. Aortic valve area, by VTI measures 0.79 cm.   Pulmonic Valve: The pulmonic  valve was normal in structure. Pulmonic valve  regurgitation is not visualized.   Aorta: The aortic root is normal in size and structure.   Venous: The inferior vena cava is normal in size with greater than 50%  respiratory variability, suggesting right atrial pressure of 3 mmHg.   IAS/Shunts: No atrial level shunt detected by color flow Doppler.     LEFT VENTRICLE  PLAX 2D  LVIDd:         5.65 cm   Diastology  LVIDs:         4.37 cm   LV e' medial:    5.87 cm/s  LV PW:         0.71 cm   LV E/e' medial:  21.0  LV IVS:        0.75 cm   LV e' lateral:   8.24 cm/s  LVOT diam:     1.97 cm   LV E/e' lateral: 14.9  LV SV:         66  LV SV Index:   32        2D Longitudinal Strain  LVOT Area:     3.05 cm  2D Strain GLS Avg:     -13.1 %                             3D Volume EF:  3D EF:        42 %                           LV EDV:       213 ml                           LV ESV:       123 ml                           LV SV:        90 ml   RIGHT VENTRICLE  RV S prime:     10.40 cm/s  TAPSE (M-mode): 2.5 cm   LEFT ATRIUM             Index        RIGHT ATRIUM           Index  LA diam:        3.95 cm 1.94 cm/m   RA Area:     12.40 cm  LA Vol (A2C):   54.3 ml 26.61 ml/m  RA Volume:   27.60 ml  13.53 ml/m  LA Vol (A4C):   56.2 ml 27.54 ml/m  LA Biplane Vol: 55.3 ml 27.10 ml/m   AORTIC VALVE  AV Area (Vmax):    2.07 cm  AV Area (Vmean):   1.71 cm  AV Area (VTI):     0.79 cm  AV Vmax:           320.67 cm/s  AV Vmean:          253.000 cm/s  AV VTI:            0.834 m  AV Peak Grad:      41.1 mmHg  AV Mean Grad:      31.0 mmHg  LVOT Vmax:         218.00 cm/s  LVOT Vmean:        141.850 cm/s  LVOT VTI:          0.216 m  LVOT/AV VTI ratio: 0.26  AR Vena Contracta: 0.17 cm    AORTA  Ao Root diam: 2.96 cm  Ao Asc diam:  2.99 cm   MITRAL VALVE                TRICUSPID VALVE  MV Area (PHT): 5.13 cm     TR Peak grad:   29.4 mmHg  MV Decel Time: 148  msec     TR Vmax:        271.00 cm/s  MV E velocity: 123.00 cm/s  MV A velocity: 115.00 cm/s  SHUNTS  MV E/A ratio:  1.07         Systemic VTI:  0.22 m                              Systemic Diam: 1.97 cm   Recent Labs: 12/07/2022: TSH 0.65 02/22/2023: ALT 16; BUN 15; Creatinine 0.66; Hemoglobin 12.7; Platelet Count 297; Potassium 3.9; Sodium 140    Wt Readings from Last 3 Encounters:  03/20/23 100.7 kg  03/16/23 101.3 kg  03/09/23 100.2 kg    Assessment and Plan:   1. Severe Aortic Valve Stenosis: She has moderate to severe, stage D2 low flow/low gradient aortic valve stenosis. NYHA  class 1 symptoms. I have personally reviewed the echo images. The aortic valve is thickened and calcified but the valve leaflets seem to open reasonably well. It is not clear that she has severe AS at this time. She would be a candidate for surgical AVR or TAVR when we elect to proceed.    I have reviewed the natural history of aortic stenosis with the patient and their family members  who are present today. We have discussed the limitations of medical therapy and the poor prognosis associated with symptomatic aortic stenosis. We have reviewed potential treatment options, including palliative medical therapy, conventional surgical aortic valve replacement, and transcatheter aortic valve replacement. We discussed treatment options in the context of the patient's specific comorbid medical conditions.   I will arrange her pre-TAVR CT scans to get a better look at her valve severity. 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 If her CT scans suggest her AS is severe, we will start planning for TAVR by arranging a cardiac cath and visit with the CT surgeon on our team. If we don't proceed with TAVR yet, she will need an echo in July 2025.       Labs/ tests ordered today include:  No orders of the defined types were placed in this encounter.  Disposition:   F/U will be arranged with the structural  team  Signed, Verne Carrow, MD, [facility] 03/20/2023 10:24 AM    [facility] [address] [place], [place], [place]  [address] Phone: [phone]; Fax: [phone]

## 2023-03-20 NOTE — Progress Notes (Signed)
Pre Surgical Assessment: 5 M Walk Test  41M=16.54ft  5 Meter Walk Test- trial 1: 5.85 seconds 5 Meter Walk Test- trial 2: 5.62 seconds 5 Meter Walk Test- trial 3: 5.64 seconds 5 Meter Walk Test Average: 5.70 seconds

## 2023-04-03 ENCOUNTER — Ambulatory Visit (HOSPITAL_COMMUNITY)
Admission: RE | Admit: 2023-04-03 | Discharge: 2023-04-03 | Disposition: A | Discharge status: Home / Self Care | Payer: Medicare Other | Source: Ambulatory Visit | Attending: Cardiovascular Disease | Admitting: Cardiovascular Disease

## 2023-04-03 DIAGNOSIS — Z48812 Encounter for surgical aftercare following surgery on the circulatory system: Secondary | ICD-10-CM | POA: Diagnosis not present

## 2023-04-03 DIAGNOSIS — R918 Other nonspecific abnormal finding of lung field: Secondary | ICD-10-CM | POA: Diagnosis not present

## 2023-04-03 DIAGNOSIS — K5730 Diverticulosis of large intestine without perforation or abscess without bleeding: Secondary | ICD-10-CM | POA: Diagnosis not present

## 2023-04-03 DIAGNOSIS — I350 Nonrheumatic aortic (valve) stenosis: Secondary | ICD-10-CM | POA: Diagnosis not present

## 2023-04-03 MED ORDER — DILTIAZEM HCL 25 MG/5ML IV SOLN: 10.0000 mg | INTRAVENOUS | Status: DC | PRN

## 2023-04-03 MED ORDER — NITROGLYCERIN 0.4 MG SL SUBL: 0.8000 mg | SUBLINGUAL_TABLET | Freq: Once | SUBLINGUAL | Status: DC

## 2023-04-03 MED ORDER — NITROGLYCERIN 0.4 MG SL SUBL
SUBLINGUAL_TABLET | SUBLINGUAL | Status: AC
  Filled 2023-04-03: qty 2

## 2023-04-03 MED ORDER — METOPROLOL TARTRATE 5 MG/5ML IV SOLN: 10.0000 mg | Freq: Once | INTRAVENOUS | Status: DC | PRN

## 2023-04-03 MED ORDER — IOHEXOL 350 MG/ML SOLN
95.0000 mL | Freq: Once | INTRAVENOUS | Status: AC | PRN
  Administered 2023-04-03: 95 mL via INTRAVENOUS

## 2023-04-04 ENCOUNTER — Encounter: Payer: Medicare Other | Admitting: Gastroenterology

## 2023-04-10 ENCOUNTER — Telehealth: Payer: Self-pay

## 2023-04-10 DIAGNOSIS — I350 Nonrheumatic aortic (valve) stenosis: Secondary | ICD-10-CM

## 2023-04-10 NOTE — Telephone Encounter (Signed)
-----   Message from Verne Carrow sent at 04/03/2023  3:25 PM EST ----- Her aortic valve seems to open in the moderate severity range. I do not think her AS is severe. AV calcium score of 751 which would support moderate AS. She will need a repeat echo in 6 months. Can we let her know? Thanks, chris

## 2023-04-10 NOTE — Telephone Encounter (Signed)
 Called and spoke with patient who denies all symptoms related to A.S. repeat ECHO ordered to be done in 6 months. She's aware that she'll be called to schedule.

## 2023-04-14 NOTE — Telephone Encounter (Signed)
 Pt is calling back. She still has questions and doesn't understand

## 2023-04-14 NOTE — Telephone Encounter (Signed)
 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 Pt also has questions about surgical replacement of her valve. Pt stated she'd prefer to speak with a doctor since "they know more about this than nurses." Pt stated that if she does not answer the phone, she'd like her partner [name] (per [facility]) to be called. Pt was assured that her concerns would be forwarded to the doctor.

## 2023-04-18 NOTE — Telephone Encounter (Signed)
 Left message for pt to call back

## 2023-04-19 NOTE — Telephone Encounter (Signed)
 I spoke with the patient and reviewed her 2/17 TAVR CT results in depth.  The pt is aware that our team has recommended continued surveillance of aortic stenosis at this time. The pt is very concerned that her DOT renewal is in July and she will need echo completed prior to this evaluation and additional documentation completed so that she does not lose her job.  The patient does not like to wait until the last minute because this is her lively hood.  I have scheduled the patient for repeat echo on 5/15, which is 3 months from her last study.

## 2023-04-28 ENCOUNTER — Encounter (HOSPITAL_COMMUNITY)
Admission: RE | Admit: 2023-04-28 | Discharge: 2023-04-28 | Disposition: A | Discharge status: Home / Self Care | Payer: Medicare Other | Source: Ambulatory Visit | Attending: Emergency Medicine | Admitting: Emergency Medicine

## 2023-04-28 ENCOUNTER — Ambulatory Visit (HOSPITAL_COMMUNITY)
Admission: RE | Admit: 2023-04-28 | Discharge: 2023-04-28 | Disposition: A | Discharge status: Home / Self Care | Payer: Medicare Other | Source: Ambulatory Visit | Attending: Emergency Medicine | Admitting: Emergency Medicine

## 2023-04-28 DIAGNOSIS — R911 Solitary pulmonary nodule: Secondary | ICD-10-CM | POA: Insufficient documentation

## 2023-04-28 DIAGNOSIS — Z01818 Encounter for other preprocedural examination: Secondary | ICD-10-CM | POA: Diagnosis not present

## 2023-04-28 DIAGNOSIS — R918 Other nonspecific abnormal finding of lung field: Secondary | ICD-10-CM | POA: Diagnosis not present

## 2023-04-28 DIAGNOSIS — I700 Atherosclerosis of aorta: Secondary | ICD-10-CM | POA: Diagnosis not present

## 2023-04-28 LAB — GLUCOSE, CAPILLARY: Glucose-Capillary: 96 mg/dL (ref 70–99)

## 2023-04-28 MED ORDER — FLUDEOXYGLUCOSE F - 18 (FDG) INJECTION
10.4200 | Freq: Once | INTRAVENOUS | Status: AC
  Administered 2023-04-28: 10.42 via INTRAVENOUS

## 2023-05-04 ENCOUNTER — Ambulatory Visit

## 2023-05-24 ENCOUNTER — Encounter: Payer: Self-pay | Admitting: Emergency Medicine

## 2023-05-24 ENCOUNTER — Ambulatory Visit: Admitting: Emergency Medicine

## 2023-05-24 VITALS — BP 132/74 | HR 72 | Ht 64.0 in | Wt 216.2 lb

## 2023-05-24 DIAGNOSIS — G4733 Obstructive sleep apnea (adult) (pediatric): Secondary | ICD-10-CM | POA: Diagnosis not present

## 2023-05-24 DIAGNOSIS — R918 Other nonspecific abnormal finding of lung field: Secondary | ICD-10-CM | POA: Diagnosis not present

## 2023-05-24 NOTE — Assessment & Plan Note (Signed)
 The predominant right middle lobe pulmonary nodule is stable in size, PET scan negative for hypermetabolism.  In absence of spiculation, calculation for risk for malignancy is around 2.7%.  I have recommended that we continue to follow with serial imaging, next scan to be done in 6 months.  If there is an interval change and suspicion increases then we will pursue bronchoscopy or if we believe she is a candidate for primary resection then we can make that referral.  Given her aortic stenosis I doubt that she would be a good candidate for surgical resection.

## 2023-05-24 NOTE — Patient Instructions (Signed)
 We reviewed your CT scan of the chest and your PET scan today.  Your right middle lobe pulmonary nodule is unchanged, does not have metabolic activity.  This is reassuring news. We will plan to repeat your CT scan of the chest without contrast in September 2025 to follow for interval stability. Keep up the good work with your exercise routine and physical conditioning. Follow with Dr. Delton Coombes in September after your CT so we can review those results together.  Based on this we will decide if any procedures would be indicated to evaluate the pulmonary nodule.

## 2023-05-24 NOTE — Assessment & Plan Note (Signed)
Continue CPAP as ordered 

## 2023-05-24 NOTE — Progress Notes (Signed)
 Subjective:    Patient ID: [name], female    DOB: 07/03/1955, 68 y.o.   MRN: [id number]  HPI  ROV 05/24/23 --follow-up visit 68 year old woman, former smoker (20 pk-yrs), for an abnormal CT scan of the chest.  She is a 10 mm right middle lobe pulmonary nodule noted on CT chest 01/2023.  We decided to repeat a PET scan/CT scan to follow for interval change, assess for hypermetabolism.  Also referred her back to cardiology given her AS.  She saw Dr. Clifton James in February, is considering TAVR, under evaluation by the structural team. PMH significant for breast cancer 1996, aortic stenosis, hypertension with diastolic dysfunction, diabetes, OSA on CPAP, PAH. Today she reports that she is doing well -   Calculated risk >  3 - 6.5% for malignancy   PET scan/CT scan 04/28/2023 reviewed by me shows no mediastinal adenopathy, stable 10 mm right middle lobe pulmonary nodule, 3 mm left upper lobe nodule, tiny posterior left upper lobe nodule.  Other punctate nodules are unchanged.  There is no hypermetabolic activity in the 10 mm right middle lobe nodule.   Review of Systems As per HPI  Past Medical History:  Diagnosis Date   Anxiety    Aortic stenosis, mild    echo 2017   Arthritis    Breast cancer (HCC) 1996   left breast   Carpal tunnel syndrome    Cataract    Chronic diastolic CHF (congestive heart failure) (HCC)    NYHA class II   COVID-19    12/02/21   Diabetes mellitus without complication (HCC)    Hyperlipidemia    Hypertension    Left bundle branch block (LBBB)    Low back pain    Neuropathy    NICM (nonischemic cardiomyopathy) (HCC)    EF 30%, cath 05/20/2015 clean coronary.  EF resolved by echo with EF 55%   OSA on CPAP    Prediabetes    Pulmonary HTN (HCC) 05/2015   Severe with PASP 67/14mmHg - resolved on followup echo   PVC's (premature ventricular contractions) 11/04/2015   Sleep apnea    cpap     Family History  Problem Relation Age of Onset   Arthritis  Mother    Depression Mother    Hearing loss Mother    Hyperlipidemia Mother    Miscarriages / [place] Mother    Colon cancer Mother 56       2010   Colon polyps Mother    Alcohol abuse Father    Cancer Father        PROSTATE   Depression Father    Hypertension Father    Hyperlipidemia Father    Drug abuse Brother    Early death Maternal Grandmother    Cancer Paternal Grandmother    Heart attack Neg Hx    Diabetes Neg Hx    Esophageal cancer Neg Hx    Rectal cancer Neg Hx    Stomach cancer Neg Hx      Social History   Socioeconomic History   Marital status: Significant Other    Spouse name: Not on file   Number of children: 0   Years of education: Not on file   Highest education level: Not on file  Occupational History   Occupation: [employer] doctor   Occupation: Environmental manager  Tobacco Use   Smoking status: Former    Current packs/day: 0.00    Types: Cigarettes    Quit date: 06/18/2009  Years since quitting: 13.9   Smokeless tobacco: Never  Vaping Use   Vaping status: Never Used  Substance and Sexual Activity   Alcohol use: Yes    Comment: once every 2 months   Drug use: No   Sexual activity: Yes  Other Topics Concern   Not on file  Social History Narrative   ** Merged History Encounter **       Social Drivers of Health   Financial Resource Strain: Low Risk  (04/26/2022)   Overall Financial Resource Strain (CARDIA)    Difficulty of Paying Living Expenses: Not hard at all  Food Insecurity: No Food Insecurity (11/01/2022)   Hunger Vital Sign    Worried About Running Out of Food in the Last Year: Never true    Ran Out of Food in the Last Year: Never true  Transportation Needs: No Transportation Needs (11/01/2022)   PRAPARE - Administrator, Civil Service (Medical): No    Lack of Transportation (Non-Medical): No  Physical Activity: Inactive (04/26/2022)   Exercise Vital Sign    Days of Exercise per Week: 0 days    Minutes of Exercise per  Session: 0 min  Stress: Stress Concern Present (04/26/2022)   [employer] of Occupational Health - Occupational Stress Questionnaire    Feeling of Stress : To some extent  Social Connections: Socially Isolated (04/26/2022)   Social Connection and Isolation Panel [NHANES]    Frequency of Communication with Friends and Family: More than three times a week    Frequency of Social Gatherings with Friends and Family: More than three times a week    Attends Religious Services: Never    Database administrator or Organizations: No    Attends Banker Meetings: Never    Marital Status: Never married  Intimate Partner Violence: Not At Risk (11/01/2022)   Humiliation, Afraid, Rape, and Kick questionnaire    Fear of Current or Ex-Partner: No    Emotionally Abused: No    Physically Abused: No    Sexually Abused: No    - Former smoker (1 pack a day for 20 years) - Occupation: Art therapist  Allergies  Allergen Reactions   Jardiance [Empagliflozin] Anaphylaxis, Itching, Rash and Other (See Comments)    Yeast infection   Atorvastatin Other (See Comments)    REACTION: mylagias   Pravastatin Sodium Other (See Comments)    REACTION: myalgias   Sulfa Antibiotics Hives     Outpatient Medications Prior to Visit  Medication Sig Dispense Refill   acetaminophen (TYLENOL) 650 MG CR tablet Take 1,300 mg by mouth as needed for pain.     aspirin 81 MG tablet Take 81 mg by mouth daily.     Blood Glucose Monitoring Suppl (ONETOUCH VERIO FLEX SYSTEM) w/Device KIT Use onetouch verio flex meter to check blood sugar 3 times weekly, alternating between fasting and 2 hours after meals. 1 kit 0   carvedilol (COREG) 6.25 MG tablet TAKE 1 TABLET BY MOUTH TWICE DAILY WITH A MEAL 180 tablet 3   clotrimazole (LOTRIMIN) 1 % cream Apply 1 Application topically 2 (two) times daily. 60 g 0   Cyanocobalamin (VITAMIN B 12 PO) Take by mouth.     diclofenac Sodium (VOLTAREN) 1 % GEL Apply 4 g topically 4  (four) times daily. Arthritis 350 g 1   diphenhydrAMINE HCl (BENADRYL ALLERGY PO) Take 1 tablet by mouth as needed.     ezetimibe (ZETIA) 10 MG tablet Take 1 tablet by mouth  once daily 90 tablet 2   furosemide (LASIX) 40 MG tablet TAKE 1 TO 2 TABLETS BY MOUTH ONCE DAILY AS DIRECTED 180 tablet 2   glucose blood (ONETOUCH VERIO) test strip USE 1 STRIP TO CHECK BLOOD SUGAR TWO TO THREE TIMES A DAY 300 each 2   metFORMIN (GLUCOPHAGE-XR) 500 MG 24 hr tablet 3 tabs qd 270 tablet 3   Multiple Vitamin (MULTIVITAMIN) tablet Take 1 tablet by mouth daily.     OneTouch Delica Lancets 30G MISC Use OneTouch Delica lancets to check blood sugar 3 times weekly, alternating between fasting and 2 hours after meals. 300 each 2   PEG 3350-KCl-NaCl-NaSulf-MgSul (SUFLAVE) 178.7 g SOLR Take 1 kit by mouth as directed. 1 each 0   rosuvastatin (CRESTOR) 5 MG tablet Take 1 tablet by mouth once daily 90 tablet 0   sacubitril-valsartan (ENTRESTO) 24-26 MG Take 1 tablet by mouth 2 (two) times daily. 180 tablet 0   spironolactone (ALDACTONE) 25 MG tablet TAKE 1/2 (ONE-HALF) TABLET BY MOUTH ONCE DAILY . 45 tablet 3   vitamin C (ASCORBIC ACID) 500 MG tablet Take 2,000 mg by mouth as needed.      No facility-administered medications prior to visit.        Objective:   Physical Exam  Vitals:   05/24/23 1000  BP: 132/74  Pulse: 72  SpO2: 98%  Weight: 216 lb 3.2 oz (98.1 kg)  Height: 5\' 4"  (1.626 m)   Gen: Pleasant, overweight woman, in no distress,  normal affect  ENT: No lesions,  mouth clear,  oropharynx clear, no postnasal drip  Neck: No JVD, no stridor  Lungs: No use of accessory muscles, no crackles or wheezing on normal respiration, no wheeze on forced expiration  Cardiovascular: RRR, 2/6 systolic murmur  Musculoskeletal: No deformities, no cyanosis or clubbing  Neuro: alert, awake, non focal  Skin: Warm, no lesions or rash      Assessment & Plan:  Pulmonary nodules The predominant right middle  lobe pulmonary nodule is stable in size, PET scan negative for hypermetabolism.  In absence of spiculation, calculation for risk for malignancy is around 2.7%.  I have recommended that we continue to follow with serial imaging, next scan to be done in 6 months.  If there is an interval change and suspicion increases then we will pursue bronchoscopy or if we believe she is a candidate for primary resection then we can make that referral.  Given her aortic stenosis I doubt that she would be a good candidate for surgical resection.  OSA (obstructive sleep apnea) Continue CPAP as ordered  Time spent 36 minutes  Levy Pupa, MD, PhD 05/24/2023, 10:38 AM [facility] [phone] or if no answer before 7:00PM call [phone] For any issues after 7:00PM please call eLink [phone]

## 2023-05-25 ENCOUNTER — Ambulatory Visit

## 2023-05-30 ENCOUNTER — Ambulatory Visit (INDEPENDENT_AMBULATORY_CARE_PROVIDER_SITE_OTHER): Admitting: Physician Assistant

## 2023-05-30 ENCOUNTER — Encounter: Payer: Self-pay | Admitting: Physician Assistant

## 2023-05-30 VITALS — BP 124/78 | HR 70 | Temp 97.3°F | Ht 64.0 in | Wt 214.8 lb

## 2023-05-30 DIAGNOSIS — Z7984 Long term (current) use of oral hypoglycemic drugs: Secondary | ICD-10-CM | POA: Diagnosis not present

## 2023-05-30 DIAGNOSIS — E119 Type 2 diabetes mellitus without complications: Secondary | ICD-10-CM

## 2023-05-30 DIAGNOSIS — M170 Bilateral primary osteoarthritis of knee: Secondary | ICD-10-CM

## 2023-05-30 DIAGNOSIS — R918 Other nonspecific abnormal finding of lung field: Secondary | ICD-10-CM | POA: Diagnosis not present

## 2023-05-30 DIAGNOSIS — I350 Nonrheumatic aortic (valve) stenosis: Secondary | ICD-10-CM | POA: Diagnosis not present

## 2023-05-30 DIAGNOSIS — E538 Deficiency of other specified B group vitamins: Secondary | ICD-10-CM | POA: Diagnosis not present

## 2023-05-30 LAB — VITAMIN B12: Vitamin B-12: >1537 pg/mL — ABNORMAL HIGH (ref 211–911)

## 2023-05-30 LAB — POCT GLYCOSYLATED HEMOGLOBIN (HGB A1C): Hemoglobin A1C: 5.9 % — AB (ref 4.0–5.6)

## 2023-05-30 MED ORDER — ROSUVASTATIN CALCIUM 5 MG PO TABS: 5.0000 mg | ORAL_TABLET | Freq: Every day | ORAL | 0 refills | Status: DC

## 2023-05-30 NOTE — Progress Notes (Signed)
 Patient ID: [name], female    DOB: [birth year], 68 y.o.   MRN: [id number]   Assessment & Plan:  Type 2 diabetes mellitus without complication, without long-term current use of insulin (HCC) -     POCT glycosylated hemoglobin (Hb A1C)  B12 deficiency -     Vitamin B12  Pulmonary nodules  Severe aortic stenosis  Bilateral primary osteoarthritis of knee  Other orders -     Rosuvastatin Calcium; Take 1 tablet (5 mg total) by mouth daily.  Dispense: 90 tablet; Refill: 0      Assessment and Plan Assessment & Plan Severe Aortic Valve Calcification and Aortic Stenosis Severe aortic valve calcification and stenosis are under cardiology management. An echocardiogram is scheduled for Jun 29, 2023, to evaluate the condition further. - Perform echocardiogram on Jun 29, 2023 - Consult with cardiologist post-echocardiogram for further management  Colorectal Cancer Screening Colorectal cancer screening is pending cardiology clearance for anesthesia. A GI consultation with Dr. Adela Lank is scheduled to discuss performing the colonoscopy in a hospital setting due to anesthesia requirements.  Pulmonary Nodules Pulmonary nodules are under pulmonologist surveillance. A recent PET CT scan in March 2025 showed no metabolic activity, indicating no current malignancy. - Continue monitoring pulmonary nodules with pulmonologist  Type 2 Diabetes Mellitus Type 2 diabetes is well-controlled with metformin and lifestyle modifications. Recent A1c is 5.9, improved from 6.5, indicating excellent glycemic control. She reports weight loss and improved energy levels due to better nutrition and increased physical activity. - Continue metformin - Encourage continued lifestyle modifications including diet and exercise  Vitamin B12 Deficiency Vitamin B12 levels are being re-evaluated due to previous deficiency and associated neuropathic symptoms. Symptom improvement suggests possible improvement in B12  levels. She is taking over-the-counter supplements contributing to symptom improvement. - Check Vitamin B12 levels today  Knee Osteoarthritis Knee osteoarthritis is managed with over-the-counter supplements and topical treatments. She is considering cortisone injections and possibly viscosupplementation pending orthopedic evaluation. Current management includes glucosamine chondroitin and topical treatments like Tiger Balm. - Attend orthopedic consultation for potential cortisone injections and further management of knee osteoarthritis  General Health Maintenance She is actively engaged in health maintenance, including weight management and nutritional supplementation. Significant lifestyle changes, such as juicing and improved dietary habits, contribute to overall health improvement. - Encourage continued weight management and healthy lifestyle choices  Follow-up Follow-up appointments are scheduled to address ongoing health concerns and management plans. - Follow up in 6 months for routine evaluation - Review B12 levels once results are available      Return in about 6 months (around 11/29/2023) for recheck/follow-up.    Subjective:    Chief Complaint  Patient presents with   Diabetes    Pt in office for Diabetes follow up and to discuss current care plan and updates; POC A1C completed today; Pt wanting to discuss screening such as Colonoscopy since negative PET scan; pt has been losing weight and feels great; pt brought all meds and supplements to look over with PCP;     Diabetes   Discussed the use of AI scribe software for clinical note transcription with the patient, who gave verbal consent to proceed.  History of Present Illness [name] is a 68 year old female who presents for a regular six-month follow-up. She is here with her friend, Jamesetta So, today.   She is under the care of a pulmonologist for monitoring pulmonary nodules. A PET CT scan conducted in  mid-March 2025 showed no metabolic  activity, and the nodules are being observed for any changes.  She is being followed by cardiology for severe aortic valve calcification and aortic stenosis. She is awaiting further evaluation, including an echocardiogram scheduled for Jun 29, 2023.  She is considering a colonoscopy and has an upcoming appointment with a GI specialist to discuss the procedure. Due to her cardiac condition, the colonoscopy may need to be performed in a hospital setting under anesthesia.  She reports improvement in her neuropathic symptoms, which she attributes to taking a supplement containing B12 and other vitamins. She experiences less tingling, now primarily in her fingers.  She has been actively working on weight loss and improving her nutrition, resulting in a decrease in her A1c from 6.5 to 5.9. She attributes her success to better dietary choices, including juicing and taking supplements like cinnamon, which she believes help manage her diabetes.  She continues to take metformin for diabetes management and uses various supplements and topical treatments for joint support and pain relief, including glucosamine chondroitin, Voltaren, and Tiger Balm. She is considering cortisone shots for her knees to aid in mobility.     Past Medical History:  Diagnosis Date   Anxiety    Aortic stenosis, mild    echo 2017   Arthritis    Breast cancer (HCC) 1996   left breast   Carpal tunnel syndrome    Cataract    Chronic diastolic CHF (congestive heart failure) (HCC)    NYHA class II   COVID-19    12/02/21   Diabetes mellitus without complication (HCC)    Hyperlipidemia    Hypertension    Left bundle branch block (LBBB)    Low back pain    Neuropathy    NICM (nonischemic cardiomyopathy) (HCC)    EF 30%, cath 05/20/2015 clean coronary.  EF resolved by echo with EF 55%   OSA on CPAP    Prediabetes    Pulmonary HTN (HCC) 05/2015   Severe with PASP 67/72mmHg - resolved on  followup echo   PVC's (premature ventricular contractions) 11/04/2015   Sleep apnea    cpap    Past Surgical History:  Procedure Laterality Date   BREAST SURGERY     CARDIAC CATHETERIZATION N/A 05/20/2015   Procedure: Right/Left Heart Cath and Coronary Angiography;  Surgeon: Lennette Bihari, MD;  Location: [facility];  Service: Cardiovascular;  Laterality: N/A;   CATARACT EXTRACTION W/ INTRAOCULAR LENS  IMPLANT, BILATERAL     COLONOSCOPY  2018   DILATION AND CURETTAGE OF UTERUS     lumpectomy left breast     POLYPECTOMY     UTERINE FIBROID SURGERY      Family History  Problem Relation Age of Onset   Arthritis Mother    Depression Mother    Hearing loss Mother    Hyperlipidemia Mother    Miscarriages / [place] Mother    Colon cancer Mother 19       2010   Colon polyps Mother    Alcohol abuse Father    Cancer Father        PROSTATE   Depression Father    Hypertension Father    Hyperlipidemia Father    Drug abuse Brother    Early death Maternal Grandmother    Cancer Paternal Grandmother    Heart attack Neg Hx    Diabetes Neg Hx    Esophageal cancer Neg Hx    Rectal cancer Neg Hx    Stomach cancer Neg Hx  Social History   Tobacco Use   Smoking status: Former    Current packs/day: 0.00    Types: Cigarettes    Quit date: 06/18/2009    Years since quitting: 13.9   Smokeless tobacco: Never  Vaping Use   Vaping status: Never Used  Substance Use Topics   Alcohol use: Yes    Comment: once every 2 months   Drug use: No     Allergies  Allergen Reactions   Jardiance [Empagliflozin] Anaphylaxis, Itching, Rash and Other (See Comments)    Yeast infection   Atorvastatin Other (See Comments)    REACTION: mylagias   Other Other (See Comments)    Product containing 3-hydroxy-3-methylglutaryl-coenzyme A reductase inhibitor (product)   Pravastatin Sodium Other (See Comments)    REACTION: myalgias   Sulfa Antibiotics Hives    Review of Systems NEGATIVE  UNLESS OTHERWISE INDICATED IN HPI      Objective:     BP 124/78 (BP Location: Left Arm, Patient Position: Sitting, Cuff Size: Large)   Pulse 70   Temp (!) 97.3 F (36.3 C) (Temporal)   Ht 5\' 4"  (1.626 m)   Wt 214 lb 12.8 oz (97.4 kg)   SpO2 98%   BMI 36.87 kg/m   Wt Readings from Last 3 Encounters:  05/30/23 214 lb 12.8 oz (97.4 kg)  05/24/23 216 lb 3.2 oz (98.1 kg)  03/20/23 222 lb (100.7 kg)    BP Readings from Last 3 Encounters:  05/30/23 124/78  05/24/23 132/74  04/03/23 126/75     Physical Exam Vitals reviewed.  Constitutional:      Appearance: Normal appearance. She is well-developed. She is obese.  HENT:     Head: Normocephalic and atraumatic.  Eyes:     Conjunctiva/sclera: Conjunctivae normal.     Pupils: Pupils are equal, round, and reactive to light.  Neck:     Thyroid: No thyromegaly.     Vascular: No carotid bruit.  Cardiovascular:     Rate and Rhythm: Normal rate and regular rhythm.     Heart sounds: Murmur (previously known) heard.  Pulmonary:     Effort: Pulmonary effort is normal.     Breath sounds: Normal breath sounds.  Musculoskeletal:     Cervical back: Normal range of motion and neck supple.     Right lower leg: Edema present.     Left lower leg: Edema present.     Comments: Non-pitting edema BLE  Lymphadenopathy:     Cervical: No cervical adenopathy.  Skin:    General: Skin is warm and dry.     Capillary Refill: Capillary refill takes less than 2 seconds.     Findings: No rash.  Neurological:     General: No focal deficit present.     Mental Status: She is alert and oriented to person, place, and time.     Cranial Nerves: No cranial nerve deficit.     Coordination: Coordination normal.     Deep Tendon Reflexes: Reflexes normal.  Psychiatric:        Behavior: Behavior normal.             Geni Skorupski M Keisi Eckford, PA-C

## 2023-05-30 NOTE — Patient Instructions (Signed)
 Great to see you today!   Keep up the wonderful work! Great health choices you're making!  Lab Results  Component Value Date   HGBA1C 5.9 (A) 05/30/2023   HGBA1C 6.5 12/07/2022   HGBA1C 5.8 (A) 05/20/2022

## 2023-06-06 ENCOUNTER — Ambulatory Visit: Payer: Medicare Other | Admitting: Physician Assistant

## 2023-06-13 ENCOUNTER — Encounter: Payer: Self-pay | Admitting: Gastroenterology

## 2023-06-13 ENCOUNTER — Ambulatory Visit: Payer: Medicare Other | Admitting: Gastroenterology

## 2023-06-13 VITALS — BP 110/70 | HR 65 | Ht 64.0 in | Wt 218.0 lb

## 2023-06-13 DIAGNOSIS — Z800 Family history of malignant neoplasm of digestive organs: Secondary | ICD-10-CM | POA: Diagnosis not present

## 2023-06-13 DIAGNOSIS — I350 Nonrheumatic aortic (valve) stenosis: Secondary | ICD-10-CM

## 2023-06-13 DIAGNOSIS — Z860100 Personal history of colon polyps, unspecified: Secondary | ICD-10-CM | POA: Diagnosis not present

## 2023-06-13 NOTE — Progress Notes (Signed)
 HPI :  68 year old female known to me for history of colon polyps, family history of colon cancer, here to reestablish her care for these issues.  She was last seen in 2021.  She also has a history of suspected severe aortic stenosis, followed by cardiology, history of breast cancer in remission.  She had a colonoscopy with me in 2018, at that point in time she had 9 polyps removed, all adenomas and sessile serrated polyps.  Her last colonoscopy was subsequently in September 2021, 11 additional polyps removed, most adenomatous/sessile serrated.  Her mother had colon cancer age 71s.  She is not sure if any of her siblings have had colon polyps removed in the past, no other family history of colon cancer she is aware of.  She has not had any problems with her bowels since have last seen her.  She moves her bowels well without any problems.  She denies any blood in her stools.  No routine abdominal pain bother her.  Her last 2 colonoscopy exams were done in our office.  Since I have last seen her, however she has developed abnormal echocardiogram with suspicion for severe aortic stenosis.  She has been followed by cardiology closely.  She denies any dyspnea on exertion or shortness of breath.  She has had echocardiogram, CTA, per cardiology she may not have severe aortic stenosis, they monitoring for now she has a follow-up echo in the next few months to reassess this.  There are currently no plans for intervening on the aortic stenosis but that may be needed pending her course.  She states she has been trying to exercise and lose weight.  Recall she does have a history of breast cancer, she has a lung nodule, follow with CT and PET scans in so far stability and changes, no evidence of active malignancy.   Prior workup: Colonoscopy 05/16/2016: - Four 3 to 6 mm polyps in the ascending colon, removed with a cold snare. Resected and retrieved. - One 8 mm polyp at the hepatic flexure, removed with a cold  snare. Resected and retrieved. - One 7 mm polyp in the transverse colon, removed with a cold snare. Resected and retrieved. - One 6 mm polyp in the descending colon, removed with a cold snare. Resected and retrieved. - One 5 mm polyp in the sigmoid colon, removed with a cold snare. Resected and retrieved. - One 4 mm polyp in the rectum, removed with a cold snare. Resected and retrieved. - Diverticulosis in the left colon. - Internal hemorrhoids. - The examined portion of the ileum was normal. - The examination was otherwise normal.  Surgical [P], ascending, hepatic flexure, transverse, descending, sigmoid, rectal, polyp (9) - SESSILE SERRATED ADENOMA(S) (MULTIPLE FRAGMENTS). - TUBULAR ADENOMA(S) (MULTIPLE FRAGMENTS). - HIGH GRADE DYSPLASIA IS NOT IDENTIFIED.   Colonoscopy 10/16/2019: - Two 3 to 4 mm polyps in the cecum, removed with a cold snare. Resected and retrieved. - One 3 mm polyp in the ascending colon, removed with a cold snare. Resected and retrieved. - One 4 mm polyp at the hepatic flexure, removed with a cold snare. Resected and retrieved. - Four 3 to 8 mm polyps in the transverse colon, removed with a cold snare. Resected and retrieved. - Two 4 mm polyps in the descending colon, removed with a cold snare. Resected and retrieved. - One 3 mm polyp in the rectum, removed with a cold snare. Resected and retrieved. - Diverticulosis in the left colon. - Internal hemorrhoids. - The examination  was otherwise normal.  Surgical [P], colon, rectal, descending, transverse, hepatic flexure, ascending and cecum, polyp (11) - TUBULAR ADENOMA(S) WITHOUT HIGH-GRADE DYSPLASIA OR MALIGNANCY - SESSILE SERRATED POLYP(S) WITHOUT CYTOLOGIC DYSPLASIA - HYPERPLASTIC POLYP(S)   Echo 03/13/23: IMPRESSIONS     1. Left ventricular ejection fraction, by estimation, is 45%. The left  ventricle has mildly decreased function. The left ventricle demonstrates  global hypokinesis. The left ventricular internal cavity  size was mildly  dilated. Left ventricular diastolic  parameters are consistent with Grade II diastolic dysfunction  (pseudonormalization). The average left ventricular global longitudinal  strain is -13.1 %. The global longitudinal strain is abnormal.   2. Right ventricular systolic function is normal. The right ventricular  size is normal. There is normal pulmonary artery systolic pressure. The  estimated right ventricular systolic pressure is 32.4 mmHg.   3. The mitral valve is degenerative. Trivial mitral valve regurgitation.  No evidence of mitral stenosis. Moderate mitral annular calcification.   4. The aortic valve is tricuspid. There is severe calcifcation of the  aortic valve. Aortic valve regurgitation is trivial. Severe low flow/low  gradient aortic valve stenosis. Aortic valve area, by VTI measures 0.79  cm. Aortic valve mean gradient  measures 31.0 mmHg.   5. The inferior vena cava is normal in size with greater than 50%  respiratory variability, suggesting right atrial pressure of 3 mmHg.    CTA 03/2023: IMPRESSION: 1. No acute abnormality within the chest, abdomen or pelvis. 2. Thickened and calcified aortic valve consistent with the clinical history of aortic valvular disease. 3. No evidence of access vessel abnormality or aortic abnormality that would prevent or complicate TAVR. 4. Scattered aortic and coronary artery atherosclerotic vascular plaque. 5. No interval change in the size or appearance of the 1 cm right middle lobe pulmonary nodule. Agree with prior recommendations of pulmonary neoplasm workup with PET-CT as suggested on the CT scan of the chest dated 01/16/2023. 6. Mild cardiomegaly with left ventricular dilatation. 7. Additional ancillary findings as above.   PET scan 04/28/23: IMPRESSION: No areas of abnormal radiotracer uptake.   The right-sided 1 cm middle lobe lung nodule is stable in size compared to the available priors. As this does not  have any abnormal uptake please correlate with any more remote studies for comparison of size otherwise simple follow-up CT scan is recommended for continued surveillance in 3-6 months.   Opacification of paranasal sinuses particularly left maxillary sinus. Please correlate with any symptoms or history.      Past Medical History:  Diagnosis Date   Anxiety    Aortic stenosis, mild    echo 2017   Arthritis    Breast cancer (HCC) 1996   left breast   Carpal tunnel syndrome    Cataract    Chronic diastolic CHF (congestive heart failure) (HCC)    NYHA class II   COVID-19    12/02/21   Diabetes mellitus without complication (HCC)    Hyperlipidemia    Hypertension    Left bundle branch block (LBBB)    Low back pain    Neuropathy    NICM (nonischemic cardiomyopathy) (HCC)    EF 30%, cath 05/20/2015 clean coronary.  EF resolved by echo with EF 55%   OSA on CPAP    Prediabetes    Pulmonary HTN (HCC) 05/2015   Severe with PASP 67/67mmHg - resolved on followup echo   PVC's (premature ventricular contractions) 11/04/2015   Sleep apnea    cpap  Past Surgical History:  Procedure Laterality Date   BREAST SURGERY     CARDIAC CATHETERIZATION N/A 05/20/2015   Procedure: Right/Left Heart Cath and Coronary Angiography;  Surgeon: Millicent Ally, MD;  Location: [facility];  Service: Cardiovascular;  Laterality: N/A;   CATARACT EXTRACTION W/ INTRAOCULAR LENS  IMPLANT, BILATERAL     COLONOSCOPY  2018   DILATION AND CURETTAGE OF UTERUS     lumpectomy left breast     POLYPECTOMY     UTERINE FIBROID SURGERY     Family History  Problem Relation Age of Onset   Arthritis Mother    Depression Mother    Hearing loss Mother    Hyperlipidemia Mother    Miscarriages / Stillbirths Mother    Colon cancer Mother 53       2010   Colon polyps Mother    Alcohol abuse Father    Cancer Father        PROSTATE   Depression Father    Hypertension Father    Hyperlipidemia Father    Drug  abuse Brother    Early death Maternal Grandmother    Cancer Paternal Grandmother    Heart attack Neg Hx    Diabetes Neg Hx    Esophageal cancer Neg Hx    Rectal cancer Neg Hx    Stomach cancer Neg Hx    Social History   Tobacco Use   Smoking status: Former    Current packs/day: 0.00    Types: Cigarettes    Quit date: 06/18/2009    Years since quitting: 13.9   Smokeless tobacco: Never  Vaping Use   Vaping status: Never Used  Substance Use Topics   Alcohol use: Yes    Comment: once every 2 months   Drug use: No   Current Outpatient Medications  Medication Sig Dispense Refill   acetaminophen  (TYLENOL ) 650 MG CR tablet Take 1,300 mg by mouth as needed for pain.     aspirin  81 MG tablet Take 81 mg by mouth daily.     Blood Glucose Monitoring Suppl (ONETOUCH VERIO FLEX SYSTEM) w/Device KIT Use onetouch verio flex meter to check blood sugar 3 times weekly, alternating between fasting and 2 hours after meals. 1 kit 0   carvedilol  (COREG ) 6.25 MG tablet TAKE 1 TABLET BY MOUTH TWICE DAILY WITH A MEAL 180 tablet 3   clotrimazole  (LOTRIMIN ) 1 % cream Apply 1 Application topically 2 (two) times daily. 60 g 0   Cyanocobalamin  (VITAMIN B 12 PO) Take by mouth.     diclofenac  Sodium (VOLTAREN ) 1 % GEL Apply 4 g topically 4 (four) times daily. Arthritis 350 g 1   diphenhydrAMINE HCl (BENADRYL ALLERGY PO) Take 1 tablet by mouth as needed.     ezetimibe  (ZETIA ) 10 MG tablet Take 1 tablet by mouth once daily 90 tablet 2   furosemide  (LASIX ) 40 MG tablet TAKE 1 TO 2 TABLETS BY MOUTH ONCE DAILY AS DIRECTED 180 tablet 2   glucose blood (ONETOUCH VERIO) test strip USE 1 STRIP TO CHECK BLOOD SUGAR TWO TO THREE TIMES A DAY 300 each 2   metFORMIN  (GLUCOPHAGE -XR) 500 MG 24 hr tablet 3 tabs qd 270 tablet 3   Multiple Vitamin (MULTIVITAMIN) tablet Take 1 tablet by mouth daily.     OneTouch Delica Lancets 30G MISC Use OneTouch Delica lancets to check blood sugar 3 times weekly, alternating between fasting and  2 hours after meals. 300 each 2   PEG 3350 -KCl-NaCl-NaSulf-MgSul (SUFLAVE ) 178.7 g  SOLR Take 1 kit by mouth as directed. 1 each 0   rosuvastatin  (CRESTOR ) 5 MG tablet Take 1 tablet (5 mg total) by mouth daily. 90 tablet 0   sacubitril -valsartan  (ENTRESTO ) 24-26 MG Take 1 tablet by mouth 2 (two) times daily. 180 tablet 0   spironolactone  (ALDACTONE ) 25 MG tablet TAKE 1/2 (ONE-HALF) TABLET BY MOUTH ONCE DAILY . 45 tablet 3   vitamin C (ASCORBIC ACID) 500 MG tablet Take 2,000 mg by mouth as needed.      No current facility-administered medications for this visit.   Allergies  Allergen Reactions   Jardiance  [Empagliflozin ] Anaphylaxis, Itching, Rash and Other (See Comments)    Yeast infection   Atorvastatin Other (See Comments)    REACTION: mylagias   Other Other (See Comments)    Product containing 3-hydroxy-3-methylglutaryl-coenzyme A reductase inhibitor (product)   Pravastatin Sodium Other (See Comments)    REACTION: myalgias   Sulfa Antibiotics Hives     Review of Systems: All systems reviewed and negative except where noted in HPI.   Lab Results  Component Value Date   WBC 7.2 02/22/2023   HGB 12.7 02/22/2023   HCT 37.9 02/22/2023   MCV 91.5 02/22/2023   PLT 297 02/22/2023    Lab Results  Component Value Date   NA 140 02/22/2023   CL 108 02/22/2023   K 3.9 02/22/2023   CO2 26 02/22/2023   BUN 15 02/22/2023   CREATININE 0.66 02/22/2023   GFRNONAA >60 02/22/2023   CALCIUM  9.6 02/22/2023   PHOS 2.9 11/07/2016   ALBUMIN 4.1 02/22/2023   GLUCOSE 101 (H) 02/22/2023    Lab Results  Component Value Date   ALT 16 02/22/2023   AST 13 (L) 02/22/2023   ALKPHOS 51 02/22/2023   BILITOT 0.3 02/22/2023     Physical Exam: BP 110/70   Pulse 65   Ht 5\' 4"  (1.626 m)   Wt 218 lb (98.9 kg)   BMI 37.42 kg/m  Constitutional: Pleasant,well-developed, female in no acute distress. HEENT: Normocephalic and atraumatic. Conjunctivae are normal. No scleral icterus. Neck supple.   Cardiovascular: Normal rate, regular rhythm. SEM 2/6 SEM Pulmonary/chest: Effort normal and breath sounds normal.  Abdominal: Soft, nondistended, nontender. There are no masses palpable. Extremities: no edema Lymphadenopathy: No cervical adenopathy noted. Neurological: Alert and oriented to person place and time. Skin: Skin is warm and dry. No rashes noted. Psychiatric: Normal mood and affect. Behavior is normal.   ASSESSMENT: 68 y.o. female here for assessment of the following  1. History of colon polyps   2. Aortic valve stenosis, etiology of cardiac valve disease unspecified   3. Family history of colon cancer in mother    Numerous colon polyps on her last 2 exams.  She is overdue for surveillance colonoscopy.  She is asymptomatic from a GI tract perspective at this time.  We discussed colonoscopy, risks and benefits of the procedure and anesthesia and she understands and wants to proceed.  While we performed her exam in the office the last 2 times she had this, she has since developed suspected severe aortic stenosis on echocardiogram, ongoing workup with cardiology.  They are monitoring for now without plans for definitive treatment but she may need this over time.  Given the findings on echocardiogram, recommend that her colonoscopy be done in the hospital setting due to anesthesia risks with severe aortic stenosis.  Discussed this with the patient at length this morning and she understands and agrees with this plan.  We will  plan on doing this in the upcoming months.  If she develops any symptoms that bother her in regards to her cardiopulmonary status she needs to let me know.  Otherwise, reviewed her family history of colon cancer and that she has almost had 20 polyps that are precancerous on her last 2 exams.  With this history and her history of breast cancer, I offered her referral to genetic counselor for genetic testing if she wants to proceed with that.  We discussed what this is  and she strongly wants to see a geneticist for further testing, results of this may not only influence her but any children family members that she has.   PLAN: - referral to genetics as outlined above - colonoscopy to be scheduled at [facility] due to AS - she will follow-up with cardiology for her aortic stenosis.  If she has any symptoms from this in the interim she needs to contact me for colonoscopy.  Christi Coward, MD [facility]  CC: [name], Deleta Felix, PA-C

## 2023-06-13 NOTE — Patient Instructions (Signed)
 You have been scheduled for a colonoscopy at [facility] with Dr. General Kenner on 08-24-23. Please follow written instructions given to you at your visit today.   If you use inhalers (even only as needed), please bring them with you on the day of your procedure.  DO NOT TAKE 7 DAYS PRIOR TO TEST- Trulicity (dulaglutide) Ozempic, Wegovy (semaglutide) Mounjaro (tirzepatide) Bydureon Bcise (exanatide extended release)  DO NOT TAKE 1 DAY PRIOR TO YOUR TEST Rybelsus (semaglutide) Adlyxin (lixisenatide) Victoza (liraglutide) Byetta (exanatide) ___________________________________________________________________________  We are referring you to Genetics.  They will contact you directly to schedule an appointment.  It may take a week or more before you hear from them.  Please feel free to contact us  if you have not heard from them within 2 weeks and we will follow up on the referral.   Thank you for entrusting me with your care and for choosing [facility], Dr. Alvester Johnson     If your blood pressure at your visit was 140/90 or greater, please contact your primary care physician to follow up on this. ______________________________________________________  If you are age 38 or older, your body mass index should be between 23-30. Your Body mass index is 37.42 kg/m. If this is out of the aforementioned range listed, please consider follow up with your Primary Care Provider.  If you are age 74 or younger, your body mass index should be between 19-25. Your Body mass index is 37.42 kg/m. If this is out of the aformentioned range listed, please consider follow up with your Primary Care Provider.  ________________________________________________________  The [facility] providers would like to encourage you to use MYCHART to communicate with providers for non-urgent requests or questions.  Due to long hold times on the telephone, sending your provider a message by [facility] may be a faster  and more efficient way to get a response.  Please allow 48 business hours for a response.  Please remember that this is for non-urgent requests.  _______________________________________________________  Due to recent changes in healthcare laws, you may see the results of your imaging and laboratory studies on MyChart before your provider has had a chance to review them.  We understand that in some cases there may be results that are confusing or concerning to you. Not all laboratory results come back in the same time frame and the provider may be waiting for multiple results in order to interpret others.  Please give us  48 hours in order for your provider to thoroughly review all the results before contacting the office for clarification of your results.

## 2023-06-14 ENCOUNTER — Ambulatory Visit: Payer: Medicare Other | Admitting: Gastroenterology

## 2023-06-16 ENCOUNTER — Other Ambulatory Visit (INDEPENDENT_AMBULATORY_CARE_PROVIDER_SITE_OTHER)

## 2023-06-16 ENCOUNTER — Encounter: Payer: Self-pay | Admitting: Surgical

## 2023-06-16 ENCOUNTER — Ambulatory Visit (INDEPENDENT_AMBULATORY_CARE_PROVIDER_SITE_OTHER): Admitting: Surgical

## 2023-06-16 VITALS — Ht 63.0 in | Wt 215.2 lb

## 2023-06-16 DIAGNOSIS — G8929 Other chronic pain: Secondary | ICD-10-CM | POA: Diagnosis not present

## 2023-06-16 DIAGNOSIS — M170 Bilateral primary osteoarthritis of knee: Secondary | ICD-10-CM | POA: Diagnosis not present

## 2023-06-16 DIAGNOSIS — M25562 Pain in left knee: Secondary | ICD-10-CM

## 2023-06-16 DIAGNOSIS — M25561 Pain in right knee: Secondary | ICD-10-CM | POA: Diagnosis not present

## 2023-06-18 ENCOUNTER — Encounter: Payer: Self-pay | Admitting: Surgical

## 2023-06-18 MED ORDER — TRIAMCINOLONE ACETONIDE 40 MG/ML IJ SUSP
40.0000 mg | INTRAMUSCULAR | Status: AC | PRN
  Administered 2023-06-16: 40 mg via INTRA_ARTICULAR

## 2023-06-18 MED ORDER — LIDOCAINE HCL 1 % IJ SOLN
5.0000 mL | INTRAMUSCULAR | Status: AC | PRN
  Administered 2023-06-16: 5 mL

## 2023-06-18 MED ORDER — BUPIVACAINE HCL 0.25 % IJ SOLN
4.0000 mL | INTRAMUSCULAR | Status: AC | PRN
Start: 2023-06-16 — End: 2023-06-16
  Administered 2023-06-16: 4 mL via INTRA_ARTICULAR

## 2023-06-18 NOTE — Progress Notes (Signed)
 Office Visit Note   Patient: [name]           Date of Birth: Dec 24, 1955           MRN: [id number] Visit Date: 06/16/2023 Requested by: [name], Deleta Felix, PA-C [address] [place],  [place] [address] PCP: Alda Amas, PA-C  Subjective: Chief Complaint  Patient presents with   Right Knee - Pain   Left Knee - Pain    HPI: [name] is a 68 y.o. female who presents to the office reporting knee pain.  Has history of knee arthritis.  No new falls or injuries.  No fevers or chills.  Previous injections with Dr. Nitka and Elspeth Hals, PA-C have provided good relief and they are here today to repeat injections.  She describes diffuse pain in both knees without any radiation.  No groin pain.  She has history of diabetes but is overall well-controlled with just metformin .  She is trying to lose weight and states she is doing more walking.  Cortisone injections usually last for several months for her..                ROS: All systems reviewed are negative as they relate to the chief complaint within the history of present illness.  Patient denies fevers or chills.  Assessment & Plan: Visit Diagnoses:  1. Bilateral chronic knee pain     Plan: Patient is a 68 year old female who presents for evaluation of bilateral knee pain.  She has history of bilateral knee osteoarthritis again demonstrated on today's radiographs.  We discussed options available to patient and with her historically excellent relief from cortisone injections, would recommend continuing with these as long as these are still providing good relief for her.  Both injections administered and patient tolerated procedure well without complication.  Recommended she reach out when they wear off and we can repeat them with next possible injection in about 4 months.  Follow-Up Instructions: No follow-ups on file.   Orders:  Orders Placed This Encounter  Procedures   XR KNEE 3 VIEW LEFT   XR KNEE 3 VIEW RIGHT    No orders of the defined types were placed in this encounter.     Procedures: Large Joint Inj: bilateral knee on 06/16/2023 12:24 PM Indications: diagnostic evaluation, joint swelling and pain Details: 18 G 1.5 in needle, superolateral approach  Arthrogram: No  Medications (Right): 5 mL lidocaine  1 %; 4 mL bupivacaine  0.25 %; 40 mg triamcinolone  acetonide 40 MG/ML Medications (Left): 5 mL lidocaine  1 %; 4 mL bupivacaine  0.25 %; 40 mg triamcinolone  acetonide 40 MG/ML Outcome: tolerated well, no immediate complications Procedure, treatment alternatives, risks and benefits explained, specific risks discussed. Consent was given by the patient. Immediately prior to procedure a time out was called to verify the correct patient, procedure, equipment, support staff and site/side marked as required. Patient was prepped and draped in the usual sterile fashion.       Clinical Data: No additional findings.  Objective: Vital Signs: Ht 5\' 3"  (1.6 m)   Wt 215 lb 3.2 oz (97.6 kg)   BMI 38.12 kg/m   Physical Exam:  Constitutional: Patient appears well-developed HEENT:  Head: Normocephalic Eyes:EOM are normal Neck: Normal range of motion Cardiovascular: Normal rate Pulmonary/chest: Effort normal Neurologic: Patient is alert Skin: Skin is warm Psychiatric: Patient has normal mood and affect  Ortho Exam: Ortho exam demonstrates knees without cellulitis or skin changes.  Effusion not present in either  knee.  No calf tenderness.  Negative Homans' sign.  No pain with hip range of motion.  Able to perform straight leg raise with both lower extremities.  Lower extremities warm and well-perfused.  Moderate medial joint line tenderness in the right knee with mild medial joint line tenderness in the left knee.  No lateral joint line tenderness in either knee.  Right knee with 10 degrees extension and 90 degrees of knee flexion.  Left knee with 5 degrees extension and 90 degrees of knee  flexion.  Specialty Comments:  No specialty comments available.  Imaging: No results found.   PMFS History: Patient Active Problem List   Diagnosis Date Noted   B12 deficiency 05/30/2023   Pulmonary nodules 03/16/2023   Allergic rhinitis 03/16/2023   Arthritis 12/02/2022   PNA (pneumonia) 11/01/2022   Acute respiratory failure with hypoxia (HCC) 11/01/2022   Diabetes mellitus (HCC) 02/23/2021   Heart disease 02/23/2021   Hypertensive disorder 02/23/2021   Malignant tumor of breast (HCC) 02/23/2021   Family history of colon cancer in mother 04/15/2020   Shoulder pain, bilateral 12/11/2019   Type 2 diabetes mellitus without complications (HCC) 11/07/2016   PVC's (premature ventricular contractions) 11/04/2015   Aortic stenosis, mild    Heart palpitations 07/28/2015   Morbid obesity (HCC) 05/23/2015   Hypertensive heart disease    Nonischemic cardiomyopathy (HCC)    NSVT (nonsustained ventricular tachycardia) (HCC) 05/21/2015   Chronic combined systolic and diastolic CHF (congestive heart failure) (HCC) 05/20/2015   OSA (obstructive sleep apnea) 01/02/2014   LBBB (left bundle branch block) 01/02/2014   Hypercholesterolemia 04/15/2010   Personal history of malignant neoplasm of breast 04/15/2010   Past Medical History:  Diagnosis Date   Anxiety    Aortic stenosis, mild    echo 2017   Arthritis    Breast cancer (HCC) 1996   left breast   Carpal tunnel syndrome    Cataract    Chronic diastolic CHF (congestive heart failure) (HCC)    NYHA class II   COVID-19    12/02/21   Diabetes mellitus without complication (HCC)    Hyperlipidemia    Hypertension    Left bundle branch block (LBBB)    Low back pain    Neuropathy    NICM (nonischemic cardiomyopathy) (HCC)    EF 30%, cath 05/20/2015 clean coronary.  EF resolved by echo with EF 55%   OSA on CPAP    Prediabetes    Pulmonary HTN (HCC) 05/2015   Severe with PASP 67/47mmHg - resolved on followup echo   PVC's  (premature ventricular contractions) 11/04/2015   Sleep apnea    cpap    Family History  Problem Relation Age of Onset   Arthritis Mother    Depression Mother    Hearing loss Mother    Hyperlipidemia Mother    Miscarriages / [place] Mother    Colon cancer Mother 30       2010   Colon polyps Mother    Alcohol abuse Father    Cancer Father        PROSTATE   Depression Father    Hypertension Father    Hyperlipidemia Father    Drug abuse Brother    Early death Maternal Grandmother    Cancer Paternal Grandmother    Heart attack Neg Hx    Diabetes Neg Hx    Esophageal cancer Neg Hx    Rectal cancer Neg Hx    Stomach cancer Neg Hx  Past Surgical History:  Procedure Laterality Date   BREAST SURGERY     CARDIAC CATHETERIZATION N/A 05/20/2015   Procedure: Right/Left Heart Cath and Coronary Angiography;  Surgeon: Millicent Ally, MD;  Location: [facility];  Service: Cardiovascular;  Laterality: N/A;   CATARACT EXTRACTION W/ INTRAOCULAR LENS  IMPLANT, BILATERAL     COLONOSCOPY  2018   DILATION AND CURETTAGE OF UTERUS     lumpectomy left breast     POLYPECTOMY     UTERINE FIBROID SURGERY     Social History   Occupational History   Occupation: [employer] doctor   Occupation: Environmental manager  Tobacco Use   Smoking status: Former    Current packs/day: 0.00    Types: Cigarettes    Quit date: 06/18/2009    Years since quitting: 14.0   Smokeless tobacco: Never  Vaping Use   Vaping status: Never Used  Substance and Sexual Activity   Alcohol use: Yes    Comment: once every 2 months   Drug use: No   Sexual activity: Yes

## 2023-06-21 ENCOUNTER — Encounter (HOSPITAL_COMMUNITY): Payer: Self-pay

## 2023-06-23 ENCOUNTER — Other Ambulatory Visit (HOSPITAL_COMMUNITY): Payer: Self-pay

## 2023-06-28 ENCOUNTER — Ambulatory Visit (INDEPENDENT_AMBULATORY_CARE_PROVIDER_SITE_OTHER)

## 2023-06-28 VITALS — Ht 64.0 in | Wt 215.0 lb

## 2023-06-28 DIAGNOSIS — Z0000 Encounter for general adult medical examination without abnormal findings: Secondary | ICD-10-CM | POA: Diagnosis not present

## 2023-06-28 NOTE — Progress Notes (Signed)
 Subjective:   [name] is a 68 y.o. who presents for a Medicare Wellness preventive visit.  As a reminder, Annual Wellness Visits don't include a physical exam, and some assessments may be limited, especially if this visit is performed virtually. We may recommend an in-person visit if needed.  Visit Complete: Virtual I connected with  [name] on 06/28/23 by a audio enabled telemedicine application and verified that I am speaking with the correct person using two identifiers.  Patient Location: Home  Provider Location: Office/Clinic  I discussed the limitations of evaluation and management by telemedicine. The patient expressed understanding and agreed to proceed.  Vital Signs: Because this visit was a virtual/telehealth visit, some criteria may be missing or patient reported. Any vitals not documented were not able to be obtained and vitals that have been documented are patient reported.  VideoDeclined- This patient declined Librarian, academic. Therefore the visit was completed with audio only.  Persons Participating in Visit: Patient.  AWV Questionnaire: No: Patient Medicare AWV questionnaire was not completed prior to this visit.  Cardiac Risk Factors include: advanced age (>43men, >44 women);diabetes mellitus;hypertension;dyslipidemia;obesity (BMI >30kg/m2)     Objective:     Today's Vitals   06/28/23 0837  Weight: 215 lb (97.5 kg)  Height: 5\' 4"  (1.626 m)   Body mass index is 36.9 kg/m.     06/28/2023    8:46 AM 11/01/2022    4:50 PM 11/01/2022    9:25 AM 04/26/2022    9:13 AM 01/29/2022   12:50 PM 11/23/2019    5:04 PM 11/15/2019   10:54 AM  Advanced Directives  Does Patient Have a Medical Advance Directive? No No No No No No No  Would patient like information on creating a medical advance directive? No - Patient declined No - Guardian declined  No - Patient declined   Yes (MAU/Ambulatory/Procedural Areas - Information given)     Current Medications (verified) Outpatient Encounter Medications as of 06/28/2023  Medication Sig   acetaminophen  (TYLENOL ) 650 MG CR tablet Take 1,300 mg by mouth as needed for pain.   aspirin  81 MG tablet Take 81 mg by mouth daily.   Blood Glucose Monitoring Suppl (ONETOUCH VERIO FLEX SYSTEM) w/Device KIT Use onetouch verio flex meter to check blood sugar 3 times weekly, alternating between fasting and 2 hours after meals.   carvedilol  (COREG ) 6.25 MG tablet TAKE 1 TABLET BY MOUTH TWICE DAILY WITH A MEAL   clotrimazole  (LOTRIMIN ) 1 % cream Apply 1 Application topically 2 (two) times daily.   Cyanocobalamin  (VITAMIN B 12 PO) Take by mouth.   diclofenac  Sodium (VOLTAREN ) 1 % GEL Apply 4 g topically 4 (four) times daily. Arthritis   diphenhydrAMINE HCl (BENADRYL ALLERGY PO) Take 1 tablet by mouth as needed.   ezetimibe  (ZETIA ) 10 MG tablet Take 1 tablet by mouth once daily   furosemide  (LASIX ) 40 MG tablet TAKE 1 TO 2 TABLETS BY MOUTH ONCE DAILY AS DIRECTED   glucose blood (ONETOUCH VERIO) test strip USE 1 STRIP TO CHECK BLOOD SUGAR TWO TO THREE TIMES A DAY   metFORMIN  (GLUCOPHAGE -XR) 500 MG 24 hr tablet 3 tabs qd   Multiple Vitamin (MULTIVITAMIN) tablet Take 1 tablet by mouth daily.   OneTouch Delica Lancets 30G MISC Use OneTouch Delica lancets to check blood sugar 3 times weekly, alternating between fasting and 2 hours after meals.   rosuvastatin  (CRESTOR ) 5 MG tablet Take 1 tablet (5 mg total) by mouth daily.  sacubitril -valsartan  (ENTRESTO ) 24-26 MG Take 1 tablet by mouth 2 (two) times daily.   spironolactone  (ALDACTONE ) 25 MG tablet TAKE 1/2 (ONE-HALF) TABLET BY MOUTH ONCE DAILY .   vitamin C (ASCORBIC ACID) 500 MG tablet Take 2,000 mg by mouth as needed.    [DISCONTINUED] PEG 3350 -KCl-NaCl-NaSulf-MgSul (SUFLAVE ) 178.7 g SOLR Take 1 kit by mouth as directed.   No facility-administered encounter medications on file as of 06/28/2023.    Allergies (verified) Jardiance  [empagliflozin ],  Atorvastatin, Other, Pravastatin sodium, and Sulfa antibiotics   History: Past Medical History:  Diagnosis Date   Anxiety    Aortic stenosis, mild    echo 2017   Arthritis    Breast cancer (HCC) 1996   left breast   Carpal tunnel syndrome    Cataract    Chronic diastolic CHF (congestive heart failure) (HCC)    NYHA class II   COVID-19    12/02/21   Diabetes mellitus without complication (HCC)    Hyperlipidemia    Hypertension    Left bundle branch block (LBBB)    Low back pain    Neuropathy    NICM (nonischemic cardiomyopathy) (HCC)    EF 30%, cath 05/20/2015 clean coronary.  EF resolved by echo with EF 55%   OSA on CPAP    Prediabetes    Pulmonary HTN (HCC) 05/2015   Severe with PASP 67/25mmHg - resolved on followup echo   PVC's (premature ventricular contractions) 11/04/2015   Sleep apnea    cpap   Past Surgical History:  Procedure Laterality Date   BREAST SURGERY     CARDIAC CATHETERIZATION N/A 05/20/2015   Procedure: Right/Left Heart Cath and Coronary Angiography;  Surgeon: Millicent Ally, MD;  Location: [facility];  Service: Cardiovascular;  Laterality: N/A;   CATARACT EXTRACTION W/ INTRAOCULAR LENS  IMPLANT, BILATERAL     COLONOSCOPY  2018   DILATION AND CURETTAGE OF UTERUS     lumpectomy left breast     POLYPECTOMY     UTERINE FIBROID SURGERY     Family History  Problem Relation Age of Onset   Arthritis Mother    Depression Mother    Hearing loss Mother    Hyperlipidemia Mother    Miscarriages / [place] Mother    Colon cancer Mother 48       2010   Colon polyps Mother    Alcohol abuse Father    Cancer Father        PROSTATE   Depression Father    Hypertension Father    Hyperlipidemia Father    Drug abuse Brother    Early death Maternal Grandmother    Cancer Paternal Grandmother    Heart attack Neg Hx    Diabetes Neg Hx    Esophageal cancer Neg Hx    Rectal cancer Neg Hx    Stomach cancer Neg Hx    Social History   Socioeconomic  History   Marital status: Significant Other    Spouse name: Not on file   Number of children: 0   Years of education: Not on file   Highest education level: Not on file  Occupational History   Occupation: [employer] doctor   Occupation: Environmental manager  Tobacco Use   Smoking status: Former    Current packs/day: 0.00    Types: Cigarettes    Quit date: 06/18/2009    Years since quitting: 14.0   Smokeless tobacco: Never  Vaping Use   Vaping status: Never Used  Substance and  Sexual Activity   Alcohol use: Yes    Comment: once every 2 months   Drug use: No   Sexual activity: Yes  Other Topics Concern   Not on file  Social History Narrative   ** Merged History Encounter **       Social Drivers of Health   Financial Resource Strain: Low Risk  (06/28/2023)   Overall Financial Resource Strain (CARDIA)    Difficulty of Paying Living Expenses: Not hard at all  Food Insecurity: No Food Insecurity (06/28/2023)   Hunger Vital Sign    Worried About Running Out of Food in the Last Year: Never true    Ran Out of Food in the Last Year: Never true  Transportation Needs: No Transportation Needs (06/28/2023)   PRAPARE - Administrator, Civil Service (Medical): No    Lack of Transportation (Non-Medical): No  Physical Activity: Insufficiently Active (06/28/2023)   Exercise Vital Sign    Days of Exercise per Week: 3 days    Minutes of Exercise per Session: 20 min  Stress: No Stress Concern Present (06/28/2023)   [employer] of Occupational Health - Occupational Stress Questionnaire    Feeling of Stress : Only a little  Social Connections: Moderately Isolated (06/28/2023)   Social Connection and Isolation Panel [NHANES]    Frequency of Communication with Friends and Family: Three times a week    Frequency of Social Gatherings with Friends and Family: Three times a week    Attends Religious Services: More than 4 times per year    Active Member of Clubs or Organizations: No     Attends Banker Meetings: Never    Marital Status: Never married    Tobacco Counseling Counseling given: Not Answered    Clinical Intake:  Pre-visit preparation completed: Yes  Pain : No/denies pain     BMI - recorded: 36.9 Nutritional Status: BMI > 30  Obese Diabetes: Yes CBG done?: No Did pt. bring in CBG monitor from home?: No  Lab Results  Component Value Date   HGBA1C 5.9 (A) 05/30/2023   HGBA1C 6.5 12/07/2022   HGBA1C 5.8 (A) 05/20/2022     How often do you need to have someone help you when you read instructions, pamphlets, or other written materials from your doctor or pharmacy?: 1 - Never  Interpreter Needed?: No  Information entered by :: Lamont Pilsner, LPN   Activities of Daily Living     06/28/2023    8:40 AM 11/01/2022    4:50 PM  In your present state of health, do you have any difficulty performing the following activities:  Hearing? 1 1  Comment hearing aids   Vision? 0 0  Difficulty concentrating or making decisions? 0 0  Walking or climbing stairs? 1 0  Comment bad knees but can do it   Dressing or bathing? 0 0  Doing errands, shopping? 0 0  Preparing Food and eating ? N   Using the Toilet? N   In the past six months, have you accidently leaked urine? Y   Comment wears a pad   Do you have problems with loss of bowel control? N   Managing your Medications? N   Managing your Finances? N   Housekeeping or managing your Housekeeping? N     Patient Care Team: [name], [name], PA-C as PCP - General (Physician Assistant) Jacqueline Matsu, MD as PCP - Cardiology (Cardiology)  Indicate any recent Medical Services you may have received from  other than [facility] providers in the past year (date may be approximate).     Assessment:    This is a routine wellness examination for [facility].  Hearing/Vision screen Hearing Screening - Comments:: Hearing aids  Vision Screening - Comments:: Wears rx glasses - up to date with routine eye  exams with Dr Audrea Blender     Goals Addressed             This Visit's Progress    Patient Stated       Lose weight        Depression Screen      06/28/2023    8:44 AM 05/30/2023   10:36 AM 04/26/2022    9:11 AM 08/10/2021    8:37 AM 01/27/2021   10:09 AM 11/15/2019   10:52 AM 08/23/2019    9:21 AM  PHQ 2/9 Scores  PHQ - 2 Score 0 0 2 0 0 1 0  PHQ- 9 Score   2    2    Fall Risk      06/28/2023    8:45 AM 03/16/2023   12:47 PM 04/26/2022    9:14 AM 12/28/2021    2:12 PM 08/10/2021    8:38 AM  Fall Risk   Falls in the past year? 1 1 0 0 0  Number falls in past yr: 1 0 0 0 0  Injury with Fall? 1 0 0 0 0  Comment head      Risk for fall due to : History of fall(s)  Impaired mobility;Impaired vision;Impaired balance/gait No Fall Risks No Fall Risks  Risk for fall due to: Comment fell out of shower backward      Follow up Falls prevention discussed  Falls prevention discussed Falls evaluation completed     MEDICARE RISK AT HOME:   Medicare Risk at Home Any stairs in or around the home?: Yes If so, are there any without handrails?: No Home free of loose throw rugs in walkways, pet beds, electrical cords, etc?: Yes Adequate lighting in your home to reduce risk of falls?: Yes Life alert?: No Use of a cane, walker or w/c?: No Grab bars in the bathroom?: Yes Shower chair or bench in shower?: No Elevated toilet seat or a handicapped toilet?: No  TIMED UP AND GO:  Was the test performed?  No  Cognitive Function: 6CIT completed        06/28/2023    8:48 AM 04/26/2022    9:16 AM  6CIT Screen  What Year? 0 points 0 points  What month? 0 points 0 points  What time? 0 points 0 points  Count back from 20 0 points 0 points  Months in reverse 0 points 0 points  Repeat phrase 0 points 0 points  Total Score 0 points 0 points    Immunizations Immunization History  Administered Date(s) Administered   Fluad Quad(high Dose 65+) 12/14/2021   Influenza Split 11/25/2013    Influenza, High Dose Seasonal PF 12/25/2019, 12/07/2022   Influenza,inj,Quad PF,6+ Mos 03/12/2015, 01/24/2018, 11/08/2018   Influenza-Unspecified 10/21/2020, 11/15/2021, 12/07/2022   PFIZER Comirnaty(Gray Top)Covid-19 Tri-Sucrose Vaccine 06/07/2020   PFIZER(Purple Top)SARS-COV-2 Vaccination 04/19/2019, 05/15/2019, 12/27/2019   Pfizer Covid-19 Vaccine Bivalent Booster 35yrs & up 06/07/2020, 11/14/2020   Pfizer(Comirnaty)Fall Seasonal Vaccine 12 years and older 03/18/2022, 12/07/2022   Pneumococcal Conjugate-13 03/21/2016   Pneumococcal Polysaccharide-23 05/23/2015, 08/10/2021   RSV,unspecified 11/15/2021   Respiratory Syncytial Virus Vaccine,Recomb Aduvanted(Arexvy) 11/15/2021   Zoster Recombinant(Shingrix) 09/27/2017, 01/24/2018, 11/15/2021    Screening Tests  Health Maintenance  Topic Date Due   COVID-19 Vaccine (9 - Pfizer risk 2024-25 season) 06/07/2023   Colonoscopy  01/31/2024 (Originally 10/16/2022)   DTaP/Tdap/Td (1 - Tdap) 03/08/2024 (Originally 12/21/1974)   DEXA SCAN  03/08/2024 (Originally 12/22/2019)   OPHTHALMOLOGY EXAM  07/19/2023   INFLUENZA VACCINE  09/15/2023   HEMOGLOBIN A1C  11/29/2023   Diabetic kidney evaluation - Urine ACR  12/07/2023   Diabetic kidney evaluation - eGFR measurement  02/22/2024   MAMMOGRAM  03/15/2024   FOOT EXAM  05/29/2024   Medicare Annual Wellness (AWV)  06/27/2024   Pneumonia Vaccine 38+ Years old  Completed   Hepatitis C Screening  Completed   Zoster Vaccines- Shingrix  Completed   HPV VACCINES  Aged Out   Meningococcal B Vaccine  Aged Out    Health Maintenance  Health Maintenance Due  Topic Date Due   COVID-19 Vaccine (9 - Pfizer risk 2024-25 season) 06/07/2023   Health Maintenance Items Addressed: See Nurse Notes  Additional Screening:  Vision Screening: Recommended annual ophthalmology exams for early detection of glaucoma and other disorders of the eye.  Dental Screening: Recommended annual dental exams for proper oral  hygiene  Community Resource Referral / Chronic Care Management: CRR required this visit?  No   CCM required this visit?  No   Plan:    I have personally reviewed and noted the following in the patient's chart:   Medical and social history Use of alcohol, tobacco or illicit drugs  Current medications and supplements including opioid prescriptions. Patient is not currently taking opioid prescriptions. Functional ability and status Nutritional status Physical activity Advanced directives List of other physicians Hospitalizations, surgeries, and ER visits in previous 12 months Vitals Screenings to include cognitive, depression, and falls Referrals and appointments  In addition, I have reviewed and discussed with patient certain preventive protocols, quality metrics, and best practice recommendations. A written personalized care plan for preventive services as well as general preventive health recommendations were provided to patient.   Bruno Capri, LPN   10/13/5619   After Visit Summary: (MyChart) Due to this being a telephonic visit, the after visit summary with patients personalized plan was offered to patient via MyChart   Notes: Nothing significant to report at this time.

## 2023-06-28 NOTE — Patient Instructions (Signed)
 [name] , Thank you for taking time out of your busy schedule to complete your Annual Wellness Visit with me. I enjoyed our conversation and look forward to speaking with you again next year. I, as well as your care team,  appreciate your ongoing commitment to your health goals. Please review the following plan we discussed and let me know if I can assist you in the future. Your Game plan/ To Do List    Referrals: If you haven't heard from the office you've been referred to, please reach out to them at the phone provided.   Follow up Visits: Next Medicare AWV with our clinical staff: 07/02/24   Have you seen your provider in the last 6 months (3 months if uncontrolled diabetes)? Yes Next Office Visit with your provider: 11/29/23  Clinician Recommendations:  Aim for 30 minutes of exercise or brisk walking, 6-8 glasses of water, and 5 servings of fruits and vegetables each day.       This is a list of the screening recommended for you and due dates:  Health Maintenance  Topic Date Due   COVID-19 Vaccine (9 - Pfizer risk 2024-25 season) 06/07/2023   Colon Cancer Screening  01/31/2024*   DTaP/Tdap/Td vaccine (1 - Tdap) 03/08/2024*   DEXA scan (bone density measurement)  03/08/2024*   Eye exam for diabetics  07/19/2023   Flu Shot  09/15/2023   Hemoglobin A1C  11/29/2023   Yearly kidney health urinalysis for diabetes  12/07/2023   Yearly kidney function blood test for diabetes  02/22/2024   Mammogram  03/15/2024   Complete foot exam   05/29/2024   Medicare Annual Wellness Visit  06/27/2024   Pneumonia Vaccine  Completed   Hepatitis C Screening  Completed   Zoster (Shingles) Vaccine  Completed   HPV Vaccine  Aged Out   Meningitis B Vaccine  Aged Out  *Topic was postponed. The date shown is not the original due date.    Advanced directives: (Declined) Advance directive discussed with you today. Even though you declined this today, please call our office should you change your mind, and  we can give you the proper paperwork for you to fill out. Advance Care Planning is important because it:  [x]  Makes sure you receive the medical care that is consistent with your values, goals, and preferences  [x]  It provides guidance to your family and loved ones and reduces their decisional burden about whether or not they are making the right decisions based on your wishes.  Follow the link provided in your after visit summary or read over the paperwork we have mailed to you to help you started getting your Advance Directives in place. If you need assistance in completing these, please reach out to us  so that we can help you!  See attachments for Preventive Care and Fall Prevention Tips.

## 2023-06-29 ENCOUNTER — Ambulatory Visit (HOSPITAL_COMMUNITY)
Admission: RE | Admit: 2023-06-29 | Discharge: 2023-06-29 | Disposition: A | Discharge status: Home / Self Care | Source: Ambulatory Visit | Attending: Cardiovascular Disease | Admitting: Cardiovascular Disease

## 2023-06-29 DIAGNOSIS — I350 Nonrheumatic aortic (valve) stenosis: Secondary | ICD-10-CM | POA: Diagnosis not present

## 2023-06-29 LAB — ECHOCARDIOGRAM COMPLETE
AR max vel: 0.86 cm2
AV Area VTI: 0.78 cm2
AV Area mean vel: 0.85 cm2
AV Mean grad: 32 mmHg
AV Peak grad: 49.9 mmHg
Ao pk vel: 3.53 m/s
Area-P 1/2: 3.54 cm2
Est EF: 45
S' Lateral: 4.1 cm

## 2023-06-30 ENCOUNTER — Ambulatory Visit: Payer: Self-pay | Admitting: Cardiovascular Disease

## 2023-07-14 ENCOUNTER — Other Ambulatory Visit: Payer: Self-pay

## 2023-07-27 ENCOUNTER — Ambulatory Visit: Attending: Cardiovascular Disease | Admitting: Cardiovascular Disease

## 2023-07-27 ENCOUNTER — Encounter: Payer: Self-pay | Admitting: Cardiovascular Disease

## 2023-07-27 ENCOUNTER — Encounter: Payer: Self-pay | Admitting: Endocrinology

## 2023-07-27 ENCOUNTER — Ambulatory Visit: Payer: Medicare Other | Admitting: Endocrinology

## 2023-07-27 VITALS — BP 138/80 | HR 66 | Resp 20 | Ht 64.0 in | Wt 214.0 lb

## 2023-07-27 VITALS — BP 102/64 | HR 81 | Ht 64.0 in | Wt 215.2 lb

## 2023-07-27 DIAGNOSIS — Z7984 Long term (current) use of oral hypoglycemic drugs: Secondary | ICD-10-CM

## 2023-07-27 DIAGNOSIS — I350 Nonrheumatic aortic (valve) stenosis: Secondary | ICD-10-CM

## 2023-07-27 DIAGNOSIS — E119 Type 2 diabetes mellitus without complications: Secondary | ICD-10-CM | POA: Diagnosis not present

## 2023-07-27 NOTE — Progress Notes (Signed)
 Structural Heart Consult Note  Chief Complaint  Patient presents with   Follow-up    Aortic stenosis    History of Present Illness: 68 yo female with history of chronic combined systolic and diastolic CHF, HTN, hyperlipidemia, peripheral neuropathy, PACs, PVCs, LBBB, sleep apnea, former tobacco abuse, obesity, DM and aortic stenosis who is here today for follow up. I saw her as a new consult in February 2025 for further discussion regarding her aortic stenosis and possible TAVR. She has had reduced LV systolic dating back to 2015 with LVEF around 30% at that time. Cardiac cath in 2015 with no evidence of CAD. She has been followed for moderate aortic stenosis. She was seen in our office in July 2024 by Lovette Rud, PA and was doing well. Echo January 2025 with LVEF=45% with global hypokinesis. Grade 2 diastolic dysfunction. Normal RV function. The aortic valve leaflets are thickened and calcified. Severe low flow/low gradient aortic stenosis with mean gradient 31 mmHg, AVA 0.79 cm2, SVI 32, DI 0.26. She had no symptoms at her first visit here in February 2025. Cardiac CT with aortic valve calcium  score of 751. Annular area of 378 mm2 suitable for a 23 mm Sapien valve. CTA with patent vessels favorable for femoral approach to TAVR. Echo 06/29/23 with LVEF=45% with global hypokinesis. Severe aortic stenosis with mean gradient 32 mmHg, AVA 0.78 cm2, SVI 34, DI 0.25.   She is here today for follow up. The patient denies any chest pain, dyspnea, palpitations, lower extremity edema, orthopnea, PND, dizziness, near syncope or syncope.   She lives in [place], [place]. She works as a Midwife for [employer]. She has no active dental issues.   Primary Care Physician: [name], Deleta Felix, PA-C Primary Cardiologist: Micael Adas Referring Cardiologist: Micael Adas  Past Medical History:  Diagnosis Date   Anxiety    Aortic stenosis, mild    echo 2017   Arthritis    Breast cancer (HCC) 1996   left breast   Carpal  tunnel syndrome    Cataract    Chronic diastolic CHF (congestive heart failure) (HCC)    NYHA class II   COVID-19    12/02/21   Diabetes mellitus without complication (HCC)    Hyperlipidemia    Hypertension    Left bundle branch block (LBBB)    Low back pain    Neuropathy    NICM (nonischemic cardiomyopathy) (HCC)    EF 30%, cath 05/20/2015 clean coronary.  EF resolved by echo with EF 55%   OSA on CPAP    Prediabetes    Pulmonary HTN (HCC) 05/2015   Severe with PASP 67/66mmHg - resolved on followup echo   PVC's (premature ventricular contractions) 11/04/2015   Sleep apnea    cpap    Past Surgical History:  Procedure Laterality Date   BREAST SURGERY     CARDIAC CATHETERIZATION N/A 05/20/2015   Procedure: Right/Left Heart Cath and Coronary Angiography;  Surgeon: Millicent Ally, MD;  Location: [facility];  Service: Cardiovascular;  Laterality: N/A;   CATARACT EXTRACTION W/ INTRAOCULAR LENS  IMPLANT, BILATERAL     COLONOSCOPY  2018   DILATION AND CURETTAGE OF UTERUS     lumpectomy left breast     POLYPECTOMY     UTERINE FIBROID SURGERY      Current Outpatient Medications  Medication Sig Dispense Refill   acetaminophen  (TYLENOL ) 650 MG CR tablet Take 1,300 mg by mouth as needed for pain.     aspirin  81 MG tablet  Take 81 mg by mouth daily.     Blood Glucose Monitoring Suppl (ONETOUCH VERIO FLEX SYSTEM) w/Device KIT Use onetouch verio flex meter to check blood sugar 3 times weekly, alternating between fasting and 2 hours after meals. 1 kit 0   carvedilol  (COREG ) 6.25 MG tablet TAKE 1 TABLET BY MOUTH TWICE DAILY WITH A MEAL 180 tablet 3   clotrimazole  (LOTRIMIN ) 1 % cream Apply 1 Application topically 2 (two) times daily. 60 g 0   Cyanocobalamin  (VITAMIN B 12 PO) Take by mouth.     diclofenac  Sodium (VOLTAREN ) 1 % GEL Apply 4 g topically 4 (four) times daily. Arthritis 350 g 1   diphenhydrAMINE HCl (BENADRYL ALLERGY PO) Take 1 tablet by mouth as needed.     ezetimibe  (ZETIA )  10 MG tablet Take 1 tablet by mouth once daily 90 tablet 2   furosemide  (LASIX ) 40 MG tablet TAKE 1 TO 2 TABLETS BY MOUTH ONCE DAILY AS DIRECTED 180 tablet 2   glucose blood (ONETOUCH VERIO) test strip USE 1 STRIP TO CHECK BLOOD SUGAR TWO TO THREE TIMES A DAY 300 each 2   metFORMIN  (GLUCOPHAGE -XR) 500 MG 24 hr tablet 3 tabs qd 270 tablet 3   Multiple Vitamin (MULTIVITAMIN) tablet Take 1 tablet by mouth daily.     OneTouch Delica Lancets 30G MISC Use OneTouch Delica lancets to check blood sugar 3 times weekly, alternating between fasting and 2 hours after meals. 300 each 2   rosuvastatin  (CRESTOR ) 5 MG tablet Take 1 tablet (5 mg total) by mouth daily. 90 tablet 0   sacubitril -valsartan  (ENTRESTO ) 24-26 MG Take 1 tablet by mouth 2 (two) times daily. 180 tablet 0   spironolactone  (ALDACTONE ) 25 MG tablet TAKE 1/2 (ONE-HALF) TABLET BY MOUTH ONCE DAILY . 45 tablet 3   vitamin C (ASCORBIC ACID) 500 MG tablet Take 2,000 mg by mouth as needed.      No current facility-administered medications for this visit.    Allergies  Allergen Reactions   Jardiance  [Empagliflozin ] Anaphylaxis, Itching, Rash and Other (See Comments)    Yeast infection   Atorvastatin Other (See Comments)    REACTION: mylagias   Other Other (See Comments)    Product containing 3-hydroxy-3-methylglutaryl-coenzyme A reductase inhibitor (product)   Pravastatin Sodium Other (See Comments)    REACTION: myalgias   Sulfa Antibiotics Hives    Social History   Socioeconomic History   Marital status: Significant Other    Spouse name: Not on file   Number of children: 0   Years of education: Not on file   Highest education level: Not on file  Occupational History   Occupation: [employer] doctor   Occupation: Environmental manager  Tobacco Use   Smoking status: Former    Current packs/day: 0.00    Types: Cigarettes    Quit date: 06/18/2009    Years since quitting: 14.1   Smokeless tobacco: Never  Vaping Use   Vaping status: Never  Used  Substance and Sexual Activity   Alcohol use: Yes    Comment: once every 2 months   Drug use: No   Sexual activity: Yes  Other Topics Concern   Not on file  Social History Narrative   ** Merged History Encounter **       Social Drivers of Health   Financial Resource Strain: Low Risk  (06/28/2023)   Overall Financial Resource Strain (CARDIA)    Difficulty of Paying Living Expenses: Not hard at all  Food Insecurity: No Food Insecurity (06/28/2023)  Hunger Vital Sign    Worried About Running Out of Food in the Last Year: Never true    Ran Out of Food in the Last Year: Never true  Transportation Needs: No Transportation Needs (06/28/2023)   PRAPARE - Administrator, Civil Service (Medical): No    Lack of Transportation (Non-Medical): No  Physical Activity: Insufficiently Active (06/28/2023)   Exercise Vital Sign    Days of Exercise per Week: 3 days    Minutes of Exercise per Session: 20 min  Stress: No Stress Concern Present (06/28/2023)   [employer] of Occupational Health - Occupational Stress Questionnaire    Feeling of Stress : Only a little  Social Connections: Moderately Isolated (06/28/2023)   Social Connection and Isolation Panel    Frequency of Communication with Friends and Family: Three times a week    Frequency of Social Gatherings with Friends and Family: Three times a week    Attends Religious Services: More than 4 times per year    Active Member of Clubs or Organizations: No    Attends Banker Meetings: Never    Marital Status: Never married  Intimate Partner Violence: Not At Risk (06/28/2023)   Humiliation, Afraid, Rape, and Kick questionnaire    Fear of Current or Ex-Partner: No    Emotionally Abused: No    Physically Abused: No    Sexually Abused: No    Family History  Problem Relation Age of Onset   Arthritis Mother    Depression Mother    Hearing loss Mother    Hyperlipidemia Mother    Miscarriages / Stillbirths Mother     Colon cancer Mother 64       2010   Colon polyps Mother    Alcohol abuse Father    Cancer Father        PROSTATE   Depression Father    Hypertension Father    Hyperlipidemia Father    Drug abuse Brother    Early death Maternal Grandmother    Cancer Paternal Grandmother    Heart attack Neg Hx    Diabetes Neg Hx    Esophageal cancer Neg Hx    Rectal cancer Neg Hx    Stomach cancer Neg Hx     Review of Systems:  As stated in the HPI and otherwise negative.   BP 102/64   Pulse 81   Ht 5' 4 (1.626 m)   Wt 215 lb 3.2 oz (97.6 kg)   SpO2 98%   BMI 36.94 kg/m   Physical Examination: General: Well developed, well nourished, NAD  HEENT: OP clear, mucus membranes moist  SKIN: warm, dry. No rashes. Neuro: No focal deficits  Musculoskeletal: Muscle strength 5/5 all ext  Psychiatric: Mood and affect normal  Neck: No JVD, no carotid bruits, no thyromegaly, no lymphadenopathy.  Lungs:Clear bilaterally, no wheezes, rhonci, crackles Cardiovascular: Regular rate and rhythm. Harsh systolic murmur.  Abdomen:Soft. Bowel sounds present. Non-tender.  Extremities: No lower extremity edema. Pulses are 2 + in the bilateral DP/PT.   EKG:  EKG is ordered today. The ekg ordered today demonstrates  EKG Interpretation Date/Time:  Thursday July 27 2023 13:53:16 EDT Ventricular Rate:  66 PR Interval:  168 QRS Duration:  136 QT Interval:  434 QTC Calculation: 454 R Axis:   -21  Text Interpretation: Normal sinus rhythm Biatrial enlargement Left ventricular hypertrophy with QRS widening ( Cornell product ) Poor R-wave progression Confirmed by Antoinette Batman [phone]) on 07/27/2023 1:57:41 PM  Echo 06/29/23:   1. Left ventricular ejection fraction, by estimation, is 45%. Left  ventricular ejection fraction by 3D volume is 45 %. The left ventricle has  mildly decreased function. The left ventricle demonstrates global  hypokinesis. Left ventricular diastolic  parameters are consistent  with Grade II diastolic dysfunction  (pseudonormalization). Elevated left atrial pressure. The average left  ventricular global longitudinal strain is -10.9 %. The global longitudinal  strain is abnormal.   2. Right ventricular systolic function is normal. The right ventricular  size is normal. Tricuspid regurgitation signal is inadequate for assessing  PA pressure.   3. Left atrial size was moderately dilated.   4. The mitral valve is degenerative. Trivial mitral valve regurgitation.  No evidence of mitral stenosis. Moderate mitral annular calcification.   5. The aortic valve is tricuspid. There is moderate calcification of the  aortic valve. Aortic valve regurgitation is not visualized. Severe aortic  valve stenosis. Aortic valve area, by VTI measures 0.78 cm. Aortic valve  mean gradient measures 32.0  mmHg.   6. The inferior vena cava is normal in size with greater than 50%  respiratory variability, suggesting right atrial pressure of 3 mmHg.   FINDINGS   Left Ventricle: Left ventricular ejection fraction, by estimation, is  45%. Left ventricular ejection fraction by 3D volume is 45 %. The left  ventricle has mildly decreased function. The left ventricle demonstrates  global hypokinesis. The average left  ventricular global longitudinal strain is -10.9 %. Strain was performed  and the global longitudinal strain is abnormal. The left ventricular  internal cavity size was normal in size. There is no left ventricular  hypertrophy. Left ventricular diastolic  parameters are consistent with Grade II diastolic dysfunction  (pseudonormalization). Elevated left atrial pressure.   Right Ventricle: The right ventricular size is normal. No increase in  right ventricular wall thickness. Right ventricular systolic function is  normal. Tricuspid regurgitation signal is inadequate for assessing PA  pressure.   Left Atrium: Left atrial size was moderately dilated.   Right Atrium: Right  atrial size was normal in size.   Pericardium: There is no evidence of pericardial effusion.   Mitral Valve: The mitral valve is degenerative in appearance. There is  moderate calcification of the mitral valve leaflet(s). Moderate mitral  annular calcification. Trivial mitral valve regurgitation. No evidence of  mitral valve stenosis.   Tricuspid Valve: The tricuspid valve is normal in structure. Tricuspid  valve regurgitation is not demonstrated.   Aortic Valve: The aortic valve is tricuspid. There is moderate  calcification of the aortic valve. Aortic valve regurgitation is not  visualized. Severe aortic stenosis is present. Aortic valve mean gradient  measures 32.0 mmHg. Aortic valve peak gradient  measures 49.9 mmHg. Aortic valve area, by VTI measures 0.78 cm.   Pulmonic Valve: The pulmonic valve was normal in structure. Pulmonic valve  regurgitation is not visualized.   Aorta: The aortic root is normal in size and structure.   Venous: The inferior vena cava is normal in size with greater than 50%  respiratory variability, suggesting right atrial pressure of 3 mmHg.   IAS/Shunts: No atrial level shunt detected by color flow Doppler.   Additional Comments: 3D was performed not requiring image post processing  on an independent workstation and was abnormal.     LEFT VENTRICLE  PLAX 2D  LVIDd:         5.40 cm         Diastology  LVIDs:  4.10 cm         LV e' medial:    5.66 cm/s  LV PW:         1.10 cm         LV E/e' medial:  21.0  LV IVS:        1.00 cm         LV e' lateral:   7.40 cm/s  LVOT diam:     2.00 cm         LV E/e' lateral: 16.1  LV SV:         68  LV SV Index:   34              2D Longitudinal  LVOT Area:     3.14 cm        Strain                                 2D Strain GLS   -9.6 %                                 (A4C):                                 2D Strain GLS   -13.0 %                                 (A3C):                                  2D Strain GLS   -9.9 %                                 (A2C):                                 2D Strain GLS   -10.9 %                                 Avg:                                   3D Volume EF                                 LV 3D EF:    Left                                              ventricul                                              ar  ejection                                              fraction                                              by 3D                                              volume is                                              45 %.                                   3D Volume EF:                                 3D EF:        45 %                                 LV EDV:       178 ml                                 LV ESV:       98 ml                                 LV SV:        80 ml   RIGHT VENTRICLE             IVC  RV S prime:     11.90 cm/s  IVC diam: 1.70 cm  TAPSE (M-mode): 2.0 cm   LEFT ATRIUM             Index        RIGHT ATRIUM           Index  LA diam:        4.00 cm 1.98 cm/m   RA Pressure: 3.00 mmHg  LA Vol (A2C):   59.5 ml 29.49 ml/m  RA Area:     14.00 cm  LA Vol (A4C):   97.7 ml 48.43 ml/m  RA Volume:   34.50 ml  17.10 ml/m  LA Biplane Vol: 82.1 ml 40.70 ml/m   AORTIC VALVE  AV Area (Vmax):    0.86 cm  AV Area (Vmean):   0.85 cm  AV Area (VTI):     0.78 cm  AV Vmax:           353.33 cm/s  AV Vmean:          257.667 cm/s  AV VTI:  0.871 m  AV Peak Grad:      49.9 mmHg  AV Mean Grad:      32.0 mmHg  LVOT Vmax:         96.60 cm/s  LVOT Vmean:        69.900 cm/s  LVOT VTI:          0.217 m  LVOT/AV VTI ratio: 0.25    AORTA  Ao Root diam: 2.70 cm  Ao Asc diam:  2.40 cm   MITRAL VALVE                TRICUSPID VALVE  MV Area (PHT): 3.54 cm     Estimated RAP:  3.00 mmHg  MV Decel Time: 214 msec  MV E velocity: 119.00 cm/s  SHUNTS  MV A velocity: 99.60 cm/s    Systemic VTI:  0.22 m  MV E/A ratio:  1.19         Systemic Diam: 2.00 cm   Cardiac CT 04/03/23: Aortic Valve: Tri leaflet AV with calcium  score only 751   Aorta: No aneurysm normal arch vessels moderate calcific atherosclerosis   Sino-tubular Junction: 24.3 mm calcified   Ascending Thoracic Aorta: 27.6 mm   Aortic Arch: 23 mm   Descending Thoracic Aorta: 22 mm   Sinus of Valsalva Measurements:   Non-coronary: 26.5 mm   height 18.9  mm   Right - coronary: 24.1 mm   height 19.6 mm   Left -   coronary: 26.3 mm   height 19.5 mm   Coronary Artery Height above Annulus:   Left Main: 14.9 mm above annulus   Right Coronary: 16.9 mm above annulus   Virtual Basal Annulus Measurements:   Maximum / Minimum Diameter: 23.9 mm x 20.2 mm Average diameter 21.9 mm   Perimeter: 69.7 mm   Area: 378 mm2   Coronary Arteries: Sufficient height above annulus for deployment   Optimum Fluoroscopic Angle for Delivery: RAO 16 Caudal 3 degrees   IMPRESSION: 1. Tri leaflet AV with calcium  score only 751   2. Annular area of 378 mm2 suitable for a 23 mm Sapien 3 valve Sinuses small for 26 mm Medtronic valve   3.  Coronary arteries sufficient height above annulus for deployment   4. Optimum angiographic angle for deployment RAO 16 Caudal 3 degrees   5.  Membranous septal length 8.7 mm  Recent Labs: 12/07/2022: TSH 0.65 02/22/2023: ALT 16; BUN 15; Creatinine 0.66; Hemoglobin 12.7; Platelet Count 297; Potassium 3.9; Sodium 140    Wt Readings from Last 3 Encounters:  07/27/23 215 lb 3.2 oz (97.6 kg)  07/27/23 214 lb (97.1 kg)  06/28/23 215 lb (97.5 kg)    Assessment and Plan:   1. Severe Aortic Valve Stenosis: She has severe stage D2 low flow/low gradient aortic stenosis. NYHA class 1. I have reviewed the echo images. Her valve appears to have severe stenosis. She would be a candidate for surgical AVR or TAVR when we elect to proceed.  She remains asymptomatic.      I have reviewed  the natural history of aortic stenosis with the patient and their family members  who are present today. We have discussed the limitations of medical therapy and the poor prognosis associated with symptomatic aortic stenosis. We have reviewed potential treatment options, including palliative medical therapy, conventional surgical aortic valve replacement, and transcatheter aortic valve replacement. We discussed treatment options in the context of the patient's specific comorbid medical conditions.   Will repeat echo  in November 2025.      Labs/ tests ordered today include:   Orders Placed This Encounter  Procedures   EKG 12-Lead   ECHOCARDIOGRAM COMPLETE   Disposition:   F/U will be arranged with me in 6 months  Signed, Antoinette Batman, MD, [facility] 07/27/2023 2:59 PM    [facility] [address] [place], [place], [place]  [address] Phone: [phone]; Fax: [phone]

## 2023-07-27 NOTE — Progress Notes (Addendum)
 Outpatient Endocrinology Note Joanna Taryll Reichenberger, MD  07/27/23  Patient's Name: [name]    DOB: [birth year]    MRN: [id number]                                                    REASON OF VISIT: Follow up for type 2 diabetes mellitus  PCP: [name], Deleta Felix, PA-C  HISTORY OF PRESENT ILLNESS:   [name] is a 68 y.o. old female with past medical history listed below, is here for follow up for type 2 diabetes mellitus.   Pertinent Diabetes History: Patient was previously seen by Dr. Hubert Madden and was last time seen in April 2024.  Patient was diagnosed with type 2 diabetes mellitus in August 2019, hemoglobin A1c was 6.8% at the time of diagnosis.  Patient has controlled type 2 diabetes mellitus.  Chronic Diabetes Complications : Retinopathy: no. Last ophthalmology exam was done on annually, following with ophthalmology regularly.  Nephropathy: no, on ACE/ARB / valsatan Peripheral neuropathy: no Coronary artery disease: no. Has CHF.  Stroke: no  Relevant comorbidities and cardiovascular risk factors: Obesity: yes Body mass index is 36.73 kg/m.  Hypertension: Yes  Hyperlipidemia : Yes, on statin   Current / Home Diabetic regimen includes:  Metformin  ER 500 mg 3 tablets daily.  Usually with different meals.  Prior diabetic medications: Vaginitis from Jardiance .          Glycemic data:   One Touch Verio Flex glucometer download from May 29 to June 12 reviewed.  Average blood sugar 113.  She has been checking blood sugar 2-3 times a day.  Mostly on the blood sugar in the range of 100- 120 range, rarely up to 140s.  No hypoglycemia.  No hyperglycemia.    Hypoglycemia: Patient has no hypoglycemic episodes. Patient has hypoglycemia awareness.  Factors modifying glucose control: 1.  Diabetic diet assessment: 3 meals a day.  2.  Staying active or exercising: Not able to exercise due to knee pain.  She tries to walk.  3.  Medication compliance: compliant all of the  time.  Interval history  Hemoglobin A1c 2 months ago 5.9%.  She lost about 10 pounds of weight in last 6 months, she has been trying to lose weight with better diet.  No numbness and ting of the feet.  No vision problem.  No other complaints today.  REVIEW OF SYSTEMS As per history of present illness.   PAST MEDICAL HISTORY: Past Medical History:  Diagnosis Date   Anxiety    Aortic stenosis, mild    echo 2017   Arthritis    Breast cancer (HCC) 1996   left breast   Carpal tunnel syndrome    Cataract    Chronic diastolic CHF (congestive heart failure) (HCC)    NYHA class II   COVID-19    12/02/21   Diabetes mellitus without complication (HCC)    Hyperlipidemia    Hypertension    Left bundle branch block (LBBB)    Low back pain    Neuropathy    NICM (nonischemic cardiomyopathy) (HCC)    EF 30%, cath 05/20/2015 clean coronary.  EF resolved by echo with EF 55%   OSA on CPAP    Prediabetes    Pulmonary HTN (HCC) 05/2015   Severe with PASP 67/33mmHg - resolved on followup echo  PVC's (premature ventricular contractions) 11/04/2015   Sleep apnea    cpap    PAST SURGICAL HISTORY: Past Surgical History:  Procedure Laterality Date   BREAST SURGERY     CARDIAC CATHETERIZATION N/A 05/20/2015   Procedure: Right/Left Heart Cath and Coronary Angiography;  Surgeon: Millicent Ally, MD;  Location: [facility];  Service: Cardiovascular;  Laterality: N/A;   CATARACT EXTRACTION W/ INTRAOCULAR LENS  IMPLANT, BILATERAL     COLONOSCOPY  2018   DILATION AND CURETTAGE OF UTERUS     lumpectomy left breast     POLYPECTOMY     UTERINE FIBROID SURGERY      ALLERGIES: Allergies  Allergen Reactions   Jardiance  [Empagliflozin ] Anaphylaxis, Itching, Rash and Other (See Comments)    Yeast infection   Atorvastatin Other (See Comments)    REACTION: mylagias   Other Other (See Comments)    Product containing 3-hydroxy-3-methylglutaryl-coenzyme A reductase inhibitor (product)   Pravastatin  Sodium Other (See Comments)    REACTION: myalgias   Sulfa Antibiotics Hives    FAMILY HISTORY:  Family History  Problem Relation Age of Onset   Arthritis Mother    Depression Mother    Hearing loss Mother    Hyperlipidemia Mother    Miscarriages / Stillbirths Mother    Colon cancer Mother 14       2010   Colon polyps Mother    Alcohol abuse Father    Cancer Father        PROSTATE   Depression Father    Hypertension Father    Hyperlipidemia Father    Drug abuse Brother    Early death Maternal Grandmother    Cancer Paternal Grandmother    Heart attack Neg Hx    Diabetes Neg Hx    Esophageal cancer Neg Hx    Rectal cancer Neg Hx    Stomach cancer Neg Hx     SOCIAL HISTORY: Social History   Socioeconomic History   Marital status: Significant Other    Spouse name: Not on file   Number of children: 0   Years of education: Not on file   Highest education level: Not on file  Occupational History   Occupation: [employer] doctor   Occupation: Environmental manager  Tobacco Use   Smoking status: Former    Current packs/day: 0.00    Types: Cigarettes    Quit date: 06/18/2009    Years since quitting: 14.1   Smokeless tobacco: Never  Vaping Use   Vaping status: Never Used  Substance and Sexual Activity   Alcohol use: Yes    Comment: once every 2 months   Drug use: No   Sexual activity: Yes  Other Topics Concern   Not on file  Social History Narrative   ** Merged History Encounter **       Social Drivers of Health   Financial Resource Strain: Low Risk  (06/28/2023)   Overall Financial Resource Strain (CARDIA)    Difficulty of Paying Living Expenses: Not hard at all  Food Insecurity: No Food Insecurity (06/28/2023)   Hunger Vital Sign    Worried About Running Out of Food in the Last Year: Never true    Ran Out of Food in the Last Year: Never true  Transportation Needs: No Transportation Needs (06/28/2023)   PRAPARE - Administrator, Civil Service (Medical):  No    Lack of Transportation (Non-Medical): No  Physical Activity: Insufficiently Active (06/28/2023)   Exercise Vital Sign  Days of Exercise per Week: 3 days    Minutes of Exercise per Session: 20 min  Stress: No Stress Concern Present (06/28/2023)   [employer] of Occupational Health - Occupational Stress Questionnaire    Feeling of Stress : Only a little  Social Connections: Moderately Isolated (06/28/2023)   Social Connection and Isolation Panel    Frequency of Communication with Friends and Family: Three times a week    Frequency of Social Gatherings with Friends and Family: Three times a week    Attends Religious Services: More than 4 times per year    Active Member of Clubs or Organizations: No    Attends Banker Meetings: Never    Marital Status: Never married    MEDICATIONS:  Current Outpatient Medications  Medication Sig Dispense Refill   acetaminophen  (TYLENOL ) 650 MG CR tablet Take 1,300 mg by mouth as needed for pain.     aspirin  81 MG tablet Take 81 mg by mouth daily.     Blood Glucose Monitoring Suppl (ONETOUCH VERIO FLEX SYSTEM) w/Device KIT Use onetouch verio flex meter to check blood sugar 3 times weekly, alternating between fasting and 2 hours after meals. 1 kit 0   carvedilol  (COREG ) 6.25 MG tablet TAKE 1 TABLET BY MOUTH TWICE DAILY WITH A MEAL 180 tablet 3   clotrimazole  (LOTRIMIN ) 1 % cream Apply 1 Application topically 2 (two) times daily. 60 g 0   Cyanocobalamin  (VITAMIN B 12 PO) Take by mouth.     diclofenac  Sodium (VOLTAREN ) 1 % GEL Apply 4 g topically 4 (four) times daily. Arthritis 350 g 1   diphenhydrAMINE HCl (BENADRYL ALLERGY PO) Take 1 tablet by mouth as needed.     ezetimibe  (ZETIA ) 10 MG tablet Take 1 tablet by mouth once daily 90 tablet 2   furosemide  (LASIX ) 40 MG tablet TAKE 1 TO 2 TABLETS BY MOUTH ONCE DAILY AS DIRECTED 180 tablet 2   glucose blood (ONETOUCH VERIO) test strip USE 1 STRIP TO CHECK BLOOD SUGAR TWO TO THREE TIMES A  DAY 300 each 2   metFORMIN  (GLUCOPHAGE -XR) 500 MG 24 hr tablet 3 tabs qd 270 tablet 3   Multiple Vitamin (MULTIVITAMIN) tablet Take 1 tablet by mouth daily.     OneTouch Delica Lancets 30G MISC Use OneTouch Delica lancets to check blood sugar 3 times weekly, alternating between fasting and 2 hours after meals. 300 each 2   rosuvastatin  (CRESTOR ) 5 MG tablet Take 1 tablet (5 mg total) by mouth daily. 90 tablet 0   sacubitril -valsartan  (ENTRESTO ) 24-26 MG Take 1 tablet by mouth 2 (two) times daily. 180 tablet 0   spironolactone  (ALDACTONE ) 25 MG tablet TAKE 1/2 (ONE-HALF) TABLET BY MOUTH ONCE DAILY . 45 tablet 3   vitamin C (ASCORBIC ACID) 500 MG tablet Take 2,000 mg by mouth as needed.      No current facility-administered medications for this visit.    PHYSICAL EXAM: Vitals:   07/27/23 0907  BP: 138/80  Pulse: 66  Resp: 20  SpO2: 98%  Weight: 214 lb (97.1 kg)  Height: 5' 4 (1.626 m)   Body mass index is 36.73 kg/m.  Wt Readings from Last 3 Encounters:  07/27/23 214 lb (97.1 kg)  06/28/23 215 lb (97.5 kg)  06/16/23 215 lb 3.2 oz (97.6 kg)    General: Well developed, well nourished female in no apparent distress.  HEENT: AT/[place], no external lesions.  Eyes: Conjunctiva clear and no icterus. Neck: Neck supple  Lungs: Respirations not labored  Neurologic: Alert, oriented, normal speech Extremities / Skin: Dry.  Psychiatric: Does not appear depressed or anxious  Diabetic Foot Exam - Simple   No data filed     LABS Reviewed Lab Results  Component Value Date   HGBA1C 5.9 (A) 05/30/2023   HGBA1C 6.5 12/07/2022   HGBA1C 5.8 (A) 05/20/2022   Lab Results  Component Value Date   FRUCTOSAMINE 219 12/23/2019   Lab Results  Component Value Date   CHOL 133 12/07/2022   HDL 54.70 12/07/2022   LDLCALC 61 12/07/2022   TRIG 89.0 12/07/2022   CHOLHDL 2 12/07/2022   Lab Results  Component Value Date   MICRALBCREAT 0.6 12/07/2022   MICRALBCREAT 0.8 05/20/2022   Lab Results   Component Value Date   CREATININE 0.66 02/22/2023   Lab Results  Component Value Date   GFR 90.10 12/07/2022    ASSESSMENT / PLAN  1. Type 2 diabetes mellitus without complication, without long-term current use of insulin  (HCC)    Diabetes Mellitus type 2, complicated by no known complications.  - Diabetic status / severity: controlled.   Lab Results  Component Value Date   HGBA1C 5.9 (A) 05/30/2023    - Hemoglobin A1c goal : <6.5%  Discussed about considering GLP-1 receptor agonist medication to help benefit of weight loss.  Patient wants to think about it.  Does not want to try now due to concern of potential side effects.  - Medications: no change.   I) Metformin  ER 500 mg 3 tablets daily.  - Home glucose testing: At least few times a week at different times of the day.  She wants to check more often to keep track of her diet and control blood sugar.  - Discussed/ Gave Hypoglycemia treatment plan.  # Consult : not required at this time.   # Annual urine for microalbuminuria/ creatinine ratio, no microalbuminuria currently, continue ACE/ARB / valsartan  Last  Lab Results  Component Value Date   MICRALBCREAT 0.6 12/07/2022    # Foot check nightly.  # Annual dilated diabetic eye exams.   - Diet: Eat reasonable portion sizes to promote a healthy weight - Life style / activity / exercise: discussed.  2. Blood pressure  -  BP Readings from Last 1 Encounters:  07/27/23 138/80    - Control is in target.  - No change in current plans.  3. Lipid status / Hyperlipidemia - Last  Lab Results  Component Value Date   LDLCALC 61 12/07/2022   - Continue rosuvastatin  5 mg daily and Zetia  10 mg daily managed by PCP.    Diagnoses and all orders for this visit:  Type 2 diabetes mellitus without complication, without long-term current use of insulin  (HCC)     DISPOSITION Follow up in clinic in 6 months suggested.   All questions answered and patient verbalized  understanding of the plan.  Joanna Renise Gillies, MD [facility] Endocrinology [facility] [address] [place], [address] [place], [place] [address] Phone # [phone]  At least part of this note was generated using voice recognition software. Inadvertent word errors may have occurred, which were not recognized during the proofreading process.

## 2023-07-27 NOTE — Patient Instructions (Signed)
 Medication Instructions:  No changes *If you need a refill on your cardiac medications before your next appointment, please call your pharmacy*  Lab Work: none   Testing/Procedures: Your physician has requested that you have an echocardiogram. Echocardiography is a painless test that uses sound waves to create images of your heart. It provides your doctor with information about the size and shape of your heart and how well your heart's chambers and valves are working. This procedure takes approximately one hour. There are no restrictions for this procedure. Please do NOT wear cologne, perfume, aftershave, or lotions (deodorant is allowed). Please arrive 15 minutes prior to your appointment time.  Please note: We ask at that you not bring children with you during ultrasound (echo/ vascular) testing. Due to room size and safety concerns, children are not allowed in the ultrasound rooms during exams. Our front office staff cannot provide observation of children in our lobby area while testing is being conducted. An adult accompanying a patient to their appointment will only be allowed in the ultrasound room at the discretion of the ultrasound technician under special circumstances. We apologize for any inconvenience.   Follow-Up: At [facility], you and your health needs are our priority.  As part of our continuing mission to provide you with exceptional heart care, our providers are all part of one team.  This team includes your primary Cardiologist (physician) and Advanced Practice Providers or APPs (Physician Assistants and Nurse Practitioners) who all work together to provide you with the care you need, when you need it.  Your next appointment:   6 months -   Provider:   Antoinette Batman, MD   Other Instructions

## 2023-08-02 ENCOUNTER — Other Ambulatory Visit: Payer: Self-pay

## 2023-08-02 ENCOUNTER — Other Ambulatory Visit: Payer: Self-pay | Admitting: Physician Assistant

## 2023-08-02 ENCOUNTER — Other Ambulatory Visit (HOSPITAL_COMMUNITY): Payer: Self-pay

## 2023-08-03 ENCOUNTER — Other Ambulatory Visit: Payer: Self-pay

## 2023-08-03 ENCOUNTER — Other Ambulatory Visit (HOSPITAL_COMMUNITY): Payer: Self-pay

## 2023-08-03 MED ORDER — ENTRESTO 24-26 MG PO TABS
1.0000 | ORAL_TABLET | Freq: Two times a day (BID) | ORAL | 0 refills | Status: DC
  Filled 2023-08-03: qty 180, 90d supply, fill #0

## 2023-08-11 DIAGNOSIS — Z7984 Long term (current) use of oral hypoglycemic drugs: Secondary | ICD-10-CM | POA: Diagnosis not present

## 2023-08-11 DIAGNOSIS — H43813 Vitreous degeneration, bilateral: Secondary | ICD-10-CM | POA: Diagnosis not present

## 2023-08-11 DIAGNOSIS — H04123 Dry eye syndrome of bilateral lacrimal glands: Secondary | ICD-10-CM | POA: Diagnosis not present

## 2023-08-11 DIAGNOSIS — H524 Presbyopia: Secondary | ICD-10-CM | POA: Diagnosis not present

## 2023-08-11 DIAGNOSIS — H52203 Unspecified astigmatism, bilateral: Secondary | ICD-10-CM | POA: Diagnosis not present

## 2023-08-11 DIAGNOSIS — Z961 Presence of intraocular lens: Secondary | ICD-10-CM | POA: Diagnosis not present

## 2023-08-11 DIAGNOSIS — E119 Type 2 diabetes mellitus without complications: Secondary | ICD-10-CM | POA: Diagnosis not present

## 2023-08-11 LAB — HM DIABETES EYE EXAM

## 2023-08-11 NOTE — Progress Notes (Signed)
 [facility] Ophthalmology - [facility] Visit Note 08/11/23    CHIEF COMPLAINT Patient presents for Diabetic Eye Exam  HISTORY OF PRESENT ILLNESS: [name] is a 68 y.o. female who presents to the clinic today for:  HPI   LV 07/19/22 Pt in today for DEE. Pt states her diabetes has been stable. States she's on metformin  for her diabetes. Last A1c was 5.9 checked 05/30/23. Pt c/o her eyes feeling a little dry at times. Denies using any gtts. States she only wears OTC readers when needed. Pt reports seeing floaters at times, but has for a while & nothing new.  Denies any eye pain, discomfort or headaches.  Denies seeing any flashes at this time.   Last edited by Eleanor MALVA Night, COA on 08/11/2023 10:36 AM.    CURRENT MEDICATIONS: Medications Ordered Prior to Encounter[1] Referring physician: No referring provider defined for this encounter.  ALLERGIES Allergies[2]  PAST MEDICAL HISTORY Medical History[3] Surgical History[4] FAMILY HISTORY Family History[5] SOCIAL HISTORY Social History[6]   OPHTHALMIC EXAM:  Base Eye Exam     Visual Acuity (Snellen - Linear)       Right Left   Dist [place] 20/25 -1 20/40 +2         Tonometry (Applanation, 10:52 AM)       Right Left   Pressure 19 24         Pupils       APD   Right None   Left None         Visual Fields (Counting fingers)       Left Right    Full Full         Extraocular Movement       Right Left    Full Full         Neuro/Psych     Oriented x3: Yes   Mood/Affect: Normal         Dilation     Both eyes: 1.0% Tropicamide, 2.5% Phenylephrine @ 10:54 AM           Slit Lamp and Fundus Exam     External Exam       Right Left   External Normal Normal         Slit Lamp Exam       Right Left   Lids/Lashes Normal Normal   Conjunctiva/Sclera White and quiet White and quiet   Cornea Clear Clear   Anterior Chamber Deep and quiet Deep and quiet   Iris Round  Round    Lens PCIOL PCIOL,  s/p YAG         Fundus Exam       Right Left   Vitreous PVD PVD   Disc Normal Normal   C/D Ratio 0.3 0.3   Macula Normal Normal   Vessels Normal Normal   Periphery Normal Normal           Refraction     Manifest Refraction       Sphere Cylinder Axis Dist VA Add Near [place]   Right [place] -1.25 090 20/20-1 +2.50 J1+   Left -1.00 Sphere  20/20 +2.50 J1+         Manifest Refraction #2 (Auto)       Sphere Cylinder Axis Dist VA Add Near [place]   Right -0.50 -0.50 078      Left -1.00 -0.25 116            Final Rx       Sphere  Cylinder Axis Dist VA Add Near [place]   Right [place] -1.25 090 20/20-1 +2.50 J1+   Left -1.00 Sphere  20/20 +2.50 J1+    Expiration Date: 08/10/2025   Comments: Remark: UV filter, Polycarbonate, Tint of choice.  Doctor Recommendations:Anti-Reflective            ASSESSMENT/PLAN:  1. Type 2 diabetes mellitus without retinopathy    (CMD)      2. Posterior vitreous detachment of both eyes      3. Dry eye syndrome of both lacrimal glands      4. Astigmatism with presbyopia, bilateral      5. Pseudophakia of both eyes      6. Diabetes mellitus treated with oral medication    (CMD)       Type II Diabetic: No evidence of retinopathy.  Reviewed labs and discussed with patient. Last A1C was 5.9 on 05/30/2023.  Continue Metformin  as prescribed. Encouraged pt to maintain control of A1C.  PVD/Floaters: No retinal tears or detachments seen; RD Precautions given.    Dry Eye Syndrome OU: Discussed with pt. Use Artifical Tears 3 times daily.   Refractive error OU: refraction for medical purposes only.   Medication ordered this visit:  No orders of the defined types were placed in this encounter.    Return in about 1 year (around 08/10/2024) for DEE.   Patient Instructions   Use artificial tears one drop 3 times a day in both eyes (about breakfast, lunch and dinner).  Okay to use more than 4 times a day if you are using preservative free  artificial tears.  Some good brands include: Systane, Refresh, TheraTears, Genteal, Blink, or Soothe. PLEASE AVOID STORE BRAND EYE DROPS.     --------------------------------------------------------------------------------- Explained the diagnoses, plan, and follow up with the patient and they expressed understanding.  Patient expressed understanding of the importance of proper follow up care.   This document serves as a record of services personally performed by Ozell Lemond Sar, MD.  It was created on their behalf by Veleria LITTIE People, COT, a trained medical scribe, and Certified Ophthalmic Tech (COT). During the course of documenting the history, physical exam and medical decision making, I was functioning as a Stage manager. The creation of this record is the provider's dictation and/or activities during the visit.  Electronically signed by Veleria LITTIE People, COT 08/11/2023 11:28 AM  Abbreviations: M myopia (nearsighted); A astigmatism; H hyperopia (farsighted); P presbyopia; Mrx spectacle prescription;  CTL contact lenses; OD right eye; OS left eye; OU both eyes  XT exotropia; ET esotropia; PEK punctate epithelial keratitis; PEE punctate epithelial erosions; DES dry eye syndrome; MGD meibomian gland dysfunction; ATs artificial tears; PFAT's preservative free artificial tears; NSC nuclear sclerotic cataract; PSC posterior subcapsular cataract; ERM epi-retinal membrane; PVD posterior vitreous detachment; RD retinal detachment; DM diabetes mellitus; DR diabetic retinopathy; NPDR non-proliferative diabetic retinopathy; PDR proliferative diabetic retinopathy; CSME clinically significant macular edema; DME diabetic macular edema; DBH dot blot hemorrhages; CWS cotton wool spot; POAG primary open angle glaucoma; C/D cup-to-disc ratio; HVF humphrey visual field; GVF goldmann visual field; OCT optical coherence tomography; IOP intraocular pressure; BRVO Branch retinal vein occlusion; CRVO central  retinal vein occlusion; CRAO central retinal artery occlusion; BRAO branch retinal artery occlusion; RT retinal tear; SB scleral buckle; PPV pars plana vitrectomy; VH Vitreous hemorrhage; PRP panretinal laser photocoagulation; IVK intravitreal kenalog ; VMT vitreomacular traction; MH Macular hole;  NVD neovascularization of the disc; NVE neovascularization elsewhere; AREDS age related eye disease study; ARMD  age related macular degeneration; POAG primary open angle glaucoma; EBMD epithelial/anterior basement membrane dystrophy; ACIOL anterior chamber intraocular lens; IOL intraocular lens; PCIOL posterior chamber intraocular lens; Phaco/IOL phacoemulsification with intraocular lens placement; PRK photorefractive keratectomy; LASIK laser assisted in situ keratomileusis; HTN hypertension; DM diabetes mellitus; COPD chronic obstructive pulmonary disease   Electronically signed by: Ozell Lemond Sar, MD 08/11/2023 12:06 PM        [1] Current Outpatient Medications on File Prior to Visit  Medication Sig Dispense Refill  . ascorbic acid (VITAMIN C) 500 mg tablet Take 2,000 mg by mouth.    . aspirin  81 mg EC tablet Take 81 mg by mouth.    . carvediloL  (COREG ) 6.25 mg tablet TAKE 1 TABLET BY MOUTH TWICE DAILY WITH A MEAL    . Entresto  24-26 mg per tablet TAKE 1 TABLET BY MOUTH TWICE DAILY    . ezetimibe  (ZETIA ) 10 mg tablet     . furosemide  (LASIX ) 40 mg tablet TAKE 1 TO 2 TABLETS BY MOUTH ONCE DAILY AS DIRECTED    . gabapentin  (NEURONTIN ) 100 mg capsule Take 100 mg by mouth.    SABRA glucosamine HCl/chondroitin su (glucosamine-chondroitin) 500-400 mg per capsule Take by mouth.    . melatonin tablet Take  by mouth.    . metFORMIN  (GLUCOPHAGE -XR) 500 mg 24 hr tablet TAKE 3 TABLETS BY MOUTH ONCE DAILY WITH SUPPER    . multivitamin (THERAGRAN) tab tablet Take 1 tablet by mouth Once Daily.    . rosuvastatin  (CRESTOR ) 5 mg tablet Take 1 tablet by mouth Once Daily.     No current facility-administered  medications on file prior to visit.  [2] Allergies Allergen Reactions  . Empagliflozin  Anaphylaxis, Rash and Other (See Comments)    Yeast infection  . Atorvastatin Other (See Comments)    REACTION: mylagias  . Pravastatin Sodium Other (See Comments)    REACTION: myalgias  . Statins-Hmg-Coa Reductase Inhibitors Other (See Comments)  . Sulfa (Sulfonamide Antibiotics) Hives  [3] Past Medical History: Diagnosis Date  . Diabetes mellitus    (CMD)   . Heart failure    (CMD)   . Hypertension   . OSA (obstructive sleep apnea)   [4] Past Surgical History: Procedure Laterality Date  . CATARACT EXTRACTION Bilateral    Procedure: CATARACT EXTRACTION; MET  . YAG CAPSULOTOMY Left   [5] Family History Problem Relation Name Age of Onset  . Glaucoma Neg Hx    . Macular degeneration Neg Hx    [6] Social History Tobacco Use  . Smoking status: Former  . Smokeless tobacco: Never

## 2023-08-13 ENCOUNTER — Other Ambulatory Visit: Payer: Self-pay | Admitting: Cardiology

## 2023-08-13 ENCOUNTER — Other Ambulatory Visit: Payer: Self-pay | Admitting: Physician Assistant

## 2023-08-14 ENCOUNTER — Other Ambulatory Visit: Payer: Self-pay | Admitting: *Deleted

## 2023-08-14 ENCOUNTER — Telehealth: Payer: Self-pay | Admitting: Cardiology

## 2023-08-14 MED ORDER — CARVEDILOL 6.25 MG PO TABS: 6.2500 mg | ORAL_TABLET | Freq: Two times a day (BID) | ORAL | 0 refills | Status: AC

## 2023-08-14 MED ORDER — SPIRONOLACTONE 25 MG PO TABS: ORAL_TABLET | ORAL | 0 refills | Status: DC

## 2023-08-14 MED ORDER — EZETIMIBE 10 MG PO TABS: 10.0000 mg | ORAL_TABLET | Freq: Every day | ORAL | 0 refills | Status: AC

## 2023-08-14 NOTE — Telephone Encounter (Signed)
 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 Pt would like a c/b as to when she is able to come pick up. Please advise

## 2023-08-15 ENCOUNTER — Telehealth: Payer: Self-pay | Admitting: Gastroenterology

## 2023-08-15 NOTE — Telephone Encounter (Signed)
 Procedure:Colonoscopy Procedure date: 08/24/23 Procedure location: [facility] Arrival Time: 8:00 am Spoke with the patient Y/N:   No, I left a detailed message on [phone],  08/15/23 @ 2:06 pm for the patient to return call  Yes, 08/16/23 @ 2:01 pm   Any prep concerns? No  Has the patient obtained the prep from the pharmacy ? Yes Do you have a care partner and transportation: Yes Any additional concerns? No

## 2023-08-16 ENCOUNTER — Encounter (HOSPITAL_COMMUNITY): Payer: Self-pay | Admitting: Gastroenterology

## 2023-08-16 ENCOUNTER — Telehealth: Payer: Self-pay | Admitting: *Deleted

## 2023-08-16 ENCOUNTER — Other Ambulatory Visit: Payer: Self-pay

## 2023-08-16 NOTE — Telephone Encounter (Signed)
 Request for surgical clearance:     Endoscopy Procedure  What type of surgery is being performed?     colonoscopy  When is this surgery scheduled?     08/24/23  What type of clearance is required ?   Cardiac/Medical  Are there any medications that need to be held prior to surgery and how long? N/a  Practice name and name of physician performing surgery?      [facility]  What is your office phone and fax number?      Phone- [phone]  Fax- [phone]  Anesthesia type (None, local, MAC, general) ?       MAC  Please route your response to Naomie Sharps, RN  Thank you!!

## 2023-08-16 NOTE — Telephone Encounter (Signed)
  Received a call from Hunter Ada, RN with hospital preop team indicating that PA, Harlene Sink has requested cardiac clearance on patient before proceeding with scheduled 08/24/23 colonoscopy appointment at [facility].

## 2023-08-16 NOTE — Progress Notes (Signed)
 Spoke with Dr. Hassan nurse Bertell Sharps. Per Harlene Ward PA-C pt. Needs cardiac clearance. Dottie Smith VU.

## 2023-08-16 NOTE — Progress Notes (Addendum)
 PCP - Allyson [name],PA-C  LOV 05-30-23  Cardiologist - Dr. Shlomo and [name], MD LOV 07-27-23 PULM-Dr. Byrum LOV 05-24-23  PPM/ICD -  Device Orders -  Rep Notified -   Chest x-ray - CT chest 05-18-23 EKG - 07-27-23 epic Stress Test -  ECHO - 06-29-23 epic Cardiac Cath -  CT coronary- 04-19-23  Sleep Study -  CPAP - yes  Fasting Blood Sugar -  Checks Blood Sugar _____ times a day  Blood Thinner Instructions:n/a Aspirin  Instructions:81 mg asa   Pt. Has bowel prep- yes  Activity--Able to climb a flight a flight of staris with no CP or SOB Anesthesia review: CHF, HTN, OSA, DM, LBBB ,Mod. To severe aortic stenosis  Patient denies shortness of breath, fever, cough and chest pain at PAT appointment   All instructions explained to the patient, with a verbal understanding of the material. Patient agrees to go over the instructions while at home for a better understanding. Patient also instructed to self quarantine after being tested for COVID-19. The opportunity to ask questions was provided.

## 2023-08-16 NOTE — Telephone Encounter (Signed)
   Patient Name: [name]  DOB: 05-Aug-1955 MRN: [id number]  Primary Cardiologist: [name], MD  Chart reviewed as part of pre-operative protocol coverage. Given past medical history and time since last visit, based on ACC/AHA guidelines, [name] is at acceptable risk for the planned procedure without further cardiovascular testing.   The patient was advised that if she develops new symptoms prior to surgery to contact our office to arrange for a follow-up visit. She is advised to hold ASA for 7 days prior to the colonoscopy, and she verbalized understanding. She will start back ASAP at the discretion of the GI physician.  I will route this recommendation to the requesting party via Epic fax function and remove from pre-op pool.  Please call with questions.  Lamarr Satterfield, NP 08/16/2023, 2:20 PM

## 2023-08-17 ENCOUNTER — Other Ambulatory Visit: Payer: Self-pay | Admitting: Physician Assistant

## 2023-08-17 ENCOUNTER — Encounter: Payer: Self-pay | Admitting: Physician Assistant

## 2023-08-17 ENCOUNTER — Other Ambulatory Visit: Payer: Self-pay

## 2023-08-17 ENCOUNTER — Other Ambulatory Visit: Payer: Self-pay | Admitting: Cardiology

## 2023-08-17 DIAGNOSIS — R809 Proteinuria, unspecified: Secondary | ICD-10-CM

## 2023-08-17 DIAGNOSIS — E1169 Type 2 diabetes mellitus with other specified complication: Secondary | ICD-10-CM

## 2023-08-17 MED ORDER — ONETOUCH ULTRASOFT LANCETS MISC: 12 refills | Status: DC

## 2023-08-17 NOTE — Telephone Encounter (Signed)
 Please see pt msg and advise

## 2023-08-19 ENCOUNTER — Telehealth: Payer: Self-pay | Admitting: Physician Assistant

## 2023-08-19 NOTE — Telephone Encounter (Signed)
 Received call today from the patient's friend inquiring about dietary instructions precolonoscopy.  Patient scheduled for procedure on 08/24/2023 Recommendations were discussed.

## 2023-08-23 ENCOUNTER — Encounter (HOSPITAL_COMMUNITY): Payer: Self-pay | Admitting: Gastroenterology

## 2023-08-23 NOTE — Telephone Encounter (Signed)
 Noted

## 2023-08-23 NOTE — Anesthesia Preprocedure Evaluation (Addendum)
 Anesthesia Evaluation  Patient identified by MRN, date of birth, ID band Patient awake    Reviewed: Allergy & Precautions, NPO status , Patient's Chart, lab work & pertinent test results, reviewed documented beta blocker date and time   Airway Mallampati: II  TM Distance: >3 FB     Dental no notable dental hx.    Pulmonary sleep apnea and Continuous Positive Airway Pressure Ventilation , pneumonia, resolved, former smoker   Pulmonary exam normal breath sounds clear to auscultation       Cardiovascular hypertension, Pt. on medications and Pt. on home beta blockers pulmonary hypertension+CHF  Normal cardiovascular exam+ dysrhythmias Supra Ventricular Tachycardia + Valvular Problems/Murmurs AS  Rhythm:Regular Rate:Normal  Hx/o NICM  and CHF on entresto   Cardiac Cath 05/2015 Significant right heart pressure elevation with severe pulmonary hypertension.   Normal coronary arteries.  Echo 06/29/23  1. Left ventricular ejection fraction, by estimation, is 45%. Left ventricular ejection fraction by 3D volume is 45 %. The left ventricle has mildly decreased function. The left ventricle demonstrates global hypokinesis. Left ventricular diastolic parameters are consistent with Grade II diastolic dysfunction (pseudonormalization). Elevated left atrial pressure. The average left ventricular global longitudinal strain is -10.9 %. The global longitudinal strain is abnormal.  2. Right ventricular systolic function is normal. The right ventricular size is normal. Tricuspid regurgitation signal is inadequate for assessing PA pressure.  3. Left atrial size was moderately dilated.  4. The mitral valve is degenerative. Trivial mitral valve regurgitation. No evidence of mitral stenosis. Moderate mitral annular calcification.  5. The aortic valve is tricuspid. There is moderate calcification of the aortic valve. Aortic valve regurgitation is not  visualized. Severe aortic valve stenosis. Aortic valve area, by VTI measures 0.78 cm. Aortic valve mean gradient measures 32.0 mmHg.  6. The inferior vena cava is normal in size with greater than 50% respiratory variability, suggesting right atrial pressure of 3 mmHg.  EKG 07/30/23 Normal sinus rhythm Biatrial enlargement Left ventricular hypertrophy with QRS widening ( Cornell product ) Poor R-wave progression   Neuro/Psych   Anxiety     Peripheral neuropathy  Neuromuscular disease    GI/Hepatic Neg liver ROS,,,Hx/o Colon Ca Hx/o colon polyps   Endo/Other  diabetes, Well Controlled, Type 2, Oral Hypoglycemic Agents  Obesity HLD  Renal/GU negative Renal ROS  negative genitourinary   Musculoskeletal  (+) Arthritis , Osteoarthritis,    Abdominal  (+) + obese  Peds  Hematology   Anesthesia Other Findings   Reproductive/Obstetrics                              Anesthesia Physical Anesthesia Plan  ASA: 3  Anesthesia Plan: MAC   Post-op Pain Management: Minimal or no pain anticipated   Induction: Intravenous  PONV Risk Score and Plan: 3 and Treatment may vary due to age or medical condition and Propofol  infusion  Airway Management Planned: Natural Airway, Simple Face Mask and Nasal Cannula  Additional Equipment: None  Intra-op Plan:   Post-operative Plan:   Informed Consent: I have reviewed the patients History and Physical, chart, labs and discussed the procedure including the risks, benefits and alternatives for the proposed anesthesia with the patient or authorized representative who has indicated his/her understanding and acceptance.     Dental advisory given  Plan Discussed with: Anesthesiologist and CRNA  Anesthesia Plan Comments:          Anesthesia Quick Evaluation

## 2023-08-24 ENCOUNTER — Ambulatory Visit (HOSPITAL_COMMUNITY): Payer: Self-pay | Admitting: Physician Assistant

## 2023-08-24 ENCOUNTER — Ambulatory Visit (HOSPITAL_COMMUNITY)
Admission: RE | Admit: 2023-08-24 | Discharge: 2023-08-24 | Disposition: A | Discharge status: Home / Self Care | Source: Ambulatory Visit | Attending: Gastroenterology | Admitting: Gastroenterology

## 2023-08-24 ENCOUNTER — Encounter (HOSPITAL_COMMUNITY)
Admission: RE | Disposition: A | Discharge status: Home / Self Care | Payer: Self-pay | Source: Ambulatory Visit | Attending: Gastroenterology

## 2023-08-24 ENCOUNTER — Encounter (HOSPITAL_COMMUNITY): Payer: Self-pay | Admitting: Gastroenterology

## 2023-08-24 ENCOUNTER — Other Ambulatory Visit: Payer: Self-pay

## 2023-08-24 DIAGNOSIS — E669 Obesity, unspecified: Secondary | ICD-10-CM | POA: Diagnosis not present

## 2023-08-24 DIAGNOSIS — I472 Ventricular tachycardia, unspecified: Secondary | ICD-10-CM | POA: Insufficient documentation

## 2023-08-24 DIAGNOSIS — Z860101 Personal history of adenomatous and serrated colon polyps: Secondary | ICD-10-CM | POA: Diagnosis not present

## 2023-08-24 DIAGNOSIS — K635 Polyp of colon: Secondary | ICD-10-CM

## 2023-08-24 DIAGNOSIS — G4733 Obstructive sleep apnea (adult) (pediatric): Secondary | ICD-10-CM | POA: Insufficient documentation

## 2023-08-24 DIAGNOSIS — I428 Other cardiomyopathies: Secondary | ICD-10-CM | POA: Diagnosis not present

## 2023-08-24 DIAGNOSIS — I272 Pulmonary hypertension, unspecified: Secondary | ICD-10-CM | POA: Diagnosis not present

## 2023-08-24 DIAGNOSIS — Z1211 Encounter for screening for malignant neoplasm of colon: Secondary | ICD-10-CM | POA: Insufficient documentation

## 2023-08-24 DIAGNOSIS — I110 Hypertensive heart disease with heart failure: Secondary | ICD-10-CM

## 2023-08-24 DIAGNOSIS — Z860100 Personal history of colon polyps, unspecified: Secondary | ICD-10-CM | POA: Diagnosis not present

## 2023-08-24 DIAGNOSIS — Z800 Family history of malignant neoplasm of digestive organs: Secondary | ICD-10-CM | POA: Diagnosis not present

## 2023-08-24 DIAGNOSIS — I5042 Chronic combined systolic (congestive) and diastolic (congestive) heart failure: Secondary | ICD-10-CM | POA: Diagnosis not present

## 2023-08-24 DIAGNOSIS — E785 Hyperlipidemia, unspecified: Secondary | ICD-10-CM | POA: Insufficient documentation

## 2023-08-24 DIAGNOSIS — K648 Other hemorrhoids: Secondary | ICD-10-CM | POA: Diagnosis not present

## 2023-08-24 DIAGNOSIS — M1990 Unspecified osteoarthritis, unspecified site: Secondary | ICD-10-CM | POA: Insufficient documentation

## 2023-08-24 DIAGNOSIS — D120 Benign neoplasm of cecum: Secondary | ICD-10-CM | POA: Diagnosis not present

## 2023-08-24 DIAGNOSIS — Z6836 Body mass index (BMI) 36.0-36.9, adult: Secondary | ICD-10-CM | POA: Diagnosis not present

## 2023-08-24 DIAGNOSIS — Z79899 Other long term (current) drug therapy: Secondary | ICD-10-CM | POA: Diagnosis not present

## 2023-08-24 DIAGNOSIS — G4730 Sleep apnea, unspecified: Secondary | ICD-10-CM | POA: Insufficient documentation

## 2023-08-24 DIAGNOSIS — I350 Nonrheumatic aortic (valve) stenosis: Secondary | ICD-10-CM

## 2023-08-24 DIAGNOSIS — Z7984 Long term (current) use of oral hypoglycemic drugs: Secondary | ICD-10-CM | POA: Diagnosis not present

## 2023-08-24 DIAGNOSIS — D123 Benign neoplasm of transverse colon: Secondary | ICD-10-CM | POA: Diagnosis not present

## 2023-08-24 DIAGNOSIS — D126 Benign neoplasm of colon, unspecified: Secondary | ICD-10-CM

## 2023-08-24 DIAGNOSIS — E1142 Type 2 diabetes mellitus with diabetic polyneuropathy: Secondary | ICD-10-CM | POA: Insufficient documentation

## 2023-08-24 DIAGNOSIS — Z87891 Personal history of nicotine dependence: Secondary | ICD-10-CM | POA: Insufficient documentation

## 2023-08-24 DIAGNOSIS — I5032 Chronic diastolic (congestive) heart failure: Secondary | ICD-10-CM | POA: Diagnosis not present

## 2023-08-24 DIAGNOSIS — K5730 Diverticulosis of large intestine without perforation or abscess without bleeding: Secondary | ICD-10-CM | POA: Insufficient documentation

## 2023-08-24 HISTORY — PX: COLONOSCOPY: SHX5424

## 2023-08-24 HISTORY — DX: Cardiac murmur, unspecified: R01.1

## 2023-08-24 HISTORY — DX: Pneumonia, unspecified organism: J18.9

## 2023-08-24 LAB — GLUCOSE, CAPILLARY: Glucose-Capillary: 105 mg/dL — ABNORMAL HIGH (ref 70–99)

## 2023-08-24 SURGERY — COLONOSCOPY
Anesthesia: Monitor Anesthesia Care

## 2023-08-24 MED ORDER — SODIUM CHLORIDE 0.9% FLUSH: 3.0000 mL | INTRAVENOUS | Status: DC | PRN

## 2023-08-24 MED ORDER — SODIUM CHLORIDE 0.9% FLUSH: 3.0000 mL | Freq: Two times a day (BID) | INTRAVENOUS | Status: DC

## 2023-08-24 MED ORDER — PROPOFOL 1000 MG/100ML IV EMUL
INTRAVENOUS | Status: AC
  Filled 2023-08-24: qty 100

## 2023-08-24 MED ORDER — ONDANSETRON HCL 4 MG/2ML IJ SOLN
INTRAMUSCULAR | Status: DC | PRN
  Administered 2023-08-24: 4 mg via INTRAVENOUS

## 2023-08-24 MED ORDER — PROPOFOL 500 MG/50ML IV EMUL
INTRAVENOUS | Status: DC | PRN
  Administered 2023-08-24: 20 mg via INTRAVENOUS
  Administered 2023-08-24: 125 ug/kg/min via INTRAVENOUS

## 2023-08-24 MED ORDER — SODIUM CHLORIDE 0.9 % IV SOLN: INTRAVENOUS | Status: DC | PRN

## 2023-08-24 NOTE — Op Note (Signed)
 [facility] Patient Name: [name] Procedure Date: 08/24/2023 MRN: [id number] Attending MD: Elspeth SQUIBB. Leigh , MD, [id number] Date of Birth: [birth year] CSN: [id number] Age: 68 Admit Type: Outpatient Procedure:                Colonoscopy Indications:              High risk colon cancer surveillance: Personal                            history of colonic polyps - 2018 - 9 polyps, 2021 -                            11 polyps, mother with colon cancer dx age 13s Providers:                Elspeth SQUIBB. Leigh, MD, Darleene Bare, RN, Corene Southgate, Technician Referring MD:              Medicines:                Monitored Anesthesia Care Complications:            No immediate complications. Estimated blood loss:                            Minimal. Estimated Blood Loss:     Estimated blood loss was minimal. Procedure:                Pre-Anesthesia Assessment:                           - Prior to the procedure, a History and Physical                            was performed, and patient medications and                            allergies were reviewed. The patient's tolerance of                            previous anesthesia was also reviewed. The risks                            and benefits of the procedure and the sedation                            options and risks were discussed with the patient.                            All questions were answered, and informed consent                            was obtained. Prior Anticoagulants: The patient has  taken no anticoagulant or antiplatelet agents. ASA                            Grade Assessment: III - A patient with severe                            systemic disease. After reviewing the risks and                            benefits, the patient was deemed in satisfactory                            condition to undergo the procedure.                           After  obtaining informed consent, the colonoscope                            was passed under direct vision. Throughout the                            procedure, the patient's blood pressure, pulse, and                            oxygen saturations were monitored continuously. The                            CF-HQ190L ([id number] Olympus colonoscope was                            introduced through the anus and advanced to the the                            cecum, identified by appendiceal orifice and                            ileocecal valve. The colonoscopy was performed                            without difficulty. The patient tolerated the                            procedure well. The quality of the bowel                            preparation was good. The ileocecal valve,                            appendiceal orifice, and rectum were photographed. Scope In: 10:11:42 AM Scope Out: 10:35:34 AM Scope Withdrawal Time: 0 hours 19 minutes 20 seconds  Total Procedure Duration: 0 hours 23 minutes 52 seconds  Findings:      The perianal and digital rectal examinations were normal.      A 4 mm polyp was found in the ileocecal valve.  The polyp was flat. The       polyp was removed with a cold snare. Resection and retrieval were       complete.      A 3 mm polyp was found in the transverse colon. The polyp was flat. The       polyp was removed with a cold snare. Resection and retrieval were       complete.      Multiple diverticula were found in the left colon.      Internal hemorrhoids were found during retroflexion.      The exam was otherwise without abnormality. Impression:               - One 4 mm polyp at the ileocecal valve, removed                            with a cold snare. Resected and retrieved.                           - One 3 mm polyp in the transverse colon, removed                            with a cold snare. Resected and retrieved.                           - Diverticulosis in  the left colon.                           - Internal hemorrhoids.                           - The examination was otherwise normal. Moderate Sedation:      No moderate sedation, case performed with MAC Recommendation:           - Patient has a contact number available for                            emergencies. The signs and symptoms of potential                            delayed complications were discussed with the                            patient. Return to normal activities tomorrow.                            Written discharge instructions were provided to the                            patient.                           - Resume previous diet.                           - Continue present medications.                           -  Await pathology results. Anticipate repeat                            colonoscopy in 5 years given burden of polyps                            removed in the past and family history of colon                            cancer. Procedure Code(s):        --- Professional ---                           [phone], Colonoscopy, flexible; with removal of                            tumor(s), polyp(s), or other lesion(s) by snare                            technique Diagnosis Code(s):        --- Professional ---                           Z86.010, Personal history of colonic polyps                           D12.0, Benign neoplasm of cecum                           D12.3, Benign neoplasm of transverse colon (hepatic                            flexure or splenic flexure)                           K64.8, Other hemorrhoids                           K57.30, Diverticulosis of large intestine without                            perforation or abscess without bleeding CPT copyright 2022 American Medical Association. All rights reserved. The codes documented in this report are preliminary and upon coder review may  be revised to meet current compliance requirements. Elspeth P.  Joanna Porco, MD 08/24/2023 10:46:02 AM This report has been signed electronically. Number of Addenda: 0

## 2023-08-24 NOTE — Anesthesia Procedure Notes (Signed)
 Procedure Name: MAC Date/Time: 08/24/2023 9:56 AM  Performed by: Zulema Leita PARAS, CRNAPre-anesthesia Checklist: Patient identified, Emergency Drugs available, Suction available and Patient being monitored Oxygen Delivery Method: Simple face mask

## 2023-08-24 NOTE — Transfer of Care (Signed)
 Immediate Anesthesia Transfer of Care Note  Patient: [name]  Procedure(s) Performed: COLONOSCOPY  Patient Location: PACU and Endoscopy Unit  Anesthesia Type:MAC  Level of Consciousness: drowsy  Airway & Oxygen Therapy: Patient Spontanous Breathing and Patient connected to face mask oxygen  Post-op Assessment: Report given to RN and Post -op Vital signs reviewed and stable  Post vital signs: Reviewed and stable  Last Vitals:  Vitals Value Taken Time  BP    Temp    Pulse 76 08/24/23 10:41  Resp 16 08/24/23 10:41  SpO2 98 % 08/24/23 10:41  Vitals shown include unfiled device data.  Last Pain:  Vitals:   08/24/23 0814  TempSrc: Temporal  PainSc: 0-No pain         Complications: No notable events documented.

## 2023-08-24 NOTE — Anesthesia Postprocedure Evaluation (Addendum)
 Anesthesia Post Note  Patient: [name]  Procedure(s) Performed: COLONOSCOPY     Patient location during evaluation: PACU Anesthesia Type: MAC Level of consciousness: awake and alert and oriented Pain management: pain level controlled Vital Signs Assessment: post-procedure vital signs reviewed and stable Respiratory status: spontaneous breathing, nonlabored ventilation and respiratory function stable Cardiovascular status: stable and blood pressure returned to baseline Postop Assessment: no apparent nausea or vomiting Anesthetic complications: no   No notable events documented.  Last Vitals:  Vitals:   08/24/23 1100 08/24/23 1101  BP:  (!) 160/97  Pulse: 64 70  Resp: 14 (!) 23  Temp:    SpO2: 99% 100%    Last Pain:  Vitals:   08/24/23 1101  TempSrc:   PainSc: 0-No pain                 Eshal Propps A.

## 2023-08-24 NOTE — Discharge Instructions (Signed)

## 2023-08-24 NOTE — H&P (Signed)
 [facility] History and Physical   Primary Care Physician:  [name], [name], PA-C   Reason for Procedure:   History of colon polyps  Plan:    colonoscopy     HPI: [name] is a 68 y.o. female  here for colonoscopy surveillance - numerous adenomas removed in the past. 9 polyps 2018, 11 polyps 2011. Mother had CRC dx age 52s. Otherwise feels well without any cardiopulmonary symptoms. History of severe AS, case done at the hospital for anesthesia support.   I have discussed risks / benefits of anesthesia and endoscopic procedure with [name] and they wish to proceed with the exams as outlined today.    Past Medical History:  Diagnosis Date   Anxiety    Aortic stenosis, mod to severe    echo 2017   Arthritis    Breast cancer (HCC) 1996   left breast   Carpal tunnel syndrome    Cataract    Chronic diastolic CHF (congestive heart failure) (HCC)    NYHA class II   COVID-19    12/02/21   Diabetes mellitus without complication (HCC)    type 2   Heart murmur    Hyperlipidemia    Hypertension    Left bundle branch block (LBBB)    Low back pain    Neuropathy    NICM (nonischemic cardiomyopathy) (HCC)    EF 30%, cath 05/20/2015 clean coronary.  EF resolved by echo with EF 55%   OSA on CPAP    Pneumonia    Prediabetes    Pulmonary HTN (HCC) 05/2015   Severe with PASP 67/22mmHg - resolved on followup echo   PVC's (premature ventricular contractions) 11/04/2015   Sleep apnea    cpap    Past Surgical History:  Procedure Laterality Date   CARDIAC CATHETERIZATION N/A 05/20/2015   Procedure: Right/Left Heart Cath and Coronary Angiography;  Surgeon: Debby DELENA Sor, MD;  Location: [facility] INVASIVE CV LAB;  Service: Cardiovascular;  Laterality: N/A;   CATARACT EXTRACTION W/ INTRAOCULAR LENS  IMPLANT, BILATERAL     COLONOSCOPY  2018   DILATION AND CURETTAGE OF UTERUS     lumpectomy left breast     POLYPECTOMY     UTERINE FIBROID SURGERY      Prior to  Admission medications   Medication Sig Start Date End Date Taking? Authorizing Provider  acetaminophen  (TYLENOL ) 650 MG CR tablet Take 1,300 mg by mouth as needed for pain.   Yes [provider]  aspirin  81 MG tablet Take 81 mg by mouth daily.   Yes [provider]  carvedilol  (COREG ) 6.25 MG tablet TAKE 1 TABLET BY MOUTH TWICE DAILY WITH A MEAL 08/14/23  Yes Verlin Lonni BIRCH, MD  Cyanocobalamin  (VITAMIN B 12 PO) Take by mouth.   Yes [provider]  diphenhydrAMINE HCl (BENADRYL ALLERGY PO) Take 1 tablet by mouth as needed.   Yes [provider]  ezetimibe  (ZETIA ) 10 MG tablet Take 1 tablet by mouth once daily 08/14/23  Yes Verlin Lonni BIRCH, MD  furosemide  (LASIX ) 40 MG tablet TAKE 1 TO 2 TABLETS BY MOUTH ONCE DAILY AS DIRECTED 08/17/23  Yes Turner, [name], MD  metFORMIN  (GLUCOPHAGE -XR) 500 MG 24 hr tablet 3 tabs qd 01/26/23  Yes Thapa, [name], MD  Multiple Vitamin (MULTIVITAMIN) tablet Take 1 tablet by mouth daily.   Yes [provider]  rosuvastatin  (CRESTOR ) 5 MG tablet Take 1 tablet by mouth once daily 08/14/23  Yes [name], [name], PA-C  sacubitril -valsartan  (ENTRESTO ) 24-26 MG Take 1 tablet by mouth 2 (two) times daily. 08/03/23  Yes Weaver, [name], PA-C  spironolactone  (ALDACTONE ) 25 MG tablet TAKE 1/2 (ONE-HALF) TABLET BY MOUTH ONCE DAILY . 08/14/23  Yes Turner, [name], MD  vitamin C (ASCORBIC ACID) 500 MG tablet Take 2,000 mg by mouth as needed.    Yes [provider]  Blood Glucose Monitoring Suppl (ONETOUCH VERIO FLEX SYSTEM) w/Device KIT Use onetouch verio flex meter to check blood sugar 3 times weekly, alternating between fasting and 2 hours after meals. 10/03/19   [place], MD  carvedilol  (COREG ) 6.25 MG tablet Take 1 tablet (6.25 mg total) by mouth 2 (two) times daily with a meal. 08/14/23   Turner, [name], MD  clotrimazole  (LOTRIMIN ) 1 % cream Apply 1 Application topically 2 (two) times daily. 12/02/22   [name],  [name], PA-C  diclofenac  Sodium (VOLTAREN ) 1 % GEL Apply 4 g topically 4 (four) times daily. Arthritis 12/02/22   [name], [name], PA-C  ezetimibe  (ZETIA ) 10 MG tablet Take 1 tablet (10 mg total) by mouth daily. 08/14/23   Shlomo [name], MD  glucose blood (ONETOUCH VERIO) test strip USE 1 STRIP TO CHECK BLOOD SUGAR TWO TO THREE TIMES A DAY 01/26/23   Thapa, [name], MD  Lancets [facility] ULTRASOFT) lancets Use to check blood glucose three times a week alternating between fasting and 2 hours after a meal 08/17/23   Thapa, [name], MD  OneTouch Delica Lancets 30G MISC Use OneTouch Delica lancets to check blood sugar 3 times weekly, alternating between fasting and 2 hours after meals. 01/26/23   Thapa, [name], MD    Current Facility-Administered Medications  Medication Dose Route Frequency Provider Last Rate Last Admin   sodium chloride  flush (NS) 0.9 % injection 3-10 mL  3-10 mL Intravenous Q12H [name], Elspeth SQUIBB, MD       sodium chloride  flush (NS) 0.9 % injection 3-10 mL  3-10 mL Intravenous PRN Tee Richeson, Elspeth SQUIBB, MD        Allergies as of 06/13/2023 - Review Complete 06/13/2023  Allergen Reaction Noted   Jardiance  [empagliflozin ] Anaphylaxis, Itching, Rash, and Other (See Comments) 09/24/2019   Atorvastatin Other (See Comments) 04/15/2010   Other Other (See Comments) 05/30/2023   Pravastatin sodium Other (See Comments) 04/15/2010   Sulfa antibiotics Hives 04/19/2010    Family History  Problem Relation Age of Onset   Arthritis Mother    Depression Mother    Hearing loss Mother    Hyperlipidemia Mother    Miscarriages / [place] Mother    Colon cancer Mother 71       2010   Colon polyps Mother    Alcohol abuse Father    Cancer Father        PROSTATE   Depression Father    Hypertension Father    Hyperlipidemia Father    Drug abuse Brother    Early death Maternal Grandmother    Cancer Paternal Grandmother    Heart attack Neg Hx    Diabetes Neg Hx    Esophageal cancer  Neg Hx    Rectal cancer Neg Hx    Stomach cancer Neg Hx     Social History   Socioeconomic History   Marital status: Significant Other    Spouse name: Not on file   Number of children: 0   Years of education: Not on file   Highest education level: Not on file  Occupational History   Occupation: [employer] doctor  Occupation: Environmental manager  Tobacco Use   Smoking status: Former    Current packs/day: 0.00    Types: Cigarettes    Quit date: 06/18/2009    Years since quitting: 14.1   Smokeless tobacco: Never  Vaping Use   Vaping status: Never Used  Substance and Sexual Activity   Alcohol use: Not Currently    Comment: once every 2 months   Drug use: No   Sexual activity: Not Currently  Other Topics Concern   Not on file  Social History Narrative   ** Merged History Encounter **       Social Drivers of Health   Financial Resource Strain: Low Risk  (06/28/2023)   Overall Financial Resource Strain (CARDIA)    Difficulty of Paying Living Expenses: Not hard at all  Food Insecurity: No Food Insecurity (06/28/2023)   Hunger Vital Sign    Worried About Running Out of Food in the Last Year: Never true    Ran Out of Food in the Last Year: Never true  Transportation Needs: No Transportation Needs (06/28/2023)   PRAPARE - Administrator, Civil Service (Medical): No    Lack of Transportation (Non-Medical): No  Physical Activity: Insufficiently Active (06/28/2023)   Exercise Vital Sign    Days of Exercise per Week: 3 days    Minutes of Exercise per Session: 20 min  Stress: No Stress Concern Present (06/28/2023)   [employer] of Occupational Health - Occupational Stress Questionnaire    Feeling of Stress : Only a little  Social Connections: Moderately Isolated (06/28/2023)   Social Connection and Isolation Panel    Frequency of Communication with Friends and Family: Three times a week    Frequency of Social Gatherings with Friends and Family: Three times a week     Attends Religious Services: More than 4 times per year    Active Member of Clubs or Organizations: No    Attends Banker Meetings: Never    Marital Status: Never married  Intimate Partner Violence: Not At Risk (06/28/2023)   Humiliation, Afraid, Rape, and Kick questionnaire    Fear of Current or Ex-Partner: No    Emotionally Abused: No    Physically Abused: No    Sexually Abused: No    Review of Systems: All other review of systems negative except as mentioned in the HPI.  Physical Exam: Vital signs BP 130/72   Temp (!) 97.2 F (36.2 C) (Temporal)   Resp 14   Ht 5' 4 (1.626 m)   Wt 97.5 kg   SpO2 97%   BMI 36.90 kg/m   General:   Alert,  Well-developed, pleasant and cooperative in NAD Lungs:  Clear throughout to auscultation.   Heart:  Regular rate and rhythm Abdomen:  Soft, nontender and nondistended.   Neuro/Psych:  Alert and cooperative. Normal mood and affect. A and O x 3  Marcey Naval, MD [facility]

## 2023-08-25 LAB — SURGICAL PATHOLOGY

## 2023-08-26 ENCOUNTER — Ambulatory Visit: Payer: Self-pay | Admitting: Gastroenterology

## 2023-08-30 ENCOUNTER — Ambulatory Visit (INDEPENDENT_AMBULATORY_CARE_PROVIDER_SITE_OTHER): Admitting: Family Medicine

## 2023-08-30 ENCOUNTER — Encounter: Payer: Self-pay | Admitting: Family Medicine

## 2023-08-30 VITALS — BP 100/65 | HR 83 | Temp 97.2°F | Ht 64.0 in | Wt 211.8 lb

## 2023-08-30 DIAGNOSIS — R809 Proteinuria, unspecified: Secondary | ICD-10-CM

## 2023-08-30 DIAGNOSIS — R059 Cough, unspecified: Secondary | ICD-10-CM | POA: Diagnosis not present

## 2023-08-30 LAB — URINALYSIS, ROUTINE W REFLEX MICROSCOPIC
Bilirubin Urine: NEGATIVE
Ketones, ur: NEGATIVE
Leukocytes,Ua: NEGATIVE
Nitrite: NEGATIVE
Specific Gravity, Urine: 1.02 (ref 1.000–1.030)
Total Protein, Urine: NEGATIVE
Urine Glucose: NEGATIVE
Urobilinogen, UA: 0.2 (ref 0.0–1.0)
pH: 6 (ref 5.0–8.0)

## 2023-08-30 LAB — POCT RAPID STREP A (OFFICE): Rapid Strep A Screen: POSITIVE — AB

## 2023-08-30 LAB — MICROALBUMIN / CREATININE URINE RATIO
Creatinine,U: 104.7 mg/dL
Microalb Creat Ratio: 9 mg/g (ref 0.0–30.0)
Microalb, Ur: 0.9 mg/dL (ref 0.0–1.9)

## 2023-08-30 LAB — POC COVID19 BINAXNOW: SARS Coronavirus 2 Ag: NEGATIVE

## 2023-08-30 MED ORDER — AMOXICILLIN-POT CLAVULANATE 875-125 MG PO TABS
1.0000 | ORAL_TABLET | Freq: Two times a day (BID) | ORAL | 0 refills | Status: DC
Start: 2023-08-30 — End: 2023-12-04

## 2023-08-30 MED ORDER — PROMETHAZINE-DM 6.25-15 MG/5ML PO SYRP: 5.0000 mL | ORAL_SOLUTION | Freq: Four times a day (QID) | ORAL | 0 refills | Status: DC | PRN

## 2023-08-30 NOTE — Progress Notes (Signed)
   [name] is a 68 y.o. female who presents today for an office visit.  Assessment/Plan:  Sore Throat  Rapid strep positive though concern for developing sinusitis.  No red flag signs or symptoms.  She did well with Augmentin  last year.  We will send a 10-day course and for this.  Will also start promethazine -dextromethorphan  cough syrup.  Encouraged hydration.  She can continue over-the-counter meds as needed as well.  We discussed reasons to return to care.  Follow-up as needed.     Subjective:  HPI:  Patient with sore throat and cough for 6 days. A lot of ear pain as well.  Symptoms started after having colonoscopy last week.  She does have a history of pneumonia as well.  Cough is productive of yellow sputum.  She has had some chills.  No fevers.  Tried several over-the-counter medications including Benadryl, Coricidin, and Mucinex  with some improvement.        Objective:  Physical Exam: BP 100/65   Pulse 83   Temp (!) 97.2 F (36.2 C) (Temporal)   Ht 5' 4 (1.626 m)   Wt 211 lb 12.8 oz (96.1 kg)   SpO2 99%   BMI 36.36 kg/m   Gen: No acute distress, resting comfortably HEENT: Left TM with effusion.  Right TM clear.  Nasal mucosa erythematous and boggy bilaterally.  Oropharynx erythematous without exudates.  No lymphadenopathy noted. CV: Regular rate and rhythm with no murmurs appreciated Pulm: Normal work of breathing, clear to auscultation bilaterally with no crackles, wheezes, or rhonchi Neuro: Grossly normal, moves all extremities Psych: Normal affect and thought content      Brette Cast M. Kennyth, MD 08/30/2023 8:51 AM

## 2023-08-30 NOTE — Patient Instructions (Addendum)
 It was very nice to see you today!  Your strep test is positive. You may be developing a sinus infection.   Please start the antibiotic and cough medication.   Please make sure that you get plenty of fluids.  Let us  know if not proving in the next several days.  Return if symptoms worsen or fail to improve.   Take care, Dr Kennyth  PLEASE NOTE:  If you had any lab tests, please let us  know if you have not heard back within a few days. You may see your results on mychart before we have a chance to review them but we will give you a call once they are reviewed by us .   If we ordered any referrals today, please let us  know if you have not heard from their office within the next week.   If you had any urgent prescriptions sent in today, please check with the pharmacy within an hour of our visit to make sure the prescription was transmitted appropriately.   Please try these tips to maintain a healthy lifestyle:  Eat at least 3 REAL meals and 1-2 snacks per day.  Aim for no more than 5 hours between eating.  If you eat breakfast, please do so within one hour of getting up.   Each meal should contain half fruits/vegetables, one quarter protein, and one quarter carbs (no bigger than a computer mouse)  Cut down on sweet beverages. This includes juice, soda, and sweet tea.   Drink at least 1 glass of water with each meal and aim for at least 8 glasses per day  Exercise at least 150 minutes every week.

## 2023-08-31 ENCOUNTER — Ambulatory Visit: Payer: Self-pay | Admitting: Physician Assistant

## 2023-09-15 ENCOUNTER — Other Ambulatory Visit: Payer: Self-pay | Admitting: Cardiology

## 2023-11-01 ENCOUNTER — Other Ambulatory Visit

## 2023-11-08 ENCOUNTER — Ambulatory Visit
Admission: RE | Admit: 2023-11-08 | Discharge: 2023-11-08 | Disposition: A | Discharge status: Home / Self Care | Source: Ambulatory Visit | Attending: Emergency Medicine | Admitting: Emergency Medicine

## 2023-11-08 ENCOUNTER — Encounter: Payer: Self-pay | Admitting: Physician Assistant

## 2023-11-08 DIAGNOSIS — R918 Other nonspecific abnormal finding of lung field: Secondary | ICD-10-CM | POA: Diagnosis not present

## 2023-11-08 NOTE — Telephone Encounter (Signed)
 Please see pt msg and advise

## 2023-11-13 ENCOUNTER — Other Ambulatory Visit (HOSPITAL_COMMUNITY): Payer: Self-pay

## 2023-11-13 ENCOUNTER — Other Ambulatory Visit: Payer: Self-pay | Admitting: Physician Assistant

## 2023-11-14 ENCOUNTER — Other Ambulatory Visit (HOSPITAL_COMMUNITY): Payer: Self-pay

## 2023-11-14 MED ORDER — SACUBITRIL-VALSARTAN 24-26 MG PO TABS
1.0000 | ORAL_TABLET | Freq: Two times a day (BID) | ORAL | 2 refills | Status: AC
  Filled 2023-11-14: qty 180, 90d supply, fill #0
  Filled 2024-02-14: qty 180, 90d supply, fill #1

## 2023-11-15 ENCOUNTER — Other Ambulatory Visit: Payer: Self-pay

## 2023-11-15 ENCOUNTER — Other Ambulatory Visit (HOSPITAL_COMMUNITY): Payer: Self-pay

## 2023-11-27 ENCOUNTER — Other Ambulatory Visit: Payer: Self-pay

## 2023-11-27 ENCOUNTER — Telehealth: Payer: Self-pay | Admitting: Endocrinology

## 2023-11-27 DIAGNOSIS — E119 Type 2 diabetes mellitus without complications: Secondary | ICD-10-CM

## 2023-11-27 MED ORDER — ONETOUCH VERIO VI STRP: ORAL_STRIP | 2 refills | Status: DC

## 2023-11-27 NOTE — Telephone Encounter (Signed)
 MEDICATION: One Touch verio test strips  PHARMACY:   [facility] [address] ([address] [place] - [address] [place]) [place] [address] Phone: [phone]  Fax: [phone]   HAS THE PATIENT CONTACTED THEIR PHARMACY?  Yes.patient states the RX is on hold  LAST REFILL:  @@LASTREFILL @  IS THIS A 90 DAY SUPPLY : Yes  IS PATIENT OUT OF MEDICATION: Yes  IF NOT; HOW MUCH IS LEFT:   LAST APPOINTMENT DATE: @7 /04/2023  NEXT APPOINTMENT DATE:@12 /01/2024  DO WE HAVE YOUR PERMISSION TO LEAVE A DETAILED MESSAGE?: Yes  OTHER COMMENTS:    **Let patient know to contact pharmacy at the end of the day to make sure medication is ready. **  ** Please notify patient to allow 48-72 hours to process**  **Encourage patient to contact the pharmacy for refills or they can request refills through [facility]**

## 2023-11-28 ENCOUNTER — Other Ambulatory Visit: Payer: Self-pay | Admitting: Endocrinology

## 2023-11-28 DIAGNOSIS — E119 Type 2 diabetes mellitus without complications: Secondary | ICD-10-CM

## 2023-11-29 ENCOUNTER — Other Ambulatory Visit: Payer: Self-pay

## 2023-11-29 ENCOUNTER — Ambulatory Visit: Admitting: Physician Assistant

## 2023-11-29 DIAGNOSIS — E119 Type 2 diabetes mellitus without complications: Secondary | ICD-10-CM

## 2023-11-29 MED ORDER — ACCU-CHEK GUIDE TEST VI STRP: ORAL_STRIP | 12 refills | Status: AC

## 2023-11-29 MED ORDER — ACCU-CHEK SOFTCLIX LANCETS MISC: 12 refills | Status: AC

## 2023-11-29 MED ORDER — ACCU-CHEK GUIDE W/DEVICE KIT: PACK | 0 refills | Status: DC

## 2023-11-29 NOTE — Telephone Encounter (Signed)
 New meter needed for insurance coverage. RX sent to pharmacy as requested

## 2023-11-30 ENCOUNTER — Other Ambulatory Visit: Payer: Self-pay | Admitting: Physician Assistant

## 2023-12-04 ENCOUNTER — Encounter: Payer: Self-pay | Admitting: Physician Assistant

## 2023-12-04 ENCOUNTER — Ambulatory Visit: Admitting: Physician Assistant

## 2023-12-04 VITALS — BP 122/68 | HR 69 | Temp 97.3°F | Wt 212.6 lb

## 2023-12-04 DIAGNOSIS — R9389 Abnormal findings on diagnostic imaging of other specified body structures: Secondary | ICD-10-CM

## 2023-12-04 DIAGNOSIS — E119 Type 2 diabetes mellitus without complications: Secondary | ICD-10-CM

## 2023-12-04 DIAGNOSIS — E538 Deficiency of other specified B group vitamins: Secondary | ICD-10-CM

## 2023-12-04 DIAGNOSIS — G629 Polyneuropathy, unspecified: Secondary | ICD-10-CM | POA: Diagnosis not present

## 2023-12-04 MED ORDER — PREGABALIN 25 MG PO CAPS: 25.0000 mg | ORAL_CAPSULE | Freq: Every day | ORAL | 0 refills | Status: DC

## 2023-12-04 NOTE — Progress Notes (Unsigned)
 Patient ID: [name], female    DOB: [birth year], 68 y.o.   MRN: [id number]   Assessment & Plan:  There are no diagnoses linked to this encounter.    Assessment and Plan Assessment & Plan Type 2 diabetes mellitus Type 2 diabetes mellitus is managed by endocrinology with excellent control, as indicated by a current A1c of 5.9. She has been exercising and eating better, contributing to weight loss. She prefers to continue the current regimen due to potential side effects of GLP-1 agonists, including nausea, constipation, and hair loss. - Continue current diabetes management with endocrinology - Encourage exercise and healthy eating  Morbid obesity Morbid obesity is being addressed through lifestyle modifications. She has lost 10 pounds since February through exercise and dietary changes, including consuming a cucumber, pineapple, lemon, and ginger drink that suppresses appetite. - Continue current weight loss efforts with exercise and dietary changes  Peripheral neuropathy She reports numbness and tingling in fingers, worsening at night, possibly due to diabetic neuropathy or carpal tunnel syndrome. Symptoms have been present since the 1990s. Driving may contribute to symptoms. Consideration of Lyrica for neuropathy, with discussion of potential side effects such as grogginess and weight gain. 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 - Prescribe Lyrica at bedtime for neuropathy - Monitor for side effects such as grogginess and weight gain - Consider referral to sports medicine for nerve testing and possible steroid injections if symptoms persist  Recent Chest CT - Secure chat sent to her pulmonologist to review Lung nodule is under surveillance. Awaiting response from pulmonologist regarding recent CT scan results. No immediate concerns reported. - Await response from pulmonologist regarding lung nodule  Vitamin B12  deficiency (on treatment) Vitamin B12 deficiency is being managed with a reduced dose of 1000 mcg. Levels are currently stable. - Continue current B12 supplementation at 1000 mcg      No follow-ups on file.    Subjective:    Chief Complaint  Patient presents with   Medical Management of Chronic Issues    6 month follow up. Also wants to talk about the tingling in her fingers. Started to hurt more. Was diagnosed in the 25's with carpel tunnel. Does wear braces at night when the pain gets worse.     HPI Discussed the use of AI scribe software for clinical note transcription with the patient, who gave verbal consent to proceed.  History of Present Illness [name] is a 68 year old female with type 2 diabetes and morbid obesity who presents for a regular follow-up visit. She is here with a friend.  Her type 2 diabetes is managed by endocrinology, with a current A1c of 5.9. She has lost 10 pounds this year through diet and exercise. She is considering GLP-1 receptor agonists for weight loss but has not started them due to potential side effects.  She is morbidly obese, with a current weight of 212 pounds, down from 222 pounds in February. Her weight loss is attributed to exercise, improved diet, and a homemade drink made of cucumber, pineapple, lemon, and ginger, which she calls 'the sludge' and notes helps suppress her appetite.  She has a history of a lung nodule monitored with a CT scan in September. She has not received follow-up communication regarding the results and is unsure if further action is needed. A urine test in July showed no protein.  She experiences numbness and tingling in her fingers, diagnosed  as carpal tunnel syndrome in the 1990s. The symptoms have worsened recently, occurring primarily at night and affecting all fingers, with pain sometimes radiating from her wrist to her fingers. She has used wrist braces in the past, which provided some relief.  She is  taking B12 supplements, reduced to 1000 mcg from 2000 mcg, due to a B12 deficiency. She occasionally experiences cold toes but denies any numbness or tingling in her toes.  She is concerned about her cardiovascular health, mentioning valve monitoring and a scheduled echocardiogram in November. She maintains her exercise routine to improve stamina in anticipation of potential future interventions.     Past Medical History:  Diagnosis Date   Anxiety    Aortic stenosis, mod to severe    echo 2017   Arthritis    Breast cancer (HCC) 1996   left breast   Carpal tunnel syndrome    Cataract    Chronic diastolic CHF (congestive heart failure) (HCC)    NYHA class II   COVID-19    12/02/21   Diabetes mellitus without complication (HCC)    type 2   Heart murmur    Hyperlipidemia    Hypertension    Left bundle branch block (LBBB)    Low back pain    Neuropathy    NICM (nonischemic cardiomyopathy) (HCC)    EF 30%, cath 05/20/2015 clean coronary.  EF resolved by echo with EF 55%   OSA on CPAP    Pneumonia    Prediabetes    Pulmonary HTN (HCC) 05/2015   Severe with PASP 67/47mmHg - resolved on followup echo   PVC's (premature ventricular contractions) 11/04/2015   Sleep apnea    cpap    Past Surgical History:  Procedure Laterality Date   CARDIAC CATHETERIZATION N/A 05/20/2015   Procedure: Right/Left Heart Cath and Coronary Angiography;  Surgeon: Debby DELENA Sor, MD;  Location: [facility];  Service: Cardiovascular;  Laterality: N/A;   CATARACT EXTRACTION W/ INTRAOCULAR LENS  IMPLANT, BILATERAL     COLONOSCOPY  2018   COLONOSCOPY N/A 08/24/2023   Procedure: COLONOSCOPY;  Surgeon: Leigh Elspeth SQUIBB, MD;  Location: [facility];  Service: Gastroenterology;  Laterality: N/A;   DILATION AND CURETTAGE OF UTERUS     lumpectomy left breast     POLYPECTOMY     UTERINE FIBROID SURGERY      Family History  Problem Relation Age of Onset   Arthritis Mother    Depression Mother     Hearing loss Mother    Hyperlipidemia Mother    Miscarriages / [place] Mother    Colon cancer Mother 80       2010   Colon polyps Mother    Alcohol abuse Father    Cancer Father        PROSTATE   Depression Father    Hypertension Father    Hyperlipidemia Father    Drug abuse Brother    Early death Maternal Grandmother    Cancer Paternal Grandmother    Heart attack Neg Hx    Diabetes Neg Hx    Esophageal cancer Neg Hx    Rectal cancer Neg Hx    Stomach cancer Neg Hx     Social History   Tobacco Use   Smoking status: Former    Current packs/day: 0.00    Types: Cigarettes    Quit date: 06/18/2009    Years since quitting: 14.4   Smokeless tobacco: Never  Vaping Use   Vaping status: Never Used  Substance Use Topics   Alcohol use: Not Currently    Comment: once every 2 months   Drug use: No     Allergies  Allergen Reactions   Jardiance  [Empagliflozin ] Anaphylaxis, Itching, Rash and Other (See Comments)    Yeast infection   Atorvastatin Other (See Comments)    REACTION: mylagias   Other Other (See Comments)    Product containing 3-hydroxy-3-methylglutaryl-coenzyme A reductase inhibitor (product)   Pravastatin Sodium Other (See Comments)    REACTION: myalgias   Sulfa Antibiotics Hives    Review of Systems NEGATIVE UNLESS OTHERWISE INDICATED IN HPI      Objective:     There were no vitals taken for this visit.  Wt Readings from Last 3 Encounters:  08/30/23 211 lb 12.8 oz (96.1 kg)  08/24/23 215 lb (97.5 kg)  07/27/23 215 lb 3.2 oz (97.6 kg)    BP Readings from Last 3 Encounters:  08/30/23 100/65  08/24/23 (!) 160/97  07/27/23 102/64     Physical Exam Vitals reviewed.  Constitutional:      Appearance: Normal appearance. She is well-developed. She is obese.  HENT:     Head: Normocephalic and atraumatic.  Eyes:     Conjunctiva/sclera: Conjunctivae normal.     Pupils: Pupils are equal, round, and reactive to light.  Neck:     Thyroid : No  thyromegaly.     Vascular: No carotid bruit.  Cardiovascular:     Rate and Rhythm: Normal rate and regular rhythm.     Heart sounds: Murmur (previously known) heard.  Pulmonary:     Effort: Pulmonary effort is normal.     Breath sounds: Normal breath sounds.  Musculoskeletal:     Cervical back: Normal range of motion and neck supple.     Right lower leg: Edema present.     Left lower leg: Edema present.     Comments: Non-pitting edema BLE  Lymphadenopathy:     Cervical: No cervical adenopathy.  Skin:    General: Skin is warm and dry.     Capillary Refill: Capillary refill takes less than 2 seconds.     Findings: No rash.  Neurological:     General: No focal deficit present.     Mental Status: She is alert and oriented to person, place, and time.     Cranial Nerves: No cranial nerve deficit.     Coordination: Coordination normal.     Deep Tendon Reflexes: Reflexes normal.  Psychiatric:        Behavior: Behavior normal.             Jaidah Lomax M Folashade Gamboa, PA-C

## 2023-12-04 NOTE — Patient Instructions (Signed)
 Always great to see you! Happy holidays!!  Try the Lyrica at bedtime and message me in a few weeks with updates. This will hopefully help your neuropathy.  Keep up good work with exercise and dietary changes. Proud of you!

## 2023-12-11 ENCOUNTER — Telehealth (HOSPITAL_BASED_OUTPATIENT_CLINIC_OR_DEPARTMENT_OTHER): Payer: Self-pay

## 2023-12-11 NOTE — Telephone Encounter (Signed)
   Pre-operative Risk Assessment    Patient Name: [name]  DOB: 07/11/1955 MRN: [id number]   Date of last office visit: 07/27/2023 with Dr. Verlin Date of next office visit: N/A  Request for Surgical Clearance    Procedure:  Dental cleaning (possibly deep cleaning), radiography and 2 crowns  Date of Surgery:  Clearance TBD                                 Surgeon:  Dr. Lenora Socks Group or Practice Name:  [facility] Phone number:  [phone] Fax number:  [phone]   Type of Clearance Requested:   - Medical  - Pharmacy:  Hold Aspirin  - Not specified   Type of Anesthesia:  Local with epinephrine    Additional requests/questions:  Pt has CHF, please advise.   Signed, Patrcia Hong L   12/11/2023, 4:12 PM

## 2023-12-12 NOTE — Telephone Encounter (Signed)
    Primary Cardiologist: [name], MD  Chart reviewed as part of pre-operative protocol coverage. Simple dental extractions, root canals, crowns are considered low risk procedures per guidelines and generally do not require any specific cardiac clearance. It is also generally accepted that for simple extractions and dental cleanings, there is no need to interrupt blood thinner therapy.   SBE prophylaxis is not required for the patient.  I will route this recommendation to the requesting party via Epic fax function and remove from pre-op pool.  Please call with questions.  Lum LITTIE Louis, NP 12/12/2023, 1:11 PM

## 2023-12-18 ENCOUNTER — Encounter: Payer: Self-pay | Admitting: Radiology

## 2023-12-20 ENCOUNTER — Ambulatory Visit: Payer: Self-pay | Admitting: Emergency Medicine

## 2023-12-20 DIAGNOSIS — R918 Other nonspecific abnormal finding of lung field: Secondary | ICD-10-CM

## 2023-12-27 NOTE — Telephone Encounter (Signed)
 Copied from CRM [id number]. Topic: Clinical - Lab/Test Results >> Dec 25, 2023  2:16 PM Chantha C wrote: Reason for CRM: Patient is returning the office call unsure of the call. Per CAL, there's no triage right now and send a crm. Please advise and call back [phone]. >> Dec 27, 2023 10:58 AM Leila BROCKS wrote: Patient [phone] states just missed a call from the office. Patient states if the office doesn't call before 12 pm, she will not available until 5 pm today. Unable to reach CAL. Please advise and call back.     I called and spoke with the pt and notified of recommendations from Dr Shelah. She verbalized understanding. I placed order for the repeat CT March 2026 with ov with RB after. Nothing further needed.

## 2023-12-27 NOTE — Telephone Encounter (Signed)
 Called the pt and there was no answer- LMTCB

## 2023-12-27 NOTE — Telephone Encounter (Signed)
 Copied from CRM [id number]. Topic: Clinical - Lab/Test Results >> Dec 25, 2023  2:16 PM Chantha C wrote: Reason for CRM: Patient is returning the office call unsure of the call. Per CAL, there's no triage right now and send a crm. Please advise and call back [phone].  [name] HERO contacted Lucien Leila KANDICE Burt Evertt, [facility]    12/22/23  1:57 PM Result Note Atcp x1 left vm to call back CT Super D Chest Wo Contrast Shelah Lamar RAMAN, MD to [facility]    12/20/23  4:58 PM Result Note Recommend a CT scan of the chest without contrast in March 2026 and then follow-up office visit to review results CT Super D Chest Wo Contrast

## 2024-01-02 ENCOUNTER — Ambulatory Visit (HOSPITAL_COMMUNITY)
Admission: RE | Admit: 2024-01-02 | Discharge: 2024-01-02 | Disposition: A | Discharge status: Home / Self Care | Source: Ambulatory Visit | Attending: Cardiology | Admitting: Cardiology

## 2024-01-02 ENCOUNTER — Ambulatory Visit: Payer: Self-pay | Admitting: Cardiovascular Disease

## 2024-01-02 DIAGNOSIS — I350 Nonrheumatic aortic (valve) stenosis: Secondary | ICD-10-CM | POA: Diagnosis present

## 2024-01-02 LAB — ECHOCARDIOGRAM COMPLETE
AR max vel: 0.67 cm2
AV Area VTI: 0.68 cm2
AV Area mean vel: 0.64 cm2
AV Mean grad: 24.5 mmHg
AV Peak grad: 43.6 mmHg
Ao pk vel: 3.3 m/s
Area-P 1/2: 4.13 cm2
S' Lateral: 4.5 cm

## 2024-01-03 ENCOUNTER — Ambulatory Visit: Payer: Self-pay | Admitting: *Deleted

## 2024-01-03 ENCOUNTER — Other Ambulatory Visit: Payer: Self-pay | Admitting: Physician Assistant

## 2024-01-03 MED ORDER — PREGABALIN 50 MG PO CAPS: 50.0000 mg | ORAL_CAPSULE | Freq: Every day | ORAL | 0 refills | Status: DC

## 2024-01-03 NOTE — Telephone Encounter (Signed)
 Called pt and left detailed vm with info of Rx sent to pharmacy and advised to keep PCP updated on how she is doing with new dose. Advised office cb number if needing to return my call.

## 2024-01-03 NOTE — Telephone Encounter (Signed)
 FYI Only or Action Required?: Action required by provider: update on patient condition and if lyrica dose can be increased.  Patient was last seen in primary care on 12/04/2023 by [name], [name], PA-C.  Called Nurse Triage reporting Medication Problem.  Symptoms began  years ago and since taking lyrica 25 mg has helped pain / numbness in hands and fingers small amount.  Interventions attempted: Prescription medications: lyrica 25 mg at hs .  Symptoms are: unchanged. Mild improvement  Triage Disposition: Call PCP When Office is Open  Patient/caregiver understands and will follow disposition?: No, wishes to speak with PCP   Patient requesting call back if PCP will increase dose.           Copied from CRM [id number]. Topic: Clinical - Red Word Triage >> Jan 03, 2024 10:13 AM [name] wrote: Red Word that prompted transfer to Nurse Triage: Patient finished the pregabalin (LYRICA) 25 MG capsule yesterday and she's still having pain/numbness in both hands. She wants to know if Alyssa [name] can increase the dosage? Reason for Disposition  [1] Caller has NON-URGENT medicine question about med that PCP prescribed AND [2] triager unable to answer question  Answer Assessment - Initial Assessment Questions Patient requesting call back if PCP will increase dose of lyrica. Patient also awaiting results of Echo. And advised patient cardiology staff will be contacting her regarding results of any testing.       1. NAME of MEDICINE: What medicine(s) are you calling about?     Pregabalin (Lyrica) 25mg   2. QUESTION: What is your question? (e.g., double dose of medicine, side effect)     Can dose be increased? Patient only takes medication at night . 3. PRESCRIBER: Who prescribed the medicine? Reason: if prescribed by specialist, call should be referred to that group.     PCP  4. SYMPTOMS: Do you have any symptoms? If Yes, ask: What symptoms are you having?  How bad are  the symptoms (e.g., mild, moderate, severe)     Pain and numbness bilateral hands . Medication might be helping some but not as effective as could be.  5. PREGNANCY:  Is there any chance that you are pregnant? When was your last menstrual period?     na  Protocols used: Medication Question Call-A-AH

## 2024-01-09 NOTE — Telephone Encounter (Signed)
Pt returning call for results. Please advise.  

## 2024-01-16 ENCOUNTER — Telehealth: Payer: Self-pay

## 2024-01-16 NOTE — Telephone Encounter (Signed)
 Received chronic conditions verification form to be completed to verify patient has diabetes and cardiovascular disorders. Form completed and faxed back to [phone].

## 2024-01-18 NOTE — Progress Notes (Signed)
 Structural Heart Consult Note  Chief Complaint  Patient presents with   Follow-up    Aortic stenosis    History of Present Illness: 68 yo female with history of chronic combined systolic and diastolic CHF, HTN, hyperlipidemia, peripheral neuropathy, PACs, PVCs, LBBB, sleep apnea, former tobacco abuse, obesity, DM and aortic stenosis who is here today for follow up. I saw her as a new consult in February 2025 for further discussion regarding her aortic stenosis and possible TAVR. She has had reduced LV systolic dating back to 2015 with LVEF around 30% at that time. Cardiac cath in 2015 with no evidence of CAD. She has been followed for moderate aortic stenosis. She was seen in our office in July 2024 by Orren Fabry, PA and was doing well. Echo January 2025 with LVEF=45% with global hypokinesis. Grade 2 diastolic dysfunction. Normal RV function. The aortic valve leaflets are thickened and calcified. Severe low flow/low gradient aortic stenosis with mean gradient 31 mmHg, AVA 0.79 cm2, SVI 32, DI 0.26. She had no symptoms at her first visit here in February 2025. Cardiac CT with aortic valve calcium  score of 751. Annular area of 378 mm2 suitable for a 23 mm Sapien valve. CTA with patent vessels favorable for femoral approach to TAVR. Echo 06/29/23 with LVEF=45% with global hypokinesis. Severe aortic stenosis with mean gradient 32 mmHg, AVA 0.78 cm2, SVI 34, DI 0.25. She was feeling well when I saw her in June 2025. Most recent echo 01/02/24 with LVEF=35-40% with global hypokinesis. Severe low flow/low gradient aortic stenosis with mean gradient 24 mmHg, AVA 0.64 cm2, DI 0.24, SVI 25.   She is here today for follow up. She has some dyspnea on exertion and fatigue. Occasional LE edema. She denies any chest pain, palpitations,orthopnea, PND, dizziness, near syncope or syncope.   She lives in [place], [place]. She works as a midwife for [employer]. She has no active dental issues.   Primary Care Physician:  [name], [name], PA-C Primary Cardiologist: Shlomo Referring Cardiologist: Shlomo  Past Medical History:  Diagnosis Date   Anxiety    Aortic stenosis, mod to severe    echo 2017   Arthritis    Breast cancer (HCC) 1996   left breast   Carpal tunnel syndrome    Cataract    Chronic diastolic CHF (congestive heart failure) (HCC)    NYHA class II   COVID-19    12/02/21   Diabetes mellitus without complication (HCC)    type 2   Heart murmur    Hyperlipidemia    Hypertension    Left bundle branch block (LBBB)    Low back pain    Neuropathy    NICM (nonischemic cardiomyopathy) (HCC)    EF 30%, cath 05/20/2015 clean coronary.  EF resolved by echo with EF 55%   OSA on CPAP    Pneumonia    Prediabetes    Pulmonary HTN (HCC) 05/2015   Severe with PASP 67/91mmHg - resolved on followup echo   PVC's (premature ventricular contractions) 11/04/2015   Sleep apnea    cpap    Past Surgical History:  Procedure Laterality Date   CARDIAC CATHETERIZATION N/A 05/20/2015   Procedure: Right/Left Heart Cath and Coronary Angiography;  Surgeon: Debby DELENA Sor, MD;  Location: [facility];  Service: Cardiovascular;  Laterality: N/A;   CATARACT EXTRACTION W/ INTRAOCULAR LENS  IMPLANT, BILATERAL     COLONOSCOPY  2018   COLONOSCOPY N/A 08/24/2023   Procedure: COLONOSCOPY;  Surgeon: Leigh,  Elspeth SQUIBB, MD;  Location: [facility];  Service: Gastroenterology;  Laterality: N/A;   DILATION AND CURETTAGE OF UTERUS     lumpectomy left breast     POLYPECTOMY     UTERINE FIBROID SURGERY      Current Outpatient Medications  Medication Sig Dispense Refill   Accu-Chek Softclix Lancets lancets USE 1 STRIP TO CHECK BLOOD SUGAR 2-3 TIMES DAILY 100 each 12   acetaminophen  (TYLENOL ) 650 MG CR tablet Take 1,300 mg by mouth as needed for pain.     albuterol  (VENTOLIN  HFA) 108 (90 Base) MCG/ACT inhaler SMARTSIG:2 Puff(s) By Mouth Every 6 Hours PRN     aspirin  81 MG tablet Take 81 mg by mouth daily.      Blood Glucose Monitoring Suppl (ACCU-CHEK GUIDE) w/Device KIT USE TO CHECK BLOOD SUGAR 2-3 TIMES DAILY 1 kit 0   carvedilol  (COREG ) 6.25 MG tablet Take 1 tablet (6.25 mg total) by mouth 2 (two) times daily with a meal. 60 tablet 0   clotrimazole  (LOTRIMIN ) 1 % cream Apply 1 Application topically 2 (two) times daily. 60 g 0   Cyanocobalamin  (VITAMIN B 12 PO) Take by mouth.     diclofenac  Sodium (VOLTAREN ) 1 % GEL Apply 4 g topically 4 (four) times daily. Arthritis 350 g 1   diphenhydrAMINE HCl (BENADRYL ALLERGY PO) Take 1 tablet by mouth as needed.     ezetimibe  (ZETIA ) 10 MG tablet Take 1 tablet (10 mg total) by mouth daily. 30 tablet 0   furosemide  (LASIX ) 40 MG tablet TAKE 1 TO 2 TABLETS BY MOUTH ONCE DAILY AS DIRECTED 180 tablet 1   glucose blood (ACCU-CHEK GUIDE TEST) test strip USE TO CHECK BLOOD SUGAR 2-3 TIMES DAILY 300 each 12   metFORMIN  (GLUCOPHAGE -XR) 500 MG 24 hr tablet 3 tabs qd 270 tablet 3   Multiple Vitamin (MULTIVITAMIN) tablet Take 1 tablet by mouth daily.     pregabalin  (LYRICA ) 50 MG capsule Take 1 capsule (50 mg total) by mouth at bedtime. For nerve pain. 30 capsule 0   rosuvastatin  (CRESTOR ) 5 MG tablet Take 1 tablet by mouth once daily 90 tablet 0   vitamin C (ASCORBIC ACID) 500 MG tablet Take 2,000 mg by mouth as needed.      sacubitril -valsartan  (ENTRESTO ) 24-26 MG Take 1 tablet by mouth 2 (two) times daily. 180 tablet 2   spironolactone  (ALDACTONE ) 25 MG tablet Take 1/2 (one-half) tablet by mouth once daily 45 tablet 3   No current facility-administered medications for this visit.    Allergies  Allergen Reactions   Jardiance  [Empagliflozin ] Anaphylaxis, Itching, Rash and Other (See Comments)    Yeast infection   Atorvastatin Other (See Comments)    REACTION: mylagias   Other Other (See Comments)    Product containing 3-hydroxy-3-methylglutaryl-coenzyme A reductase inhibitor (product)   Pravastatin Sodium Other (See Comments)    REACTION: myalgias   Sulfa  Antibiotics Hives    Social History   Socioeconomic History   Marital status: Significant Other    Spouse name: Not on file   Number of children: 0   Years of education: Not on file   Highest education level: Not on file  Occupational History   Occupation: Truck Hospital Doctor   Occupation: Environmental Manager  Tobacco Use   Smoking status: Former    Current packs/day: 0.00    Types: Cigarettes    Quit date: 06/18/2009    Years since quitting: 14.6   Smokeless tobacco: Never  Vaping Use   Vaping  status: Never Used  Substance and Sexual Activity   Alcohol use: Not Currently    Comment: once every 2 months   Drug use: No   Sexual activity: Not Currently  Other Topics Concern   Not on file  Social History Narrative   ** Merged History Encounter **       Social Drivers of Health   Financial Resource Strain: Low Risk  (06/28/2023)   Overall Financial Resource Strain (CARDIA)    Difficulty of Paying Living Expenses: Not hard at all  Food Insecurity: No Food Insecurity (06/28/2023)   Hunger Vital Sign    Worried About Running Out of Food in the Last Year: Never true    Ran Out of Food in the Last Year: Never true  Transportation Needs: No Transportation Needs (06/28/2023)   PRAPARE - Administrator, Civil Service (Medical): No    Lack of Transportation (Non-Medical): No  Physical Activity: Insufficiently Active (06/28/2023)   Exercise Vital Sign    Days of Exercise per Week: 3 days    Minutes of Exercise per Session: 20 min  Stress: No Stress Concern Present (06/28/2023)   [employer] of Occupational Health - Occupational Stress Questionnaire    Feeling of Stress : Only a little  Social Connections: Moderately Isolated (06/28/2023)   Social Connection and Isolation Panel    Frequency of Communication with Friends and Family: Three times a week    Frequency of Social Gatherings with Friends and Family: Three times a week    Attends Religious Services: More than 4  times per year    Active Member of Clubs or Organizations: No    Attends Banker Meetings: Never    Marital Status: Never married  Intimate Partner Violence: Not At Risk (06/28/2023)   Humiliation, Afraid, Rape, and Kick questionnaire    Fear of Current or Ex-Partner: No    Emotionally Abused: No    Physically Abused: No    Sexually Abused: No    Family History  Problem Relation Age of Onset   Arthritis Mother    Depression Mother    Hearing loss Mother    Hyperlipidemia Mother    Miscarriages / Stillbirths Mother    Colon cancer Mother 71       2010   Colon polyps Mother    Alcohol abuse Father    Cancer Father        PROSTATE   Depression Father    Hypertension Father    Hyperlipidemia Father    Drug abuse Brother    Early death Maternal Grandmother    Cancer Paternal Grandmother    Heart attack Neg Hx    Diabetes Neg Hx    Esophageal cancer Neg Hx    Rectal cancer Neg Hx    Stomach cancer Neg Hx     Review of Systems:  As stated in the HPI and otherwise negative.   BP 100/60   Pulse 70   Ht 5' 4 (1.626 m)   Wt 215 lb (97.5 kg)   BMI 36.90 kg/m   Physical Examination: General: Well developed, well nourished, NAD  HEENT: OP clear, mucus membranes moist  SKIN: warm, dry. No rashes. Neuro: No focal deficits  Musculoskeletal: Muscle strength 5/5 all ext  Psychiatric: Mood and affect normal  Neck: No JVD, no carotid bruits, no thyromegaly, no lymphadenopathy.  Lungs:Clear bilaterally, no wheezes, rhonci, crackles Cardiovascular: Regular rate and rhythm. Harsh systolic murmur.  Abdomen:Soft. Bowel sounds present. Non-tender.  Extremities: No lower extremity edema.   EKG:  EKG is ordered today. The ekg ordered today demonstrates EKG Interpretation Date/Time:  Friday January 19 2024 09:59:29 EST Ventricular Rate:  77 PR Interval:  168 QRS Duration:  134 QT Interval:  402 QTC Calculation: 454 R Axis:   100  Text Interpretation: Sinus  rhythm with frequent Premature ventricular complexes Right atrial enlargement Left ventricular hypertrophy with QRS widening and repolarization abnormality ( Cornell product ) Confirmed by Verlin Bruckner [phone]) on 01/19/2024 10:12:03 AM    Echo 01/02/24:  1. Left ventricular ejection fraction, by estimation, is 35 to 40%. The  left ventricle has moderately decreased function. The left ventricle  demonstrates global hypokinesis. The left ventricular internal cavity size  was mildly dilated. Left ventricular  diastolic parameters are consistent with Grade II diastolic dysfunction  (pseudonormalization). Elevated left ventricular end-diastolic pressure.  The E/e' is 19.   2. Right ventricular systolic function is normal. The right ventricular  size is normal.   3. The mitral valve is grossly normal. Trivial mitral valve  regurgitation.   4. Suspect low flow, low gradient severe AS. The aortic valve is  calcified. Aortic valve regurgitation is trivial. Moderate to severe  aortic valve stenosis. Aortic valve area, by VTI measures 0.68 cm. Aortic  valve mean gradient measures 24.5 mmHg.  Aortic valve Vmax measures 3.30 m/s. Peak gradient 43.6 mmHg, DI 0.24.   5. The inferior vena cava is normal in size with <50% respiratory  variability, suggesting right atrial pressure of 8 mmHg.   Comparison(s): Changes from prior study are noted. 06/29/2023: LVEF 45%,  severe AS - MG 32 mmHg.   Conclusion(s)/Recommendation(s): Compared to the prior study, the LVEF has  decreased, which may be associated with worsening severity of aortic  stenosis.   FINDINGS   Left Ventricle: Left ventricular ejection fraction, by estimation, is 35  to 40%. The left ventricle has moderately decreased function. The left  ventricle demonstrates global hypokinesis. The left ventricular internal  cavity size was mildly dilated.  There is no left ventricular hypertrophy. Abnormal (paradoxical) septal  motion,  consistent with left bundle branch block. Left ventricular  diastolic parameters are consistent with Grade II diastolic dysfunction  (pseudonormalization). Elevated left  ventricular end-diastolic pressure. The E/e' is 69.   Right Ventricle: The right ventricular size is normal. No increase in  right ventricular wall thickness. Right ventricular systolic function is  normal.   Left Atrium: Left atrial size was normal in size.   Right Atrium: Right atrial size was normal in size.   Pericardium: There is no evidence of pericardial effusion.   Mitral Valve: The mitral valve is grossly normal. Trivial mitral valve  regurgitation.   Tricuspid Valve: The tricuspid valve is grossly normal. Tricuspid valve  regurgitation is trivial.   Suspect low flow, low gradient severe AS. The aortic valve is calcified.  Aortic valve regurgitation is trivial. Moderate to severe aortic stenosis  is present.  Pulmonic Valve: The pulmonic valve was normal in structure. Pulmonic valve  regurgitation is not visualized.   Aorta: The aortic root and ascending aorta are structurally normal, with  no evidence of dilitation.   Venous: The inferior vena cava is normal in size with less than 50%  respiratory variability, suggesting right atrial pressure of 8 mmHg.   IAS/Shunts: No atrial level shunt detected by color flow Doppler.     LEFT VENTRICLE  PLAX 2D  LVIDd:         5.60  cm   Diastology  LVIDs:         4.50 cm   LV e' medial:    7.06 cm/s  LV PW:         0.90 cm   LV E/e' medial:  16.9  LV IVS:        0.60 cm   LV e' lateral:   6.22 cm/s  LVOT diam:     1.90 cm   LV E/e' lateral: 19.2  LV SV:         51  LV SV Index:   25  LVOT Area:     2.84 cm     RIGHT VENTRICLE  RV Basal diam:  3.50 cm  RV S prime:     12.83 cm/s  TAPSE (M-mode): 2.4 cm   LEFT ATRIUM             Index        RIGHT ATRIUM           Index  LA diam:        4.80 cm 2.39 cm/m   RA Area:     12.70 cm  LA Vol (A2C):    59.9 ml 29.83 ml/m  RA Volume:   30.10 ml  14.99 ml/m  LA Vol (A4C):   52.7 ml 26.25 ml/m  LA Biplane Vol: 57.5 ml 28.64 ml/m   AORTIC VALVE  AV Area (Vmax):    0.67 cm  AV Area (Vmean):   0.64 cm  AV Area (VTI):     0.68 cm  AV Vmax:           330.00 cm/s  AV Vmean:          230.500 cm/s  AV VTI:            0.744 m  AV Peak Grad:      43.6 mmHg  AV Mean Grad:      24.5 mmHg  LVOT Vmax:         77.80 cm/s  LVOT Vmean:        52.050 cm/s  LVOT VTI:          0.178 m  LVOT/AV VTI ratio: 0.24    AORTA  Ao Root diam: 2.80 cm  Ao Asc diam:  3.00 cm   MITRAL VALVE  MV Area (PHT): 4.13 cm     SHUNTS  MV Decel Time: 184 msec     Systemic VTI:  0.18 m  MV E velocity: 119.50 cm/s  Systemic Diam: 1.90 cm  MV A velocity: 63.75 cm/s  MV E/A ratio:  1.87   Cardiac CT 04/03/23: Aortic Valve: Tri leaflet AV with calcium  score only 751   Aorta: No aneurysm normal arch vessels moderate calcific atherosclerosis   Sino-tubular Junction: 24.3 mm calcified   Ascending Thoracic Aorta: 27.6 mm   Aortic Arch: 23 mm   Descending Thoracic Aorta: 22 mm   Sinus of Valsalva Measurements:   Non-coronary: 26.5 mm   height 18.9  mm   Right - coronary: 24.1 mm   height 19.6 mm   Left -   coronary: 26.3 mm   height 19.5 mm   Coronary Artery Height above Annulus:   Left Main: 14.9 mm above annulus   Right Coronary: 16.9 mm above annulus   Virtual Basal Annulus Measurements:   Maximum / Minimum Diameter: 23.9 mm x 20.2 mm Average diameter 21.9 mm   Perimeter: 69.7 mm   Area: 378 mm2  Coronary Arteries: Sufficient height above annulus for deployment   Optimum Fluoroscopic Angle for Delivery: RAO 16 Caudal 3 degrees   IMPRESSION: 1. Tri leaflet AV with calcium  score only 751   2. Annular area of 378 mm2 suitable for a 23 mm Sapien 3 valve Sinuses small for 26 mm Medtronic valve   3.  Coronary arteries sufficient height above annulus for deployment   4. Optimum  angiographic angle for deployment RAO 16 Caudal 3 degrees   5.  Membranous septal length 8.7 mm  CTA Abd/pelvis March 2025: Cardiovascular: Diffusely thickened and calcified aortic valve consistent with the clinical history of aortic valvular disease. No evidence of aneurysm or dissection. Four vessel arch anatomy. The left vertebral artery arises directly from the aorta. Scattered calcifications throughout the thoracic aorta. Mild cardiomegaly with left ventricular dilatation. Calcifications present along the coronary arteries. No pericardial effusion.   Mediastinum/Nodes: Unremarkable CT appearance of the thyroid  gland. No suspicious mediastinal or hilar adenopathy. No soft tissue mediastinal mass. The thoracic esophagus is unremarkable.   Lungs/Pleura: 1 cm right middle lobe pulmonary nodule again noted. No significant interval change compared to relatively recent prior imaging. Additional scattered small 2-3 mm pulmonary nodules without interval change. No focal airspace infiltrate, pleural effusion or pneumothorax. No pulmonary edema.   Musculoskeletal: No acute fracture or aggressive appearing lytic or blastic osseous lesion.   Review of the MIP images confirms the above findings.   CTA ABDOMEN AND PELVIS FINDINGS   VASCULAR   Aorta: Normal caliber aorta without aneurysm, dissection, vasculitis or significant stenosis. Calcified atherosclerotic plaque throughout.   Celiac: Patent without evidence of aneurysm, dissection, vasculitis or significant stenosis.   SMA: Patent without evidence of aneurysm, dissection, vasculitis or significant stenosis.   Renals: Both renal arteries are patent without evidence of aneurysm, dissection, vasculitis, fibromuscular dysplasia or significant stenosis.   IMA: Patent without evidence of aneurysm, dissection, vasculitis or significant stenosis.   Inflow: Robust inflow vessels. Minimal calcified atherosclerotic plaque. No  significant tortuosity. No stenosis, occlusion or changes of fibromuscular dysplasia. No dissection or aneurysm.   Veins: No obvious venous abnormality within the limitations of this arterial phase study.   Review of the MIP images confirms the above findings.   NON-VASCULAR   Hepatobiliary: No focal liver abnormality is seen. No gallstones, gallbladder wall thickening, or biliary dilatation.   Pancreas: No inflammatory stranding around the pancreas. There is an enhancing nodule abutting or exophytic from the tail of the pancreas measuring 1.7 x 1.3 cm (see image 123 of series 4). This is unchanged in size and appearance compared to the prior study from June of 2022. Greater than 2 years of stability is highly consistent with a benign process. This almost certainly represents a small splenule. No further imaging follow-up is recommended.   Spleen: Normal in size without focal abnormality.   Adrenals/Urinary Tract: Normal adrenal glands. No hydronephrosis, nephrolithiasis or enhancing renal mass. Multiple low-attenuation lesions exophytic from the kidneys consistent with simple cysts. No imaging follow-up is recommended. The ureters and bladder are unremarkable.   Stomach/Bowel: There are a few scattered colonic diverticula but no evidence of active diverticulitis. Normal appendix in the right lower quadrant. No focal bowel wall thickening or evidence of obstruction.   Lymphatic: No suspicious lymphadenopathy.   Reproductive: Globular fibroid uterus with dystrophic calcifications. No adnexal masses.   Other: No abdominal wall hernia or abnormality. No abdominopelvic ascites.   Musculoskeletal: No acute fracture or aggressive appearing lytic or blastic osseous lesion.  Review of the MIP images confirms the above findings.   IMPRESSION: 1. No acute abnormality within the chest, abdomen or pelvis. 2. Thickened and calcified aortic valve consistent with the  clinical history of aortic valvular disease. 3. No evidence of access vessel abnormality or aortic abnormality that would prevent or complicate TAVR. 4. Scattered aortic and coronary artery atherosclerotic vascular plaque. 5. No interval change in the size or appearance of the 1 cm right middle lobe pulmonary nodule. Agree with prior recommendations of pulmonary neoplasm workup with PET-CT as suggested on the CT scan of the chest dated 01/16/2023. 6. Mild cardiomegaly with left ventricular dilatation. 7. Additional ancillary findings as above.  Recent Labs: 02/22/2023: ALT 16 01/19/2024: BUN 17; Creatinine, Ser 0.80; Hemoglobin 13.1; Platelets 322; Potassium 4.5; Sodium 140    Wt Readings from Last 3 Encounters:  01/19/24 215 lb (97.5 kg)  12/04/23 212 lb 9.6 oz (96.4 kg)  08/30/23 211 lb 12.8 oz (96.1 kg)    Assessment and Plan:   1. Severe Aortic Valve Stenosis: She has severe aortic valve stenosis. She is known to have a non-ischemic cardiomyopathy but slight fall in LVEF from echo in May 2025. NYHA class 2 symptoms. I have personally reviewed the echo images. The aortic valve is thickened and calcified with limited leaflet mobility. I think she would benefit from AVR. She would be a candidate for surgical AVR or TAVR but she would prefer TAVR if it is an option.    I have reviewed the natural history of aortic stenosis with the patient and their family members  who are present today. We have discussed the limitations of medical therapy and the poor prognosis associated with symptomatic aortic stenosis. We have reviewed potential treatment options, including palliative medical therapy, conventional surgical aortic valve replacement, and transcatheter aortic valve replacement. We discussed treatment options in the context of the patient's specific comorbid medical conditions.   She would like to proceed with planning for TAVR. I will arrange a right and left heart catheterization at [facility]  01/31/24. Risks and benefits of the cath procedure and the valve procedure are reviewed with the patient. Her CT scans have been completed. After the cath, she will be referred to see one of the CT surgeons on our TAVR team.    Labs/ tests ordered today include:  Orders Placed This Encounter  Procedures   Basic Metabolic Panel (BMET)   CBC w/Diff   EKG 12-Lead   Disposition:   F/U will be arranged with the structural team.   Signed, [name], MD, [facility] 01/21/2024 1:51 PM    [facility] [address] [place], [place], [place]  [address] Phone: [phone]; Fax: [phone]

## 2024-01-18 NOTE — H&P (View-Only) (Signed)
 Structural Heart Consult Note  Chief Complaint  Patient presents with   Follow-up    Aortic stenosis    History of Present Illness: 68 yo female with history of chronic combined systolic and diastolic CHF, HTN, hyperlipidemia, peripheral neuropathy, PACs, PVCs, LBBB, sleep apnea, former tobacco abuse, obesity, DM and aortic stenosis who is here today for follow up. I saw her as a new consult in February 2025 for further discussion regarding her aortic stenosis and possible TAVR. She has had reduced LV systolic dating back to 2015 with LVEF around 30% at that time. Cardiac cath in 2015 with no evidence of CAD. She has been followed for moderate aortic stenosis. She was seen in our office in July 2024 by Orren Fabry, PA and was doing well. Echo January 2025 with LVEF=45% with global hypokinesis. Grade 2 diastolic dysfunction. Normal RV function. The aortic valve leaflets are thickened and calcified. Severe low flow/low gradient aortic stenosis with mean gradient 31 mmHg, AVA 0.79 cm2, SVI 32, DI 0.26. She had no symptoms at her first visit here in February 2025. Cardiac CT with aortic valve calcium  score of 751. Annular area of 378 mm2 suitable for a 23 mm Sapien valve. CTA with patent vessels favorable for femoral approach to TAVR. Echo 06/29/23 with LVEF=45% with global hypokinesis. Severe aortic stenosis with mean gradient 32 mmHg, AVA 0.78 cm2, SVI 34, DI 0.25. She was feeling well when I saw her in June 2025. Most recent echo 01/02/24 with LVEF=35-40% with global hypokinesis. Severe low flow/low gradient aortic stenosis with mean gradient 24 mmHg, AVA 0.64 cm2, DI 0.24, SVI 25.   She is here today for follow up. She has some dyspnea on exertion and fatigue. Occasional LE edema. She denies any chest pain, palpitations,orthopnea, PND, dizziness, near syncope or syncope.   She lives in [place], [place]. She works as a midwife for [employer]. She has no active dental issues.   Primary Care Physician:  [name], [name], PA-C Primary Cardiologist: Shlomo Referring Cardiologist: Shlomo  Past Medical History:  Diagnosis Date   Anxiety    Aortic stenosis, mod to severe    echo 2017   Arthritis    Breast cancer (HCC) 1996   left breast   Carpal tunnel syndrome    Cataract    Chronic diastolic CHF (congestive heart failure) (HCC)    NYHA class II   COVID-19    12/02/21   Diabetes mellitus without complication (HCC)    type 2   Heart murmur    Hyperlipidemia    Hypertension    Left bundle branch block (LBBB)    Low back pain    Neuropathy    NICM (nonischemic cardiomyopathy) (HCC)    EF 30%, cath 05/20/2015 clean coronary.  EF resolved by echo with EF 55%   OSA on CPAP    Pneumonia    Prediabetes    Pulmonary HTN (HCC) 05/2015   Severe with PASP 67/91mmHg - resolved on followup echo   PVC's (premature ventricular contractions) 11/04/2015   Sleep apnea    cpap    Past Surgical History:  Procedure Laterality Date   CARDIAC CATHETERIZATION N/A 05/20/2015   Procedure: Right/Left Heart Cath and Coronary Angiography;  Surgeon: Debby DELENA Sor, MD;  Location: [facility];  Service: Cardiovascular;  Laterality: N/A;   CATARACT EXTRACTION W/ INTRAOCULAR LENS  IMPLANT, BILATERAL     COLONOSCOPY  2018   COLONOSCOPY N/A 08/24/2023   Procedure: COLONOSCOPY;  Surgeon: Leigh,  Elspeth SQUIBB, MD;  Location: [facility];  Service: Gastroenterology;  Laterality: N/A;   DILATION AND CURETTAGE OF UTERUS     lumpectomy left breast     POLYPECTOMY     UTERINE FIBROID SURGERY      Current Outpatient Medications  Medication Sig Dispense Refill   Accu-Chek Softclix Lancets lancets USE 1 STRIP TO CHECK BLOOD SUGAR 2-3 TIMES DAILY 100 each 12   acetaminophen  (TYLENOL ) 650 MG CR tablet Take 1,300 mg by mouth as needed for pain.     albuterol  (VENTOLIN  HFA) 108 (90 Base) MCG/ACT inhaler SMARTSIG:2 Puff(s) By Mouth Every 6 Hours PRN     aspirin  81 MG tablet Take 81 mg by mouth daily.      Blood Glucose Monitoring Suppl (ACCU-CHEK GUIDE) w/Device KIT USE TO CHECK BLOOD SUGAR 2-3 TIMES DAILY 1 kit 0   carvedilol  (COREG ) 6.25 MG tablet Take 1 tablet (6.25 mg total) by mouth 2 (two) times daily with a meal. 60 tablet 0   clotrimazole  (LOTRIMIN ) 1 % cream Apply 1 Application topically 2 (two) times daily. 60 g 0   Cyanocobalamin  (VITAMIN B 12 PO) Take by mouth.     diclofenac  Sodium (VOLTAREN ) 1 % GEL Apply 4 g topically 4 (four) times daily. Arthritis 350 g 1   diphenhydrAMINE HCl (BENADRYL ALLERGY PO) Take 1 tablet by mouth as needed.     ezetimibe  (ZETIA ) 10 MG tablet Take 1 tablet (10 mg total) by mouth daily. 30 tablet 0   furosemide  (LASIX ) 40 MG tablet TAKE 1 TO 2 TABLETS BY MOUTH ONCE DAILY AS DIRECTED 180 tablet 1   glucose blood (ACCU-CHEK GUIDE TEST) test strip USE TO CHECK BLOOD SUGAR 2-3 TIMES DAILY 300 each 12   metFORMIN  (GLUCOPHAGE -XR) 500 MG 24 hr tablet 3 tabs qd 270 tablet 3   Multiple Vitamin (MULTIVITAMIN) tablet Take 1 tablet by mouth daily.     pregabalin  (LYRICA ) 50 MG capsule Take 1 capsule (50 mg total) by mouth at bedtime. For nerve pain. 30 capsule 0   rosuvastatin  (CRESTOR ) 5 MG tablet Take 1 tablet by mouth once daily 90 tablet 0   vitamin C (ASCORBIC ACID) 500 MG tablet Take 2,000 mg by mouth as needed.      sacubitril -valsartan  (ENTRESTO ) 24-26 MG Take 1 tablet by mouth 2 (two) times daily. 180 tablet 2   spironolactone  (ALDACTONE ) 25 MG tablet Take 1/2 (one-half) tablet by mouth once daily 45 tablet 3   No current facility-administered medications for this visit.    Allergies  Allergen Reactions   Jardiance  [Empagliflozin ] Anaphylaxis, Itching, Rash and Other (See Comments)    Yeast infection   Atorvastatin Other (See Comments)    REACTION: mylagias   Other Other (See Comments)    Product containing 3-hydroxy-3-methylglutaryl-coenzyme A reductase inhibitor (product)   Pravastatin Sodium Other (See Comments)    REACTION: myalgias   Sulfa  Antibiotics Hives    Social History   Socioeconomic History   Marital status: Significant Other    Spouse name: Not on file   Number of children: 0   Years of education: Not on file   Highest education level: Not on file  Occupational History   Occupation: Truck Hospital Doctor   Occupation: Environmental Manager  Tobacco Use   Smoking status: Former    Current packs/day: 0.00    Types: Cigarettes    Quit date: 06/18/2009    Years since quitting: 14.6   Smokeless tobacco: Never  Vaping Use   Vaping  status: Never Used  Substance and Sexual Activity   Alcohol use: Not Currently    Comment: once every 2 months   Drug use: No   Sexual activity: Not Currently  Other Topics Concern   Not on file  Social History Narrative   ** Merged History Encounter **       Social Drivers of Health   Financial Resource Strain: Low Risk  (06/28/2023)   Overall Financial Resource Strain (CARDIA)    Difficulty of Paying Living Expenses: Not hard at all  Food Insecurity: No Food Insecurity (06/28/2023)   Hunger Vital Sign    Worried About Running Out of Food in the Last Year: Never true    Ran Out of Food in the Last Year: Never true  Transportation Needs: No Transportation Needs (06/28/2023)   PRAPARE - Administrator, Civil Service (Medical): No    Lack of Transportation (Non-Medical): No  Physical Activity: Insufficiently Active (06/28/2023)   Exercise Vital Sign    Days of Exercise per Week: 3 days    Minutes of Exercise per Session: 20 min  Stress: No Stress Concern Present (06/28/2023)   [employer] of Occupational Health - Occupational Stress Questionnaire    Feeling of Stress : Only a little  Social Connections: Moderately Isolated (06/28/2023)   Social Connection and Isolation Panel    Frequency of Communication with Friends and Family: Three times a week    Frequency of Social Gatherings with Friends and Family: Three times a week    Attends Religious Services: More than 4  times per year    Active Member of Clubs or Organizations: No    Attends Banker Meetings: Never    Marital Status: Never married  Intimate Partner Violence: Not At Risk (06/28/2023)   Humiliation, Afraid, Rape, and Kick questionnaire    Fear of Current or Ex-Partner: No    Emotionally Abused: No    Physically Abused: No    Sexually Abused: No    Family History  Problem Relation Age of Onset   Arthritis Mother    Depression Mother    Hearing loss Mother    Hyperlipidemia Mother    Miscarriages / Stillbirths Mother    Colon cancer Mother 71       2010   Colon polyps Mother    Alcohol abuse Father    Cancer Father        PROSTATE   Depression Father    Hypertension Father    Hyperlipidemia Father    Drug abuse Brother    Early death Maternal Grandmother    Cancer Paternal Grandmother    Heart attack Neg Hx    Diabetes Neg Hx    Esophageal cancer Neg Hx    Rectal cancer Neg Hx    Stomach cancer Neg Hx     Review of Systems:  As stated in the HPI and otherwise negative.   BP 100/60   Pulse 70   Ht 5' 4 (1.626 m)   Wt 215 lb (97.5 kg)   BMI 36.90 kg/m   Physical Examination: General: Well developed, well nourished, NAD  HEENT: OP clear, mucus membranes moist  SKIN: warm, dry. No rashes. Neuro: No focal deficits  Musculoskeletal: Muscle strength 5/5 all ext  Psychiatric: Mood and affect normal  Neck: No JVD, no carotid bruits, no thyromegaly, no lymphadenopathy.  Lungs:Clear bilaterally, no wheezes, rhonci, crackles Cardiovascular: Regular rate and rhythm. Harsh systolic murmur.  Abdomen:Soft. Bowel sounds present. Non-tender.  Extremities: No lower extremity edema.   EKG:  EKG is ordered today. The ekg ordered today demonstrates EKG Interpretation Date/Time:  Friday January 19 2024 09:59:29 EST Ventricular Rate:  77 PR Interval:  168 QRS Duration:  134 QT Interval:  402 QTC Calculation: 454 R Axis:   100  Text Interpretation: Sinus  rhythm with frequent Premature ventricular complexes Right atrial enlargement Left ventricular hypertrophy with QRS widening and repolarization abnormality ( Cornell product ) Confirmed by Verlin Bruckner [phone]) on 01/19/2024 10:12:03 AM    Echo 01/02/24:  1. Left ventricular ejection fraction, by estimation, is 35 to 40%. The  left ventricle has moderately decreased function. The left ventricle  demonstrates global hypokinesis. The left ventricular internal cavity size  was mildly dilated. Left ventricular  diastolic parameters are consistent with Grade II diastolic dysfunction  (pseudonormalization). Elevated left ventricular end-diastolic pressure.  The E/e' is 19.   2. Right ventricular systolic function is normal. The right ventricular  size is normal.   3. The mitral valve is grossly normal. Trivial mitral valve  regurgitation.   4. Suspect low flow, low gradient severe AS. The aortic valve is  calcified. Aortic valve regurgitation is trivial. Moderate to severe  aortic valve stenosis. Aortic valve area, by VTI measures 0.68 cm. Aortic  valve mean gradient measures 24.5 mmHg.  Aortic valve Vmax measures 3.30 m/s. Peak gradient 43.6 mmHg, DI 0.24.   5. The inferior vena cava is normal in size with <50% respiratory  variability, suggesting right atrial pressure of 8 mmHg.   Comparison(s): Changes from prior study are noted. 06/29/2023: LVEF 45%,  severe AS - MG 32 mmHg.   Conclusion(s)/Recommendation(s): Compared to the prior study, the LVEF has  decreased, which may be associated with worsening severity of aortic  stenosis.   FINDINGS   Left Ventricle: Left ventricular ejection fraction, by estimation, is 35  to 40%. The left ventricle has moderately decreased function. The left  ventricle demonstrates global hypokinesis. The left ventricular internal  cavity size was mildly dilated.  There is no left ventricular hypertrophy. Abnormal (paradoxical) septal  motion,  consistent with left bundle branch block. Left ventricular  diastolic parameters are consistent with Grade II diastolic dysfunction  (pseudonormalization). Elevated left  ventricular end-diastolic pressure. The E/e' is 69.   Right Ventricle: The right ventricular size is normal. No increase in  right ventricular wall thickness. Right ventricular systolic function is  normal.   Left Atrium: Left atrial size was normal in size.   Right Atrium: Right atrial size was normal in size.   Pericardium: There is no evidence of pericardial effusion.   Mitral Valve: The mitral valve is grossly normal. Trivial mitral valve  regurgitation.   Tricuspid Valve: The tricuspid valve is grossly normal. Tricuspid valve  regurgitation is trivial.   Suspect low flow, low gradient severe AS. The aortic valve is calcified.  Aortic valve regurgitation is trivial. Moderate to severe aortic stenosis  is present.  Pulmonic Valve: The pulmonic valve was normal in structure. Pulmonic valve  regurgitation is not visualized.   Aorta: The aortic root and ascending aorta are structurally normal, with  no evidence of dilitation.   Venous: The inferior vena cava is normal in size with less than 50%  respiratory variability, suggesting right atrial pressure of 8 mmHg.   IAS/Shunts: No atrial level shunt detected by color flow Doppler.     LEFT VENTRICLE  PLAX 2D  LVIDd:         5.60  cm   Diastology  LVIDs:         4.50 cm   LV e' medial:    7.06 cm/s  LV PW:         0.90 cm   LV E/e' medial:  16.9  LV IVS:        0.60 cm   LV e' lateral:   6.22 cm/s  LVOT diam:     1.90 cm   LV E/e' lateral: 19.2  LV SV:         51  LV SV Index:   25  LVOT Area:     2.84 cm     RIGHT VENTRICLE  RV Basal diam:  3.50 cm  RV S prime:     12.83 cm/s  TAPSE (M-mode): 2.4 cm   LEFT ATRIUM             Index        RIGHT ATRIUM           Index  LA diam:        4.80 cm 2.39 cm/m   RA Area:     12.70 cm  LA Vol (A2C):    59.9 ml 29.83 ml/m  RA Volume:   30.10 ml  14.99 ml/m  LA Vol (A4C):   52.7 ml 26.25 ml/m  LA Biplane Vol: 57.5 ml 28.64 ml/m   AORTIC VALVE  AV Area (Vmax):    0.67 cm  AV Area (Vmean):   0.64 cm  AV Area (VTI):     0.68 cm  AV Vmax:           330.00 cm/s  AV Vmean:          230.500 cm/s  AV VTI:            0.744 m  AV Peak Grad:      43.6 mmHg  AV Mean Grad:      24.5 mmHg  LVOT Vmax:         77.80 cm/s  LVOT Vmean:        52.050 cm/s  LVOT VTI:          0.178 m  LVOT/AV VTI ratio: 0.24    AORTA  Ao Root diam: 2.80 cm  Ao Asc diam:  3.00 cm   MITRAL VALVE  MV Area (PHT): 4.13 cm     SHUNTS  MV Decel Time: 184 msec     Systemic VTI:  0.18 m  MV E velocity: 119.50 cm/s  Systemic Diam: 1.90 cm  MV A velocity: 63.75 cm/s  MV E/A ratio:  1.87   Cardiac CT 04/03/23: Aortic Valve: Tri leaflet AV with calcium  score only 751   Aorta: No aneurysm normal arch vessels moderate calcific atherosclerosis   Sino-tubular Junction: 24.3 mm calcified   Ascending Thoracic Aorta: 27.6 mm   Aortic Arch: 23 mm   Descending Thoracic Aorta: 22 mm   Sinus of Valsalva Measurements:   Non-coronary: 26.5 mm   height 18.9  mm   Right - coronary: 24.1 mm   height 19.6 mm   Left -   coronary: 26.3 mm   height 19.5 mm   Coronary Artery Height above Annulus:   Left Main: 14.9 mm above annulus   Right Coronary: 16.9 mm above annulus   Virtual Basal Annulus Measurements:   Maximum / Minimum Diameter: 23.9 mm x 20.2 mm Average diameter 21.9 mm   Perimeter: 69.7 mm   Area: 378 mm2  Coronary Arteries: Sufficient height above annulus for deployment   Optimum Fluoroscopic Angle for Delivery: RAO 16 Caudal 3 degrees   IMPRESSION: 1. Tri leaflet AV with calcium  score only 751   2. Annular area of 378 mm2 suitable for a 23 mm Sapien 3 valve Sinuses small for 26 mm Medtronic valve   3.  Coronary arteries sufficient height above annulus for deployment   4. Optimum  angiographic angle for deployment RAO 16 Caudal 3 degrees   5.  Membranous septal length 8.7 mm  CTA Abd/pelvis March 2025: Cardiovascular: Diffusely thickened and calcified aortic valve consistent with the clinical history of aortic valvular disease. No evidence of aneurysm or dissection. Four vessel arch anatomy. The left vertebral artery arises directly from the aorta. Scattered calcifications throughout the thoracic aorta. Mild cardiomegaly with left ventricular dilatation. Calcifications present along the coronary arteries. No pericardial effusion.   Mediastinum/Nodes: Unremarkable CT appearance of the thyroid  gland. No suspicious mediastinal or hilar adenopathy. No soft tissue mediastinal mass. The thoracic esophagus is unremarkable.   Lungs/Pleura: 1 cm right middle lobe pulmonary nodule again noted. No significant interval change compared to relatively recent prior imaging. Additional scattered small 2-3 mm pulmonary nodules without interval change. No focal airspace infiltrate, pleural effusion or pneumothorax. No pulmonary edema.   Musculoskeletal: No acute fracture or aggressive appearing lytic or blastic osseous lesion.   Review of the MIP images confirms the above findings.   CTA ABDOMEN AND PELVIS FINDINGS   VASCULAR   Aorta: Normal caliber aorta without aneurysm, dissection, vasculitis or significant stenosis. Calcified atherosclerotic plaque throughout.   Celiac: Patent without evidence of aneurysm, dissection, vasculitis or significant stenosis.   SMA: Patent without evidence of aneurysm, dissection, vasculitis or significant stenosis.   Renals: Both renal arteries are patent without evidence of aneurysm, dissection, vasculitis, fibromuscular dysplasia or significant stenosis.   IMA: Patent without evidence of aneurysm, dissection, vasculitis or significant stenosis.   Inflow: Robust inflow vessels. Minimal calcified atherosclerotic plaque. No  significant tortuosity. No stenosis, occlusion or changes of fibromuscular dysplasia. No dissection or aneurysm.   Veins: No obvious venous abnormality within the limitations of this arterial phase study.   Review of the MIP images confirms the above findings.   NON-VASCULAR   Hepatobiliary: No focal liver abnormality is seen. No gallstones, gallbladder wall thickening, or biliary dilatation.   Pancreas: No inflammatory stranding around the pancreas. There is an enhancing nodule abutting or exophytic from the tail of the pancreas measuring 1.7 x 1.3 cm (see image 123 of series 4). This is unchanged in size and appearance compared to the prior study from June of 2022. Greater than 2 years of stability is highly consistent with a benign process. This almost certainly represents a small splenule. No further imaging follow-up is recommended.   Spleen: Normal in size without focal abnormality.   Adrenals/Urinary Tract: Normal adrenal glands. No hydronephrosis, nephrolithiasis or enhancing renal mass. Multiple low-attenuation lesions exophytic from the kidneys consistent with simple cysts. No imaging follow-up is recommended. The ureters and bladder are unremarkable.   Stomach/Bowel: There are a few scattered colonic diverticula but no evidence of active diverticulitis. Normal appendix in the right lower quadrant. No focal bowel wall thickening or evidence of obstruction.   Lymphatic: No suspicious lymphadenopathy.   Reproductive: Globular fibroid uterus with dystrophic calcifications. No adnexal masses.   Other: No abdominal wall hernia or abnormality. No abdominopelvic ascites.   Musculoskeletal: No acute fracture or aggressive appearing lytic or blastic osseous lesion.  Review of the MIP images confirms the above findings.   IMPRESSION: 1. No acute abnormality within the chest, abdomen or pelvis. 2. Thickened and calcified aortic valve consistent with the  clinical history of aortic valvular disease. 3. No evidence of access vessel abnormality or aortic abnormality that would prevent or complicate TAVR. 4. Scattered aortic and coronary artery atherosclerotic vascular plaque. 5. No interval change in the size or appearance of the 1 cm right middle lobe pulmonary nodule. Agree with prior recommendations of pulmonary neoplasm workup with PET-CT as suggested on the CT scan of the chest dated 01/16/2023. 6. Mild cardiomegaly with left ventricular dilatation. 7. Additional ancillary findings as above.  Recent Labs: 02/22/2023: ALT 16 01/19/2024: BUN 17; Creatinine, Ser 0.80; Hemoglobin 13.1; Platelets 322; Potassium 4.5; Sodium 140    Wt Readings from Last 3 Encounters:  01/19/24 215 lb (97.5 kg)  12/04/23 212 lb 9.6 oz (96.4 kg)  08/30/23 211 lb 12.8 oz (96.1 kg)    Assessment and Plan:   1. Severe Aortic Valve Stenosis: She has severe aortic valve stenosis. She is known to have a non-ischemic cardiomyopathy but slight fall in LVEF from echo in May 2025. NYHA class 2 symptoms. I have personally reviewed the echo images. The aortic valve is thickened and calcified with limited leaflet mobility. I think she would benefit from AVR. She would be a candidate for surgical AVR or TAVR but she would prefer TAVR if it is an option.    I have reviewed the natural history of aortic stenosis with the patient and their family members  who are present today. We have discussed the limitations of medical therapy and the poor prognosis associated with symptomatic aortic stenosis. We have reviewed potential treatment options, including palliative medical therapy, conventional surgical aortic valve replacement, and transcatheter aortic valve replacement. We discussed treatment options in the context of the patient's specific comorbid medical conditions.   She would like to proceed with planning for TAVR. I will arrange a right and left heart catheterization at [facility]  01/31/24. Risks and benefits of the cath procedure and the valve procedure are reviewed with the patient. Her CT scans have been completed. After the cath, she will be referred to see one of the CT surgeons on our TAVR team.    Labs/ tests ordered today include:  Orders Placed This Encounter  Procedures   Basic Metabolic Panel (BMET)   CBC w/Diff   EKG 12-Lead   Disposition:   F/U will be arranged with the structural team.   Signed, [name], MD, [facility] 01/21/2024 1:51 PM    [facility] [address] [place], [place], [place]  [address] Phone: [phone]; Fax: [phone]

## 2024-01-19 ENCOUNTER — Ambulatory Visit: Attending: Cardiovascular Disease | Admitting: Cardiovascular Disease

## 2024-01-19 VITALS — BP 100/60 | HR 70 | Ht 64.0 in | Wt 215.0 lb

## 2024-01-19 DIAGNOSIS — I350 Nonrheumatic aortic (valve) stenosis: Secondary | ICD-10-CM | POA: Diagnosis not present

## 2024-01-19 LAB — BASIC METABOLIC PANEL WITH GFR
BUN/Creatinine Ratio: 21 (ref 12–28)
BUN: 17 mg/dL (ref 8–27)
CO2: 22 mmol/L (ref 20–29)
Calcium: 10.9 mg/dL — ABNORMAL HIGH (ref 8.7–10.3)
Chloride: 103 mmol/L (ref 96–106)
Creatinine, Ser: 0.8 mg/dL (ref 0.57–1.00)
Glucose: 99 mg/dL (ref 70–99)
Potassium: 4.5 mmol/L (ref 3.5–5.2)
Sodium: 140 mmol/L (ref 134–144)
eGFR: 80 mL/min/1.73 (ref >59)

## 2024-01-19 LAB — CBC WITH DIFFERENTIAL/PLATELET
Basophils Absolute: 0 x10E3/uL (ref 0.0–0.2)
Basos: 1 %
EOS (ABSOLUTE): 0.2 x10E3/uL (ref 0.0–0.4)
Eos: 2 %
Hematocrit: 39.7 % (ref 34.0–46.6)
Hemoglobin: 13.1 g/dL (ref 11.1–15.9)
Immature Grans (Abs): 0 x10E3/uL (ref 0.0–0.1)
Immature Granulocytes: 0 %
Lymphocytes Absolute: 2.5 x10E3/uL (ref 0.7–3.1)
Lymphs: 31 %
MCH: 31.1 pg (ref 26.6–33.0)
MCHC: 33 g/dL (ref 31.5–35.7)
MCV: 94 fL (ref 79–97)
Monocytes Absolute: 0.6 x10E3/uL (ref 0.1–0.9)
Monocytes: 8 %
Neutrophils Absolute: 4.9 x10E3/uL (ref 1.4–7.0)
Neutrophils: 58 %
Platelets: 322 x10E3/uL (ref 150–450)
RBC: 4.21 x10E6/uL (ref 3.77–5.28)
RDW: 13 % (ref 11.7–15.4)
WBC: 8.2 x10E3/uL (ref 3.4–10.8)

## 2024-01-19 NOTE — Progress Notes (Signed)
 Pre Surgical Assessment: 5 M Walk Test  66M=16.62ft  5 Meter Walk Test- trial 1: 5.89 seconds 5 Meter Walk Test- trial 2: 4.80 seconds 5 Meter Walk Test- trial 3: 5.03 seconds 5 Meter Walk Test Average: 5.24 seconds

## 2024-01-19 NOTE — Patient Instructions (Signed)
 Medication Instructions:   Your physician recommends that you continue on your current medications as directed. Please refer to the Current Medication list given to you today.   *If you need a refill on your cardiac medications before your next appointment, please call your pharmacy*  Lab Work:  TODAY!!!! BMET/CBC  If you have labs (blood work) drawn today and your tests are completely normal, you will receive your results only by: MyChart Message (if you have MyChart) OR A paper copy in the mail If you have any lab test that is abnormal or we need to change your treatment, we will call you to review the results.  Testing/Procedures:        Cardiac/Peripheral Catheterization   You are scheduled for a Cardiac Catheterization on Wednesday, December 17 with Dr. [name].  1. Please arrive at the [facility] (Main Entrance A) at [facility]: [address] [place], [place] [address] at 11:00 AM (This time is 2 hour(s) before your procedure to ensure your preparation).   Free valet parking service is available. You will check in at ADMITTING. The support person will be asked to wait in the waiting room.  It is OK to have someone drop you off and come back when you are ready to be discharged.        Special note: Every effort is made to have your procedure done on time. Please understand that emergencies sometimes delay scheduled procedures.  2. Diet: Nothing to eat after midnight.  3. Hydration:You need to be well hydrated before your procedure. On December 17, you may drink approved liquids (see below) until 2 hours before the procedure, with 16 oz of water as your last intake.   List of approved liquids water, clear juice, clear tea, black coffee, fruit juices, non-citric and without pulp, carbonated beverages, Gatorade, Kool -Aid, plain Jello-O and plain ice popsicles.  4. Labs: You will need to have blood drawn on Friday, December 5 at [facility] (1st Floor), [address] [place], [place] [address]. You do not need to be fasting.  5. Medication instructions in preparation for your procedure:   Contrast Allergy: No  Do not take Diabetes Med Glucophage  (Metformin ) on the day of the procedure and HOLD 48 HOURS AFTER THE PROCEDURE.  On the morning of your procedure, take Aspirin  81 mg and any morning medicines NOT listed above.  You may use sips of water.  6. Plan to go home the same day, you will only stay overnight if medically necessary. 7. You MUST have a responsible adult to drive you home. 8. An adult MUST be with you the first 24 hours after you arrive home. 9. Bring a current list of your medications, and the last time and date medication taken. 10. Bring ID and current insurance cards. 11.Please wear clothes that are easy to get on and off and wear slip-on shoes.  Thank you for allowing us  to care for you!   -- [facility]   Follow-Up: At [facility], you and your health needs are our priority.  As part of our continuing mission to provide you with exceptional heart care, our providers are all part of one team.  This team includes your primary Cardiologist (physician) and Advanced Practice Providers or APPs (Physician Assistants and Nurse Practitioners) who all work together to provide you with the care you need, when you need it.  Your next  appointment:      Provider:   [name], MD   We recommend signing up for the patient portal called MyChart.  Sign up information is provided on this After Visit Summary.  MyChart is used to connect with patients for Virtual Visits (Telemedicine).  Patients are able to view lab/test results, encounter notes, upcoming appointments, etc.  Non-urgent messages can be sent to your provider as well.   To learn more about what you can do with MyChart, go to [email or web address].

## 2024-01-19 NOTE — Progress Notes (Unsigned)
 [name]

## 2024-01-22 ENCOUNTER — Ambulatory Visit: Payer: Self-pay | Admitting: Cardiovascular Disease

## 2024-01-26 ENCOUNTER — Encounter: Payer: Self-pay | Admitting: Endocrinology

## 2024-01-26 ENCOUNTER — Ambulatory Visit: Admitting: Endocrinology

## 2024-01-26 ENCOUNTER — Ambulatory Visit: Payer: Self-pay | Admitting: Endocrinology

## 2024-01-26 VITALS — BP 132/80 | HR 75 | Resp 16 | Ht 64.0 in | Wt 214.8 lb

## 2024-01-26 DIAGNOSIS — Z7984 Long term (current) use of oral hypoglycemic drugs: Secondary | ICD-10-CM | POA: Diagnosis not present

## 2024-01-26 DIAGNOSIS — E119 Type 2 diabetes mellitus without complications: Secondary | ICD-10-CM

## 2024-01-26 DIAGNOSIS — Z7985 Long-term (current) use of injectable non-insulin antidiabetic drugs: Secondary | ICD-10-CM

## 2024-01-26 LAB — POCT GLYCOSYLATED HEMOGLOBIN (HGB A1C): Hemoglobin A1C: 5.9 % — AB (ref 4.0–5.6)

## 2024-01-26 MED ORDER — OZEMPIC (0.25 OR 0.5 MG/DOSE) 2 MG/3ML ~~LOC~~ SOPN: PEN_INJECTOR | SUBCUTANEOUS | 11 refills | Status: AC

## 2024-01-26 MED ORDER — METFORMIN HCL ER 500 MG PO TB24: ORAL_TABLET | ORAL | 3 refills | Status: AC

## 2024-01-26 NOTE — Patient Instructions (Signed)
 Same metformin .  Start ozempic 0.25 mg weekly for 4 weeks and increase to 0.5 mg weekly.

## 2024-01-26 NOTE — Progress Notes (Signed)
 Outpatient Endocrinology Note [name]  01/26/2024  Patient's Name: [name]    DOB: 06-07-1955    MRN: [id number]                                                    REASON OF VISIT: Follow up for type 2 diabetes mellitus  PCP: [name], [name] [name], [name]  HISTORY OF PRESENT ILLNESS:   [name] is a 68 y.o. old female with past medical history listed below, is here for follow up for type 2 diabetes mellitus.   Pertinent Diabetes History: Patient was previously seen by Dr. Von and was last time seen in April 2024.  Patient was diagnosed with type 2 diabetes mellitus in August 2019, hemoglobin A1c was 6.8% at the time of diagnosis.  Patient has controlled type 2 diabetes mellitus.  Chronic Diabetes Complications : Retinopathy: no. Last ophthalmology exam was done on annually, following with ophthalmology regularly.  Nephropathy: no, on ACE/ARB / valsatan Peripheral neuropathy: no Coronary artery disease: no. Has CHF.  Stroke: no  Relevant comorbidities and cardiovascular risk factors: Obesity: yes Body mass index is 36.87 kg/m.  Hypertension: Yes  Hyperlipidemia : Yes, on statin   Current / Home Diabetic regimen includes:  Metformin  ER 500 mg 3 tablets daily.  Usually with different meals.  Prior diabetic medications: Vaginitis from Jardiance .          Glycemic data:   [employer] from November 28 to January 26, 2024 reviewed.  Average blood sugar 117.  She has been checking blood sugar 2-3 times a day at different times of the day.  Mostly on the blood sugar in the range of 102- 120 range, rarely up to 130s.  No hypoglycemia.  No hyperglycemia.    Hypoglycemia: Patient has no hypoglycemic episodes. Patient has hypoglycemia awareness.  Factors modifying glucose control: 1.  Diabetic diet assessment: 3 meals a day.  2.  Staying active or exercising: Not able to exercise due to knee pain.  She tries to walk.  3.  Medication  compliance: compliant all of the time.  Interval history  Hemoglobin A1c 5.9% today.  Patient has no personal family history of pancreatitis, medullary thyroid  carcinoma, MEN 2 syndrome.  She wants to try GLP-1 receptor agonist to help with weight loss and to have cardiovascular benefit.  She has complaints of occasional numbness and tingling of the hand, denies numbness and ting of the feet.  Discussed further evaluation with primary care provider for nerve testing.  Discussed that usually diabetes related neuropathy start from the feet.  No vision problem.  No other complaints today.   REVIEW OF SYSTEMS As per history of present illness.   PAST MEDICAL HISTORY: Past Medical History:  Diagnosis Date   Anxiety    Aortic stenosis, mod to severe    echo 2017   Arthritis    Breast cancer (HCC) 1996   left breast   Carpal tunnel syndrome    Cataract    Chronic diastolic CHF (congestive heart failure) (HCC)    NYHA class II   COVID-19    12/02/21   Diabetes mellitus without complication (HCC)    type 2   Heart murmur    Hyperlipidemia    Hypertension    Left bundle branch block (LBBB)  Low back pain    Neuropathy    NICM (nonischemic cardiomyopathy) (HCC)    EF 30%, cath 05/20/2015 clean coronary.  EF resolved by echo with EF 55%   OSA on CPAP    Pneumonia    Prediabetes    Pulmonary HTN (HCC) 05/2015   Severe with PASP 67/50mmHg - resolved on followup echo   PVC's (premature ventricular contractions) 11/04/2015   Sleep apnea    cpap    PAST SURGICAL HISTORY: Past Surgical History:  Procedure Laterality Date   CARDIAC CATHETERIZATION N/A 05/20/2015   Procedure: Right/Left Heart Cath and Coronary Angiography;  Surgeon: Debby DELENA Sor, Peck;  Location: [facility]VASIVE [facility];  Service: Cardiovascular;  Laterality: N/A;   CATARACT EXTRACTION W/ INTRAOCULAR LENS  IMPLANT, BILATERAL     COLONOSCOPY  2018   COLONOSCOPY N/A 08/24/2023   Procedure: COLONOSCOPY;  Surgeon: Leigh Elspeth SQUIBB, Peck;  Location: [facility] ENDOSCOPY;  Service: Gastroenterology;  Laterality: N/A;   DILATION AND CURETTAGE OF UTERUS     lumpectomy left breast     POLYPECTOMY     UTERINE FIBROID SURGERY      ALLERGIES: Allergies  Allergen Reactions   Jardiance  [Empagliflozin ] Anaphylaxis, Itching, Rash and Other (See Comments)    Yeast infection   Atorvastatin Other (See Comments)    REACTION: mylagias   Other Other (See Comments)    Product containing 3-hydroxy-3-methylglutaryl-coenzyme A reductase inhibitor (product)   Pravastatin Sodium Other (See Comments)    REACTION: myalgias   Sulfa Antibiotics Hives    FAMILY HISTORY:  Family History  Problem Relation Age of Onset   Arthritis Mother    Depression Mother    Hearing loss Mother    Hyperlipidemia Mother    Miscarriages / Stillbirths Mother    Colon cancer Mother 21       2010   Colon polyps Mother    Alcohol abuse Father    Cancer Father        PROSTATE   Depression Father    Hypertension Father    Hyperlipidemia Father    Drug abuse Brother    Early death Maternal Grandmother    Cancer Paternal Grandmother    Heart attack Neg Hx    Diabetes Neg Hx    Esophageal cancer Neg Hx    Rectal cancer Neg Hx    Stomach cancer Neg Hx     SOCIAL HISTORY: Social History   Socioeconomic History   Marital status: Significant Other    Spouse name: Not on file   Number of children: 0   Years of education: Not on file   Highest education level: Not on file  Occupational History   Occupation: Truck Hospital Doctor   Occupation: Environmental Manager  Tobacco Use   Smoking status: Former    Current packs/day: 0.00    Types: Cigarettes    Quit date: 06/18/2009    Years since quitting: 14.6   Smokeless tobacco: Never  Vaping Use   Vaping status: Never Used  Substance and Sexual Activity   Alcohol use: Not Currently    Comment: once every 2 months   Drug use: No   Sexual activity: Not Currently  Other Topics Concern   Not on file   Social History Narrative   ** Merged History Encounter **       Social Drivers of Health   Tobacco Use: Medium Risk (01/26/2024)   Patient History    Smoking Tobacco Use: Former    Smokeless Tobacco  Use: Never    Passive Exposure: Not on file  Financial Resource Strain: Low Risk (06/28/2023)   Overall Financial Resource Strain (CARDIA)    Difficulty of Paying Living Expenses: Not hard at all  Food Insecurity: No Food Insecurity (06/28/2023)   Hunger Vital Sign    Worried About Running Out of Food in the Last Year: Never true    Ran Out of Food in the Last Year: Never true  Transportation Needs: No Transportation Needs (06/28/2023)   PRAPARE - Administrator, Civil Service (Medical): No    Lack of Transportation (Non-Medical): No  Physical Activity: Insufficiently Active (06/28/2023)   Exercise Vital Sign    Days of Exercise per Week: 3 days    Minutes of Exercise per Session: 20 min  Stress: No Stress Concern Present (06/28/2023)   [employer] of Occupational Health - Occupational Stress Questionnaire    Feeling of Stress : Only a little  Social Connections: Moderately Isolated (06/28/2023)   Social Connection and Isolation Panel    Frequency of Communication with Friends and Family: Three times a week    Frequency of Social Gatherings with Friends and Family: Three times a week    Attends Religious Services: More than 4 times per year    Active Member of Clubs or Organizations: No    Attends Banker Meetings: Never    Marital Status: Never married  Depression (PHQ2-9): Low Risk (08/30/2023)   Depression (PHQ2-9)    PHQ-2 Score: 0  Alcohol Screen: Low Risk (06/28/2023)   Alcohol Screen    Last Alcohol Screening Score (AUDIT): 2  Housing: Unknown (06/28/2023)   Housing Stability Vital Sign    Unable to Pay for Housing in the Last Year: No    Number of Times Moved in the Last Year: Not on file    Homeless in the Last Year: No  Utilities: Not At  Risk (06/28/2023)   AHC Utilities    Threatened with loss of utilities: No  Health Literacy: Adequate Health Literacy (06/28/2023)   B1300 Health Literacy    Frequency of need for help with medical instructions: Never    MEDICATIONS:  Current Outpatient Medications  Medication Sig Dispense Refill   Accu-Chek Softclix Lancets lancets USE 1 STRIP TO CHECK BLOOD SUGAR 2-3 TIMES DAILY 100 each 12   acetaminophen  (TYLENOL ) 650 MG CR tablet Take 1,300 mg by mouth as needed for pain.     albuterol  (VENTOLIN  HFA) 108 (90 Base) MCG/ACT inhaler SMARTSIG:2 Puff(s) By Mouth Every 6 Hours PRN     aspirin  81 MG tablet Take 81 mg by mouth daily.     Blood Glucose Monitoring Suppl (ACCU-CHEK GUIDE) w/Device KIT USE TO CHECK BLOOD SUGAR 2-3 TIMES DAILY 1 kit 0   carvedilol  (COREG ) 6.25 MG tablet Take 1 tablet (6.25 mg total) by mouth 2 (two) times daily with a meal. 60 tablet 0   clotrimazole  (LOTRIMIN ) 1 % cream Apply 1 Application topically 2 (two) times daily. 60 g 0   Cyanocobalamin  (VITAMIN B 12 PO) Take by mouth.     diclofenac  Sodium (VOLTAREN ) 1 % GEL Apply 4 g topically 4 (four) times daily. Arthritis 350 g 1   diphenhydrAMINE HCl (BENADRYL ALLERGY PO) Take 1 tablet by mouth as needed.     ezetimibe  (ZETIA ) 10 MG tablet Take 1 tablet (10 mg total) by mouth daily. 30 tablet 0   furosemide  (LASIX ) 40 MG tablet TAKE 1 TO 2 TABLETS BY MOUTH ONCE DAILY  AS DIRECTED 180 tablet 1   glucose blood (ACCU-CHEK GUIDE TEST) test strip USE TO CHECK BLOOD SUGAR 2-3 TIMES DAILY 300 each 12   Multiple Vitamin (MULTIVITAMIN) tablet Take 1 tablet by mouth daily.     pregabalin  (LYRICA ) 50 MG capsule Take 1 capsule (50 mg total) by mouth at bedtime. For nerve pain. 30 capsule 0   rosuvastatin  (CRESTOR ) 5 MG tablet Take 1 tablet by mouth once daily 90 tablet 0   sacubitril -valsartan  (ENTRESTO ) 24-26 MG Take 1 tablet by mouth 2 (two) times daily. 180 tablet 2   Semaglutide,0.25 or 0.5MG /DOS, (OZEMPIC, 0.25 OR 0.5  MG/DOSE,) 2 MG/3ML SOPN Take 2.5 mg weekly for 4 weeks and increase to 0.5 mg weekly. 3 mL 11   spironolactone  (ALDACTONE ) 25 MG tablet Take 1/2 (one-half) tablet by mouth once daily 45 tablet 3   vitamin C (ASCORBIC ACID) 500 MG tablet Take 2,000 mg by mouth as needed.      metFORMIN  (GLUCOPHAGE -XR) 500 MG 24 hr tablet 3 tabs qd 270 tablet 3   No current facility-administered medications for this visit.    PHYSICAL EXAM: Vitals:   01/26/24 0935  BP: 132/80  Pulse: 75  Resp: 16  SpO2: 99%  Weight: 214 lb 12.8 oz (97.4 kg)  Height: 5' 4 (1.626 m)    Body mass index is 36.87 kg/m.  Wt Readings from Last 3 Encounters:  01/26/24 214 lb 12.8 oz (97.4 kg)  01/19/24 215 lb (97.5 kg)  12/04/23 212 lb 9.6 oz (96.4 kg)    General: Well developed, well nourished female in no apparent distress.  HEENT: AT/[place], no external lesions.  Eyes: Conjunctiva clear and no icterus. Neck: Neck supple  Lungs: Respirations not labored Neurologic: Alert, oriented, normal speech Extremities / Skin: Dry.  Psychiatric: Does not appear depressed or anxious  Diabetic Foot Exam - Simple   No data filed     LABS Reviewed Lab Results  Component Value Date   HGBA1C 5.9 (A) 01/26/2024   HGBA1C 5.9 (A) 05/30/2023   HGBA1C 6.5 12/07/2022   Lab Results  Component Value Date   FRUCTOSAMINE 219 12/23/2019   Lab Results  Component Value Date   CHOL 133 12/07/2022   HDL 54.70 12/07/2022   LDLCALC 61 12/07/2022   TRIG 89.0 12/07/2022   CHOLHDL 2 12/07/2022   Lab Results  Component Value Date   MICRALBCREAT 9.0 08/30/2023   Lab Results  Component Value Date   CREATININE 0.80 01/19/2024   Lab Results  Component Value Date   GFR 90.10 12/07/2022    ASSESSMENT / PLAN  1. Type 2 diabetes mellitus without complication, without long-term current use of insulin  (HCC)     Diabetes Mellitus type 2, complicated by no known complications.  - Diabetic status / severity: controlled.   Lab  Results  Component Value Date   HGBA1C 5.9 (A) 01/26/2024    - Hemoglobin A1c goal : <6.5%  Patient elected to try GLP-1 receptor agonist to have weight loss benefit and cardiovascular benefit.  - Medications: no change.   I) continue metformin  ER 500 mg 3 tablets daily. II) start Ozempic 0.25 mg weekly for 4 weeks and increase to 0.5 mg weekly.  Discussed about potential side effects.  Demonstrated Ozempic injection in the clinic today by nurse.  - Home glucose testing: At least few times a week at different times of the day.  She wants to check more often to keep track of her diet and control blood sugar.  -  Discussed/ Gave Hypoglycemia treatment plan.  # Consult : not required at this time.   # Annual urine for microalbuminuria/ creatinine ratio, no microalbuminuria currently, continue ACE/ARB / valsartan  Last  Lab Results  Component Value Date   MICRALBCREAT 9.0 08/30/2023    # Foot check nightly.  # Annual dilated diabetic eye exams.   - Diet: Eat reasonable portion sizes to promote a healthy weight - Life style / activity / exercise: discussed.  2. Blood pressure  -  BP Readings from Last 1 Encounters:  01/26/24 132/80    - Control is in target.  - No change in current plans.  3. Lipid status / Hyperlipidemia - Last  Lab Results  Component Value Date   LDLCALC 61 12/07/2022   - Continue rosuvastatin  5 mg daily and Zetia  10 mg daily managed by PCP.    Diagnoses and all orders for this visit:  Type 2 diabetes mellitus without complication, without long-term current use of insulin  (HCC) -     POCT glycosylated hemoglobin (Hb A1C) -     metFORMIN  (GLUCOPHAGE -XR) 500 MG 24 hr tablet; 3 tabs qd -     Semaglutide,0.25 or 0.5MG /DOS, (OZEMPIC, 0.25 OR 0.5 MG/DOSE,) 2 MG/3ML SOPN; Take 2.5 mg weekly for 4 weeks and increase to 0.5 mg weekly.    DISPOSITION Follow up in clinic in 4 months suggested.   All questions answered and patient verbalized  understanding of the plan.  Naleyah Ohlinger, Peck [facility] Endocrinology [facility] [address] [place], [address] [place], [place] [address] Phone # [phone]  At least part of this note was generated using voice recognition software. Inadvertent word errors may have occurred, which were not recognized during the proofreading process.

## 2024-01-30 ENCOUNTER — Telehealth: Payer: Self-pay | Admitting: *Deleted

## 2024-01-30 ENCOUNTER — Encounter: Payer: Self-pay | Admitting: Physician Assistant

## 2024-01-30 NOTE — Telephone Encounter (Signed)
 Cardiac Catheterization scheduled at [facility] for: Wednesday January 31, 2024 1 PM  Arrival time [facility] Main Entrance A at: 11 AM  Diet:  -May have light meal until 7 AM. (6 hours before procedure time) Approved light meal consists of plain toast, fruit, light soups, crackers.  Hydration: -May drink clear liquids until 2 hours before the procedure.  Approved liquids: Water , clear tea, black coffee, fruit juices-non-citric and without pulp,Gatorade, plain Jello/popsicles.   -Please drink 16 oz of water  2 hours before procedure.  Medication instructions: -Hold:  Metformin -day of procedure and 48 hours post procedure  Lasix /Spironolactone  -AM of procedure -Other usual morning medications can be taken including aspirin  81 mg.  Pt tells me she has not started Ozempic  yet.  Plan to go home the same day, you will only stay overnight if medically necessary.  You must have responsible adult to drive you home.  Someone must be with you the first 24 hours after you arrive home.  Reviewed procedure instructions with patient.

## 2024-01-30 NOTE — Progress Notes (Signed)
 Procedure Type: Isolated AVR Perioperative Outcome Estimate % Operative Mortality 2.26% Morbidity & Mortality 8.77% Stroke 0.992% Renal Failure 1.5% Reoperation 2.49% Prolonged Ventilation 5.35% Deep Sternal Wound Infection 0.097% Long Hospital Stay (>14 days) 4.45% Short Hospital Stay (<6 days)* 39%

## 2024-01-31 ENCOUNTER — Ambulatory Visit (HOSPITAL_COMMUNITY)
Admission: RE | Admit: 2024-01-31 | Discharge: 2024-01-31 | Disposition: A | Discharge status: Home / Self Care | Attending: Cardiovascular Disease | Admitting: Cardiovascular Disease

## 2024-01-31 ENCOUNTER — Encounter (HOSPITAL_COMMUNITY): Admission: RE | Disposition: A | Payer: Self-pay | Attending: Cardiovascular Disease

## 2024-01-31 ENCOUNTER — Other Ambulatory Visit: Payer: Self-pay

## 2024-01-31 ENCOUNTER — Encounter: Payer: Self-pay | Admitting: Cardiovascular Disease

## 2024-01-31 DIAGNOSIS — Z79899 Other long term (current) drug therapy: Secondary | ICD-10-CM | POA: Diagnosis not present

## 2024-01-31 DIAGNOSIS — I350 Nonrheumatic aortic (valve) stenosis: Secondary | ICD-10-CM | POA: Diagnosis present

## 2024-01-31 DIAGNOSIS — I5042 Chronic combined systolic (congestive) and diastolic (congestive) heart failure: Secondary | ICD-10-CM | POA: Insufficient documentation

## 2024-01-31 DIAGNOSIS — E785 Hyperlipidemia, unspecified: Secondary | ICD-10-CM | POA: Diagnosis not present

## 2024-01-31 DIAGNOSIS — Z7982 Long term (current) use of aspirin: Secondary | ICD-10-CM | POA: Insufficient documentation

## 2024-01-31 DIAGNOSIS — G4733 Obstructive sleep apnea (adult) (pediatric): Secondary | ICD-10-CM | POA: Diagnosis not present

## 2024-01-31 DIAGNOSIS — E1142 Type 2 diabetes mellitus with diabetic polyneuropathy: Secondary | ICD-10-CM | POA: Diagnosis not present

## 2024-01-31 DIAGNOSIS — I428 Other cardiomyopathies: Secondary | ICD-10-CM | POA: Insufficient documentation

## 2024-01-31 DIAGNOSIS — Z87891 Personal history of nicotine dependence: Secondary | ICD-10-CM | POA: Insufficient documentation

## 2024-01-31 DIAGNOSIS — I110 Hypertensive heart disease with heart failure: Secondary | ICD-10-CM | POA: Insufficient documentation

## 2024-01-31 DIAGNOSIS — Z6836 Body mass index (BMI) 36.0-36.9, adult: Secondary | ICD-10-CM | POA: Diagnosis not present

## 2024-01-31 DIAGNOSIS — E669 Obesity, unspecified: Secondary | ICD-10-CM | POA: Insufficient documentation

## 2024-01-31 DIAGNOSIS — I2510 Atherosclerotic heart disease of native coronary artery without angina pectoris: Secondary | ICD-10-CM | POA: Diagnosis not present

## 2024-01-31 DIAGNOSIS — Z7984 Long term (current) use of oral hypoglycemic drugs: Secondary | ICD-10-CM | POA: Diagnosis not present

## 2024-01-31 HISTORY — PX: RIGHT HEART CATH AND CORONARY ANGIOGRAPHY: CATH118264

## 2024-01-31 LAB — POCT I-STAT 7, (LYTES, BLD GAS, ICA,H+H)
Acid-base deficit: 4 mmol/L — ABNORMAL HIGH (ref 0.0–2.0)
Bicarbonate: 19.4 mmol/L — ABNORMAL LOW (ref 20.0–28.0)
Calcium, Ion: 1.28 mmol/L (ref 1.15–1.40)
HCT: 35 % — ABNORMAL LOW (ref 36.0–46.0)
Hemoglobin: 11.9 g/dL — ABNORMAL LOW (ref 12.0–15.0)
O2 Saturation: 90 %
Potassium: 3.8 mmol/L (ref 3.5–5.1)
Sodium: 140 mmol/L (ref 135–145)
TCO2: 20 mmol/L — ABNORMAL LOW (ref 22–32)
pCO2 arterial: 31.1 mmHg — ABNORMAL LOW (ref 32–48)
pH, Arterial: 7.402 (ref 7.35–7.45)
pO2, Arterial: 57 mmHg — ABNORMAL LOW (ref 83–108)

## 2024-01-31 LAB — GLUCOSE, CAPILLARY: Glucose-Capillary: 90 mg/dL (ref 70–99)

## 2024-01-31 LAB — POCT I-STAT EG7
Acid-base deficit: 9 mmol/L — ABNORMAL HIGH (ref 0.0–2.0)
Bicarbonate: 17.4 mmol/L — ABNORMAL LOW (ref 20.0–28.0)
Calcium, Ion: 1.12 mmol/L — ABNORMAL LOW (ref 1.15–1.40)
HCT: 34 % — ABNORMAL LOW (ref 36.0–46.0)
Hemoglobin: 11.6 g/dL — ABNORMAL LOW (ref 12.0–15.0)
O2 Saturation: 57 %
Potassium: 3.3 mmol/L — ABNORMAL LOW (ref 3.5–5.1)
Sodium: 128 mmol/L — ABNORMAL LOW (ref 135–145)
TCO2: 18 mmol/L — ABNORMAL LOW (ref 22–32)
pCO2, Ven: 36.7 mmHg — ABNORMAL LOW (ref 44–60)
pH, Ven: 7.283 (ref 7.25–7.43)
pO2, Ven: 33 mmHg (ref 32–45)

## 2024-01-31 SURGERY — RIGHT HEART CATH AND CORONARY ANGIOGRAPHY
Anesthesia: LOCAL

## 2024-01-31 MED ORDER — HEPARIN (PORCINE) IN NACL 1000-0.9 UT/500ML-% IV SOLN
INTRAVENOUS | Status: DC | PRN
  Administered 2024-01-31 (×2): 500 mL

## 2024-01-31 MED ORDER — FENTANYL CITRATE (PF) 100 MCG/2ML IJ SOLN
INTRAMUSCULAR | Status: AC
  Filled 2024-01-31: qty 2

## 2024-01-31 MED ORDER — HEPARIN SODIUM (PORCINE) 1000 UNIT/ML IJ SOLN
INTRAMUSCULAR | Status: AC
  Filled 2024-01-31: qty 10

## 2024-01-31 MED ORDER — MIDAZOLAM HCL (PF) 2 MG/2ML IJ SOLN
INTRAMUSCULAR | Status: DC | PRN
  Administered 2024-01-31: 16:00:00 1 mg via INTRAVENOUS

## 2024-01-31 MED ORDER — ONDANSETRON HCL 4 MG/2ML IJ SOLN: 4.0000 mg | Freq: Four times a day (QID) | INTRAMUSCULAR | Status: DC | PRN

## 2024-01-31 MED ORDER — SODIUM CHLORIDE 0.9 % IV SOLN
INTRAVENOUS | Status: DC | PRN
  Administered 2024-01-31: 16:00:00 10 mL/h via INTRAVENOUS

## 2024-01-31 MED ORDER — LABETALOL HCL 5 MG/ML IV SOLN: 10.0000 mg | INTRAVENOUS | Status: DC | PRN

## 2024-01-31 MED ORDER — LIDOCAINE HCL (PF) 1 % IJ SOLN
INTRAMUSCULAR | Status: DC | PRN
  Administered 2024-01-31 (×2): 5 mL via INTRADERMAL

## 2024-01-31 MED ORDER — LIDOCAINE HCL (PF) 1 % IJ SOLN
INTRAMUSCULAR | Status: AC
  Filled 2024-01-31: qty 30

## 2024-01-31 MED ORDER — SODIUM CHLORIDE 0.9% FLUSH: 3.0000 mL | Freq: Two times a day (BID) | INTRAVENOUS | Status: DC

## 2024-01-31 MED ORDER — SODIUM CHLORIDE 0.9 % IV SOLN: 250.0000 mL | INTRAVENOUS | Status: DC | PRN

## 2024-01-31 MED ORDER — ACETAMINOPHEN 325 MG PO TABS: 650.0000 mg | ORAL_TABLET | ORAL | Status: DC | PRN

## 2024-01-31 MED ORDER — FREE WATER: 500.0000 mL | Freq: Once | Status: DC

## 2024-01-31 MED ORDER — SODIUM CHLORIDE 0.9% FLUSH: 3.0000 mL | INTRAVENOUS | Status: DC | PRN

## 2024-01-31 MED ORDER — IOHEXOL 350 MG/ML SOLN
INTRAVENOUS | Status: DC | PRN
  Administered 2024-01-31: 16:00:00 20 mL

## 2024-01-31 MED ORDER — MIDAZOLAM HCL 2 MG/2ML IJ SOLN
INTRAMUSCULAR | Status: AC
  Filled 2024-01-31: qty 2

## 2024-01-31 MED ORDER — VERAPAMIL HCL 2.5 MG/ML IV SOLN
INTRAVENOUS | Status: AC
  Filled 2024-01-31: qty 2

## 2024-01-31 MED ORDER — ASPIRIN 81 MG PO CHEW: 81.0000 mg | CHEWABLE_TABLET | ORAL | Status: DC

## 2024-01-31 MED ORDER — HEPARIN SODIUM (PORCINE) 1000 UNIT/ML IJ SOLN
INTRAMUSCULAR | Status: DC | PRN
  Administered 2024-01-31: 16:00:00 5000 [IU] via INTRAVENOUS

## 2024-01-31 MED ORDER — HYDRALAZINE HCL 20 MG/ML IJ SOLN: 10.0000 mg | INTRAMUSCULAR | Status: DC | PRN

## 2024-01-31 MED ADMIN — RADIAL COCKTAIL/VERAPAMIL ONLY: 10 mL | INTRA_ARTERIAL | @ 16:00:00 | NDC 43066003125

## 2024-01-31 MED ADMIN — Fentanyl Citrate Preservative Free (PF) Inj 100 MCG/2ML: 25 ug | INTRAVENOUS | @ 16:00:00 | NDC 72572017025

## 2024-01-31 SURGICAL SUPPLY — 8 items
CATH 5FR JL3.5 JR4 ANG PIG MP (CATHETERS) IMPLANT
CATH BALLN WEDGE 5F 110CM (CATHETERS) IMPLANT
DEVICE RAD COMP TR BAND LRG (VASCULAR PRODUCTS) IMPLANT
GLIDESHEATH SLEND SS 6F .021 (SHEATH) IMPLANT
GUIDEWIRE INQWIRE 1.5J.035X260 (WIRE) IMPLANT
PACK CARDIAC CATHETERIZATION (CUSTOM PROCEDURE TRAY) ×1 IMPLANT
SET ATX-X65L (MISCELLANEOUS) IMPLANT
SHEATH GLIDE SLENDER 4/5FR (SHEATH) IMPLANT

## 2024-01-31 NOTE — Discharge Instructions (Addendum)
 Hold metformin  for 48 hours post cath Take Lasix  80 mg daily for 3 days then 40 mg daily after that    Radial Site Care The following information offers guidance on how to care for yourself after your procedure. Your health care provider may also give you more specific instructions. If you have problems or questions, contact your health care provider. What can I expect after the procedure? After the procedure, it is common to have bruising and tenderness in the incision area. Follow these instructions at home: Incision site care  Follow instructions from your health care provider about how to take care of your incision site. Make sure you: Wash your hands with soap and water  for at least 20 seconds before and after you change your bandage (dressing). If soap and water  are not available, use hand sanitizer. Remove your dressing in 24 hours. Leave stitches (sutures), skin glue, or adhesive strips in place. These skin closures may need to stay in place for 2 weeks or longer. If adhesive strip edges start to loosen and curl up, you may trim the loose edges. Do not remove adhesive strips completely unless your health care provider tells you to do that. Do not take baths, swim, or use a hot tub for at least 1 week. You may shower 24 hours after the procedure or as told by your health care provider. Remove the dressing and gently wash the incision area with plain soap and water . Pat the area dry with a clean towel. Do not rub the site. That could cause bleeding. Do not apply powder or lotion to the site. Check your incision site every day for signs of infection. Check for: Redness, swelling, or pain. Fluid or blood. Warmth. Pus or a bad smell. Activity For 24 hours after the procedure, or as directed by your health care provider: Do not flex or bend the affected arm. Do not push or pull heavy objects with the affected arm. Do not operate machinery or power tools. Do not drive. You should not  drive yourself home from the hospital or clinic if you go home during that time period. You may drive 24 hours after the procedure unless your health care provider tells you not to. Do not lift anything that is heavier than 10 lb (4.5 kg), or the limit that you are told, until your health care provider says that it is safe. Return to your normal activities as told by your health care provider. Ask your health care provider what activities are safe for you and when you can return to work. If you were given a sedative during the procedure, it can affect you for several hours. Do not drive or operate machinery until your health care provider says that it is safe. General instructions Take over-the-counter and prescription medicines only as told by your health care provider. If you will be going home right after the procedure, plan to have a responsible adult care for you for the time you are told. This is important. Keep all follow-up visits. This is important. Contact a health care provider if: You have a fever or chills. You have any of these signs of infection at your incision site: Redness, swelling, or pain. Fluid or blood. Warmth. Pus or a bad smell. Get help right away if: The incision area swells very fast. The incision area is bleeding, and the bleeding does not stop when you hold steady pressure on the area. Your arm or hand becomes pale, cool, tingly, or numb.  These symptoms may represent a serious problem that is an emergency. Do not wait to see if the symptoms will go away. Get medical help right away. Call your local emergency services (911 in the [place] Do not drive yourself to the hospital. Summary After the procedure, it is common to have bruising and tenderness at the incision site. Follow instructions from your health care provider about how to take care of your radial site incision. Check the incision every day for signs of infection. Do not lift anything that is heavier than  10 lb (4.5 kg), or the limit that you are told, until your health care provider says that it is safe. Get help right away if the incision area swells very fast, you have bleeding at the incision site that will not stop, or your arm or hand becomes pale, cool, or numb. This information is not intended to replace advice given to you by your health care provider. Make sure you discuss any questions you have with your health care provider. Document Revised: 03/22/2020 Document Reviewed: 03/22/2020 Elsevier Patient Education  2024 [employer].

## 2024-01-31 NOTE — Progress Notes (Signed)
 Discharge instructions reviewed with patient and friend at bedside. Denies questions concerns. PT tolerated PO intake. Seen by MD. Incision site remains clean dry and intact. No s/s of complications. PT escorted from the unit via wheel chair to personal vehicle.

## 2024-01-31 NOTE — Interval H&P Note (Signed)
 History and Physical Interval Note:  01/31/2024 2:18 PM  [name]  has presented today for surgery, with the diagnosis of as.  The various methods of treatment have been discussed with the patient and family. After consideration of risks, benefits and other options for treatment, the patient has consented to  Procedures: RIGHT/LEFT HEART CATH AND CORONARY ANGIOGRAPHY (N/A) as a surgical intervention.  The patient's history has been reviewed, patient examined, no change in status, stable for surgery.  I have reviewed the patient's chart and labs.  Questions were answered to the patient's satisfaction.    Cath Lab Visit (complete for each Cath Lab visit)  Clinical Evaluation Leading to the Procedure:   ACS: No.  Non-ACS:    Anginal Classification: No Symptoms  Anti-ischemic medical therapy: No Therapy  Non-Invasive Test Results: No non-invasive testing performed  Prior CABG: No previous CABG        [name]

## 2024-02-01 ENCOUNTER — Encounter (HOSPITAL_COMMUNITY): Payer: Self-pay | Admitting: Cardiovascular Disease

## 2024-02-02 ENCOUNTER — Telehealth: Payer: Self-pay

## 2024-02-02 NOTE — Telephone Encounter (Signed)
 Called the patient to arrange TCTS consult for TAVR/SAVR workup. Spoke with the patient at length as she is confused about her return to work date. While Dr. 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 He only wrote her out for a week per request of the patient so she'd have time to talk with her boss about a plan.  Scheduled the patient to see Dr. Lucas on 02/14/2024. Since her plan is not finalized and she is not allowed to drive until after intervention, will write her an out of work note until 03/30/2024 per her request so work can plan. She understands that date could change. She will get her boss's fax number and call back.  She was grateful for call and agreed with plan.

## 2024-02-05 ENCOUNTER — Telehealth: Payer: Self-pay | Admitting: Cardiovascular Disease

## 2024-02-05 DIAGNOSIS — Z0279 Encounter for issue of other medical certificate: Secondary | ICD-10-CM

## 2024-02-05 NOTE — Telephone Encounter (Signed)
 Patient brought in a [employer]disability form. Patient signed the Release of Information and paid $29 fee.  Form in [facility] box.

## 2024-02-07 ENCOUNTER — Other Ambulatory Visit: Payer: Self-pay | Admitting: Physician Assistant

## 2024-02-07 NOTE — Telephone Encounter (Signed)
 Disability form faxed to [employer] and scanned into chart.  Billing and patient notified.

## 2024-02-07 NOTE — Telephone Encounter (Signed)
 Completed paperwork placed in paperwork box

## 2024-02-09 NOTE — Telephone Encounter (Signed)
 Last OV: 12/04/2023  Next OV: 06/04/2024  Last Refill: 01/03/2024  Dispense: 30/0

## 2024-02-13 ENCOUNTER — Other Ambulatory Visit: Payer: Self-pay

## 2024-02-13 ENCOUNTER — Ambulatory Visit: Attending: Surgery | Admitting: Surgery

## 2024-02-13 ENCOUNTER — Encounter: Payer: Self-pay | Admitting: Surgery

## 2024-02-13 VITALS — BP 126/78 | HR 87 | Resp 20 | Ht 64.0 in | Wt 222.0 lb

## 2024-02-13 DIAGNOSIS — I350 Nonrheumatic aortic (valve) stenosis: Secondary | ICD-10-CM | POA: Diagnosis not present

## 2024-02-13 NOTE — Progress Notes (Signed)
 "  [address], [place] [address]             [phone]      [facility]   MULTIDISCIPLINARY HEART VALVE CLINIC         CARDIOTHORACIC SURGERY CONSULTATION REPORT  PCP is [name], [name], PA-C Referring Provider is [name], MD Primary Cardiologist is [name], MD  Reason for consultation:  severe aortic stenosis  HPI:  The patient is a 68 year old woman with history of type 2 diabetes, hyperlipidemia, hypertension, OSA on CPAP, remote smoking, history of breast cancer status postlumpectomy, history of nonischemic cardiomyopathy with chronic combined systolic and diastolic congestive heart failure, morbid obesity (BMI 37) and severe aortic stenosis who was referred for consideration of TAVR vs open surgical aortic valve replacement.  She was seen in the structural heart clinic in February 2025 for workup of her aortic stenosis.  She had a history of reduced ejection fraction of 30% dating back to 2015.  Cardiac catheterization at that time showed no evidence of coronary disease.  She has been followed by cardiology with moderate aortic stenosis.  Echocardiogram in January 2025 showed thickened and calcified aortic valve leaflets with a mean gradient of 31 mmHg, AVA of 0.79 cm, dimensionless index of 0.26, and low stroke-volume index of 32 consistent with severe low-flow/low gradient aortic stenosis.  Left ventricular ejection fraction was mildly decreased at 45% with global hypokinesis and grade 2 diastolic dysfunction.  She was asymptomatic at that time and continued follow-up was recommended.  TAVR workup with CT scans was performed and February 2025.  A follow-up echocardiogram on 06/29/2023 showed a mean gradient across the aortic valve of 32 mmHg with a valve area of 0.78 cm.  Left ventricular ejection fraction was 45%.  She recently saw Dr. Cash in the office on 01/19/2024.  Her most recent echo on 01/02/2024 was essentially  unchanged with a mean gradient of 24.5 mmHg, AVA of 0.68 cm, and dimensionless index of 0.24.  Stroke-volume index was reduced to 25 and ejection fraction fell further to 35 to 40% with global hypokinesis and mild dilation.  She is here today by herself.  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  She is hopeful that she can return to work after valve replacement.  She says that she feels well overall with mild dyspnea on exertion and fatigue.  She says she is able to walk about 30 minutes on a treadmill on level ground.  She denies any dizziness or syncope.  She denies any peripheral edema.  She has had no chest pain or pressure.  Past Medical History:  Diagnosis Date   Anxiety    Aortic stenosis, mod to severe    echo 2017   Arthritis    Breast cancer (HCC) 1996   left breast   Carpal tunnel syndrome    Cataract    Chronic diastolic CHF (congestive heart failure) (HCC)    NYHA class II   COVID-19    12/02/21   Diabetes mellitus without complication (HCC)    type 2   Hyperlipidemia    Hypertension    Left bundle branch block (LBBB)    Low back pain    Neuropathy    NICM (nonischemic cardiomyopathy) (HCC)    EF 30%, cath 05/20/2015 clean coronary.  EF resolved by echo with EF  55%   OSA on CPAP    Prediabetes    Pulmonary HTN (HCC) 05/2015   Severe with PASP 67/66mmHg - resolved on followup echo   PVC's (premature ventricular contractions) 11/04/2015   Sleep apnea    cpap    Past Surgical History:  Procedure Laterality Date   CARDIAC CATHETERIZATION N/A 05/20/2015   Procedure: Right/Left Heart Cath and Coronary Angiography;  Surgeon: Debby DELENA Sor, MD;  Location: [facility];  Service: Cardiovascular;  Laterality: N/A;   CATARACT EXTRACTION W/ INTRAOCULAR LENS  IMPLANT, BILATERAL     COLONOSCOPY  2018   COLONOSCOPY N/A 08/24/2023   Procedure: COLONOSCOPY;  Surgeon: Leigh Elspeth SQUIBB, MD;  Location: [facility];  Service: Gastroenterology;  Laterality: N/A;   DILATION AND CURETTAGE OF UTERUS     lumpectomy left breast     POLYPECTOMY     RIGHT HEART CATH AND CORONARY ANGIOGRAPHY N/A 01/31/2024   Procedure: RIGHT HEART CATH AND CORONARY ANGIOGRAPHY;  Surgeon: Verlin Lonni BIRCH, MD;  Location: [facility];  Service: Cardiovascular;  Laterality: N/A;   UTERINE FIBROID SURGERY      Family History  Problem Relation Age of Onset   Arthritis Mother    Depression Mother    Hearing loss Mother    Hyperlipidemia Mother    Miscarriages / Stillbirths Mother    Colon cancer Mother 46       2010   Colon polyps Mother    Alcohol abuse Father    Cancer Father        PROSTATE   Depression Father    Hypertension Father    Hyperlipidemia Father    Drug abuse Brother    Early death Maternal Grandmother    Cancer Paternal Grandmother    Heart attack Neg Hx    Diabetes Neg Hx    Esophageal cancer Neg Hx    Rectal cancer Neg Hx    Stomach cancer Neg Hx     Social History   Socioeconomic History   Marital status: Significant Other    Spouse name: Not on file   Number of children: 0   Years of education: Not on file   Highest education level: Not on file  Occupational History   Occupation: Truck Hospital Doctor   Occupation: Environmental Manager  Tobacco Use   Smoking status: Former    Current packs/day: 0.00    Types: Cigarettes    Quit date: 06/18/2009    Years since quitting: 14.6   Smokeless tobacco: Never  Vaping Use   Vaping status: Never Used  Substance and Sexual Activity   Alcohol use: Not Currently    Comment: once every 2 months   Drug use: No   Sexual activity: Not Currently  Other Topics Concern   Not on file  Social History Narrative   ** Merged History Encounter **       Social Drivers of Health   Tobacco Use: Medium Risk (02/13/2024)   Patient History    Smoking Tobacco Use: Former    Smokeless Tobacco Use: Never     Passive Exposure: Not on Actuary Strain: Low Risk (06/28/2023)   Overall Financial Resource Strain (CARDIA)    Difficulty of Paying Living Expenses: Not hard at all  Food Insecurity: No Food Insecurity (06/28/2023)   Hunger Vital Sign    Worried About Running Out of Food in the Last Year: Never true    Ran Out of Food in the Last  Year: Never true  Transportation Needs: No Transportation Needs (06/28/2023)   PRAPARE - Administrator, Civil Service (Medical): No    Lack of Transportation (Non-Medical): No  Physical Activity: Insufficiently Active (06/28/2023)   Exercise Vital Sign    Days of Exercise per Week: 3 days    Minutes of Exercise per Session: 20 min  Stress: No Stress Concern Present (06/28/2023)   [employer] of Occupational Health - Occupational Stress Questionnaire    Feeling of Stress : Only a little  Social Connections: Moderately Isolated (06/28/2023)   Social Connection and Isolation Panel    Frequency of Communication with Friends and Family: Three times a week    Frequency of Social Gatherings with Friends and Family: Three times a week    Attends Religious Services: More than 4 times per year    Active Member of Clubs or Organizations: No    Attends Banker Meetings: Never    Marital Status: Never married  Intimate Partner Violence: Not At Risk (06/28/2023)   Humiliation, Afraid, Rape, and Kick questionnaire    Fear of Current or Ex-Partner: No    Emotionally Abused: No    Physically Abused: No    Sexually Abused: No  Depression (PHQ2-9): Low Risk (08/30/2023)   Depression (PHQ2-9)    PHQ-2 Score: 0  Alcohol Screen: Low Risk (06/28/2023)   Alcohol Screen    Last Alcohol Screening Score (AUDIT): 2  Housing: Unknown (06/28/2023)   Housing Stability Vital Sign    Unable to Pay for Housing in the Last Year: No    Number of Times Moved in the Last Year: Not on file    Homeless in the Last Year: No  Utilities: Not At Risk  (06/28/2023)   AHC Utilities    Threatened with loss of utilities: No  Health Literacy: Adequate Health Literacy (06/28/2023)   B1300 Health Literacy    Frequency of need for help with medical instructions: Never    Prior to Admission medications  Medication Sig Start Date End Date Taking? Authorizing Provider  Accu-Chek Softclix Lancets lancets USE 1 STRIP TO CHECK BLOOD SUGAR 2-3 TIMES DAILY 11/29/23  Yes Thapa, Sudan, MD  acetaminophen  (TYLENOL ) 650 MG CR tablet Take 1,300 mg by mouth as needed for pain.   Yes [provider]  albuterol  (VENTOLIN  HFA) 108 (90 Base) MCG/ACT inhaler SMARTSIG:2 Puff(s) By Mouth Every 6 Hours PRN 08/30/23  Yes [provider]  aspirin  81 MG tablet Take 81 mg by mouth daily.   Yes [provider]  Blood Glucose Monitoring Suppl (ACCU-CHEK GUIDE) w/Device KIT USE TO CHECK BLOOD SUGAR 2-3 TIMES DAILY 11/29/23  Yes Thapa, Sudan, MD  carvedilol  (COREG ) 6.25 MG tablet Take 1 tablet (6.25 mg total) by mouth 2 (two) times daily with a meal. 08/14/23  Yes Turner, Traci R, MD  clotrimazole  (LOTRIMIN ) 1 % cream Apply 1 Application topically 2 (two) times daily. 12/02/22  Yes [name], [name], PA-C  Cyanocobalamin  (VITAMIN B 12 PO) Take by mouth.   Yes [provider]  diclofenac  Sodium (VOLTAREN ) 1 % GEL Apply 4 g topically 4 (four) times daily. Arthritis 12/02/22  Yes [name], [name], PA-C  diphenhydrAMINE HCl (BENADRYL ALLERGY PO) Take 1 tablet by mouth as needed.   Yes [provider]  ezetimibe  (ZETIA ) 10 MG tablet Take 1 tablet (10 mg total) by mouth daily. 08/14/23  Yes Turner, [name], MD  furosemide  (LASIX ) 40 MG tablet TAKE 1 TO 2 TABLETS  BY MOUTH ONCE DAILY AS DIRECTED 08/17/23  Yes Turner, Traci R, MD  glucose blood (ACCU-CHEK GUIDE TEST) test strip USE TO CHECK BLOOD SUGAR 2-3 TIMES DAILY 11/29/23  Yes Thapa, Sudan, MD  metFORMIN  (GLUCOPHAGE -XR) 500 MG 24 hr tablet 3 tabs qd 01/26/24  Yes Thapa, Sudan, MD  Multiple  Vitamin (MULTIVITAMIN) tablet Take 1 tablet by mouth daily.   Yes [provider]  pregabalin  (LYRICA ) 50 MG capsule Take 1 capsule by mouth at bedtime 02/12/24  Yes [name], [name], PA-C  rosuvastatin  (CRESTOR ) 5 MG tablet Take 1 tablet by mouth once daily 11/30/23  Yes [name], [name], PA-C  sacubitril -valsartan  (ENTRESTO ) 24-26 MG Take 1 tablet by mouth 2 (two) times daily. 11/14/23  Yes Weaver, [name], PA-C  Semaglutide ,0.25 or 0.5MG /DOS, (OZEMPIC , 0.25 OR 0.5 MG/DOSE,) 2 MG/3ML SOPN Take 2.5 mg weekly for 4 weeks and increase to 0.5 mg weekly. 01/26/24  Yes Thapa, [place], MD  spironolactone  (ALDACTONE ) 25 MG tablet Take 1/2 (one-half) tablet by mouth once daily 09/18/23  Yes Turner, [name], MD  vitamin C (ASCORBIC ACID) 500 MG tablet Take 2,000 mg by mouth as needed.    Yes [provider]    Current Outpatient Medications  Medication Sig Dispense Refill   Accu-Chek Softclix Lancets lancets USE 1 STRIP TO CHECK BLOOD SUGAR 2-3 TIMES DAILY 100 each 12   acetaminophen  (TYLENOL ) 650 MG CR tablet Take 1,300 mg by mouth as needed for pain.     albuterol  (VENTOLIN  HFA) 108 (90 Base) MCG/ACT inhaler SMARTSIG:2 Puff(s) By Mouth Every 6 Hours PRN     aspirin  81 MG tablet Take 81 mg by mouth daily.     Blood Glucose Monitoring Suppl (ACCU-CHEK GUIDE) w/Device KIT USE TO CHECK BLOOD SUGAR 2-3 TIMES DAILY 1 kit 0   carvedilol  (COREG ) 6.25 MG tablet Take 1 tablet (6.25 mg total) by mouth 2 (two) times daily with a meal. 60 tablet 0   clotrimazole  (LOTRIMIN ) 1 % cream Apply 1 Application topically 2 (two) times daily. 60 g 0   Cyanocobalamin  (VITAMIN B 12 PO) Take by mouth.     diclofenac  Sodium (VOLTAREN ) 1 % GEL Apply 4 g topically 4 (four) times daily. Arthritis 350 g 1   diphenhydrAMINE HCl (BENADRYL ALLERGY PO) Take 1 tablet by mouth as needed.     ezetimibe  (ZETIA ) 10 MG tablet Take 1 tablet (10 mg total) by mouth daily. 30 tablet 0   furosemide  (LASIX ) 40 MG tablet TAKE 1 TO  2 TABLETS BY MOUTH ONCE DAILY AS DIRECTED 180 tablet 1   glucose blood (ACCU-CHEK GUIDE TEST) test strip USE TO CHECK BLOOD SUGAR 2-3 TIMES DAILY 300 each 12   metFORMIN  (GLUCOPHAGE -XR) 500 MG 24 hr tablet 3 tabs qd 270 tablet 3   Multiple Vitamin (MULTIVITAMIN) tablet Take 1 tablet by mouth daily.     pregabalin  (LYRICA ) 50 MG capsule Take 1 capsule by mouth at bedtime 30 capsule 2   rosuvastatin  (CRESTOR ) 5 MG tablet Take 1 tablet by mouth once daily 90 tablet 0   sacubitril -valsartan  (ENTRESTO ) 24-26 MG Take 1 tablet by mouth 2 (two) times daily. 180 tablet 2   Semaglutide ,0.25 or 0.5MG /DOS, (OZEMPIC , 0.25 OR 0.5 MG/DOSE,) 2 MG/3ML SOPN Take 2.5 mg weekly for 4 weeks and increase to 0.5 mg weekly. 3 mL 11   spironolactone  (ALDACTONE ) 25 MG tablet Take 1/2 (one-half) tablet by mouth once daily 45 tablet 3   vitamin C (ASCORBIC ACID) 500 MG tablet Take 2,000  mg by mouth as needed.      No current facility-administered medications for this visit.    Allergies[1]    Review of Systems:   General:  normal appetite, + decreased energy, no weight gain, no weight loss, no fever  Cardiac:  no chest pain with exertion, no chest pain at rest, + SOB with moderate exertion, no resting SOB, no PND, no orthopnea, no palpitations, no arrhythmia, no atrial fibrillation, no LE edema, no dizzy spells, no syncope  Respiratory:  + mild exertional shortness of breath, no home oxygen, no productive cough, no dry cough, no bronchitis, no wheezing, no hemoptysis, no asthma, no pain with inspiration or cough, + sleep apnea, + CPAP at night  GI:   no difficulty swallowing, no reflux, no frequent heartburn, no hiatal hernia, no abdominal pain, no constipation, no diarrhea, no hematochezia, no hematemesis, no melena  GU:   no dysuria,  no frequency, no urinary tract infection, no hematuria,  no kidney stones, no kidney disease  Vascular:  no pain suggestive of claudication, no pain in feet, no leg cramps, no varicose  veins, no DVT, no non-healing foot ulcer  Neuro:   no stroke, no TIA's, no seizures, no headaches, no temporary blindness one eye,  no slurred speech, no peripheral neuropathy, no chronic pain, no instability of gait, no memory/cognitive dysfunction  Musculoskeletal: + arthritis - primarily involving the knees bilaterally, + joint swelling, no myalgias, no difficulty walking, normal mobility   Skin:   no rash, no itching, no skin infections, no pressure sores or ulcerations  Psych:   no anxiety, no depression, no nervousness, no unusual recent stress  Eyes:   no blurry vision, no floaters, no recent vision changes, no glasses or contacts  ENT:   no hearing loss, no loose or painful teeth, no dentures, last saw dentist recently  Hematologic:  no easy bruising, no abnormal bleeding, no clotting disorder, no frequent epistaxis  Endocrine:  + diabetes, does check CBG's at home     Physical Exam:   BP 126/78 (BP Location: Right Arm, Patient Position: Sitting, Cuff Size: Large)   Pulse 87   Resp 20   Ht 5' 4 (1.626 m)   Wt 222 lb (100.7 kg)   SpO2 97% Comment: RA  BMI 38.11 kg/m   General:  well-appearing  HEENT:  Unremarkable, NCAT, PERLA, EOMI  Neck:   no JVD, no bruits, no adenopathy   Chest:   clear to auscultation, symmetrical breath sounds, no wheezes, no rhonchi   CV:   RRR, 3/6 systolic murmur RSB, no diastolic murmur  Abdomen:  soft, non-tender, no masses   Extremities:  warm, well-perfused, pedal pulses palpable, no lower extremity edema  Rectal/GU  Deferred  Neuro:   Grossly non-focal and symmetrical throughout  Skin:   Clean and dry, no rashes, no breakdown  Diagnostic Tests:  ECHOCARDIOGRAM REPORT       Patient Name:   [name] Date of Exam: 01/02/2024  Medical Rec #:  [id number]      Height:       64.0 in  Accession #:    [id number]     Weight:       212.6 lb  Date of Birth:  February 28, 1955      BSA:          2.008 m  Patient Age:    68 years       BP:  122/68 mmHg  Patient Gender: F              HR:           68 bpm.  Exam Location:  [address]   Procedure: 2D Echo, Cardiac Doppler and Color Doppler (Both Spectral and  Color            Flow Doppler were utilized during procedure).   Indications:    I35.0 Aortic Stenosis    History:        Patient has prior history of Echocardiogram examinations,  most                 recent 06/29/2023. Arrythmias:PVC and LBBB,                  Signs/Symptoms:Murmur; Risk Factors:Diabetes, Hypertension  and                 Dyslipidemia. Obstructive sleep apnea-CPAP. Nonischemic                  cardiomyopathy.    Sonographer:    Carl Rodgers-Jones RDCS  Referring Phys: [id number] MCALHANY   IMPRESSIONS     1. Left ventricular ejection fraction, by estimation, is 35 to 40%. The  left ventricle has moderately decreased function. The left ventricle  demonstrates global hypokinesis. The left ventricular internal cavity size  was mildly dilated. Left ventricular  diastolic parameters are consistent with Grade II diastolic dysfunction  (pseudonormalization). Elevated left ventricular end-diastolic pressure.  The E/e' is 19.   2. Right ventricular systolic function is normal. The right ventricular  size is normal.   3. The mitral valve is grossly normal. Trivial mitral valve  regurgitation.   4. Suspect low flow, low gradient severe AS. The aortic valve is  calcified. Aortic valve regurgitation is trivial. Moderate to severe  aortic valve stenosis. Aortic valve area, by VTI measures 0.68 cm. Aortic  valve mean gradient measures 24.5 mmHg.  Aortic valve Vmax measures 3.30 m/s. Peak gradient 43.6 mmHg, DI 0.24.   5. The inferior vena cava is normal in size with <50% respiratory  variability, suggesting right atrial pressure of 8 mmHg.   Comparison(s): Changes from prior study are noted. 06/29/2023: LVEF 45%,  severe AS - MG 32 mmHg.   Conclusion(s)/Recommendation(s): Compared to  the prior study, the LVEF has  decreased, which may be associated with worsening severity of aortic  stenosis.   FINDINGS   Left Ventricle: Left ventricular ejection fraction, by estimation, is 35  to 40%. The left ventricle has moderately decreased function. The left  ventricle demonstrates global hypokinesis. The left ventricular internal  cavity size was mildly dilated.  There is no left ventricular hypertrophy. Abnormal (paradoxical) septal  motion, consistent with left bundle branch block. Left ventricular  diastolic parameters are consistent with Grade II diastolic dysfunction  (pseudonormalization). Elevated left  ventricular end-diastolic pressure. The E/e' is 40.   Right Ventricle: The right ventricular size is normal. No increase in  right ventricular wall thickness. Right ventricular systolic function is  normal.   Left Atrium: Left atrial size was normal in size.   Right Atrium: Right atrial size was normal in size.   Pericardium: There is no evidence of pericardial effusion.   Mitral Valve: The mitral valve is grossly normal. Trivial mitral valve  regurgitation.   Tricuspid Valve: The tricuspid valve is grossly normal. Tricuspid valve  regurgitation is trivial.   Suspect low flow, low gradient severe  AS. The aortic valve is calcified.  Aortic valve regurgitation is trivial. Moderate to severe aortic stenosis  is present.  Pulmonic Valve: The pulmonic valve was normal in structure. Pulmonic valve  regurgitation is not visualized.   Aorta: The aortic root and ascending aorta are structurally normal, with  no evidence of dilitation.   Venous: The inferior vena cava is normal in size with less than 50%  respiratory variability, suggesting right atrial pressure of 8 mmHg.   IAS/Shunts: No atrial level shunt detected by color flow Doppler.     LEFT VENTRICLE  PLAX 2D  LVIDd:         5.60 cm   Diastology  LVIDs:         4.50 cm   LV e' medial:    7.06 cm/s  LV  PW:         0.90 cm   LV E/e' medial:  16.9  LV IVS:        0.60 cm   LV e' lateral:   6.22 cm/s  LVOT diam:     1.90 cm   LV E/e' lateral: 19.2  LV SV:         51  LV SV Index:   25  LVOT Area:     2.84 cm     RIGHT VENTRICLE  RV Basal diam:  3.50 cm  RV S prime:     12.83 cm/s  TAPSE (M-mode): 2.4 cm   LEFT ATRIUM             Index        RIGHT ATRIUM           Index  LA diam:        4.80 cm 2.39 cm/m   RA Area:     12.70 cm  LA Vol (A2C):   59.9 ml 29.83 ml/m  RA Volume:   30.10 ml  14.99 ml/m  LA Vol (A4C):   52.7 ml 26.25 ml/m  LA Biplane Vol: 57.5 ml 28.64 ml/m   AORTIC VALVE  AV Area (Vmax):    0.67 cm  AV Area (Vmean):   0.64 cm  AV Area (VTI):     0.68 cm  AV Vmax:           330.00 cm/s  AV Vmean:          230.500 cm/s  AV VTI:            0.744 m  AV Peak Grad:      43.6 mmHg  AV Mean Grad:      24.5 mmHg  LVOT Vmax:         77.80 cm/s  LVOT Vmean:        52.050 cm/s  LVOT VTI:          0.178 m  LVOT/AV VTI ratio: 0.24    AORTA  Ao Root diam: 2.80 cm  Ao Asc diam:  3.00 cm   MITRAL VALVE  MV Area (PHT): 4.13 cm     SHUNTS  MV Decel Time: 184 msec     Systemic VTI:  0.18 m  MV E velocity: 119.50 cm/s  Systemic Diam: 1.90 cm  MV A velocity: 63.75 cm/s  MV E/A ratio:  1.87   Vinie Maxcy MD  Electronically signed by Vinie Maxcy MD  Signature Date/Time: 01/02/2024/12:50:50 PM        Final      rocedures  RIGHT HEART CATH AND CORONARY ANGIOGRAPHY  Conclusion      Prox RCA lesion is 20% stenosed.   Mild disease in the RCA No disease in the LAD or Circumflex Elevation right heart pressures    Hemodynamics  Pressures Phases Air rest  Right     RA Mean  mmHg 9    RA A-Wave  mmHg 13    RA V-Wave  mmHg 11    RV  mmHg 62/16  Pulmonary     PA  mmHg 59/24 (40)    PCW Mean  mmHg 21.0    PCW A-Wave  mmHg 24.0    PCW V-Wave  mmHg 22.0    PAPi   3.9  Aortic     AO  mmHg 111/58 (78)    API   2.52    Saturations Phases Air rest     PA  % 57    Arterial  % 90    Hemo Data  Flowsheet Row Most Recent Value  Fick Cardiac Output 4.57 L/min  Fick Cardiac Output Index 2.26 (L/min)/BSA  RA A Wave 13 mmHg  RA V Wave 11 mmHg  RA Mean 9 mmHg  RV Systolic Pressure 62 mmHg  RV Diastolic Pressure 16 mmHg  RV EDP 16 mmHg  PA Systolic Pressure 59 mmHg  PA Diastolic Pressure 24 mmHg  PA Mean 40 mmHg  PW A Wave 24 mmHg  PW V Wave 22 mmHg  PW Mean 21 mmHg  AO Systolic Pressure 111 mmHg  AO Diastolic Pressure 58 mmHg  AO Mean 78 mmHg  QP/QS 1  TPVR Index 17.68 HRUI  TSVR Index 34.48 HRUI  PVR SVR Ratio 0.28  TPVR/TSVR Ratio 0.51    Narrative & Impression  CLINICAL DATA:  Aortic Stenosis   EXAM: Cardiac TAVR CT   TECHNIQUE: The patient was scanned on a Siemens Force 192 slice scanner. A 120 kV retrospective scan was triggered in the ascending thoracic aorta at 140 HU's. Gantry rotation speed was 250 msecs and collimation was .6 mm. No beta blockade or nitro were given. The 3D data set was reconstructed in 5% intervals of the R-R cycle. Systolic and diastolic phases were analyzed on a dedicated work station using MPR, MIP and VRT modes. The patient received 80 cc of contrast.   FINDINGS: Aortic Valve: Tri leaflet AV with calcium  score only 751   Aorta: No aneurysm normal arch vessels moderate calcific atherosclerosis   Sino-tubular Junction: 24.3 mm calcified   Ascending Thoracic Aorta: 27.6 mm   Aortic Arch: 23 mm   Descending Thoracic Aorta: 22 mm   Sinus of Valsalva Measurements:   Non-coronary: 26.5 mm   height 18.9  mm   Right - coronary: 24.1 mm   height 19.6 mm   Left -   coronary: 26.3 mm   height 19.5 mm   Coronary Artery Height above Annulus:   Left Main: 14.9 mm above annulus   Right Coronary: 16.9 mm above annulus   Virtual Basal Annulus Measurements:   Maximum / Minimum Diameter: 23.9 mm x 20.2 mm Average diameter 21.9 mm   Perimeter: 69.7 mm   Area: 378 mm2    Coronary Arteries: Sufficient height above annulus for deployment   Optimum Fluoroscopic Angle for Delivery: RAO 16 Caudal 3 degrees   IMPRESSION: 1. Tri leaflet AV with calcium  score only 751   2. Annular area of 378 mm2 suitable for a 23 mm Sapien 3 valve Sinuses small for 26 mm Medtronic valve   3.  Coronary arteries  sufficient height above annulus for deployment   4. Optimum angiographic angle for deployment RAO 16 Caudal 3 degrees   5.  Membranous septal length 8.7 mm   Maude Emmer   Electronically Signed: By: Maude Emmer M.D. On: 04/03/2023 13:23      Narrative & Impression  CLINICAL DATA:  Aortic valvular disease.  Evaluation prior to TAVR   EXAM: CT ANGIOGRAPHY CHEST, ABDOMEN AND PELVIS   TECHNIQUE: Non-contrast CT of the chest was initially obtained.   Multidetector CT imaging through the chest, abdomen and pelvis was performed using the standard protocol during bolus administration of intravenous contrast. Multiplanar reconstructed images and MIPs were obtained and reviewed to evaluate the vascular anatomy.   RADIATION DOSE REDUCTION: This exam was performed according to the departmental dose-optimization program which includes automated exposure control, adjustment of the mA and/or kV according to patient size and/or use of iterative reconstruction technique.   CONTRAST:  95 mL OMNIPAQUE  IOHEXOL  350 MG/ML SOLN   COMPARISON:  Prior CT scan of the chest 01/16/2023; prior CT scan of the abdomen and pelvis 08/03/2020   FINDINGS: CTA CHEST FINDINGS   Cardiovascular: Diffusely thickened and calcified aortic valve consistent with the clinical history of aortic valvular disease. No evidence of aneurysm or dissection. Four vessel arch anatomy. The left vertebral artery arises directly from the aorta. Scattered calcifications throughout the thoracic aorta. Mild cardiomegaly with left ventricular dilatation. Calcifications present along the coronary  arteries. No pericardial effusion.   Mediastinum/Nodes: Unremarkable CT appearance of the thyroid  gland. No suspicious mediastinal or hilar adenopathy. No soft tissue mediastinal mass. The thoracic esophagus is unremarkable.   Lungs/Pleura: 1 cm right middle lobe pulmonary nodule again noted. No significant interval change compared to relatively recent prior imaging. Additional scattered small 2-3 mm pulmonary nodules without interval change. No focal airspace infiltrate, pleural effusion or pneumothorax. No pulmonary edema.   Musculoskeletal: No acute fracture or aggressive appearing lytic or blastic osseous lesion.   Review of the MIP images confirms the above findings.   CTA ABDOMEN AND PELVIS FINDINGS   VASCULAR   Aorta: Normal caliber aorta without aneurysm, dissection, vasculitis or significant stenosis. Calcified atherosclerotic plaque throughout.   Celiac: Patent without evidence of aneurysm, dissection, vasculitis or significant stenosis.   SMA: Patent without evidence of aneurysm, dissection, vasculitis or significant stenosis.   Renals: Both renal arteries are patent without evidence of aneurysm, dissection, vasculitis, fibromuscular dysplasia or significant stenosis.   IMA: Patent without evidence of aneurysm, dissection, vasculitis or significant stenosis.   Inflow: Robust inflow vessels. Minimal calcified atherosclerotic plaque. No significant tortuosity. No stenosis, occlusion or changes of fibromuscular dysplasia. No dissection or aneurysm.   Veins: No obvious venous abnormality within the limitations of this arterial phase study.   Review of the MIP images confirms the above findings.   NON-VASCULAR   Hepatobiliary: No focal liver abnormality is seen. No gallstones, gallbladder wall thickening, or biliary dilatation.   Pancreas: No inflammatory stranding around the pancreas. There is an enhancing nodule abutting or exophytic from the tail of the  pancreas measuring 1.7 x 1.3 cm (see image 123 of series 4). This is unchanged in size and appearance compared to the prior study from June of 2022. Greater than 2 years of stability is highly consistent with a benign process. This almost certainly represents a small splenule. No further imaging follow-up is recommended.   Spleen: Normal in size without focal abnormality.   Adrenals/Urinary Tract: Normal adrenal glands. No hydronephrosis, nephrolithiasis or  enhancing renal mass. Multiple low-attenuation lesions exophytic from the kidneys consistent with simple cysts. No imaging follow-up is recommended. The ureters and bladder are unremarkable.   Stomach/Bowel: There are a few scattered colonic diverticula but no evidence of active diverticulitis. Normal appendix in the right lower quadrant. No focal bowel wall thickening or evidence of obstruction.   Lymphatic: No suspicious lymphadenopathy.   Reproductive: Globular fibroid uterus with dystrophic calcifications. No adnexal masses.   Other: No abdominal wall hernia or abnormality. No abdominopelvic ascites.   Musculoskeletal: No acute fracture or aggressive appearing lytic or blastic osseous lesion.   Review of the MIP images confirms the above findings.   IMPRESSION: 1. No acute abnormality within the chest, abdomen or pelvis. 2. Thickened and calcified aortic valve consistent with the clinical history of aortic valvular disease. 3. No evidence of access vessel abnormality or aortic abnormality that would prevent or complicate TAVR. 4. Scattered aortic and coronary artery atherosclerotic vascular plaque. 5. No interval change in the size or appearance of the 1 cm right middle lobe pulmonary nodule. Agree with prior recommendations of pulmonary neoplasm workup with PET-CT as suggested on the CT scan of the chest dated 01/16/2023. 6. Mild cardiomegaly with left ventricular dilatation. 7. Additional ancillary findings as  above.   Aortic Atherosclerosis (ICD10-I70.0).     Electronically Signed   By: Wilkie Lent M.D.   On: 04/15/2023 09:49      Impression:  This 68 year old woman has stage D, severe, symptomatic low-flow/low gradient aortic stenosis with NYHA class II symptoms of exertional fatigue and shortness of breath consistent with chronic diastolic congestive heart failure.  I have personally reviewed her 2D echocardiogram, cardiac catheterization, and CTA studies.  Her most recent echocardiogram shows a severely calcified and thickened aortic valve with restricted leaflet mobility.  The mean gradient was 24.5 mmHg with a valve area by VTI of 0.68 cm and dimensionless index of 0.24.  Stroke-volume index was low at 25 with a drop in her ejection fraction to 35 to 40% with global hypokinesis and grade 2 diastolic dysfunction.  Cardiac catheterization showed mild nonobstructive coronary disease with elevated right heart pressures.  I agree that aortic valve replacement is indicated in this patient for relief of her symptoms and to prevent progressive left ventricular dysfunction.  Given her age and comorbidities I think transcatheter aortic valve replacement would be a reasonable option for treating her.  She said that she is not really interested in having open surgery anyway.  Her gated cardiac CTA shows anatomy suitable for TAVR using a 23 mm SAPIEN 3 valve.  Her abdominal and pelvic CTA shows adequate pelvic vascular anatomy to allow transfemoral insertion.  The patient was counseled at length regarding treatment alternatives for management of severe symptomatic aortic stenosis. The risks and benefits of surgical intervention has been discussed in detail. Long-term prognosis with medical therapy was discussed. Alternative approaches such as conventional surgical aortic valve replacement, transcatheter aortic valve replacement, and palliative medical therapy were compared and contrasted at length. This  discussion was placed in the context of the patient's own specific clinical presentation and past medical history. All of their questions have been addressed.   Following the decision to proceed with transcatheter aortic valve replacement, a discussion was held regarding what types of management strategies would be attempted intraoperatively in the event of life-threatening complications, including whether or not the patient would be considered a candidate for the use of cardiopulmonary bypass and/or conversion to open sternotomy for attempted  surgical intervention.  I think she would be a candidate for emergent sternotomy to manage any intraoperative complications although it would be high risk due to significant calcification of her aortic root particular around the right coronary ostium.  The patient has been advised of a variety of complications that might develop including but not limited to risks of death, stroke, paravalvular leak, aortic dissection or other major vascular complications, aortic annulus rupture, device embolization, cardiac rupture or perforation, mitral regurgitation, acute myocardial infarction, arrhythmia, heart block or bradycardia requiring permanent pacemaker placement, congestive heart failure, respiratory failure, renal failure, pneumonia, infection, other late complications related to structural valve deterioration or migration, or other complications that might ultimately cause a temporary or permanent loss of functional independence or other long term morbidity. The patient provides full informed consent for the procedure as described and all questions were answered.    Plan:  She will be scheduled for transfemoral TAVR using a SAPIEN 3 valve on 02/20/2024.  I spent 45 minutes performing this consultation and > 50% of this time was spent face to face counseling and coordinating the care of this patient's severe aortic stenosis.  Dorise LOIS Fellers, MD 02/13/2024         [1]  Allergies Allergen Reactions   Jardiance  [Empagliflozin ] Anaphylaxis, Itching, Rash and Other (See Comments)    Yeast infection   Atorvastatin Other (See Comments)    REACTION: mylagias   Other Other (See Comments)    Product containing 3-hydroxy-3-methylglutaryl-coenzyme A reductase inhibitor (product)   Pravastatin Sodium Other (See Comments)    REACTION: myalgias   Sulfa Antibiotics Hives   "

## 2024-02-14 ENCOUNTER — Encounter: Admitting: Surgery

## 2024-02-14 ENCOUNTER — Other Ambulatory Visit (HOSPITAL_COMMUNITY): Payer: Self-pay

## 2024-02-16 ENCOUNTER — Ambulatory Visit (HOSPITAL_COMMUNITY)
Admission: RE | Admit: 2024-02-16 | Discharge: 2024-02-16 | Disposition: A | Discharge status: Home / Self Care | Source: Ambulatory Visit | Attending: Cardiovascular Disease | Admitting: Cardiovascular Disease

## 2024-02-16 ENCOUNTER — Ambulatory Visit: Payer: Self-pay

## 2024-02-16 ENCOUNTER — Other Ambulatory Visit: Payer: Self-pay

## 2024-02-16 ENCOUNTER — Encounter (HOSPITAL_COMMUNITY)
Admission: RE | Admit: 2024-02-16 | Discharge: 2024-02-16 | Disposition: A | Discharge status: Home / Self Care | Source: Ambulatory Visit | Attending: Cardiovascular Disease | Admitting: Cardiovascular Disease

## 2024-02-16 DIAGNOSIS — I350 Nonrheumatic aortic (valve) stenosis: Secondary | ICD-10-CM | POA: Diagnosis present

## 2024-02-16 DIAGNOSIS — Z01818 Encounter for other preprocedural examination: Secondary | ICD-10-CM | POA: Insufficient documentation

## 2024-02-16 LAB — CBC
HCT: 39.4 % (ref 36.0–46.0)
Hemoglobin: 13.1 g/dL (ref 12.0–15.0)
MCH: 31.5 pg (ref 26.0–34.0)
MCHC: 33.2 g/dL (ref 30.0–36.0)
MCV: 94.7 fL (ref 80.0–100.0)
Platelets: 312 K/uL (ref 150–400)
RBC: 4.16 MIL/uL (ref 3.87–5.11)
RDW: 13.5 % (ref 11.5–15.5)
WBC: 8.8 K/uL (ref 4.0–10.5)
nRBC: 0 % (ref 0.0–0.2)

## 2024-02-16 LAB — URINALYSIS, ROUTINE W REFLEX MICROSCOPIC
Bacteria, UA: NONE SEEN
Bilirubin Urine: NEGATIVE
Glucose, UA: NEGATIVE mg/dL
Hgb urine dipstick: NEGATIVE
Ketones, ur: NEGATIVE mg/dL
Nitrite: NEGATIVE
Protein, ur: NEGATIVE mg/dL
Specific Gravity, Urine: 1.027 (ref 1.005–1.030)
pH: 5 (ref 5.0–8.0)

## 2024-02-16 LAB — TYPE AND SCREEN
ABO/RH(D): O POS
Antibody Screen: NEGATIVE

## 2024-02-16 LAB — COMPREHENSIVE METABOLIC PANEL WITH GFR
ALT: 20 U/L (ref 0–44)
AST: 18 U/L (ref 15–41)
Albumin: 4.4 g/dL (ref 3.5–5.0)
Alkaline Phosphatase: 64 U/L (ref 38–126)
Anion gap: 11 (ref 5–15)
BUN: 17 mg/dL (ref 8–23)
CO2: 22 mmol/L (ref 22–32)
Calcium: 9.8 mg/dL (ref 8.9–10.3)
Chloride: 105 mmol/L (ref 98–111)
Creatinine, Ser: 0.74 mg/dL (ref 0.44–1.00)
GFR, Estimated: >60 mL/min
Glucose, Bld: 104 mg/dL — ABNORMAL HIGH (ref 70–99)
Potassium: 4.1 mmol/L (ref 3.5–5.1)
Sodium: 139 mmol/L (ref 135–145)
Total Bilirubin: 0.4 mg/dL (ref 0.0–1.2)
Total Protein: 6.9 g/dL (ref 6.5–8.1)

## 2024-02-16 LAB — SURGICAL PCR SCREEN
MRSA, PCR: NEGATIVE
Staphylococcus aureus: NEGATIVE

## 2024-02-16 LAB — PROTIME-INR
INR: 1.1 (ref 0.8–1.2)
Prothrombin Time: 14.8 s (ref 11.4–15.2)

## 2024-02-16 NOTE — Progress Notes (Signed)
 All consents signed by patient at PAT lab appointment. Pt was sent home with printed copy of surgical instructions and CHG soap/CHG soap instructions. All instructions reviewed with patient and questions answered.  Patients chart send to anesthesia for review. Pt denies any respiratory illness/infection in the last two months.

## 2024-02-19 ENCOUNTER — Encounter (HOSPITAL_COMMUNITY): Payer: Self-pay | Admitting: Cardiovascular Disease

## 2024-02-19 MED ORDER — CEFAZOLIN SODIUM-DEXTROSE 2-4 GM/100ML-% IV SOLN
2.0000 g | INTRAVENOUS | Status: AC
  Administered 2024-02-20: 2 g via INTRAVENOUS
  Filled 2024-02-19 (×2): qty 100

## 2024-02-19 MED ORDER — DEXMEDETOMIDINE HCL IN NACL 400 MCG/100ML IV SOLN
0.1000 ug/kg/h | INTRAVENOUS | Status: AC
  Administered 2024-02-20: 49.2 ug via INTRAVENOUS
  Administered 2024-02-20: 1 ug/kg/h via INTRAVENOUS
  Filled 2024-02-19: qty 100

## 2024-02-19 MED ORDER — MAGNESIUM SULFATE 50 % IJ SOLN
40.0000 meq | INTRAMUSCULAR | Status: DC
  Filled 2024-02-19: qty 9.85

## 2024-02-19 MED ORDER — HEPARIN 30,000 UNITS/1000 ML (OHS) CELLSAVER SOLUTION
Status: DC
  Filled 2024-02-19: qty 1000

## 2024-02-19 MED ORDER — NOREPINEPHRINE 4 MG/250ML-% IV SOLN
0.0000 ug/min | INTRAVENOUS | Status: AC
  Administered 2024-02-20: 2 ug/min via INTRAVENOUS
  Filled 2024-02-19: qty 250

## 2024-02-19 MED ORDER — POTASSIUM CHLORIDE 2 MEQ/ML IV SOLN
80.0000 meq | INTRAVENOUS | Status: DC
  Filled 2024-02-19: qty 40

## 2024-02-19 NOTE — H&P (Signed)
 "  [address], [place] [address]             [phone]     CARDIOTHORACIC SURGERY ADMISSION HISTORY AND PHYSICAL  PCP is [name], [name], PA-C Referring Provider is [name], MD Primary Cardiologist is [name], MD   Reason for admission:  severe aortic stenosis   HPI:   The patient is a 69 year old woman with history of type 2 diabetes, hyperlipidemia, hypertension, OSA on CPAP, remote smoking, history of breast cancer status postlumpectomy, history of nonischemic cardiomyopathy with chronic combined systolic and diastolic congestive heart failure, morbid obesity (BMI 37) and severe aortic stenosis who was referred for consideration of TAVR vs open surgical aortic valve replacement.  She was seen in the structural heart clinic in February 2025 for workup of her aortic stenosis.  She had a history of reduced ejection fraction of 30% dating back to 2015.  Cardiac catheterization at that time showed no evidence of coronary disease.  She has been followed by cardiology with moderate aortic stenosis.  Echocardiogram in January 2025 showed thickened and calcified aortic valve leaflets with a mean gradient of 31 mmHg, AVA of 0.79 cm, dimensionless index of 0.26, and low stroke-volume index of 32 consistent with severe low-flow/low gradient aortic stenosis.  Left ventricular ejection fraction was mildly decreased at 45% with global hypokinesis and grade 2 diastolic dysfunction.  She was asymptomatic at that time and continued follow-up was recommended.  TAVR workup with CT scans was performed and February 2025.  A follow-up echocardiogram on 06/29/2023 showed a mean gradient across the aortic valve of 32 mmHg with a valve area of 0.78 cm.  Left ventricular ejection fraction was 45%.  She recently saw Dr. Cash in the office on 01/19/2024.  Her most recent echo on 01/02/2024 was essentially unchanged with a mean gradient of 24.5 mmHg, AVA of 0.68 cm, and  dimensionless index of 0.24.  Stroke-volume index was reduced to 25 and ejection fraction fell further to 35 to 40% with global hypokinesis and mild dilation.   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  She is hopeful that she can return to work after valve replacement.  She says that she feels well overall with mild dyspnea on exertion and fatigue.  She says she is able to walk about 30 minutes on a treadmill on level ground.  She denies any dizziness or syncope.  She denies any peripheral edema.  She has had no chest pain or pressure.       Past Medical History:  Diagnosis Date   Anxiety     Aortic stenosis, mod to severe      echo 2017   Arthritis     Breast cancer (HCC) 1996    left breast   Carpal tunnel syndrome     Cataract     Chronic diastolic CHF (congestive heart failure) (HCC)      NYHA class II   COVID-19      12/02/21   Diabetes mellitus without complication (HCC)      type 2   Hyperlipidemia     Hypertension     Left bundle branch block (LBBB)     Low back pain     Neuropathy     NICM (nonischemic cardiomyopathy) (HCC)      EF 30%, cath 05/20/2015 clean coronary.  EF  resolved by echo with EF 55%   OSA on CPAP     Prediabetes     Pulmonary HTN (HCC) 05/2015    Severe with PASP 67/8mmHg - resolved on followup echo   PVC's (premature ventricular contractions) 11/04/2015   Sleep apnea      cpap               Past Surgical History:  Procedure Laterality Date   CARDIAC CATHETERIZATION N/A 05/20/2015    Procedure: Right/Left Heart Cath and Coronary Angiography;  Surgeon: Debby DELENA Sor, MD;  Location: [facility];  Service: Cardiovascular;  Laterality: N/A;   CATARACT EXTRACTION W/ INTRAOCULAR LENS  IMPLANT, BILATERAL       COLONOSCOPY   2018   COLONOSCOPY N/A 08/24/2023    Procedure: COLONOSCOPY;  Surgeon: Leigh Elspeth SQUIBB, MD;  Location: [facility];   Service: Gastroenterology;  Laterality: N/A;   DILATION AND CURETTAGE OF UTERUS       lumpectomy left breast       POLYPECTOMY       RIGHT HEART CATH AND CORONARY ANGIOGRAPHY N/A 01/31/2024    Procedure: RIGHT HEART CATH AND CORONARY ANGIOGRAPHY;  Surgeon: Verlin Lonni BIRCH, MD;  Location: [facility];  Service: Cardiovascular;  Laterality: N/A;   UTERINE FIBROID SURGERY                   Family History  Problem Relation Age of Onset   Arthritis Mother     Depression Mother     Hearing loss Mother     Hyperlipidemia Mother     Miscarriages / Stillbirths Mother     Colon cancer Mother 80        2010   Colon polyps Mother     Alcohol abuse Father     Cancer Father          PROSTATE   Depression Father     Hypertension Father     Hyperlipidemia Father     Drug abuse Brother     Early death Maternal Grandmother     Cancer Paternal Grandmother     Heart attack Neg Hx     Diabetes Neg Hx     Esophageal cancer Neg Hx     Rectal cancer Neg Hx     Stomach cancer Neg Hx            Social History         Socioeconomic History   Marital status: Significant Other      Spouse name: Not on file   Number of children: 0   Years of education: Not on file   Highest education level: Not on file  Occupational History   Occupation: Truck Hospital Doctor   Occupation: Environmental Manager  Tobacco Use   Smoking status: Former      Current packs/day: 0.00      Types: Cigarettes      Quit date: 06/18/2009      Years since quitting: 14.6   Smokeless tobacco: Never  Vaping Use   Vaping status: Never Used  Substance and Sexual Activity   Alcohol use: Not Currently      Comment: once every 2 months   Drug use: No   Sexual activity: Not Currently  Other Topics Concern   Not on file  Social History Narrative    ** Merged History Encounter **         Social Drivers of Health  Tobacco Use: Medium Risk (02/13/2024)    Patient History     Smoking Tobacco Use: Former      Smokeless Tobacco Use: Never     Passive Exposure: Not on file  Financial Resource Strain: Low Risk (06/28/2023)    Overall Financial Resource Strain (CARDIA)     Difficulty of Paying Living Expenses: Not hard at all  Food Insecurity: No Food Insecurity (06/28/2023)    Hunger Vital Sign     Worried About Running Out of Food in the Last Year: Never true     Ran Out of Food in the Last Year: Never true  Transportation Needs: No Transportation Needs (06/28/2023)    PRAPARE - Therapist, Art (Medical): No     Lack of Transportation (Non-Medical): No  Physical Activity: Insufficiently Active (06/28/2023)    Exercise Vital Sign     Days of Exercise per Week: 3 days     Minutes of Exercise per Session: 20 min  Stress: No Stress Concern Present (06/28/2023)    [employer] of Occupational Health - Occupational Stress Questionnaire     Feeling of Stress : Only a little  Social Connections: Moderately Isolated (06/28/2023)    Social Connection and Isolation Panel     Frequency of Communication with Friends and Family: Three times a week     Frequency of Social Gatherings with Friends and Family: Three times a week     Attends Religious Services: More than 4 times per year     Active Member of Clubs or Organizations: No     Attends Banker Meetings: Never     Marital Status: Never married  Intimate Partner Violence: Not At Risk (06/28/2023)    Humiliation, Afraid, Rape, and Kick questionnaire     Fear of Current or Ex-Partner: No     Emotionally Abused: No     Physically Abused: No     Sexually Abused: No  Depression (PHQ2-9): Low Risk (08/30/2023)    Depression (PHQ2-9)     PHQ-2 Score: 0  Alcohol Screen: Low Risk (06/28/2023)    Alcohol Screen     Last Alcohol Screening Score (AUDIT): 2  Housing: Unknown (06/28/2023)    Housing Stability Vital Sign     Unable to Pay for Housing in the Last Year: No     Number of Times Moved in the Last Year:  Not on file     Homeless in the Last Year: No  Utilities: Not At Risk (06/28/2023)    AHC Utilities     Threatened with loss of utilities: No  Health Literacy: Adequate Health Literacy (06/28/2023)    B1300 Health Literacy     Frequency of need for help with medical instructions: Never             Prior to Admission medications  Medication Sig Start Date End Date Taking? Authorizing Provider  Accu-Chek Softclix Lancets lancets USE 1 STRIP TO CHECK BLOOD SUGAR 2-3 TIMES DAILY 11/29/23   Yes Thapa, Sudan, MD  acetaminophen  (TYLENOL ) 650 MG CR tablet Take 1,300 mg by mouth as needed for pain.     Yes [provider]  albuterol  (VENTOLIN  HFA) 108 (90 Base) MCG/ACT inhaler SMARTSIG:2 Puff(s) By Mouth Every 6 Hours PRN 08/30/23   Yes [provider]  aspirin  81 MG tablet Take 81 mg by mouth daily.     Yes [provider]  Blood Glucose Monitoring Suppl (ACCU-CHEK GUIDE) w/Device KIT USE TO  CHECK BLOOD SUGAR 2-3 TIMES DAILY 11/29/23   Yes Thapa, Sudan, MD  carvedilol  (COREG ) 6.25 MG tablet Take 1 tablet (6.25 mg total) by mouth 2 (two) times daily with a meal. 08/14/23   Yes Turner, Traci R, MD  clotrimazole  (LOTRIMIN ) 1 % cream Apply 1 Application topically 2 (two) times daily. 12/02/22   Yes [name], [name], PA-C  Cyanocobalamin  (VITAMIN B 12 PO) Take by mouth.     Yes [provider]  diclofenac  Sodium (VOLTAREN ) 1 % GEL Apply 4 g topically 4 (four) times daily. Arthritis 12/02/22   Yes [name], [name], PA-C  diphenhydrAMINE HCl (BENADRYL ALLERGY PO) Take 1 tablet by mouth as needed.     Yes [provider]  ezetimibe  (ZETIA ) 10 MG tablet Take 1 tablet (10 mg total) by mouth daily. 08/14/23   Yes Turner, [name], MD  furosemide  (LASIX ) 40 MG tablet TAKE 1 TO 2 TABLETS BY MOUTH ONCE DAILY AS DIRECTED 08/17/23   Yes Turner, Traci R, MD  glucose blood (ACCU-CHEK GUIDE TEST) test strip USE TO CHECK BLOOD SUGAR 2-3 TIMES DAILY 11/29/23   Yes Thapa, [place],  MD  metFORMIN  (GLUCOPHAGE -XR) 500 MG 24 hr tablet 3 tabs qd 01/26/24   Yes Thapa, Sudan, MD  Multiple Vitamin (MULTIVITAMIN) tablet Take 1 tablet by mouth daily.     Yes [provider]  pregabalin  (LYRICA ) 50 MG capsule Take 1 capsule by mouth at bedtime 02/12/24   Yes [name], [name], PA-C  rosuvastatin  (CRESTOR ) 5 MG tablet Take 1 tablet by mouth once daily 11/30/23   Yes [name], [name], PA-C  sacubitril -valsartan  (ENTRESTO ) 24-26 MG Take 1 tablet by mouth 2 (two) times daily. 11/14/23   Yes Weaver, [name], PA-C  Semaglutide ,0.25 or 0.5MG /DOS, (OZEMPIC , 0.25 OR 0.5 MG/DOSE,) 2 MG/3ML SOPN Take 2.5 mg weekly for 4 weeks and increase to 0.5 mg weekly. 01/26/24   Yes Thapa, [place], MD  spironolactone  (ALDACTONE ) 25 MG tablet Take 1/2 (one-half) tablet by mouth once daily 09/18/23   Yes Turner, [name], MD  vitamin C (ASCORBIC ACID) 500 MG tablet Take 2,000 mg by mouth as needed.      Yes [provider]            Current Outpatient Medications  Medication Sig Dispense Refill   Accu-Chek Softclix Lancets lancets USE 1 STRIP TO CHECK BLOOD SUGAR 2-3 TIMES DAILY 100 each 12   acetaminophen  (TYLENOL ) 650 MG CR tablet Take 1,300 mg by mouth as needed for pain.       albuterol  (VENTOLIN  HFA) 108 (90 Base) MCG/ACT inhaler SMARTSIG:2 Puff(s) By Mouth Every 6 Hours PRN       aspirin  81 MG tablet Take 81 mg by mouth daily.       Blood Glucose Monitoring Suppl (ACCU-CHEK GUIDE) w/Device KIT USE TO CHECK BLOOD SUGAR 2-3 TIMES DAILY 1 kit 0   carvedilol  (COREG ) 6.25 MG tablet Take 1 tablet (6.25 mg total) by mouth 2 (two) times daily with a meal. 60 tablet 0   clotrimazole  (LOTRIMIN ) 1 % cream Apply 1 Application topically 2 (two) times daily. 60 g 0   Cyanocobalamin  (VITAMIN B 12 PO) Take by mouth.       diclofenac  Sodium (VOLTAREN ) 1 % GEL Apply 4 g topically 4 (four) times daily. Arthritis 350 g 1   diphenhydrAMINE HCl (BENADRYL ALLERGY PO) Take 1 tablet by mouth as needed.        ezetimibe  (ZETIA ) 10 MG tablet Take 1  tablet (10 mg total) by mouth daily. 30 tablet 0   furosemide  (LASIX ) 40 MG tablet TAKE 1 TO 2 TABLETS BY MOUTH ONCE DAILY AS DIRECTED 180 tablet 1   glucose blood (ACCU-CHEK GUIDE TEST) test strip USE TO CHECK BLOOD SUGAR 2-3 TIMES DAILY 300 each 12   metFORMIN  (GLUCOPHAGE -XR) 500 MG 24 hr tablet 3 tabs qd 270 tablet 3   Multiple Vitamin (MULTIVITAMIN) tablet Take 1 tablet by mouth daily.       pregabalin  (LYRICA ) 50 MG capsule Take 1 capsule by mouth at bedtime 30 capsule 2   rosuvastatin  (CRESTOR ) 5 MG tablet Take 1 tablet by mouth once daily 90 tablet 0   sacubitril -valsartan  (ENTRESTO ) 24-26 MG Take 1 tablet by mouth 2 (two) times daily. 180 tablet 2   Semaglutide ,0.25 or 0.5MG /DOS, (OZEMPIC , 0.25 OR 0.5 MG/DOSE,) 2 MG/3ML SOPN Take 2.5 mg weekly for 4 weeks and increase to 0.5 mg weekly. 3 mL 11   spironolactone  (ALDACTONE ) 25 MG tablet Take 1/2 (one-half) tablet by mouth once daily 45 tablet 3   vitamin C (ASCORBIC ACID) 500 MG tablet Take 2,000 mg by mouth as needed.           No current facility-administered medications for this visit.        [Allergies]  [Allergies]      Allergen Reactions   Jardiance  [Empagliflozin ] Anaphylaxis, Itching, Rash and Other (See Comments)      Yeast infection   Atorvastatin Other (See Comments)      REACTION: mylagias   Other Other (See Comments)      Product containing 3-hydroxy-3-methylglutaryl-coenzyme A reductase inhibitor (product)   Pravastatin Sodium Other (See Comments)      REACTION: myalgias   Sulfa Antibiotics Hives         Review of Systems:               General:                      normal appetite, + decreased energy, no weight gain, no weight loss, no fever             Cardiac:                       no chest pain with exertion, no chest pain at rest, + SOB with moderate exertion, no resting SOB, no PND, no orthopnea, no palpitations, no arrhythmia, no atrial fibrillation, no LE edema,  no dizzy spells, no syncope             Respiratory:                 + mild exertional shortness of breath, no home oxygen, no productive cough, no dry cough, no bronchitis, no wheezing, no hemoptysis, no asthma, no pain with inspiration or cough, + sleep apnea, + CPAP at night             GI:                               no difficulty swallowing, no reflux, no frequent heartburn, no hiatal hernia, no abdominal pain, no constipation, no diarrhea, no hematochezia, no hematemesis, no melena             GU:  no dysuria,  no frequency, no urinary tract infection, no hematuria,  no kidney stones, no kidney disease             Vascular:                     no pain suggestive of claudication, no pain in feet, no leg cramps, no varicose veins, no DVT, no non-healing foot ulcer             Neuro:                         no stroke, no TIA's, no seizures, no headaches, no temporary blindness one eye,  no slurred speech, no peripheral neuropathy, no chronic pain, no instability of gait, no memory/cognitive dysfunction             Musculoskeletal:         + arthritis - primarily involving the knees bilaterally, + joint swelling, no myalgias, no difficulty walking, normal mobility              Skin:                            no rash, no itching, no skin infections, no pressure sores or ulcerations             Psych:                         no anxiety, no depression, no nervousness, no unusual recent stress             Eyes:                           no blurry vision, no floaters, no recent vision changes, no glasses or contacts             ENT:                            no hearing loss, no loose or painful teeth, no dentures, last saw dentist recently             Hematologic:               no easy bruising, no abnormal bleeding, no clotting disorder, no frequent epistaxis             Endocrine:                   + diabetes, does check CBG's at home                            Physical  Exam:               BP 126/78 (BP Location: Right Arm, Patient Position: Sitting, Cuff Size: Large)   Pulse 87   Resp 20   Ht 5' 4 (1.626 m)   Wt 222 lb (100.7 kg)   SpO2 97% Comment: RA  BMI 38.11 kg/m              General:                      well-appearing             HEENT:  Unremarkable, NCAT, PERLA, EOMI             Neck:                           no JVD, no bruits, no adenopathy              Chest:                          clear to auscultation, symmetrical breath sounds, no wheezes, no rhonchi              CV:                              RRR, 3/6 systolic murmur RSB, no diastolic murmur             Abdomen:                    soft, non-tender, no masses              Extremities:                 warm, well-perfused, pedal pulses palpable, no lower extremity edema             Rectal/GU                   Deferred             Neuro:                         Grossly non-focal and symmetrical throughout             Skin:                            Clean and dry, no rashes, no breakdown   Diagnostic Tests:   ECHOCARDIOGRAM REPORT       Patient Name:   [name] [facility] Date of Exam: 01/02/2024  Medical Rec #:  [id number]      Height:       64.0 in  Accession #:    [id number]     Weight:       212.6 lb  Date of Birth:  Dec 11, 1955      BSA:          2.008 m  Patient Age:    68 years       BP:           122/68 mmHg  Patient Gender: F              HR:           68 bpm.  Exam Location:  [address]   Procedure: 2D Echo, Cardiac Doppler and Color Doppler (Both Spectral and  Color            Flow Doppler were utilized during procedure).   Indications:    I35.0 Aortic Stenosis    History:        Patient has prior history of Echocardiogram examinations,  most                 recent 06/29/2023. Arrythmias:PVC and LBBB,                  Signs/Symptoms:Murmur; Risk Factors:Diabetes, Hypertension  and                 Dyslipidemia. Obstructive sleep apnea-CPAP.  Nonischemic                  cardiomyopathy.    Sonographer:    Carl Rodgers-Jones RDCS  Referring Phys: [id number] CHRI[address] D MCALHANY   IMPRESSIONS     1. Left ventricular ejection fraction, by estimation, is 35 to 40%. The  left ventricle has moderately decreased function. The left ventricle  demonstrates global hypokinesis. The left ventricular internal cavity size  was mildly dilated. Left ventricular  diastolic parameters are consistent with Grade II diastolic dysfunction  (pseudonormalization). Elevated left ventricular end-diastolic pressure.  The E/e' is 19.   2. Right ventricular systolic function is normal. The right ventricular  size is normal.   3. The mitral valve is grossly normal. Trivial mitral valve  regurgitation.   4. Suspect low flow, low gradient severe AS. The aortic valve is  calcified. Aortic valve regurgitation is trivial. Moderate to severe  aortic valve stenosis. Aortic valve area, by VTI measures 0.68 cm. Aortic  valve mean gradient measures 24.5 mmHg.  Aortic valve Vmax measures 3.30 m/s. Peak gradient 43.6 mmHg, DI 0.24.   5. The inferior vena cava is normal in size with <50% respiratory  variability, suggesting right atrial pressure of 8 mmHg.   Comparison(s): Changes from prior study are noted. 06/29/2023: LVEF 45%,  severe AS - MG 32 mmHg.   Conclusion(s)/Recommendation(s): Compared to the prior study, the LVEF has  decreased, which may be associated with worsening severity of aortic  stenosis.   FINDINGS   Left Ventricle: Left ventricular ejection fraction, by estimation, is 35  to 40%. The left ventricle has moderately decreased function. The left  ventricle demonstrates global hypokinesis. The left ventricular internal  cavity size was mildly dilated.  There is no left ventricular hypertrophy. Abnormal (paradoxical) septal  motion, consistent with left bundle branch block. Left ventricular  diastolic parameters are consistent with Grade  II diastolic dysfunction  (pseudonormalization). Elevated left  ventricular end-diastolic pressure. The E/e' is 57.   Right Ventricle: The right ventricular size is normal. No increase in  right ventricular wall thickness. Right ventricular systolic function is  normal.   Left Atrium: Left atrial size was normal in size.   Right Atrium: Right atrial size was normal in size.   Pericardium: There is no evidence of pericardial effusion.   Mitral Valve: The mitral valve is grossly normal. Trivial mitral valve  regurgitation.   Tricuspid Valve: The tricuspid valve is grossly normal. Tricuspid valve  regurgitation is trivial.   Suspect low flow, low gradient severe AS. The aortic valve is calcified.  Aortic valve regurgitation is trivial. Moderate to severe aortic stenosis  is present.  Pulmonic Valve: The pulmonic valve was normal in structure. Pulmonic valve  regurgitation is not visualized.   Aorta: The aortic root and ascending aorta are structurally normal, with  no evidence of dilitation.   Venous: The inferior vena cava is normal in size with less than 50%  respiratory variability, suggesting right atrial pressure of 8 mmHg.   IAS/Shunts: No atrial level shunt detected by color flow Doppler.     LEFT VENTRICLE  PLAX 2D  LVIDd:         5.60 cm   Diastology  LVIDs:         4.50 cm   LV e' medial:    7.06 cm/s  LV PW:  0.90 cm   LV E/e' medial:  16.9  LV IVS:        0.60 cm   LV e' lateral:   6.22 cm/s  LVOT diam:     1.90 cm   LV E/e' lateral: 19.2  LV SV:         51  LV SV Index:   25  LVOT Area:     2.84 cm     RIGHT VENTRICLE  RV Basal diam:  3.50 cm  RV S prime:     12.83 cm/s  TAPSE (M-mode): 2.4 cm   LEFT ATRIUM             Index        RIGHT ATRIUM           Index  LA diam:        4.80 cm 2.39 cm/m   RA Area:     12.70 cm  LA Vol (A2C):   59.9 ml 29.83 ml/m  RA Volume:   30.10 ml  14.99 ml/m  LA Vol (A4C):   52.7 ml 26.25 ml/m  LA Biplane  Vol: 57.5 ml 28.64 ml/m   AORTIC VALVE  AV Area (Vmax):    0.67 cm  AV Area (Vmean):   0.64 cm  AV Area (VTI):     0.68 cm  AV Vmax:           330.00 cm/s  AV Vmean:          230.500 cm/s  AV VTI:            0.744 m  AV Peak Grad:      43.6 mmHg  AV Mean Grad:      24.5 mmHg  LVOT Vmax:         77.80 cm/s  LVOT Vmean:        52.050 cm/s  LVOT VTI:          0.178 m  LVOT/AV VTI ratio: 0.24    AORTA  Ao Root diam: 2.80 cm  Ao Asc diam:  3.00 cm   MITRAL VALVE  MV Area (PHT): 4.13 cm     SHUNTS  MV Decel Time: 184 msec     Systemic VTI:  0.18 m  MV E velocity: 119.50 cm/s  Systemic Diam: 1.90 cm  MV A velocity: 63.75 cm/s  MV E/A ratio:  1.87   Vinie Maxcy MD  Electronically signed by Vinie Maxcy MD  Signature Date/Time: 01/02/2024/12:50:50 PM        Final        rocedures   RIGHT HEART CATH AND CORONARY ANGIOGRAPHY    Conclusion       Prox RCA lesion is 20% stenosed.   Mild disease in the RCA No disease in the LAD or Circumflex Elevation right heart pressures     Hemodynamics   Pressures Phases Air rest  Right      RA Mean  mmHg 9    RA A-Wave  mmHg 13    RA V-Wave  mmHg 11    RV  mmHg 62/16  Pulmonary      PA  mmHg 59/24 (40)    PCW Mean  mmHg 21.0    PCW A-Wave  mmHg 24.0    PCW V-Wave  mmHg 22.0    PAPi   3.9  Aortic      AO  mmHg 111/58 (78)    API   2.52    Saturations  Phases Air rest    PA  % 57    Arterial  % 90    Hemo Data   Flowsheet Row Most Recent Value  Fick Cardiac Output 4.57 L/min  Fick Cardiac Output Index 2.26 (L/min)/BSA  RA A Wave 13 mmHg  RA V Wave 11 mmHg  RA Mean 9 mmHg  RV Systolic Pressure 62 mmHg  RV Diastolic Pressure 16 mmHg  RV EDP 16 mmHg  PA Systolic Pressure 59 mmHg  PA Diastolic Pressure 24 mmHg  PA Mean 40 mmHg  PW A Wave 24 mmHg  PW V Wave 22 mmHg  PW Mean 21 mmHg  AO Systolic Pressure 111 mmHg  AO Diastolic Pressure 58 mmHg  AO Mean 78 mmHg  QP/QS 1  TPVR Index 17.68 HRUI  TSVR  Index 34.48 HRUI  PVR SVR Ratio 0.28  TPVR/TSVR Ratio 0.51      Narrative & Impression  CLINICAL DATA:  Aortic Stenosis   EXAM: Cardiac TAVR CT   TECHNIQUE: The patient was scanned on a Siemens Force 192 slice scanner. A 120 kV retrospective scan was triggered in the ascending thoracic aorta at 140 HU's. Gantry rotation speed was 250 msecs and collimation was .6 mm. No beta blockade or nitro were given. The 3D data set was reconstructed in 5% intervals of the R-R cycle. Systolic and diastolic phases were analyzed on a dedicated work station using MPR, MIP and VRT modes. The patient received 80 cc of contrast.   FINDINGS: Aortic Valve: Tri leaflet AV with calcium  score only 751   Aorta: No aneurysm normal arch vessels moderate calcific atherosclerosis   Sino-tubular Junction: 24.3 mm calcified   Ascending Thoracic Aorta: 27.6 mm   Aortic Arch: 23 mm   Descending Thoracic Aorta: 22 mm   Sinus of Valsalva Measurements:   Non-coronary: 26.5 mm   height 18.9  mm   Right - coronary: 24.1 mm   height 19.6 mm   Left -   coronary: 26.3 mm   height 19.5 mm   Coronary Artery Height above Annulus:   Left Main: 14.9 mm above annulus   Right Coronary: 16.9 mm above annulus   Virtual Basal Annulus Measurements:   Maximum / Minimum Diameter: 23.9 mm x 20.2 mm Average diameter 21.9 mm   Perimeter: 69.7 mm   Area: 378 mm2   Coronary Arteries: Sufficient height above annulus for deployment   Optimum Fluoroscopic Angle for Delivery: RAO 16 Caudal 3 degrees   IMPRESSION: 1. Tri leaflet AV with calcium  score only 751   2. Annular area of 378 mm2 suitable for a 23 mm Sapien 3 valve Sinuses small for 26 mm Medtronic valve   3.  Coronary arteries sufficient height above annulus for deployment   4. Optimum angiographic angle for deployment RAO 16 Caudal 3 degrees   5.  Membranous septal length 8.7 mm   [name]   Electronically Signed: By: [name] M.D. On:  04/03/2023 13:23        Narrative & Impression  CLINICAL DATA:  Aortic valvular disease.  Evaluation prior to TAVR   EXAM: CT ANGIOGRAPHY CHEST, ABDOMEN AND PELVIS   TECHNIQUE: Non-contrast CT of the chest was initially obtained.   Multidetector CT imaging through the chest, abdomen and pelvis was performed using the standard protocol during bolus administration of intravenous contrast. Multiplanar reconstructed images and MIPs were obtained and reviewed to evaluate the vascular anatomy.   RADIATION DOSE REDUCTION: This exam was performed according to  the departmental dose-optimization program which includes automated exposure control, adjustment of the mA and/or kV according to patient size and/or use of iterative reconstruction technique.   CONTRAST:  95 mL OMNIPAQUE  IOHEXOL  350 MG/ML SOLN   COMPARISON:  Prior CT scan of the chest 01/16/2023; prior CT scan of the abdomen and pelvis 08/03/2020   FINDINGS: CTA CHEST FINDINGS   Cardiovascular: Diffusely thickened and calcified aortic valve consistent with the clinical history of aortic valvular disease. No evidence of aneurysm or dissection. Four vessel arch anatomy. The left vertebral artery arises directly from the aorta. Scattered calcifications throughout the thoracic aorta. Mild cardiomegaly with left ventricular dilatation. Calcifications present along the coronary arteries. No pericardial effusion.   Mediastinum/Nodes: Unremarkable CT appearance of the thyroid  gland. No suspicious mediastinal or hilar adenopathy. No soft tissue mediastinal mass. The thoracic esophagus is unremarkable.   Lungs/Pleura: 1 cm right middle lobe pulmonary nodule again noted. No significant interval change compared to relatively recent prior imaging. Additional scattered small 2-3 mm pulmonary nodules without interval change. No focal airspace infiltrate, pleural effusion or pneumothorax. No pulmonary edema.   Musculoskeletal: No acute  fracture or aggressive appearing lytic or blastic osseous lesion.   Review of the MIP images confirms the above findings.   CTA ABDOMEN AND PELVIS FINDINGS   VASCULAR   Aorta: Normal caliber aorta without aneurysm, dissection, vasculitis or significant stenosis. Calcified atherosclerotic plaque throughout.   Celiac: Patent without evidence of aneurysm, dissection, vasculitis or significant stenosis.   SMA: Patent without evidence of aneurysm, dissection, vasculitis or significant stenosis.   Renals: Both renal arteries are patent without evidence of aneurysm, dissection, vasculitis, fibromuscular dysplasia or significant stenosis.   IMA: Patent without evidence of aneurysm, dissection, vasculitis or significant stenosis.   Inflow: Robust inflow vessels. Minimal calcified atherosclerotic plaque. No significant tortuosity. No stenosis, occlusion or changes of fibromuscular dysplasia. No dissection or aneurysm.   Veins: No obvious venous abnormality within the limitations of this arterial phase study.   Review of the MIP images confirms the above findings.   NON-VASCULAR   Hepatobiliary: No focal liver abnormality is seen. No gallstones, gallbladder wall thickening, or biliary dilatation.   Pancreas: No inflammatory stranding around the pancreas. There is an enhancing nodule abutting or exophytic from the tail of the pancreas measuring 1.7 x 1.3 cm (see image 123 of series 4). This is unchanged in size and appearance compared to the prior study from June of 2022. Greater than 2 years of stability is highly consistent with a benign process. This almost certainly represents a small splenule. No further imaging follow-up is recommended.   Spleen: Normal in size without focal abnormality.   Adrenals/Urinary Tract: Normal adrenal glands. No hydronephrosis, nephrolithiasis or enhancing renal mass. Multiple low-attenuation lesions exophytic from the kidneys consistent with  simple cysts. No imaging follow-up is recommended. The ureters and bladder are unremarkable.   Stomach/Bowel: There are a few scattered colonic diverticula but no evidence of active diverticulitis. Normal appendix in the right lower quadrant. No focal bowel wall thickening or evidence of obstruction.   Lymphatic: No suspicious lymphadenopathy.   Reproductive: Globular fibroid uterus with dystrophic calcifications. No adnexal masses.   Other: No abdominal wall hernia or abnormality. No abdominopelvic ascites.   Musculoskeletal: No acute fracture or aggressive appearing lytic or blastic osseous lesion.   Review of the MIP images confirms the above findings.   IMPRESSION: 1. No acute abnormality within the chest, abdomen or pelvis. 2. Thickened and calcified aortic valve consistent  with the clinical history of aortic valvular disease. 3. No evidence of access vessel abnormality or aortic abnormality that would prevent or complicate TAVR. 4. Scattered aortic and coronary artery atherosclerotic vascular plaque. 5. No interval change in the size or appearance of the 1 cm right middle lobe pulmonary nodule. Agree with prior recommendations of pulmonary neoplasm workup with PET-CT as suggested on the CT scan of the chest dated 01/16/2023. 6. Mild cardiomegaly with left ventricular dilatation. 7. Additional ancillary findings as above.   Aortic Atherosclerosis (ICD10-I70.0).     Electronically Signed   By: Wilkie Lent M.D.   On: 04/15/2023 09:49        Impression:   This 69 year old woman has stage D, severe, symptomatic low-flow/low gradient aortic stenosis with NYHA class II symptoms of exertional fatigue and shortness of breath consistent with chronic diastolic congestive heart failure.  I have personally reviewed her 2D echocardiogram, cardiac catheterization, and CTA studies.  Her most recent echocardiogram shows a severely calcified and thickened aortic valve with  restricted leaflet mobility.  The mean gradient was 24.5 mmHg with a valve area by VTI of 0.68 cm and dimensionless index of 0.24.  Stroke-volume index was low at 25 with a drop in her ejection fraction to 35 to 40% with global hypokinesis and grade 2 diastolic dysfunction.  Cardiac catheterization showed mild nonobstructive coronary disease with elevated right heart pressures.  I agree that aortic valve replacement is indicated in this patient for relief of her symptoms and to prevent progressive left ventricular dysfunction.  Given her age and comorbidities I think transcatheter aortic valve replacement would be a reasonable option for treating her.  She said that she is not really interested in having open surgery anyway.  Her gated cardiac CTA shows anatomy suitable for TAVR using a 23 mm SAPIEN 3 valve.  Her abdominal and pelvic CTA shows adequate pelvic vascular anatomy to allow transfemoral insertion.   The patient was counseled at length regarding treatment alternatives for management of severe symptomatic aortic stenosis. The risks and benefits of surgical intervention has been discussed in detail. Long-term prognosis with medical therapy was discussed. Alternative approaches such as conventional surgical aortic valve replacement, transcatheter aortic valve replacement, and palliative medical therapy were compared and contrasted at length. This discussion was placed in the context of the patient's own specific clinical presentation and past medical history. All of their questions have been addressed.    Following the decision to proceed with transcatheter aortic valve replacement, a discussion was held regarding what types of management strategies would be attempted intraoperatively in the event of life-threatening complications, including whether or not the patient would be considered a candidate for the use of cardiopulmonary bypass and/or conversion to open sternotomy for attempted surgical  intervention.  I think she would be a candidate for emergent sternotomy to manage any intraoperative complications although it would be high risk due to significant calcification of her aortic root particular around the right coronary ostium.  The patient has been advised of a variety of complications that might develop including but not limited to risks of death, stroke, paravalvular leak, aortic dissection or other major vascular complications, aortic annulus rupture, device embolization, cardiac rupture or perforation, mitral regurgitation, acute myocardial infarction, arrhythmia, heart block or bradycardia requiring permanent pacemaker placement, congestive heart failure, respiratory failure, renal failure, pneumonia, infection, other late complications related to structural valve deterioration or migration, or other complications that might ultimately cause a temporary or permanent loss of functional independence  or other long term morbidity. The patient provides full informed consent for the procedure as described and all questions were answered.    Plan:   Transfemoral TAVR using a SAPIEN 3 valve.    Dorise LOIS Fellers, MD "

## 2024-02-20 ENCOUNTER — Inpatient Hospital Stay (HOSPITAL_COMMUNITY): Admitting: Anesthesiology

## 2024-02-20 ENCOUNTER — Encounter (HOSPITAL_COMMUNITY): Admission: RE | Disposition: A | Payer: Self-pay | Source: Home / Self Care | Attending: Cardiovascular Disease

## 2024-02-20 ENCOUNTER — Other Ambulatory Visit: Payer: Self-pay

## 2024-02-20 ENCOUNTER — Inpatient Hospital Stay (HOSPITAL_COMMUNITY): Admitting: Physician Assistant

## 2024-02-20 ENCOUNTER — Encounter (HOSPITAL_COMMUNITY): Payer: Self-pay | Admitting: Cardiovascular Disease

## 2024-02-20 ENCOUNTER — Inpatient Hospital Stay (HOSPITAL_COMMUNITY)

## 2024-02-20 ENCOUNTER — Inpatient Hospital Stay (HOSPITAL_COMMUNITY)
Admission: RE | Admit: 2024-02-20 | Discharge: 2024-02-21 | DRG: 266 | Disposition: A | Discharge status: Home / Self Care | Attending: Surgery | Admitting: Surgery

## 2024-02-20 DIAGNOSIS — I5023 Acute on chronic systolic (congestive) heart failure: Secondary | ICD-10-CM | POA: Diagnosis present

## 2024-02-20 DIAGNOSIS — G4733 Obstructive sleep apnea (adult) (pediatric): Secondary | ICD-10-CM | POA: Diagnosis present

## 2024-02-20 DIAGNOSIS — E7800 Pure hypercholesterolemia, unspecified: Secondary | ICD-10-CM | POA: Diagnosis present

## 2024-02-20 DIAGNOSIS — I350 Nonrheumatic aortic (valve) stenosis: Secondary | ICD-10-CM

## 2024-02-20 DIAGNOSIS — I428 Other cardiomyopathies: Secondary | ICD-10-CM | POA: Diagnosis present

## 2024-02-20 DIAGNOSIS — E119 Type 2 diabetes mellitus without complications: Secondary | ICD-10-CM

## 2024-02-20 DIAGNOSIS — Z006 Encounter for examination for normal comparison and control in clinical research program: Secondary | ICD-10-CM | POA: Diagnosis not present

## 2024-02-20 DIAGNOSIS — Z800 Family history of malignant neoplasm of digestive organs: Secondary | ICD-10-CM

## 2024-02-20 DIAGNOSIS — Z8261 Family history of arthritis: Secondary | ICD-10-CM | POA: Diagnosis not present

## 2024-02-20 DIAGNOSIS — Z813 Family history of other psychoactive substance abuse and dependence: Secondary | ICD-10-CM | POA: Diagnosis not present

## 2024-02-20 DIAGNOSIS — Z853 Personal history of malignant neoplasm of breast: Secondary | ICD-10-CM | POA: Diagnosis not present

## 2024-02-20 DIAGNOSIS — E1142 Type 2 diabetes mellitus with diabetic polyneuropathy: Secondary | ICD-10-CM | POA: Diagnosis present

## 2024-02-20 DIAGNOSIS — F32A Depression, unspecified: Secondary | ICD-10-CM | POA: Diagnosis present

## 2024-02-20 DIAGNOSIS — Z882 Allergy status to sulfonamides status: Secondary | ICD-10-CM

## 2024-02-20 DIAGNOSIS — I447 Left bundle-branch block, unspecified: Secondary | ICD-10-CM | POA: Diagnosis present

## 2024-02-20 DIAGNOSIS — Z83438 Family history of other disorder of lipoprotein metabolism and other lipidemia: Secondary | ICD-10-CM | POA: Diagnosis not present

## 2024-02-20 DIAGNOSIS — Z7985 Long-term (current) use of injectable non-insulin antidiabetic drugs: Secondary | ICD-10-CM | POA: Diagnosis not present

## 2024-02-20 DIAGNOSIS — I110 Hypertensive heart disease with heart failure: Secondary | ICD-10-CM | POA: Diagnosis present

## 2024-02-20 DIAGNOSIS — Z8249 Family history of ischemic heart disease and other diseases of the circulatory system: Secondary | ICD-10-CM

## 2024-02-20 DIAGNOSIS — Z7982 Long term (current) use of aspirin: Secondary | ICD-10-CM | POA: Diagnosis not present

## 2024-02-20 DIAGNOSIS — Z811 Family history of alcohol abuse and dependence: Secondary | ICD-10-CM | POA: Diagnosis not present

## 2024-02-20 DIAGNOSIS — Z87891 Personal history of nicotine dependence: Secondary | ICD-10-CM

## 2024-02-20 DIAGNOSIS — Z952 Presence of prosthetic heart valve: Secondary | ICD-10-CM

## 2024-02-20 DIAGNOSIS — I272 Pulmonary hypertension, unspecified: Secondary | ICD-10-CM | POA: Diagnosis present

## 2024-02-20 DIAGNOSIS — Z79899 Other long term (current) drug therapy: Secondary | ICD-10-CM

## 2024-02-20 DIAGNOSIS — Z822 Family history of deafness and hearing loss: Secondary | ICD-10-CM | POA: Diagnosis not present

## 2024-02-20 DIAGNOSIS — Z888 Allergy status to other drugs, medicaments and biological substances status: Secondary | ICD-10-CM

## 2024-02-20 DIAGNOSIS — Z818 Family history of other mental and behavioral disorders: Secondary | ICD-10-CM

## 2024-02-20 HISTORY — PX: INTRAOPERATIVE TRANSTHORACIC ECHOCARDIOGRAM: SHX6523

## 2024-02-20 HISTORY — DX: Nonrheumatic aortic (valve) stenosis: I35.0

## 2024-02-20 LAB — ECHOCARDIOGRAM LIMITED
AR max vel: 2.52 cm2
AV Area VTI: 2.8 cm2
AV Area mean vel: 2.44 cm2
AV Mean grad: 4 mmHg
AV Peak grad: 8.1 mmHg
Ao pk vel: 1.42 m/s
S' Lateral: 4.3 cm
Single Plane A4C EF: 33.3 %

## 2024-02-20 LAB — POCT I-STAT, CHEM 8
BUN: 16 mg/dL (ref 8–23)
Calcium, Ion: 1.35 mmol/L (ref 1.15–1.40)
Chloride: 103 mmol/L (ref 98–111)
Creatinine, Ser: 0.6 mg/dL (ref 0.44–1.00)
Glucose, Bld: 135 mg/dL — ABNORMAL HIGH (ref 70–99)
HCT: 34 % — ABNORMAL LOW (ref 36.0–46.0)
Hemoglobin: 11.6 g/dL — ABNORMAL LOW (ref 12.0–15.0)
Potassium: 3.7 mmol/L (ref 3.5–5.1)
Sodium: 141 mmol/L (ref 135–145)
TCO2: 23 mmol/L (ref 22–32)

## 2024-02-20 LAB — GLUCOSE, CAPILLARY
Glucose-Capillary: 123 mg/dL — ABNORMAL HIGH (ref 70–99)
Glucose-Capillary: 105 mg/dL — ABNORMAL HIGH (ref 70–99)
Glucose-Capillary: 104 mg/dL — ABNORMAL HIGH (ref 70–99)
Glucose-Capillary: 141 mg/dL — ABNORMAL HIGH (ref 70–99)

## 2024-02-20 LAB — ABO/RH: ABO/RH(D): O POS

## 2024-02-20 LAB — POCT ACTIVATED CLOTTING TIME: Activated Clotting Time: 261 s

## 2024-02-20 MED ORDER — CHLORHEXIDINE GLUCONATE 0.12 % MT SOLN
15.0000 mL | Freq: Once | OROMUCOSAL | Status: AC
  Administered 2024-02-20: 15 mL via OROMUCOSAL
  Filled 2024-02-20: qty 15

## 2024-02-20 MED ORDER — FUROSEMIDE 10 MG/ML IJ SOLN
60.0000 mg | Freq: Once | INTRAMUSCULAR | Status: AC
  Administered 2024-02-20: 60 mg via INTRAVENOUS
  Filled 2024-02-20: qty 6

## 2024-02-20 MED ORDER — HEPARIN (PORCINE) IN NACL 2000-0.9 UNIT/L-% IV SOLN
INTRAVENOUS | Status: DC | PRN
  Administered 2024-02-20: 1000 mL

## 2024-02-20 MED ORDER — EZETIMIBE 10 MG PO TABS
10.0000 mg | ORAL_TABLET | Freq: Every day | ORAL | Status: DC
  Administered 2024-02-20: 10 mg via ORAL
  Filled 2024-02-20: qty 1

## 2024-02-20 MED ORDER — ONDANSETRON HCL 4 MG/2ML IJ SOLN: 4.0000 mg | Freq: Four times a day (QID) | INTRAMUSCULAR | Status: DC | PRN

## 2024-02-20 MED ORDER — NOREPINEPHRINE 4 MG/250ML-% IV SOLN
0.0000 ug/min | INTRAVENOUS | Status: DC
  Filled 2024-02-20: qty 250

## 2024-02-20 MED ORDER — EPHEDRINE SULFATE-NACL 50-0.9 MG/10ML-% IV SOSY: PREFILLED_SYRINGE | INTRAVENOUS | Status: DC | PRN

## 2024-02-20 MED ORDER — SODIUM CHLORIDE 0.9 % IV SOLN: INTRAVENOUS | Status: AC

## 2024-02-20 MED ORDER — FENTANYL CITRATE (PF) 100 MCG/2ML IJ SOLN
INTRAMUSCULAR | Status: AC
  Filled 2024-02-20: qty 2

## 2024-02-20 MED ORDER — ACETAMINOPHEN 650 MG RE SUPP: 650.0000 mg | Freq: Four times a day (QID) | RECTAL | Status: DC | PRN

## 2024-02-20 MED ORDER — HEPARIN SODIUM (PORCINE) 1000 UNIT/ML IJ SOLN
INTRAMUSCULAR | Status: DC | PRN
  Administered 2024-02-20: 15000 [IU] via INTRAVENOUS

## 2024-02-20 MED ORDER — MORPHINE SULFATE (PF) 2 MG/ML IV SOLN: 1.0000 mg | INTRAVENOUS | Status: DC | PRN

## 2024-02-20 MED ORDER — IOPAMIDOL (ISOVUE-370) INJECTION 76%
INTRAVENOUS | Status: DC | PRN
  Administered 2024-02-20: 40 mL

## 2024-02-20 MED ORDER — OXYCODONE HCL 5 MG PO TABS: 5.0000 mg | ORAL_TABLET | Freq: Once | ORAL | Status: DC | PRN

## 2024-02-20 MED ORDER — NITROGLYCERIN IN D5W 200-5 MCG/ML-% IV SOLN: 0.0000 ug/min | INTRAVENOUS | Status: DC

## 2024-02-20 MED ORDER — INSULIN ASPART 100 UNIT/ML IJ SOLN
0.0000 [IU] | Freq: Three times a day (TID) | INTRAMUSCULAR | Status: DC
  Administered 2024-02-20 – 2024-02-21 (×2): 2 [IU] via SUBCUTANEOUS
  Filled 2024-02-20 (×2): qty 1

## 2024-02-20 MED ORDER — PROTAMINE SULFATE 10 MG/ML IV SOLN
INTRAVENOUS | Status: DC | PRN
  Administered 2024-02-20 (×2): 25 mg via INTRAVENOUS

## 2024-02-20 MED ORDER — TRAMADOL HCL 50 MG PO TABS: 50.0000 mg | ORAL_TABLET | ORAL | Status: DC | PRN

## 2024-02-20 MED ORDER — SODIUM CHLORIDE 0.9 % IV SOLN: 250.0000 mL | INTRAVENOUS | Status: DC | PRN

## 2024-02-20 MED ORDER — CEFAZOLIN SODIUM-DEXTROSE 2-4 GM/100ML-% IV SOLN
2.0000 g | Freq: Three times a day (TID) | INTRAVENOUS | Status: AC
  Administered 2024-02-20 – 2024-02-21 (×2): 2 g via INTRAVENOUS
  Filled 2024-02-20 (×2): qty 100

## 2024-02-20 MED ORDER — PREGABALIN 25 MG PO CAPS
50.0000 mg | ORAL_CAPSULE | Freq: Every day | ORAL | Status: DC
  Administered 2024-02-20: 50 mg via ORAL
  Filled 2024-02-20: qty 2

## 2024-02-20 MED ORDER — POTASSIUM CHLORIDE CRYS ER 20 MEQ PO TBCR
30.0000 meq | EXTENDED_RELEASE_TABLET | Freq: Once | ORAL | Status: AC
  Administered 2024-02-20: 30 meq via ORAL
  Filled 2024-02-20: qty 1

## 2024-02-20 MED ORDER — FENTANYL CITRATE (PF) 100 MCG/2ML IJ SOLN
INTRAMUSCULAR | Status: DC | PRN
  Administered 2024-02-20: 25 ug via INTRAVENOUS

## 2024-02-20 MED ORDER — ACETAMINOPHEN 325 MG PO TABS: 650.0000 mg | ORAL_TABLET | Freq: Four times a day (QID) | ORAL | Status: DC | PRN

## 2024-02-20 MED ORDER — OXYCODONE HCL 5 MG PO TABS: 5.0000 mg | ORAL_TABLET | ORAL | Status: DC | PRN

## 2024-02-20 MED ORDER — SODIUM CHLORIDE 0.9% FLUSH
3.0000 mL | Freq: Two times a day (BID) | INTRAVENOUS | Status: DC
  Administered 2024-02-21 (×2): 3 mL via INTRAVENOUS

## 2024-02-20 MED ORDER — SODIUM CHLORIDE 0.9 % IV SOLN: INTRAVENOUS | Status: DC

## 2024-02-20 MED ORDER — HEPARIN (PORCINE) IN NACL 1000-0.9 UT/500ML-% IV SOLN
INTRAVENOUS | Status: DC | PRN
  Administered 2024-02-20: 500 mL

## 2024-02-20 MED ORDER — FENTANYL CITRATE (PF) 100 MCG/2ML IJ SOLN: 25.0000 ug | INTRAMUSCULAR | Status: DC | PRN

## 2024-02-20 MED ORDER — OXYCODONE HCL 5 MG/5ML PO SOLN: 5.0000 mg | Freq: Once | ORAL | Status: DC | PRN

## 2024-02-20 MED ORDER — ROSUVASTATIN CALCIUM 5 MG PO TABS
5.0000 mg | ORAL_TABLET | Freq: Every day | ORAL | Status: DC
  Administered 2024-02-20: 5 mg via ORAL
  Filled 2024-02-20: qty 1

## 2024-02-20 MED ORDER — CHLORHEXIDINE GLUCONATE 4 % EX SOLN: 30.0000 mL | CUTANEOUS | Status: DC

## 2024-02-20 MED ORDER — ASPIRIN 81 MG PO CHEW
81.0000 mg | CHEWABLE_TABLET | Freq: Every day | ORAL | Status: DC
  Administered 2024-02-20 – 2024-02-21 (×2): 81 mg via ORAL
  Filled 2024-02-20 (×2): qty 1

## 2024-02-20 MED ORDER — LIDOCAINE HCL (PF) 1 % IJ SOLN
INTRAMUSCULAR | Status: DC | PRN
  Administered 2024-02-20 (×2): 15 mL

## 2024-02-20 MED ORDER — CHLORHEXIDINE GLUCONATE 4 % EX SOLN: 60.0000 mL | Freq: Once | CUTANEOUS | Status: DC

## 2024-02-20 MED ORDER — SODIUM CHLORIDE 0.9% FLUSH: 3.0000 mL | INTRAVENOUS | Status: DC | PRN

## 2024-02-20 NOTE — Anesthesia Preprocedure Evaluation (Signed)
"                                    Anesthesia Evaluation  Patient identified by MRN, date of birth, ID band Patient awake    Reviewed: Allergy & Precautions, H&P , NPO status , Patient's Chart, lab work & pertinent test results  Airway Mallampati: II   Neck ROM: full    Dental   Pulmonary sleep apnea , former smoker   breath sounds clear to auscultation       Cardiovascular hypertension, +CHF  + Valvular Problems/Murmurs AS  Rhythm:regular Rate:Normal  EF 35%, severe AS   Neuro/Psych  PSYCHIATRIC DISORDERS Anxiety        GI/Hepatic   Endo/Other  diabetes, Type 2    Renal/GU      Musculoskeletal  (+) Arthritis ,    Abdominal   Peds  Hematology   Anesthesia Other Findings   Reproductive/Obstetrics                              Anesthesia Physical Anesthesia Plan  ASA: 3  Anesthesia Plan: MAC   Post-op Pain Management:    Induction: Intravenous  PONV Risk Score and Plan: 2 and Ondansetron , Propofol  infusion and Treatment may vary due to age or medical condition  Airway Management Planned: Simple Face Mask  Additional Equipment:   Intra-op Plan:   Post-operative Plan: Extubation in OR  Informed Consent: I have reviewed the patients History and Physical, chart, labs and discussed the procedure including the risks, benefits and alternatives for the proposed anesthesia with the patient or authorized representative who has indicated his/her understanding and acceptance.     Dental advisory given  Plan Discussed with: CRNA, Anesthesiologist and Surgeon  Anesthesia Plan Comments:         Anesthesia Quick Evaluation  "

## 2024-02-20 NOTE — Progress Notes (Signed)
" °  Echocardiogram 2D Echocardiogram has been performed.  Abhijay Morriss 02/20/2024, 3:01 PM "

## 2024-02-20 NOTE — Op Note (Signed)
 " [facility]   MULTIDISCIPLINARY HEART VALVE TEAM   TAVR OPERATIVE NOTE   Date of Procedure:  02/20/2024  Preoperative Diagnosis: Severe Aortic Stenosis   Postoperative Diagnosis: Same   Procedure:   Transcatheter Aortic Valve Replacement - Percutaneous Right Transfemoral Approach  Edwards Sapien 3 Ultra Resilia THV (size 23 mm, model # 9755RSL, serial # [id number])   Co-Surgeons:  Dorise LOIS Fellers, MD and Ozell Fell, MD   Anesthesiologist:  Maryclare, MD  Echocardiographer:  Santo, MD  Pre-operative Echo Findings: Severe aortic stenosis Moderate left ventricular systolic dysfunction  Post-operative Echo Findings: No paravalvular leak Unchanged moderate left ventricular systolic dysfunction   BRIEF CLINICAL NOTE AND INDICATIONS FOR SURGERY  This 69 year old woman has stage D, severe, symptomatic low-flow/low gradient aortic stenosis with NYHA class II symptoms of exertional fatigue and shortness of breath consistent with chronic diastolic congestive heart failure.  I have personally reviewed her 2D echocardiogram, cardiac catheterization, and CTA studies.  Her most recent echocardiogram shows a severely calcified and thickened aortic valve with restricted leaflet mobility.  The mean gradient was 24.5 mmHg with a valve area by VTI of 0.68 cm and dimensionless index of 0.24.  Stroke-volume index was low at 25 with a drop in her ejection fraction to 35 to 40% with global hypokinesis and grade 2 diastolic dysfunction.  Cardiac catheterization showed mild nonobstructive coronary disease with elevated right heart pressures.  I agree that aortic valve replacement is indicated in this patient for relief of her symptoms and to prevent progressive left ventricular dysfunction.  Given her age and comorbidities I think transcatheter aortic valve replacement would be a reasonable option for treating her.  She said that she is not really interested in having open surgery  anyway.  Her gated cardiac CTA shows anatomy suitable for TAVR using a 23 mm SAPIEN 3 valve.  Her abdominal and pelvic CTA shows adequate pelvic vascular anatomy to allow transfemoral insertion.   The patient was counseled at length regarding treatment alternatives for management of severe symptomatic aortic stenosis. The risks and benefits of surgical intervention has been discussed in detail. Long-term prognosis with medical therapy was discussed. Alternative approaches such as conventional surgical aortic valve replacement, transcatheter aortic valve replacement, and palliative medical therapy were compared and contrasted at length. This discussion was placed in the context of the patient's own specific clinical presentation and past medical history. All of their questions have been addressed.    Following the decision to proceed with transcatheter aortic valve replacement, a discussion was held regarding what types of management strategies would be attempted intraoperatively in the event of life-threatening complications, including whether or not the patient would be considered a candidate for the use of cardiopulmonary bypass and/or conversion to open sternotomy for attempted surgical intervention.  I think she would be a candidate for emergent sternotomy to manage any intraoperative complications although it would be high risk due to significant calcification of her aortic root particular around the right coronary ostium.  The patient has been advised of a variety of complications that might develop including but not limited to risks of death, stroke, paravalvular leak, aortic dissection or other major vascular complications, aortic annulus rupture, device embolization, cardiac rupture or perforation, mitral regurgitation, acute myocardial infarction, arrhythmia, heart block or bradycardia requiring permanent pacemaker placement, congestive heart failure, respiratory failure, renal failure, pneumonia,  infection, other late complications related to structural valve deterioration or migration, or other complications that might ultimately cause a temporary or permanent  loss of functional independence or other long term morbidity. The patient provides full informed consent for the procedure as described and all questions were answered.     DETAILS OF THE OPERATIVE PROCEDURE  PREPARATION:    The patient was brought to the operating room on the above mentioned date and appropriate monitoring was established by the anesthesia team. The patient was placed in the supine position on the operating table.  Intravenous antibiotics were administered. The patient was monitored closely throughout the procedure under conscious sedation.   Baseline transthoracic echocardiogram was performed. The patient's abdomen and both groins were prepped and draped in a sterile manner. A time out procedure was performed.   PERIPHERAL ACCESS:    Using the modified Seldinger technique, femoral arterial and venous access was obtained with placement of 6 Fr sheaths on the left side.  A pigtail diagnostic catheter was passed through the left arterial sheath under fluoroscopic guidance into the aortic root.  A temporary transvenous pacemaker catheter was passed through the left femoral venous sheath under fluoroscopic guidance into the right ventricle.  The pacemaker was tested to ensure stable lead placement and pacemaker capture. Aortic root angiography was performed in order to determine the optimal angiographic angle for valve deployment.   TRANSFEMORAL ACCESS:   Percutaneous transfemoral access and sheath placement was performed using ultrasound guidance.  The right common femoral artery was cannulated using a micropuncture needle and appropriate location was verified using hand injection angiogram.  A pair of Abbott Perclose percutaneous closure devices were placed and a 6 French sheath replaced into the femoral artery.  The  patient was heparinized systemically and ACT verified > 250 seconds.    A 14 Fr transfemoral E-sheath was introduced into the right common femoral artery after progressively dilating over an Amplatz superstiff wire. An AL-1 catheter was used to direct a straight-tip exchange length wire across the native aortic valve into the left ventricle. This was exchanged out for a pigtail catheter and position was confirmed in the LV apex. Simultaneous LV and Ao pressures were recorded.  The pigtail catheter was exchanged for a Safari wire in the LV apex.   BALLOON AORTIC VALVULOPLASTY:   Not performed    TRANSCATHETER HEART VALVE DEPLOYMENT:   An Edwards Sapien 3 Ultra transcatheter heart valve (size 23 mm) was prepared and crimped per manufacturer's guidelines, and the proper orientation of the valve is confirmed on the [employer] delivery system. The valve was advanced through the introducer sheath using normal technique until in an appropriate position in the abdominal aorta beyond the sheath tip. The balloon was then retracted and using the fine-tuning wheel was centered on the valve. The valve was then advanced across the aortic arch using appropriate flexion of the catheter. The valve was carefully positioned across the aortic valve annulus. The Commander catheter was retracted using normal technique. Once final position of the valve has been confirmed by angiographic assessment, the valve is deployed during rapid ventricular pacing to maintain systolic blood pressure < 50 mmHg and pulse pressure < 10 mmHg. The balloon inflation is held for >3 seconds after reaching full deployment volume. Once the balloon has fully deflated the balloon is retracted into the ascending aorta and valve function is assessed using echocardiography. There is felt to be no paravalvular leak and no central aortic insufficiency.  The patient's hemodynamic recovery following valve deployment is good.  The deployment balloon and  guidewire are both removed.    PROCEDURE COMPLETION:   The  sheath was removed and femoral artery closure performed.  Protamine  was administered once femoral arterial repair was complete. The temporary pacemaker, pigtail catheter and femoral sheaths were removed with manual pressure used for venous hemostasis.  A Mynx femoral closure device was utilized following removal of the diagnostic sheath in the left femoral artery.  The patient tolerated the procedure well and is transported to the cath lab recovery area in stable condition. There were no immediate intraoperative complications. All sponge instrument and needle counts are verified correct at completion of the operation.   No blood products were administered during the operation.  The patient received a total of 40 mL of intravenous contrast during the procedure.   Dorise MARLA Fellers, MD 02/20/2024       "

## 2024-02-20 NOTE — Op Note (Signed)
 " HEART AND VASCULAR CENTER   MULTIDISCIPLINARY HEART VALVE TEAM   TAVR OPERATIVE NOTE   Date of Procedure:  02/20/2024  Preoperative Diagnosis: Severe Aortic Stenosis   Postoperative Diagnosis: Same   Procedure:   Transcatheter Aortic Valve Replacement - Percutaneous Transfemoral Approach  Edwards Sapien 3 Ultra Resilia THV (size 23 mm, serial # [id number] )   Co-Surgeons:  Dorise Fellers, MD and Ozell Fell, MD  Anesthesiologist:  Juliene Clinton, MD  Echocardiographer:  Stanly Leavens, MD  Pre-operative Echo Findings: Severe aortic stenosis Normal left ventricular systolic function  Post-operative Echo Findings: No paravalvular leak Normal/unchanged left ventricular systolic function  BRIEF CLINICAL NOTE AND INDICATIONS FOR SURGERY  69 yo woman who has developed severe, symptomatic aortic stenosis. She has undergone appropriate preoperative imaging studies and after review of all available data, she is referred for TAVR.   During the course of the patient's preoperative work up they have been evaluated comprehensively by a multidisciplinary team of specialists coordinated through the Multidisciplinary Heart Valve Clinic in the [facility].  They have been demonstrated to suffer from symptomatic severe aortic stenosis as noted above. The patient has been counseled extensively as to the relative risks and benefits of all options for the treatment of severe aortic stenosis including long term medical therapy, conventional surgery for aortic valve replacement, and transcatheter aortic valve replacement.  The patient has been independently evaluated in formal cardiac surgical consultation by Dr Fellers, who deemed the patient appropriate for TAVR. Based upon review of all of the patient's preoperative diagnostic tests they are felt to be candidate for transcatheter aortic valve replacement using the transfemoral approach as an alternative to conventional  surgery.    Following the decision to proceed with transcatheter aortic valve replacement, a discussion has been held regarding what types of management strategies would be attempted intraoperatively in the event of life-threatening complications, including whether or not the patient would be considered a candidate for the use of cardiopulmonary bypass and/or conversion to open sternotomy for attempted surgical intervention.  The patient has been advised of a variety of complications that might develop peculiar to this approach including but not limited to risks of death, stroke, paravalvular leak, aortic dissection or other major vascular complications, aortic annulus rupture, device embolization, cardiac rupture or perforation, acute myocardial infarction, arrhythmia, heart block or bradycardia requiring permanent pacemaker placement, congestive heart failure, respiratory failure, renal failure, pneumonia, infection, other late complications related to structural valve deterioration or migration, or other complications that might ultimately cause a temporary or permanent loss of functional independence or other long term morbidity.  The patient provides full informed consent for the procedure as described and all questions were answered preoperatively.  DETAILS OF THE OPERATIVE PROCEDURE  PREPARATION:   The patient is brought to the operating room on the above mentioned date. The patient is placed in the supine position on the operating table.  Intravenous antibiotics are administered. The patient is monitored closely throughout the procedure under conscious sedation.    Baseline transthoracic echocardiogram is performed. The patient's chest, abdomen, both groins, and both lower extremities are prepared and draped in a sterile manner. A time out procedure is performed.   PERIPHERAL ACCESS:   Using ultrasound guidance, femoral arterial and venous access is obtained with placement of 6 Fr sheaths on the  left side.  US  images are digitally captured and stored in the patient's chart. A pigtail diagnostic catheter was passed through the femoral arterial sheath under  fluoroscopic guidance into the aortic root.  A temporary transvenous pacemaker catheter was passed through the femoral venous sheath under fluoroscopic guidance into the right ventricle.  The pacemaker was tested to ensure stable lead placement and pacemaker capture. Aortic root angiography was performed in order to determine the optimal angiographic angle for valve deployment.  TRANSFEMORAL ACCESS:  A micropuncture technique is used to access the right femoral artery under fluoroscopic and ultrasound guidance.  2 Perclose devices are deployed at 10' and 2' positions to 'PreClose' the femoral artery. An 8 French sheath is placed and then an Amplatz Superstiff wire is advanced through the sheath. This is changed out for a 14 French transfemoral E-Sheath after progressively dilating over the Superstiff wire.  An AL-1 catheter was used to direct a straight-tip exchange length wire across the native aortic valve into the left ventricle. This was exchanged out for a pigtail catheter and position was confirmed in the LV apex. Simultaneous LV and Ao pressures were recorded.  The pigtail catheter was exchanged for a Safari wire in the LV apex.    BALLOON AORTIC VALVULOPLASTY:  Not performed  TRANSCATHETER HEART VALVE DEPLOYMENT:  An Edwards Sapien 3 Ultra Resilia transcatheter heart valve (size 23 mm) was prepared and crimped per manufacturer's guidelines, and the proper orientation of the valve is confirmed on the [employer] delivery system. The valve was advanced through the introducer sheath using normal technique until in an appropriate position in the abdominal aorta beyond the sheath tip. The balloon was then retracted and using the fine-tuning wheel was centered on the valve. The valve was then advanced across the aortic arch using  appropriate flexion of the catheter. The valve was carefully positioned across the aortic valve annulus. The Commander catheter was retracted using normal technique. Once final position of the valve has been confirmed by angiographic assessment, the valve is deployed while temporarily holding ventilation and during rapid ventricular pacing to maintain systolic blood pressure < 50 mmHg and pulse pressure < 10 mmHg. The balloon inflation is held for >3 seconds after reaching full deployment volume. Once the balloon has fully deflated the balloon is retracted into the ascending aorta and valve function is assessed using echocardiography. The patient's hemodynamic recovery following valve deployment is good.  The deployment balloon and guidewire are both removed. Echo demostrated acceptable post-procedural gradients, stable mitral valve function, and no aortic insufficiency.    PROCEDURE COMPLETION:  The sheath was removed and femoral artery closure is performed using the 2 previously deployed Perclose devices.  Protamine  is administered once femoral arterial repair was complete. The site is clear with no evidence of bleeding or hematoma after the sutures are tightened. The temporary pacemaker and pigtail catheters are removed. Mynx closure  is used for contralateral femoral arterial hemostasis for the 6 Fr sheath.  The patient tolerated the procedure well and is transported to the recovery area in stable condition. There were no immediate intraoperative complications. All sponge instrument and needle counts are verified correct at completion of the operation.   The patient received a total of 40 mL of intravenous contrast during the procedure.  EBL: minimal  LVEDP: 29 mmHg  With progressive dyspnea, recent need to increase furosemide  due to elevated right and left heart pressures at diagnostic cath, and severely elevated LVEDP of 29 mmHg, the patient's current condition is consistent with acute on chronic  HFpEF.   Ozell Fell, MD 02/20/2024 3:34 PM  "

## 2024-02-20 NOTE — Interval H&P Note (Signed)
 History and Physical Interval Note:  02/20/2024 1:30 PM  [name]  has presented today for surgery, with the diagnosis of Severe Aortic Stenosis.  The various methods of treatment have been discussed with the patient and family. After consideration of risks, benefits and other options for treatment, the patient has consented to  Procedures: Transcatheter Aortic Valve Replacement, Transfemoral (N/A) ECHOCARDIOGRAM, TRANSTHORACIC (N/A) as a surgical intervention.  The patient's history has been reviewed, patient examined, no change in status, stable for surgery.  I have reviewed the patient's chart and labs.  Questions were answered to the patient's satisfaction.     Ghadeer Kastelic K [name]

## 2024-02-20 NOTE — Progress Notes (Addendum)
" °  HEART AND VASCULAR CENTER   MULTIDISCIPLINARY HEART VALVE TEAM  Patient doing well s/p TAVR. She is hemodynamically stable but BP running on soft side. Groin sites stable. ECG with pre existing IVCD and no high grade block. Transferred from cath lab holding to 4E. LVEDP 29 mm hg at the time of TAVR. Will give IV lasix  60mg  and KCL 30 x 1. Will order purewick as pt very worried about having to get up to urinate all night. Early ambulation after bedrest completed and hopeful discharge over the next 24-48 hours.   Lamarr Hummer PA-C  [facility]  Pager [phone]  "

## 2024-02-20 NOTE — Discharge Instructions (Signed)

## 2024-02-20 NOTE — Discharge Summary (Signed)
 " HEART AND VASCULAR CENTER   MULTIDISCIPLINARY HEART VALVE TEAM  Discharge Summary    Patient ID: [name] MRN: [id number]; DOB: Dec 17, 1955  Admit date: 02/20/2024 Discharge date: 02/21/2024  PCP:  Kathrene [name] [name], PA-C  [facility] Cardiologist:  [name], MD  [facility] Structural heart: Ozell Fell, MD [facility] Electrophysiologist:  None   Discharge Diagnoses    Principal Problem:   S/P TAVR (transcatheter aortic valve replacement) Active Problems:   Hypercholesterolemia   Personal history of malignant neoplasm of breast   OSA on CPAP   Left bundle branch block (LBBB)   Acute on chronic HFrEF (heart failure with reduced ejection fraction) (HCC)   Morbid obesity (HCC)   Nonischemic cardiomyopathy (HCC)   Severe aortic stenosis   Diabetes mellitus (HCC)   Allergies Allergies[1]  Diagnostic Studies/Procedures    TAVR OPERATIVE NOTE     Date of Procedure:                02/20/2024   Preoperative Diagnosis:      Severe Aortic Stenosis    Postoperative Diagnosis:    Same    Procedure:        Transcatheter Aortic Valve Replacement - Percutaneous Transfemoral Approach             Edwards Sapien 3 Ultra Resilia THV (size 23 mm, serial # [id number] )              Co-Surgeons:                        Dorise Fellers, MD and Ozell Fell, MD   Anesthesiologist:                  Juliene Clinton, MD   Echocardiographer:              Stanly Leavens, MD   Pre-operative Echo Findings: Severe aortic stenosis Normal left ventricular systolic function   Post-operative Echo Findings: No paravalvular leak Normal/unchanged left ventricular systolic function _____________    Echo 02/21/24: completed but pending formal read at the time of discharge   History of Present Illness     [name] is a 69 y.o. female with a history of breast cancer s/p lumpectomy, NICM, chronic combined systolic and diastolic CHF, HTN, HLD, peripheral neuropathy,  LBBB, OSA on CPAP, former tobacco abuse, DMT2, morbid obesity (BMI 37), and severe aortic stenosis who presented to [facility] on 02/20/24 for planned TAVR.   She was seen in the structural heart clinic in 03/2023 for workup of her aortic stenosis. She had a history of reduced ejection fraction of 30% dating back to 2015. Cardiac catheterization at that time showed no evidence of coronary disease. She has been followed by cardiology with moderate aortic stenosis. Echocardiogram in January 2025 showed EF 45% and LFLG AS with mean gradient of 31 mm hg and AVA 0.79cm2. She was asymptomatic at that time and continued follow-up was recommended. Follow up echo 01/02/24 showed further reduction in LV EF to 35-40% and continued LFLG AS with mean grad 24.5 mmHg, Vmax 3.30 m/s, AVA 0.68 cm2, DVI 0.24, SVI 25. L/RHC 01/31/24 showed mild nonobstructive coronary disease with elevated right heart pressures. Lasix  was increased for several days. She reported that she feels well overall with mild dyspnea on exertion and fatigue. 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  The patient was evaluated by the multidisciplinary  valve team and felt to have severe, symptomatic aortic stenosis and to be a suitable candidate for TAVR, which was set up for 02/20/24.  Hospital Course     Consultants: none   Severe AS:  -- S/p TAVR with a 23 mm Edwards Sapien 3 Ultra Resilia THV via the TF approach on 02/20/24.  -- Post operative echo completed but pending formal read. -- Groin sites are stable.  -- ECG with sinus and no high grade heart block. -- Continue Asprin 81mg  daily.  -- Met with cardiac rehab to discuss CRP phase II.  -- Plan for discharge home today with close follow up in the outpatient setting.   Acute on chronic HFmrEF: -- NICM.  -- EF 40%. Echo pending today.  -- LVEDP 29 mm hg at the time of TAVR.  -- Treated with IV Lasix  with 1 L UOP.  -- Resume home lasix  40mg  daily with an extra  40mg  if needed. I have asked her to take 80mg  for three days and then go back down to 40mg  daily. -- Continue Coreg  6.25mg  BID, Entresto  24-26mg  BID, and spiro 12.5 mg daily. Had yeast infections with SGLT2s.   DMT2:  -- Treated with SSI while admitted.  -- Resume home meds at discharge.  -- Okay to resume Metformin  after 48 hours after contrast dye exposure (1/8 PM)  HTN: -- BP well controlled. -- Resume home meds. BP treated in the context of CHF GDMT.   _____________  Discharge Vitals Blood pressure 129/78, pulse 75, temperature 98.9 F (37.2 C), temperature source Oral, resp. rate 14, height 5' 4 (1.626 m), weight 98.6 kg, SpO2 98%.  Filed Weights   02/20/24 1121 02/21/24 0605  Weight: 98.4 kg 98.6 kg     GEN: Well nourished, well developed in no acute distress NECK: No JVD CARDIAC: RRR, no murmurs, rubs, gallops RESPIRATORY:  Clear to auscultation without rales, wheezing or rhonchi  ABDOMEN: Soft, non-tender, non-distended EXTREMITIES:  No edema; No deformity.  Groin sites clear without hematoma or ecchymosis.    Disposition   Pt is being discharged home today in good condition.  Follow-up Plans & Appointments     Follow-up Information     Sebastian Lamarr SAUNDERS, PA-C. Go on 03/04/2024.   Specialties: Cardiology, Radiology Why: @ 10:20am, please arrive at least 20 minutes early Contact information: [address] [place] [address] [phone]                  Discharge Medications   Allergies as of 02/21/2024       Reactions   Jardiance  [empagliflozin ] Itching, Rash, Other (See Comments)   Yeast infection   Lipitor [atorvastatin] Other (See Comments)   Myalgias    Pravastatin Sodium Other (See Comments)   Myalgias   Statins Other (See Comments)   Myalgias - Lipitor and Pravachol. Patient able to tolerate Crestor  5mg  daily.   Sulfa Antibiotics Hives        Medication List     TAKE these medications    Accu-Chek Guide Test test  strip Generic drug: glucose blood USE TO CHECK BLOOD SUGAR 2-3 TIMES DAILY   Accu-Chek Softclix Lancets lancets USE 1 STRIP TO CHECK BLOOD SUGAR 2-3 TIMES DAILY   acetaminophen  650 MG CR tablet Commonly known as: TYLENOL  Take 1,300 mg by mouth 2 (two) times daily as needed for pain.   aspirin  81 MG tablet Take 81 mg by mouth daily.   carvedilol  6.25 MG tablet Commonly known as: COREG  Take  1 tablet (6.25 mg total) by mouth 2 (two) times daily with a meal.   cyanocobalamin  1000 MCG tablet Commonly known as: VITAMIN B12 Take 1,000 mcg by mouth daily.   diphenhydrAMINE 25 MG tablet Commonly known as: BENADRYL Take 25 mg by mouth at bedtime as needed (sinus irritation, runny nose when using CPAP).   Entresto  24-26 MG Generic drug: sacubitril -valsartan  Take 1 tablet by mouth 2 (two) times daily.   ezetimibe  10 MG tablet Commonly known as: ZETIA  Take 1 tablet (10 mg total) by mouth daily. What changed: when to take this   furosemide  40 MG tablet Commonly known as: LASIX  Take 80mg  x 3 days and then go down to 40mg  daily. Can take an extra 40mg  if need for fluid retention What changed: See the new instructions.   GLUCOSAMINE CHONDROIT MSM DS PO Take 2 tablets by mouth daily.   metFORMIN  500 MG 24 hr tablet Commonly known as: GLUCOPHAGE -XR 3 tabs qd What changed:  how much to take how to take this when to take this additional instructions Notes to patient: Okay to resume Metformin  after 48 hours after contrast dye exposure (1/8 PM)   Multivitamin Women 50+ Tabs Take 1 tablet by mouth daily.   Ozempic  (0.25 or 0.5 MG/DOSE) 2 MG/3ML Sopn Generic drug: Semaglutide (0.25 or 0.5MG /DOS) Take 2.5 mg weekly for 4 weeks and increase to 0.5 mg weekly.   pregabalin  50 MG capsule Commonly known as: LYRICA  Take 1 capsule by mouth at bedtime   rosuvastatin  5 MG tablet Commonly known as: CRESTOR  Take 1 tablet by mouth once daily What changed: when to take this   sodium  chloride 0.65 % Soln nasal spray Commonly known as: OCEAN Place 1-2 sprays into both nostrils as needed (sinus irritation, nasal dryness).   spironolactone  25 MG tablet Commonly known as: ALDACTONE  Take 1/2 (one-half) tablet by mouth once daily   VITAMIN C PO Take 2,000 mg by mouth daily.           Outstanding Labs/Studies   BMET  ______________________  Duration of Discharge Encounter: APP Time: 31 minutes    Signed, Lamarr Hummer, PA-C 02/21/2024, 11:03 AM [phone]      [1]  Allergies Allergen Reactions   Jardiance  [Empagliflozin ] Itching, Rash and Other (See Comments)    Yeast infection   Lipitor [Atorvastatin] Other (See Comments)    Myalgias    Pravastatin Sodium Other (See Comments)    Myalgias   Statins Other (See Comments)    Myalgias - Lipitor and Pravachol. Patient able to tolerate Crestor  5mg  daily.   Sulfa Antibiotics Hives   "

## 2024-02-20 NOTE — Transfer of Care (Signed)
 Immediate Anesthesia Transfer of Care Note  Patient: [name]  Procedure(s) Performed: Transcatheter Aortic Valve Replacement, Transfemoral ECHOCARDIOGRAM, TRANSTHORACIC  Patient Location: Cath Lab  Anesthesia Type:MAC  Level of Consciousness: awake, alert , and oriented  Airway & Oxygen Therapy: Patient Spontanous Breathing  Post-op Assessment: Report given to RN and Post -op Vital signs reviewed and stable  Post vital signs: Reviewed and stable  Last Vitals:  Vitals Value Taken Time  BP 99/64 02/20/24 15:45  Temp 36.6 C 02/20/24 15:30  Pulse 58 02/20/24 15:49  Resp 13 02/20/24 15:48  SpO2 95 % 02/20/24 15:49  Vitals shown include unfiled device data.  Last Pain:  Vitals:   02/20/24 1530  TempSrc: Axillary  PainSc: 0-No pain      Patients Stated Pain Goal: 0 (02/20/24 1136)  Complications: There were no known notable events for this encounter.

## 2024-02-21 ENCOUNTER — Inpatient Hospital Stay (HOSPITAL_COMMUNITY)

## 2024-02-21 ENCOUNTER — Ambulatory Visit: Payer: Self-pay | Admitting: Physician Assistant

## 2024-02-21 ENCOUNTER — Encounter (HOSPITAL_COMMUNITY): Payer: Self-pay | Admitting: Cardiovascular Disease

## 2024-02-21 DIAGNOSIS — I428 Other cardiomyopathies: Secondary | ICD-10-CM | POA: Diagnosis not present

## 2024-02-21 DIAGNOSIS — I5023 Acute on chronic systolic (congestive) heart failure: Secondary | ICD-10-CM | POA: Diagnosis not present

## 2024-02-21 DIAGNOSIS — I272 Pulmonary hypertension, unspecified: Secondary | ICD-10-CM | POA: Diagnosis not present

## 2024-02-21 DIAGNOSIS — I350 Nonrheumatic aortic (valve) stenosis: Secondary | ICD-10-CM | POA: Diagnosis not present

## 2024-02-21 DIAGNOSIS — Z952 Presence of prosthetic heart valve: Secondary | ICD-10-CM | POA: Diagnosis not present

## 2024-02-21 LAB — BASIC METABOLIC PANEL WITH GFR
Anion gap: 11 (ref 5–15)
BUN: 16 mg/dL (ref 8–23)
CO2: 23 mmol/L (ref 22–32)
Calcium: 9.4 mg/dL (ref 8.9–10.3)
Chloride: 104 mmol/L (ref 98–111)
Creatinine, Ser: 0.74 mg/dL (ref 0.44–1.00)
GFR, Estimated: >60 mL/min
Glucose, Bld: 115 mg/dL — ABNORMAL HIGH (ref 70–99)
Potassium: 3.8 mmol/L (ref 3.5–5.1)
Sodium: 138 mmol/L (ref 135–145)

## 2024-02-21 LAB — ECHOCARDIOGRAM COMPLETE
AR max vel: 2.02 cm2
AV Area VTI: 1.86 cm2
AV Area mean vel: 1.93 cm2
AV Mean grad: 12 mmHg
AV Peak grad: 18.5 mmHg
Ao pk vel: 2.15 m/s
Area-P 1/2: 5.06 cm2
Calc EF: 48.9 %
Height: 64 in
S' Lateral: 4.4 cm
Single Plane A2C EF: 53.7 %
Single Plane A4C EF: 44.7 %
Weight: 3477.98 [oz_av]

## 2024-02-21 LAB — CBC
HCT: 36.6 % (ref 36.0–46.0)
Hemoglobin: 12.4 g/dL (ref 12.0–15.0)
MCH: 31 pg (ref 26.0–34.0)
MCHC: 33.9 g/dL (ref 30.0–36.0)
MCV: 91.5 fL (ref 80.0–100.0)
Platelets: 265 K/uL (ref 150–400)
RBC: 4 MIL/uL (ref 3.87–5.11)
RDW: 13.4 % (ref 11.5–15.5)
WBC: 8.3 K/uL (ref 4.0–10.5)
nRBC: 0 % (ref 0.0–0.2)

## 2024-02-21 LAB — MAGNESIUM: Magnesium: 2.2 mg/dL (ref 1.7–2.4)

## 2024-02-21 LAB — GLUCOSE, CAPILLARY: Glucose-Capillary: 137 mg/dL — ABNORMAL HIGH (ref 70–99)

## 2024-02-21 MED ORDER — SACUBITRIL-VALSARTAN 24-26 MG PO TABS
1.0000 | ORAL_TABLET | Freq: Two times a day (BID) | ORAL | Status: DC
  Administered 2024-02-21: 1 via ORAL
  Filled 2024-02-21: qty 1

## 2024-02-21 MED ORDER — FUROSEMIDE 40 MG PO TABS
80.0000 mg | ORAL_TABLET | Freq: Once | ORAL | Status: AC
  Administered 2024-02-21: 80 mg via ORAL
  Filled 2024-02-21: qty 2

## 2024-02-21 MED ORDER — FUROSEMIDE 40 MG PO TABS: ORAL_TABLET | ORAL | Status: AC

## 2024-02-21 MED ORDER — PERFLUTREN LIPID MICROSPHERE
1.0000 mL | INTRAVENOUS | Status: DC | PRN
  Administered 2024-02-21: 2 mL via INTRAVENOUS

## 2024-02-21 MED ORDER — CARVEDILOL 6.25 MG PO TABS: 6.2500 mg | ORAL_TABLET | Freq: Two times a day (BID) | ORAL | Status: DC

## 2024-02-21 MED ORDER — POTASSIUM CHLORIDE CRYS ER 20 MEQ PO TBCR
40.0000 meq | EXTENDED_RELEASE_TABLET | Freq: Once | ORAL | Status: AC
  Administered 2024-02-21: 40 meq via ORAL
  Filled 2024-02-21: qty 2

## 2024-02-21 MED ORDER — SPIRONOLACTONE 12.5 MG HALF TABLET
12.5000 mg | ORAL_TABLET | Freq: Once | ORAL | Status: AC
  Administered 2024-02-21: 12.5 mg via ORAL
  Filled 2024-02-21: qty 1

## 2024-02-21 NOTE — Progress Notes (Signed)
 1 Day Post-Op Procedures (LRB): Transcatheter Aortic Valve Replacement, Transfemoral (N/A) ECHOCARDIOGRAM, TRANSTHORACIC (N/A) Subjective:  Reports some headache and chills overnight. She ambulated early this am. No SOB.  Objective: Vital signs in last 24 hours: Temp:  [97.5 F (36.4 C)-98.2 F (36.8 C)] 98.2 F (36.8 C) (01/07 0245) Pulse Rate:  [0-82] 68 (01/07 0245) Cardiac Rhythm: Normal sinus rhythm;Bundle branch block (01/06 2000) Resp:  [13-34] 19 (01/07 0245) BP: (89-112)/(61-78) 112/68 (01/07 0245) SpO2:  [90 %-98 %] 96 % (01/07 0245) Weight:  [98.4 kg-98.6 kg] 98.6 kg (01/07 0605)  Hemodynamic parameters for last 24 hours:    Intake/Output from previous day: 01/06 0701 - 01/07 0700 In: 770 [P.O.:420; I.V.:250; IV Piggyback:100] Out: 1550 [Urine:1550] Intake/Output this shift: No intake/output data recorded.  General appearance: alert and cooperative Neurologic: intact Heart: regular rate and rhythm, S1, S2 normal, no murmur Lungs: clear to auscultation bilaterally Extremities: warm, no edema Wound: groin sites ok  Lab Results: Recent Labs    02/20/24 1502 02/21/24 0338  WBC  --  8.3  HGB 11.6* 12.4  HCT 34.0* 36.6  PLT  --  265   BMET:  Recent Labs    02/20/24 1502 02/21/24 0338  NA 141 138  K 3.7 3.8  CL 103 104  CO2  --  23  GLUCOSE 135* 115*  BUN 16 16  CREATININE 0.60 0.74  CALCIUM   --  9.4    PT/INR: No results for input(s): LABPROT, INR in the last 72 hours. ABG    Component Value Date/Time   PHART 7.402 01/31/2024 1607   HCO3 17.4 (L) 01/31/2024 1608   TCO2 23 02/20/2024 1502   ACIDBASEDEF 9.0 (H) 01/31/2024 1608   O2SAT 57 01/31/2024 1608   CBG (last 3)  Recent Labs    02/20/24 1650 02/20/24 2111 02/21/24 0543  GLUCAP 104* 141* 137*   ECG: sinus 73, non-specific IV block  Assessment/Plan: S/P Procedures (LRB): Transcatheter Aortic Valve Replacement, Transfemoral (N/A) ECHOCARDIOGRAM, TRANSTHORACIC (N/A)  POD  1  Hemodynamically stable in NSR.  She is afebrile with normal WBC ct. Not sure why she had chills overnight.  Plan 2D echo this morning.  She wants to go home which should be fine later this morning after her echo.   LOS: 1 day    Dorise MARLA Fellers 02/21/2024

## 2024-02-21 NOTE — Progress Notes (Signed)
" °   02/21/24 1148  TOC Brief Assessment  Insurance and Status Reviewed  Patient has primary care physician Yes  Home environment has been reviewed home  Prior level of function: independent  Prior/Current Home Services No current home services  Social Drivers of Health Review SDOH reviewed no interventions necessary  Readmission risk has been reviewed Yes  Transition of care needs no transition of care needs at this time    Pt s/p TAVR, stable for transition home today, no HH or DME needs noted.  "

## 2024-02-21 NOTE — Progress Notes (Signed)
 Discussed with pt restrictions, exercise guidelines, healthy diet and CRPII. Patient is not interested in cardiac rehab at this time.   [phone] Joanna JAYSON Liverpool, RN  02/21/2024 8:27 AM

## 2024-02-21 NOTE — Progress Notes (Signed)
" °  Echocardiogram 2D Echocardiogram has been performed.  Tinnie FORBES Gosling RDCS 02/21/2024, 10:49 AM "

## 2024-02-21 NOTE — Progress Notes (Addendum)
 Discharge Nurse Summary: DC order noted per MD. DC RN at bedside with patient/friend. Patient agreeable with discharge plan. Informed per PA instruction Echo ready came back with improved results, no need to wait for final ready, cardiology MD to complete final read for EF result, verbalized understanding.  AVS printed/reviewed. PIV removed, skin intact. No DME needs. No home meds. TOC meds delivered to the patient. CP/Edu resolved. Telemonitor returned to charging station. All belongings accounted for. Groin dressing to wound, CDI w/o bleeding or drainage. See LDAs. Patient to notify front desk when dressed to call volunteer transport for dc home. Front Sport And Exercise Psychologist informed.   Rosario EMERSON Lund, RN

## 2024-02-21 NOTE — Progress Notes (Signed)
 Mobility Specialist Progress Note;   02/21/24 0956  Mobility  Activity Ambulated with assistance (hallway)  Level of Assistance Moderate assist, patient does 50-74%  Assistive Device Front wheel walker  Distance Ambulated (ft) 250 ft  Activity Response Tolerated well  Mobility Referral Yes  Mobility visit 1 Mobility  Mobility Specialist Start Time (ACUTE ONLY) [id number]  Mobility Specialist Stop Time (ACUTE ONLY) 1008  Mobility Specialist Time Calculation (min) (ACUTE ONLY) 12 min   Pt eager for mobility. Required ModA for bed mobility to assist w/ trunk, however MinG assistance during ambulation. HR maintained throughout. Some SOB during ambulation, however SPO2 98% when checked. No bleeding in surgical site. Pt returned to bed and left with all needs met. Visitor and ECHO present.   Lauraine Erm Mobility Specialist Please contact via SecureChat or [employer] [phone]

## 2024-02-22 ENCOUNTER — Telehealth: Payer: Self-pay | Admitting: *Deleted

## 2024-02-22 ENCOUNTER — Telehealth: Payer: Self-pay

## 2024-02-22 NOTE — Transitions of Care (Post Inpatient/ED Visit) (Signed)
 "  02/22/2024  Name: [name] MRN: [id number] DOB: 01-11-1956  Today's TOC FU Call Status: Today's TOC FU Call Status:: Successful TOC FU Call Completed TOC FU Call Complete Date: 02/22/24  Patient's Name and Date of Birth confirmed. Name, DOB  Transition Care Management Follow-up Telephone Call Date of Discharge: 02/21/24 Discharge Facility: [facility]) Type of Discharge: Inpatient Admission Primary Inpatient Discharge Diagnosis:: S/P TAVR (transcatheter aortic valve replacement) How have you been since you were released from the hospital?:  (pt reports  appetite too good, drinking adequate fluids, no issues with bowel/ bladder, ambulating without difficulty) Any questions or concerns?: No  Items Reviewed: Did you receive and understand the discharge instructions provided?: Yes Medications obtained,verified, and reconciled?: Yes (Medications Reviewed) Any new allergies since your discharge?: No Dietary orders reviewed?: Yes Type of Diet Ordered:: heart healthy,  carbohydrate modified Do you have support at home?: Yes People in Home [RPT]:  (pt does not share who she has as support person in the home) Reviewed signs/ symptoms of infection Reviewed HF action plan, importance of daily weights,  pt states she weighs few times per week Reviewed importance of good CBG control  Medications Reviewed Today: Medications Reviewed Today     Reviewed by Aura Mliss LABOR, RN (Registered Nurse) on 02/22/24 at 1122  Med List Status: <None>   Medication Order Taking? Sig Documenting Provider Last Dose Status Informant  Accu-Chek Softclix Lancets lancets [id number] Yes USE 1 STRIP TO CHECK BLOOD SUGAR 2-3 TIMES DAILY Thapa, Sudan, MD  Active Self, Pharmacy Records  acetaminophen  (TYLENOL ) 650 MG CR tablet [id number] Yes Take 1,300 mg by mouth 2 (two) times daily as needed for pain. [provider]  Active Self, Pharmacy Records  Ascorbic Acid (VITAMIN C PO) [id number] Yes Take  2,000 mg by mouth daily. [provider]  Active Self, Pharmacy Records  aspirin  81 MG tablet [id number] Yes Take 81 mg by mouth daily. [provider]  Active Self, Pharmacy Records  carvedilol  (COREG ) 6.25 MG tablet [id number] Yes Take 1 tablet (6.25 mg total) by mouth 2 (two) times daily with a meal. Turner, [name], MD  Active Self, Pharmacy Records  cyanocobalamin  (VITAMIN B12) 1000 MCG tablet [id number] Yes Take 1,000 mcg by mouth daily. [provider]  Active Self, Pharmacy Records  diphenhydrAMINE (BENADRYL) 25 MG tablet [id number] Yes Take 25 mg by mouth at bedtime as needed (sinus irritation, runny nose when using CPAP). [provider]  Active Self, Pharmacy Records  ezetimibe  (ZETIA ) 10 MG tablet [id number] Yes Take 1 tablet (10 mg total) by mouth daily. Shlomo [name], MD  Active Self, Pharmacy Records  furosemide  (LASIX ) 40 MG tablet [id number] Yes Take 80mg  x 3 days and then go down to 40mg  daily. Can take an extra 40mg  if need for fluid retention Sebastian Lamarr SAUNDERS, PA-C  Active   Glucosamine-Chondroitin-MSM (GLUCOSAMINE CHONDROIT MSM DS PO) [id number] Yes Take 2 tablets by mouth daily. [provider]  Active Self, Pharmacy Records  glucose blood (ACCU-CHEK GUIDE TEST) test strip [id number] Yes USE TO CHECK BLOOD SUGAR 2-3 TIMES DAILY Thapa, Sudan, MD  Active Self, Pharmacy Records  metFORMIN  (GLUCOPHAGE -XR) 500 MG 24 hr tablet [id number]  3 tabs qd  Patient not taking: Reported on 02/22/2024   Thapa, Sudan, MD  Active Self, Pharmacy Records  Multiple Vitamins-Minerals (MULTIVITAMIN WOMEN 50+) TABS [id number] Yes Take 1 tablet by mouth daily. [provider]  Active Self, Pharmacy Records  pregabalin  (LYRICA ) 50 MG capsule [id number]  Yes Take 1 capsule by mouth at bedtime [name], [name], PA-C  Active Self, Pharmacy Records  rosuvastatin  (CRESTOR ) 5 MG tablet [id number] Yes Take 1 tablet by mouth once daily [name], [name], PA-C  Active  Self, Pharmacy Records  sacubitril -valsartan  (ENTRESTO ) 24-26 MG [id number] Yes Take 1 tablet by mouth 2 (two) times daily. Lelon Hamilton T, PA-C  Active Self, Pharmacy Records  Semaglutide ,0.25 or 0.5MG /DOS, (OZEMPIC , 0.25 OR 0.5 MG/DOSE,) 2 MG/3ML SOPN [id number]  Take 2.5 mg weekly for 4 weeks and increase to 0.5 mg weekly.  Patient not taking: Reported on 02/22/2024   Thapa, Sudan, MD  Active Self, Pharmacy Records           Med Note (COFFELL, JON HERO   Fri Feb 16, 2024  8:41 AM) Not started at this time, patient plans to start after her upcoming procedure.  sodium chloride  (OCEAN) 0.65 % SOLN nasal spray [id number] Yes Place 1-2 sprays into both nostrils as needed (sinus irritation, nasal dryness). [provider]  Active Self, Pharmacy Records  spironolactone  (ALDACTONE ) 25 MG tablet [id number] Yes Take 1/2 (one-half) tablet by mouth once daily Turner, [name], MD  Active Self, Pharmacy Records            Home Care and Equipment/Supplies: Were Home Health Services Ordered?: No Any new equipment or medical supplies ordered?: No  Functional Questionnaire: Do you need assistance with bathing/showering or dressing?: No Do you need assistance with meal preparation?: No Do you need assistance with eating?: No Do you have difficulty maintaining continence: No Do you need assistance with getting out of bed/getting out of a chair/moving?: No Do you have difficulty managing or taking your medications?: No  Follow up appointments reviewed: PCP Follow-up appointment confirmed?:  (pt has appointment in April, declines for RN CM to schedule sooner appointment) MD Provider Line Number:[phone] Given: No Specialist Hospital Follow-up appointment confirmed?: Yes Date of Specialist follow-up appointment?: 03/04/24 Follow-Up Specialty Provider:: Cardiology  Lamarr Hummer Do you need transportation to your follow-up appointment?: No Do you understand care options if your condition(s)  worsen?: Yes-patient verbalized understanding   Mliss Creed [facility], BSN RN Care Manager/ Transition of Care [place] [phone]  "

## 2024-02-22 NOTE — Anesthesia Postprocedure Evaluation (Signed)
"   Anesthesia Post Note  Patient: [name]  Procedure(s) Performed: Transcatheter Aortic Valve Replacement, Transfemoral ECHOCARDIOGRAM, TRANSTHORACIC     Patient location during evaluation: Cath Lab Anesthesia Type: MAC Level of consciousness: awake and alert Pain management: pain level controlled Vital Signs Assessment: post-procedure vital signs reviewed and stable Respiratory status: spontaneous breathing, nonlabored ventilation, respiratory function stable and patient connected to nasal cannula oxygen Cardiovascular status: stable and blood pressure returned to baseline Postop Assessment: no apparent nausea or vomiting Anesthetic complications: no   There were no known notable events for this encounter.  Last Vitals:  Vitals:   02/21/24 0245 02/21/24 0831  BP: 112/68 129/78  Pulse: 68 75  Resp: 19 14  Temp: 36.8 C 37.2 C  SpO2: 96% 98%    Last Pain:  Vitals:   02/21/24 0831  TempSrc: Oral  PainSc:                  [name]      "

## 2024-02-22 NOTE — Telephone Encounter (Signed)
 Patient contacted regarding discharge from [facility] on 02/21/24  Patient understands to follow up with provider Izetta Hummer, PA-C on 03/04/24 at 10:20AM at [address] Location. Patient understands discharge instructions? Yes Patient understands medications and regiment? Yes Patient understands to bring all medications to this visit? Yes

## 2024-02-28 ENCOUNTER — Other Ambulatory Visit: Payer: Self-pay | Admitting: Physician Assistant

## 2024-03-04 ENCOUNTER — Ambulatory Visit: Admitting: Physician Assistant

## 2024-03-04 VITALS — BP 98/58 | HR 131 | Ht 64.0 in | Wt 215.0 lb

## 2024-03-04 DIAGNOSIS — I5022 Chronic systolic (congestive) heart failure: Secondary | ICD-10-CM

## 2024-03-04 DIAGNOSIS — I10 Essential (primary) hypertension: Secondary | ICD-10-CM

## 2024-03-04 DIAGNOSIS — Z952 Presence of prosthetic heart valve: Secondary | ICD-10-CM

## 2024-03-04 LAB — BASIC METABOLIC PANEL WITH GFR
BUN/Creatinine Ratio: 21 (ref 12–28)
BUN: 15 mg/dL (ref 8–27)
CO2: 21 mmol/L (ref 20–29)
Calcium: 10.3 mg/dL (ref 8.7–10.3)
Chloride: 104 mmol/L (ref 96–106)
Creatinine, Ser: 0.7 mg/dL (ref 0.57–1.00)
Glucose: 97 mg/dL (ref 70–99)
Potassium: 4.5 mmol/L (ref 3.5–5.2)
Sodium: 141 mmol/L (ref 134–144)
eGFR: 94 mL/min/1.73

## 2024-03-04 MED ORDER — AMOXICILLIN 500 MG PO TABS: 2000.0000 mg | ORAL_TABLET | ORAL | 12 refills | Status: AC

## 2024-03-04 NOTE — Patient Instructions (Addendum)
 Medication Instructions:  Your physician has recommended you make the following change in your medication:  START Amoxicillin  500 mg, take 4 tablets by mouth 1 hour prior to dental procedures and cleanings.    *If you need a refill on your cardiac medications before your next appointment, please call your pharmacy*  Lab Work: TODAY, 03/04/2024 BMET If you have labs (blood work) drawn today and your tests are completely normal, you will receive your results only by: MyChart Message (if you have MyChart) OR A paper copy in the mail If you have any lab test that is abnormal or we need to change your treatment, we will call you to review the results.  Testing/Procedures: 03/18/2024 Your physician has requested that you have an echocardiogram. Echocardiography is a painless test that uses sound waves to create images of your heart. It provides your doctor with information about the size and shape of your heart and how well your hearts chambers and valves are working. This procedure takes approximately one hour. There are no restrictions for this procedure. Please do NOT wear cologne, perfume, aftershave, or lotions (deodorant is allowed). Please arrive 15 minutes prior to your appointment time.  Please note: We ask at that you not bring children with you during ultrasound (echo/ vascular) testing. Due to room size and safety concerns, children are not allowed in the ultrasound rooms during exams. Our front office staff cannot provide observation of children in our lobby area while testing is being conducted. An adult accompanying a patient to their appointment will only be allowed in the ultrasound room at the discretion of the ultrasound technician under special circumstances. We apologize for any inconvenience.   Follow-Up: At [facility], you and your health needs are our priority.  As part of our continuing mission to provide you with exceptional heart care, our providers are all part of  one team.  This team includes your primary Cardiologist (physician) and Advanced Practice Providers or APPs (Physician Assistants and Nurse Practitioners) who all work together to provide you with the care you need, when you need it.  Your next appointment:   As scheduled on 03/21/2024  Provider:   Izetta Hummer, PA-C  We recommend signing up for the patient portal called MyChart.  Sign up information is provided on this After Visit Summary.  MyChart is used to connect with patients for Virtual Visits (Telemedicine).  Patients are able to view lab/test results, encounter notes, upcoming appointments, etc.  Non-urgent messages can be sent to your provider as well.   To learn more about what you can do with MyChart, go to [email or web address].

## 2024-03-04 NOTE — Progress Notes (Signed)
 " [facility]ENTER   MULTIDISCIPLINARY HEART VALVE CLINIC                                     Cardiology Office Note:    Date:  03/04/2024   ID:  [name], DOB 16-Dec-1955, MRN [id number]  PCP:  Peck, Joanna [name], PA-C  [facility] Cardiologist:  Joanna Bihari, MD  [facility] Structural heart: Joanna Fell, MD [facility] Electrophysiologist:  None   Referring MD: Peck, Joanna [name], PA-C   TOC s/p TAVR  History of Present Illness:    [name] is a 69 y.o. female with a hx of breast cancer s/p lumpectomy, NICM, chronic combined systolic and diastolic CHF, HTN, HLD, peripheral neuropathy, LBBB, OSA on CPAP, former tobacco abuse, DMT2, morbid obesity (BMI 37), and severe aortic stenosis s/p TAVR (02/20/24) who presents to clinic for follow up.   She was seen in the structural heart clinic in 03/2023 for workup of her aortic stenosis. She had a history of reduced ejection fraction of 30% dating back to 2015. Cardiac catheterization at that time showed no evidence of coronary disease. She has been followed by cardiology with moderate aortic stenosis. Echo 01/02/24 showed EF 35-40% and LFLG AS with mean grad 24.5 mmHg, Vmax 3.30 m/s, AVA 0.68 cm2, DVI 0.24, SVI 25. L/RHC 01/31/24 showed mild nonobstructive coronary disease with elevated right heart pressures. Lasix  was increased for several days. 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 S/p TAVR with a 23 mm Edwards Sapien 3 Ultra Resilia THV via the TF approach on 02/20/24. Post operative echo showed EF 40-45%, mild MR, normally functioning TAVR with a mean gradient of 12 mmHg and no PVL. LVEDP 29 mm hg at the time of TAVR and treated with IV Lasix . Discharged home with Lasix  80mg  daily x 3 days and then 40mg  daily.   Today the patient presents to clinic for follow up. Her with her friend [name]. No  CP or SOB. No LE edema, orthopnea or PND. No dizziness or syncope. No blood in stool or urine. No palpitations. Her hands used to have a lot of pain and that improved after TAVR.    Past Medical History:  Diagnosis Date   Anxiety    Arthritis    Breast cancer (HCC) 1996   left breast   Carpal tunnel syndrome    Cataract    Chronic diastolic CHF (congestive heart failure) (HCC)    NYHA class II   COVID-19    12/02/21   Diabetes mellitus without complication (HCC)    type 2   Hyperlipidemia    Hypertension    Left bundle branch block (LBBB)    Low back pain    Neuropathy    NICM (nonischemic cardiomyopathy) (HCC)    EF 30%, cath 05/20/2015 clean coronary.  EF resolved by echo with EF 55%   OSA on CPAP    Prediabetes    Pulmonary HTN (HCC) 05/2015   Severe with PASP 67/71mmHg - resolved on followup echo   PVC's (premature ventricular contractions) 11/04/2015   S/P TAVR (transcatheter aortic valve replacement) 02/20/2024   S/p TAVR with a 23 mm Edwards Sapien 3 Ultra Resilia THV via the TF approach by Dr. Fell and Dr. Lucas  Severe aortic stenosis    Sleep apnea    cpap     Current Medications: Active Medications[1]    ROS:   Please see the history of present illness.    All other systems reviewed and are negative.  EKGs   EKG Interpretation Date/Time:  Monday March 04 2024 10:34:25 EST Ventricular Rate:  66 PR Interval:  430 QRS Duration:  68 QT Interval:  144 QTC Calculation: 151 R Axis:   65  Text Interpretation: Sinus tachycardia with 1st degree A-V block with frequent Premature ventricular complexes and Fusion complexes Low voltage QRS Possible Anterolateral infarct (cited on or before 16-Feb-2024) ST & T wave abnormality, consider inferior ischemia Confirmed by [name] [phone]) on 03/04/2024 10:47:02 AM   Risk Assessment/Calculations:           Physical Exam:    VS:  BP (!) 98/58   Pulse (!) 131   Ht 5' 4 (1.626 m)   Wt 215 lb (97.5  kg)   SpO2 97%   BMI 36.90 kg/m     Wt Readings from Last 3 Encounters:  03/04/24 215 lb (97.5 kg)  02/22/24 217 lb (98.4 kg)  02/21/24 217 lb 6 oz (98.6 kg)     GEN: Well nourished, well developed in no acute distress NECK: No JVD CARDIAC: RRR, no murmurs, rubs, gallops RESPIRATORY:  Clear to auscultation without rales, wheezing or rhonchi  ABDOMEN: Soft, non-tender, non-distended EXTREMITIES:  No edema; No deformity.  Groin sites clear without hematoma or ecchymosis.   ASSESSMENT:    1. S/P TAVR (transcatheter aortic valve replacement)   2. Chronic heart failure with mildly reduced ejection fraction (HFmrEF) (HCC)   3. Primary hypertension     PLAN:    In order of problems listed above:  Severe AS s/p TAVR:  -- Pt doing excellent s/p TAVR.  -- ECG with no HAVB.  -- Groin sites healing well.  -- SBE prophylaxis discussed; I have RX'd amoxicillin .  -- Continue Aspirin  81mg  daily. -- Cleared to resume all activities without restriction. -- I will see back for 1 month echo and OV. 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   Chronic HFmrEF: -- NICM.  -- EF 40-45%. -- Continue Lasix  40mg  daily. -- Continue Coreg  6.25mg  BID, Entresto  24-26mg  BID, and spiro 12.5 mg daily. Had yeast infections with SGLT2s.  -- Check BMET today.  HTN: -- BP on soft side today- asymptomatic.   -- No changes made today.  -- BP treated in the context of CHF GDMT.     Medication Adjustments/Labs and Tests Ordered: Current medicines are reviewed at length with the patient today.  Concerns regarding medicines are outlined above.  Orders Placed This Encounter  Procedures   Basic Metabolic Panel (BMET)   EKG 12-Lead   ECHOCARDIOGRAM COMPLETE   Meds ordered this encounter  Medications   amoxicillin  (AMOXIL ) 500 MG tablet    Sig: Take 4 tablets (2,000 mg total) by mouth as directed. 1 hour prior to dental work including cleanings    Dispense:  12 tablet     Refill:  12    Supervising Provider:   WONDA Peck [[id number]]    Patient Instructions  Medication Instructions:  Your physician has recommended you make the following change in your medication:  START Amoxicillin  500 mg, take 4 tablets by mouth 1 hour prior to dental procedures and cleanings.    *If you need a refill on your cardiac medications before  your next appointment, please call your pharmacy*  Lab Work: TODAY, 03/04/2024 BMET If you have labs (blood work) drawn today and your tests are completely normal, you will receive your results only by: MyChart Message (if you have MyChart) OR A paper copy in the mail If you have any lab test that is abnormal or we need to change your treatment, we will call you to review the results.  Testing/Procedures: 03/18/2024 Your physician has requested that you have an echocardiogram. Echocardiography is a painless test that uses sound waves to create images of your heart. It provides your doctor with information about the size and shape of your heart and how well your hearts chambers and valves are working. This procedure takes approximately one hour. There are no restrictions for this procedure. Please do NOT wear cologne, perfume, aftershave, or lotions (deodorant is allowed). Please arrive 15 minutes prior to your appointment time.  Please note: We ask at that you not bring children with you during ultrasound (echo/ vascular) testing. Due to room size and safety concerns, children are not allowed in the ultrasound rooms during exams. Our front office staff cannot provide observation of children in our lobby area while testing is being conducted. An adult accompanying a patient to their appointment will only be allowed in the ultrasound room at the discretion of the ultrasound technician under special circumstances. We apologize for any inconvenience.   Follow-Up: At [facility], you and your health needs are our priority.  As part of our  continuing mission to provide you with exceptional heart care, our providers are all part of one team.  This team includes your primary Cardiologist (physician) and Advanced Practice Providers or APPs (Physician Assistants and Nurse Practitioners) who all work together to provide you with the care you need, when you need it.  Your next appointment:   As scheduled on 03/21/2024  Provider:   Izetta Hummer, PA-C  We recommend signing up for the patient portal called MyChart.  Sign up information is provided on this After Visit Summary.  MyChart is used to connect with patients for Virtual Visits (Telemedicine).  Patients are able to view lab/test results, encounter notes, upcoming appointments, etc.  Non-urgent messages can be sent to your provider as well.   To learn more about what you can do with MyChart, go to [email or web address].          Signed, Lamarr Hummer, PA-C  03/04/2024 1:51 PM    [facility]     [1]  Current Meds  Medication Sig   Accu-Chek Softclix Lancets lancets USE 1 STRIP TO CHECK BLOOD SUGAR 2-3 TIMES DAILY   acetaminophen  (TYLENOL ) 650 MG CR tablet Take 1,300 mg by mouth 2 (two) times daily as needed for pain.   amoxicillin  (AMOXIL ) 500 MG tablet Take 4 tablets (2,000 mg total) by mouth as directed. 1 hour prior to dental work including cleanings   Ascorbic Acid (VITAMIN C PO) Take 2,000 mg by mouth daily.   aspirin  81 MG tablet Take 81 mg by mouth daily.   carvedilol  (COREG ) 6.25 MG tablet Take 1 tablet (6.25 mg total) by mouth 2 (two) times daily with a meal.   cyanocobalamin  (VITAMIN B12) 1000 MCG tablet Take 1,000 mcg by mouth daily.   diphenhydrAMINE (BENADRYL) 25 MG tablet Take 25 mg by mouth at bedtime as needed (sinus irritation, runny nose when using CPAP).   ezetimibe  (ZETIA ) 10 MG tablet Take 1 tablet (10 mg total) by mouth  daily.   furosemide  (LASIX ) 40 MG tablet Take 80mg  x 3 days and then go down to 40mg  daily. Can take  an extra 40mg  if need for fluid retention   Glucosamine-Chondroitin-MSM (GLUCOSAMINE CHONDROIT MSM DS PO) Take 2 tablets by mouth daily.   glucose blood (ACCU-CHEK GUIDE TEST) test strip USE TO CHECK BLOOD SUGAR 2-3 TIMES DAILY   metFORMIN  (GLUCOPHAGE -XR) 500 MG 24 hr tablet 3 tabs qd   Multiple Vitamins-Minerals (MULTIVITAMIN WOMEN 50+) TABS Take 1 tablet by mouth daily.   pregabalin  (LYRICA ) 50 MG capsule Take 1 capsule by mouth at bedtime   rosuvastatin  (CRESTOR ) 5 MG tablet Take 1 tablet by mouth once daily   sacubitril -valsartan  (ENTRESTO ) 24-26 MG Take 1 tablet by mouth 2 (two) times daily.   Semaglutide ,0.25 or 0.5MG /DOS, (OZEMPIC , 0.25 OR 0.5 MG/DOSE,) 2 MG/3ML SOPN Take 2.5 mg weekly for 4 weeks and increase to 0.5 mg weekly.   sodium chloride  (OCEAN) 0.65 % SOLN nasal spray Place 1-2 sprays into both nostrils as needed (sinus irritation, nasal dryness).   spironolactone  (ALDACTONE ) 25 MG tablet Take 1/2 (one-half) tablet by mouth once daily   "

## 2024-03-05 ENCOUNTER — Telehealth: Payer: Self-pay | Admitting: *Deleted

## 2024-03-05 ENCOUNTER — Ambulatory Visit: Payer: Self-pay | Admitting: Physician Assistant

## 2024-03-05 NOTE — Telephone Encounter (Signed)
-----   Message from [name], MD sent at 03/05/2024 10:48 AM EST ----- Brad I need you to add it to this message trail ----- Message ----- From: Joshua Dalton MATSU, CMA Sent: 03/05/2024  10:35 AM EST To: Wilbert JONELLE Bihari, MD  I tried to send it to you. I hope you got it. ----- Message ----- From: Bihari Wilbert JONELLE, MD Sent: 03/05/2024   9:01 AM EST To: Dalton MATSU Joshua, CMA; Lamarr JONELLE Hummer, P#  Can you scan it into this message ----- Message ----- From: Joshua Dalton MATSU, CMA Sent: 03/05/2024   8:39 AM EST To: Wilbert JONELLE Bihari, MD  Yes printed. ----- Message ----- From: Bihari Wilbert JONELLE, MD Sent: 03/04/2024   2:12 PM EST To: Lamarr JONELLE Hummer, PA-C; Cv Div Sleep Studi#  Brad can you make a print out of her last PCP download ----- Message ----- From: Hummer Lamarr JONELLE, PA-C Sent: 03/04/2024   1:55 PM EST To: Wilbert JONELLE Bihari, MD  Hey! This is your regular pt. She is doing great after TAVR. EF improved to 40-45%. 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 She also need a download of her sleep study reports. I am not sure. How would I get those for her?  Thank you!  KT

## 2024-03-05 NOTE — Telephone Encounter (Signed)
 One year compliance Report [name], [name] 03/06/2023 - 03/04/2024 Patient ID: [id number] DOB: 24-Dec-1955 Age: 69 years 30.5 [place] (Therapist) [address] [place] , [address] Compliance Report Compliance Payor Standard Usage 03/06/2023 - 03/04/2024 Usage days 340/365 days (93%) >= 4 hours 301 days (82%) < 4 hours 39 days (11%) Usage hours 2,175 hours 19 minutes Average usage (total days) 5 hours 58 minutes Average usage (days used) 6 hours 24 minutes Median usage (days used) 6 hours 21 minutes Total used hours (value since last reset - 03/04/2024) 14,237 hours AirSense 10 AutoSet Serial number [id number] Mode CPAP Set pressure 13 cmH2O EPR Off Therapy Leaks - L/min Median: 53.5 95th percentile: 88.7 Maximum: 97.8 Events per hour AI: 1.8 HI: 0.7 AHI: 2.5 Apnea Index Central: 0.1 Obstructive: 0.6 Unknown: 1.1 RERA Index 0.2 Cheyne-Stokes respiration (average duration per night) 0 minutes (0%) Usage - hours Printed on 03/05/2024 - ResMed AirView version 4.50.0-6.0 Page 1 of 1  [name], [name] 03/06/2023 - 03/04/2024 Patient ID: [id number] DOB: 1955/05/14 Age: 69 years 30.5 [place] (Therapist) [address] [facility] [place] , [address] Therapy Report AirSense 10 AutoSet SN: [id number] Usage (hours) Usage days 340/365 >= 4 hour days 301 (82%) < 4 hour days 39 (11%) Days not used 25 (7%) Days no data 0 (0%) Used/day (avg.) 6.4 hrs. Leak (L/min) Set threshold 24.0 L/min Maximum (avg) 97.8 95th % (avg) 88.7 Median (avg) 53.5 Pressure (cmH2O) Mode CPAP Set EPR Off Set pressure 13.0 AHI (events/hour) AHI 2.5 HI 0.7 AI 1.8 CAI 0.1 OAI 0.6 UAI 1.1 RERA 0.2 CSR% (avg) 0.0 Printed on 03/05/2024 - ResMed AirView version 4.50.0-6.0 Page 1 of 1

## 2024-03-07 ENCOUNTER — Encounter

## 2024-03-07 NOTE — Telephone Encounter (Signed)
 Per dr Shlomo Plum looks great   Patient notified via her mychart

## 2024-03-18 ENCOUNTER — Ambulatory Visit (HOSPITAL_COMMUNITY): Admission: RE | Admit: 2024-03-18 | Discharge: 2024-03-18 | Attending: Physician Assistant

## 2024-03-18 ENCOUNTER — Ambulatory Visit: Admitting: Physician Assistant

## 2024-03-18 ENCOUNTER — Ambulatory Visit (HOSPITAL_COMMUNITY)

## 2024-03-18 DIAGNOSIS — Z952 Presence of prosthetic heart valve: Secondary | ICD-10-CM

## 2024-03-19 LAB — ECHOCARDIOGRAM COMPLETE
AR max vel: 2.43 cm2
AV Area VTI: 2.21 cm2
AV Area mean vel: 2.24 cm2
AV Mean grad: 15 mmHg
AV Peak grad: 25.4 mmHg
Ao pk vel: 2.52 m/s
Area-P 1/2: 3.3 cm2
Calc EF: 44.3 %
MV VTI: 2.69 cm2
S' Lateral: 4.1 cm
Single Plane A2C EF: 43.6 %
Single Plane A4C EF: 44.7 %

## 2024-03-20 NOTE — Progress Notes (Unsigned)
 " [facility]   MULTIDISCIPLINARY HEART VALVE CLINIC                                     Cardiology Office Note:    Date:  03/21/2024   ID:  CIERRIA Peck, DOB 23-May-1955, MRN [id number]  PCP:  [name], [name] [name], PA-C  [facility] Cardiologist:  [name], MD  [facility] Structural heart: Ozell Fell, MD [facility] Electrophysiologist:  None   Referring MD: [name], [name] [name], PA-C   1 month s/p TAVR  History of Present Illness:    [name] is a 69 y.o. female with a hx of breast cancer s/p lumpectomy, NICM, chronic combined systolic and diastolic CHF, HTN, HLD, peripheral neuropathy, LBBB, OSA on CPAP, former tobacco abuse, DMT2, morbid obesity (BMI 37), and severe aortic stenosis s/p TAVR (02/20/24) who presents to clinic for follow up.   She was seen in the structural heart clinic in 03/2023 for workup of her aortic stenosis. She had a history of reduced ejection fraction of 30% dating back to 2015. Cardiac catheterization at that time showed no evidence of coronary disease. She has been followed by cardiology with moderate aortic stenosis. Echo 01/02/24 showed EF 35-40% and LFLG AS with mean grad 24.5 mmHg, Vmax 3.30 m/s, AVA 0.68 cm2, DVI 0.24, SVI 25. L/RHC 01/31/24 showed mild nonobstructive coronary disease with elevated right heart pressures. Lasix  was increased for several days. 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 S/p TAVR with a 23 mm Edwards Sapien 3 Ultra Resilia THV via the TF approach on 02/20/24. Post operative echo showed EF 40-45%, mild MR, normally functioning TAVR with a mean gradient of 12 mmHg and no PVL. LVEDP 29 mm hg at the time of TAVR and treated with IV Lasix . Discharged home with Lasix  80mg  daily x 3 days and then 40mg  daily.   Echo 03/19/24 showed EF 40-45%, G2DD, normally functioning TAVR with a mean gradient  of 15 mmHg and no PVL.   Today the patient presents to clinic for follow up. Her with her friend [place]. No CP or SOB. No LE edema, orthopnea or PND. No dizziness or syncope. No blood in stool or urine. No palpitations. She has been very thankful for the care she has received at [facility]. She does have some trouble with her CPAP and nose burning.     Past Medical History:  Diagnosis Date   Anxiety    Arthritis    Breast cancer (HCC) 1996   left breast   Carpal tunnel syndrome    Cataract    Chronic diastolic CHF (congestive heart failure) (HCC)    NYHA class II   COVID-19    12/02/21   Diabetes mellitus without complication (HCC)    type 2   Hyperlipidemia    Hypertension    Left bundle branch block (LBBB)    Low back pain    Neuropathy    NICM (nonischemic cardiomyopathy) (HCC)    EF 30%, cath 05/20/2015 clean coronary.  EF resolved by echo with EF 55%   OSA on CPAP    Prediabetes    Pulmonary HTN (HCC) 05/2015   Severe with PASP 67/4mmHg - resolved on followup echo   PVC's (premature ventricular contractions) 11/04/2015  S/P TAVR (transcatheter aortic valve replacement) 02/20/2024   S/p TAVR with a 23 mm Edwards Sapien 3 Ultra Resilia THV via the TF approach by Dr. Wonda and Dr. Lucas   Severe aortic stenosis    Sleep apnea    cpap     Current Medications: Active Medications[1]    ROS:   Please see the history of present illness.    All other systems reviewed and are negative.  EKGs       Risk Assessment/Calculations:           Physical Exam:    VS:  BP 118/70   Pulse 79   Ht 5' 4 (1.626 m)   Wt 224 lb 9.6 oz (101.9 kg)   SpO2 98%   BMI 38.55 kg/m     Wt Readings from Last 3 Encounters:  03/21/24 224 lb 9.6 oz (101.9 kg)  03/04/24 215 lb (97.5 kg)  02/22/24 217 lb (98.4 kg)     GEN: Well nourished, well developed in no acute distress NECK: No JVD CARDIAC: RRR, no murmurs, rubs, gallops RESPIRATORY:  Clear to auscultation without rales,  wheezing or rhonchi  ABDOMEN: Soft, non-tender, non-distended EXTREMITIES:  No edema; No deformity.   ASSESSMENT:    1. S/P TAVR (transcatheter aortic valve replacement)   2. Chronic heart failure with mildly reduced ejection fraction (HFmrEF) (HCC)   3. Primary hypertension   4. OSA on CPAP     PLAN:    In order of problems listed above:  Severe AS s/p TAVR:  -- Echo 03/19/24 showed EF 40-45%, G2DD, normally functioning TAVR with a mean gradient of 15 mmHg and no PVL.  -- NYHA class I symptoms with a marked improvement in symptoms since TAVR.  -- SBE prophylaxis discussed; she has amoxicillin .  -- Continue Aspirin  81mg  daily. 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    Chronic HFmrEF: -- NICM.  -- EF 40-45%. -- Continue Lasix  40mg  daily. -- Continue Coreg  6.25mg  BID, Entresto  24-26mg  BID, and spiro 12.5 mg daily. Increase spironolactone  to 25mg  daily.  -- BMET in 2 weeks.  -- Had yeast infections with SGLT2s.   HTN: -- BP118/70 today. .  -- BP treated in the context of CHF GDMT.   OSA: -- Continue CPAP. -- Having trouble with her mask w/ nose burning. Will have sleep nurse reach out to her.   Medication Adjustments/Labs and Tests Ordered: Current medicines are reviewed at length with the patient today.  Concerns regarding medicines are outlined above.  Orders Placed This Encounter  Procedures   Basic Metabolic Panel (BMET)   ECHOCARDIOGRAM COMPLETE   Meds ordered this encounter  Medications   spironolactone  (ALDACTONE ) 25 MG tablet    Sig: Take 1 tablet (25 mg total) by mouth daily.    Dispense:  90 tablet    Refill:  3    Supervising Provider:   WONDA SHARPER [[id number]]    Patient Instructions  Medication Instructions:  Your physician has recommended you make the following change in your medication:  INCREASE spironolactone  to 25mg  (a whole tablet) daily.   *If you need a refill on your cardiac medications before  your next appointment, please call your pharmacy*  Lab Work: IN 2 WEEKS (04/04/2024) BMET If you have labs (blood work) drawn today and your tests are completely normal, you will receive your results only by: MyChart Message (if you have MyChart) OR A paper copy in the mail  If you have any lab test that is abnormal or we need to change your treatment, we will call you to review the results.  Testing/Procedures: 02/20/2025 Your physician has requested that you have an echocardiogram. Echocardiography is a painless test that uses sound waves to create images of your heart. It provides your doctor with information about the size and shape of your heart and how well your hearts chambers and valves are working. This procedure takes approximately one hour. There are no restrictions for this procedure. Please do NOT wear cologne, perfume, aftershave, or lotions (deodorant is allowed). Please arrive 15 minutes prior to your appointment time.  Please note: We ask at that you not bring children with you during ultrasound (echo/ vascular) testing. Due to room size and safety concerns, children are not allowed in the ultrasound rooms during exams. Our front office staff cannot provide observation of children in our lobby area while testing is being conducted. An adult accompanying a patient to their appointment will only be allowed in the ultrasound room at the discretion of the ultrasound technician under special circumstances. We apologize for any inconvenience.   Follow-Up: At [facility], you and your health needs are our priority.  As part of our continuing mission to provide you with exceptional heart care, our providers are all part of one team.  This team includes your primary Cardiologist (physician) and Advanced Practice Providers or APPs (Physician Assistants and Nurse Practitioners) who all work together to provide you with the care you need, when you need it.  Your next appointment:   As  scheduled on 07/29/2024  Provider:   Dr. [name]  We recommend signing up for the patient portal called MyChart.  Sign up information is provided on this After Visit Summary.  MyChart is used to connect with patients for Virtual Visits (Telemedicine).  Patients are able to view lab/test results, encounter notes, upcoming appointments, etc.  Non-urgent messages can be sent to your provider as well.   To learn more about what you can do with MyChart, go to [email or web address].          Signed, Lamarr Hummer, PA-C  03/21/2024 2:40 PM    [facility]     [1]  No outpatient medications have been marked as taking for the 03/21/24 encounter (Office Visit) with Hummer Lamarr SAUNDERS, PA-C.   "

## 2024-03-21 ENCOUNTER — Other Ambulatory Visit: Payer: Self-pay

## 2024-03-21 ENCOUNTER — Telehealth: Payer: Self-pay | Admitting: Physician Assistant

## 2024-03-21 ENCOUNTER — Ambulatory Visit: Admitting: Physician Assistant

## 2024-03-21 ENCOUNTER — Encounter: Payer: Self-pay | Admitting: Physician Assistant

## 2024-03-21 VITALS — BP 118/70 | HR 79 | Ht 64.0 in | Wt 224.6 lb

## 2024-03-21 DIAGNOSIS — I5022 Chronic systolic (congestive) heart failure: Secondary | ICD-10-CM

## 2024-03-21 DIAGNOSIS — Z952 Presence of prosthetic heart valve: Secondary | ICD-10-CM

## 2024-03-21 DIAGNOSIS — G4733 Obstructive sleep apnea (adult) (pediatric): Secondary | ICD-10-CM | POA: Diagnosis not present

## 2024-03-21 DIAGNOSIS — I10 Essential (primary) hypertension: Secondary | ICD-10-CM

## 2024-03-21 MED ORDER — SPIRONOLACTONE 25 MG PO TABS: 25.0000 mg | ORAL_TABLET | Freq: Every day | ORAL | 3 refills | Status: AC

## 2024-03-21 NOTE — Telephone Encounter (Signed)
 We received a Short Term Disability form from [employer] [place] [employer].  Patient signed the Release of Information and paid $29 fee.  Form is with K. Sebastian.

## 2024-03-21 NOTE — Patient Instructions (Signed)
 Medication Instructions:  Your physician has recommended you make the following change in your medication:  INCREASE spironolactone  to 25mg  (a whole tablet) daily.   *If you need a refill on your cardiac medications before your next appointment, please call your pharmacy*  Lab Work: IN 2 WEEKS (04/04/2024) BMET If you have labs (blood work) drawn today and your tests are completely normal, you will receive your results only by: MyChart Message (if you have MyChart) OR A paper copy in the mail If you have any lab test that is abnormal or we need to change your treatment, we will call you to review the results.  Testing/Procedures: 02/20/2025 Your physician has requested that you have an echocardiogram. Echocardiography is a painless test that uses sound waves to create images of your heart. It provides your doctor with information about the size and shape of your heart and how well your hearts chambers and valves are working. This procedure takes approximately one hour. There are no restrictions for this procedure. Please do NOT wear cologne, perfume, aftershave, or lotions (deodorant is allowed). Please arrive 15 minutes prior to your appointment time.  Please note: We ask at that you not bring children with you during ultrasound (echo/ vascular) testing. Due to room size and safety concerns, children are not allowed in the ultrasound rooms during exams. Our front office staff cannot provide observation of children in our lobby area while testing is being conducted. An adult accompanying a patient to their appointment will only be allowed in the ultrasound room at the discretion of the ultrasound technician under special circumstances. We apologize for any inconvenience.   Follow-Up: At [facility], you and your health needs are our priority.  As part of our continuing mission to provide you with exceptional heart care, our providers are all part of one team.  This team includes your  primary Cardiologist (physician) and Advanced Practice Providers or APPs (Physician Assistants and Nurse Practitioners) who all work together to provide you with the care you need, when you need it.  Your next appointment:   As scheduled on 07/29/2024  Provider:   Dr. [name]  We recommend signing up for the patient portal called MyChart.  Sign up information is provided on this After Visit Summary.  MyChart is used to connect with patients for Virtual Visits (Telemedicine).  Patients are able to view lab/test results, encounter notes, upcoming appointments, etc.  Non-urgent messages can be sent to your provider as well.   To learn more about what you can do with MyChart, go to [email or web address].

## 2024-03-22 NOTE — Telephone Encounter (Signed)
 Short Term Disability form faxed to [employer] and scanned into chart.  Billing notified.

## 2024-04-15 ENCOUNTER — Other Ambulatory Visit

## 2024-05-29 ENCOUNTER — Ambulatory Visit: Admitting: Endocrinology

## 2024-06-04 ENCOUNTER — Ambulatory Visit: Admitting: Physician Assistant

## 2024-07-02 ENCOUNTER — Ambulatory Visit

## 2024-07-29 ENCOUNTER — Ambulatory Visit: Admitting: Cardiology

## 2025-02-20 ENCOUNTER — Ambulatory Visit: Admitting: Physician Assistant

## 2025-02-20 ENCOUNTER — Ambulatory Visit (HOSPITAL_COMMUNITY)
